# Patient Record
Sex: Female | Born: 1940
Health system: Southern US, Community
[De-identification: ages and names within clinical notes are randomized; demographics above are authoritative.]

## PROBLEM LIST (undated history)

## (undated) DIAGNOSIS — K2980 Duodenitis without bleeding: Secondary | ICD-10-CM

## (undated) DIAGNOSIS — D126 Benign neoplasm of colon, unspecified: Secondary | ICD-10-CM

## (undated) DIAGNOSIS — E8881 Metabolic syndrome: Secondary | ICD-10-CM

## (undated) DIAGNOSIS — I10 Essential (primary) hypertension: Secondary | ICD-10-CM

## (undated) DIAGNOSIS — K219 Gastro-esophageal reflux disease without esophagitis: Secondary | ICD-10-CM

## (undated) DIAGNOSIS — R0989 Other specified symptoms and signs involving the circulatory and respiratory systems: Secondary | ICD-10-CM

## (undated) DIAGNOSIS — F329 Major depressive disorder, single episode, unspecified: Secondary | ICD-10-CM

## (undated) DIAGNOSIS — N189 Chronic kidney disease, unspecified: Secondary | ICD-10-CM

## (undated) DIAGNOSIS — IMO0002 Reserved for concepts with insufficient information to code with codable children: Secondary | ICD-10-CM

## (undated) DIAGNOSIS — K635 Polyp of colon: Secondary | ICD-10-CM

## (undated) DIAGNOSIS — M8580 Other specified disorders of bone density and structure, unspecified site: Secondary | ICD-10-CM

## (undated) DIAGNOSIS — K5790 Diverticulosis of intestine, part unspecified, without perforation or abscess without bleeding: Secondary | ICD-10-CM

## (undated) DIAGNOSIS — E785 Hyperlipidemia, unspecified: Secondary | ICD-10-CM

## (undated) DIAGNOSIS — J302 Other seasonal allergic rhinitis: Secondary | ICD-10-CM

## (undated) DIAGNOSIS — H269 Unspecified cataract: Secondary | ICD-10-CM

## (undated) DIAGNOSIS — E6609 Other obesity due to excess calories: Secondary | ICD-10-CM

## (undated) DIAGNOSIS — M1990 Unspecified osteoarthritis, unspecified site: Secondary | ICD-10-CM

## (undated) DIAGNOSIS — F419 Anxiety disorder, unspecified: Secondary | ICD-10-CM

## (undated) DIAGNOSIS — T7840XA Allergy, unspecified, initial encounter: Secondary | ICD-10-CM

## (undated) DIAGNOSIS — F32A Depression, unspecified: Secondary | ICD-10-CM

## (undated) DIAGNOSIS — E119 Type 2 diabetes mellitus without complications: Secondary | ICD-10-CM

## (undated) DIAGNOSIS — K259 Gastric ulcer, unspecified as acute or chronic, without hemorrhage or perforation: Secondary | ICD-10-CM

## (undated) DIAGNOSIS — E1165 Type 2 diabetes mellitus with hyperglycemia: Secondary | ICD-10-CM

## (undated) DIAGNOSIS — D649 Anemia, unspecified: Secondary | ICD-10-CM

## (undated) HISTORY — DX: Anemia, unspecified: D64.9

## (undated) HISTORY — DX: Unspecified cataract: H26.9

## (undated) HISTORY — DX: Diverticulosis of intestine, part unspecified, without perforation or abscess without bleeding: K57.90

## (undated) HISTORY — DX: Gastro-esophageal reflux disease without esophagitis: K21.9

## (undated) HISTORY — DX: Metabolic syndrome: E88.810

## (undated) HISTORY — DX: Major depressive disorder, single episode, unspecified: F32.9

## (undated) HISTORY — DX: Essential (primary) hypertension: I10

## (undated) HISTORY — PX: HAND SURGERY: SHX662

## (undated) HISTORY — DX: Type 2 diabetes mellitus with hyperglycemia: E11.65

## (undated) HISTORY — DX: Polyp of colon: K63.5

## (undated) HISTORY — DX: Other specified symptoms and signs involving the circulatory and respiratory systems: R09.89

## (undated) HISTORY — DX: Other obesity due to excess calories: E66.09

## (undated) HISTORY — PX: COLONOSCOPY: SHX174

## (undated) HISTORY — DX: Benign neoplasm of colon, unspecified: D12.6

## (undated) HISTORY — DX: Gastric ulcer, unspecified as acute or chronic, without hemorrhage or perforation: K25.9

## (undated) HISTORY — DX: Chronic kidney disease, unspecified: N18.9

## (undated) HISTORY — DX: Hyperlipidemia, unspecified: E78.5

## (undated) HISTORY — DX: Anxiety disorder, unspecified: F41.9

## (undated) HISTORY — DX: Other specified disorders of bone density and structure, unspecified site: M85.80

## (undated) HISTORY — DX: Duodenitis without bleeding: K29.80

## (undated) HISTORY — PX: POLYPECTOMY: SHX149

## (undated) HISTORY — DX: Other seasonal allergic rhinitis: J30.2

## (undated) HISTORY — PX: TUBAL LIGATION: SHX77

## (undated) HISTORY — PX: OTHER SURGICAL HISTORY: SHX169

## (undated) HISTORY — DX: Unspecified osteoarthritis, unspecified site: M19.90

## (undated) HISTORY — DX: Metabolic syndrome: E88.81

## (undated) HISTORY — PX: DILATION AND CURETTAGE OF UTERUS: SHX78

## (undated) HISTORY — DX: Depression, unspecified: F32.A

## (undated) HISTORY — DX: Allergy, unspecified, initial encounter: T78.40XA

## (undated) HISTORY — DX: Type 2 diabetes mellitus without complications: E11.9

## (undated) HISTORY — PX: FOOT SURGERY: SHX648

## (undated) HISTORY — DX: Reserved for concepts with insufficient information to code with codable children: IMO0002

---

## 1995-03-22 HISTORY — PX: DILATION AND CURETTAGE OF UTERUS: SHX78

## 1998-07-09 ENCOUNTER — Other Ambulatory Visit: Admission: RE | Admit: 1998-07-09 | Discharge: 1998-07-09 | Payer: Self-pay | Admitting: Obstetrics and Gynecology

## 1998-09-11 ENCOUNTER — Ambulatory Visit (HOSPITAL_COMMUNITY): Admission: RE | Admit: 1998-09-11 | Discharge: 1998-09-11 | Payer: Self-pay | Admitting: Obstetrics and Gynecology

## 1999-03-31 ENCOUNTER — Encounter: Admission: RE | Admit: 1999-03-31 | Discharge: 1999-03-31 | Payer: Self-pay | Admitting: *Deleted

## 1999-03-31 ENCOUNTER — Encounter: Payer: Self-pay | Admitting: *Deleted

## 1999-04-14 ENCOUNTER — Encounter: Admission: RE | Admit: 1999-04-14 | Discharge: 1999-04-14 | Payer: Self-pay | Admitting: *Deleted

## 1999-04-14 ENCOUNTER — Encounter: Payer: Self-pay | Admitting: *Deleted

## 2000-02-02 ENCOUNTER — Encounter: Admission: RE | Admit: 2000-02-02 | Discharge: 2000-02-02 | Payer: Self-pay | Admitting: Internal Medicine

## 2000-02-02 ENCOUNTER — Encounter: Payer: Self-pay | Admitting: Internal Medicine

## 2000-05-04 ENCOUNTER — Other Ambulatory Visit: Admission: RE | Admit: 2000-05-04 | Discharge: 2000-05-04 | Payer: Self-pay | Admitting: Obstetrics and Gynecology

## 2001-06-06 ENCOUNTER — Encounter: Payer: Self-pay | Admitting: Obstetrics and Gynecology

## 2001-06-06 ENCOUNTER — Encounter: Admission: RE | Admit: 2001-06-06 | Discharge: 2001-06-06 | Payer: Self-pay | Admitting: Obstetrics and Gynecology

## 2001-06-22 ENCOUNTER — Other Ambulatory Visit: Admission: RE | Admit: 2001-06-22 | Discharge: 2001-06-22 | Payer: Self-pay | Admitting: Obstetrics and Gynecology

## 2002-06-18 ENCOUNTER — Encounter: Admission: RE | Admit: 2002-06-18 | Discharge: 2002-06-18 | Payer: Self-pay | Admitting: Obstetrics and Gynecology

## 2002-06-18 ENCOUNTER — Encounter: Payer: Self-pay | Admitting: Obstetrics and Gynecology

## 2002-11-01 ENCOUNTER — Other Ambulatory Visit: Admission: RE | Admit: 2002-11-01 | Discharge: 2002-11-01 | Payer: Self-pay | Admitting: Obstetrics and Gynecology

## 2003-11-18 ENCOUNTER — Encounter: Admission: RE | Admit: 2003-11-18 | Discharge: 2003-11-18 | Payer: Self-pay | Admitting: Obstetrics and Gynecology

## 2005-01-07 ENCOUNTER — Encounter: Admission: RE | Admit: 2005-01-07 | Discharge: 2005-01-07 | Payer: Self-pay | Admitting: Obstetrics and Gynecology

## 2006-01-27 ENCOUNTER — Encounter: Admission: RE | Admit: 2006-01-27 | Discharge: 2006-01-27 | Payer: Self-pay | Admitting: Obstetrics and Gynecology

## 2006-12-11 ENCOUNTER — Encounter: Admission: RE | Admit: 2006-12-11 | Discharge: 2006-12-11 | Payer: Self-pay | Admitting: *Deleted

## 2007-01-30 ENCOUNTER — Encounter: Admission: RE | Admit: 2007-01-30 | Discharge: 2007-01-30 | Payer: Self-pay | Admitting: Obstetrics and Gynecology

## 2008-02-04 ENCOUNTER — Encounter: Admission: RE | Admit: 2008-02-04 | Discharge: 2008-02-04 | Payer: Self-pay | Admitting: Obstetrics and Gynecology

## 2008-05-01 ENCOUNTER — Encounter: Admission: RE | Admit: 2008-05-01 | Discharge: 2008-05-01 | Payer: Self-pay | Admitting: *Deleted

## 2008-11-10 ENCOUNTER — Encounter (INDEPENDENT_AMBULATORY_CARE_PROVIDER_SITE_OTHER): Payer: Self-pay | Admitting: *Deleted

## 2008-11-25 ENCOUNTER — Ambulatory Visit: Payer: Self-pay | Admitting: Gastroenterology

## 2008-12-15 ENCOUNTER — Encounter: Payer: Self-pay | Admitting: Gastroenterology

## 2008-12-15 ENCOUNTER — Ambulatory Visit: Payer: Self-pay | Admitting: Gastroenterology

## 2008-12-16 ENCOUNTER — Encounter: Payer: Self-pay | Admitting: Gastroenterology

## 2008-12-24 ENCOUNTER — Ambulatory Visit: Payer: Self-pay | Admitting: Vascular Surgery

## 2009-01-20 ENCOUNTER — Ambulatory Visit (HOSPITAL_BASED_OUTPATIENT_CLINIC_OR_DEPARTMENT_OTHER): Admission: RE | Admit: 2009-01-20 | Discharge: 2009-01-20 | Payer: Self-pay | Admitting: Orthopedic Surgery

## 2010-02-17 ENCOUNTER — Ambulatory Visit: Payer: Self-pay | Admitting: Cardiology

## 2010-02-18 ENCOUNTER — Ambulatory Visit: Payer: Self-pay | Admitting: Cardiology

## 2010-04-11 ENCOUNTER — Encounter: Payer: Self-pay | Admitting: *Deleted

## 2010-06-08 ENCOUNTER — Other Ambulatory Visit: Payer: Self-pay | Admitting: Dermatology

## 2010-06-24 LAB — BASIC METABOLIC PANEL
CO2: 26 mEq/L (ref 19–32)
Calcium: 9.4 mg/dL (ref 8.4–10.5)
Chloride: 104 mEq/L (ref 96–112)
Creatinine, Ser: 0.84 mg/dL (ref 0.4–1.2)
GFR calc non Af Amer: >60 mL/min (ref >60)
Glucose, Bld: 133 mg/dL — ABNORMAL HIGH (ref 70–99)
Potassium: 3.9 mEq/L (ref 3.5–5.1)

## 2010-06-30 ENCOUNTER — Ambulatory Visit: Payer: Self-pay | Admitting: Cardiology

## 2010-06-30 ENCOUNTER — Encounter: Payer: Self-pay | Admitting: *Deleted

## 2010-06-30 ENCOUNTER — Other Ambulatory Visit: Payer: Self-pay | Admitting: *Deleted

## 2010-06-30 DIAGNOSIS — E7800 Pure hypercholesterolemia, unspecified: Secondary | ICD-10-CM

## 2010-07-01 ENCOUNTER — Ambulatory Visit (INDEPENDENT_AMBULATORY_CARE_PROVIDER_SITE_OTHER): Payer: Medicare Other | Admitting: Cardiology

## 2010-07-01 ENCOUNTER — Other Ambulatory Visit (INDEPENDENT_AMBULATORY_CARE_PROVIDER_SITE_OTHER): Payer: Medicare Other | Admitting: *Deleted

## 2010-07-01 ENCOUNTER — Other Ambulatory Visit (INDEPENDENT_AMBULATORY_CARE_PROVIDER_SITE_OTHER): Payer: Medicare Other | Admitting: Cardiology

## 2010-07-01 DIAGNOSIS — F411 Generalized anxiety disorder: Secondary | ICD-10-CM

## 2010-07-01 DIAGNOSIS — I119 Hypertensive heart disease without heart failure: Secondary | ICD-10-CM | POA: Insufficient documentation

## 2010-07-01 DIAGNOSIS — R21 Rash and other nonspecific skin eruption: Secondary | ICD-10-CM

## 2010-07-01 DIAGNOSIS — E785 Hyperlipidemia, unspecified: Secondary | ICD-10-CM

## 2010-07-01 DIAGNOSIS — E8881 Metabolic syndrome: Secondary | ICD-10-CM | POA: Insufficient documentation

## 2010-07-01 DIAGNOSIS — E7800 Pure hypercholesterolemia, unspecified: Secondary | ICD-10-CM

## 2010-07-01 DIAGNOSIS — F419 Anxiety disorder, unspecified: Secondary | ICD-10-CM | POA: Insufficient documentation

## 2010-07-01 LAB — LIPID PANEL
Cholesterol: 245 mg/dL — ABNORMAL HIGH (ref 0–200)
Total CHOL/HDL Ratio: 5
VLDL: 48.2 mg/dL — ABNORMAL HIGH (ref 0.0–40.0)

## 2010-07-01 LAB — BASIC METABOLIC PANEL
CO2: 26 mEq/L (ref 19–32)
Glucose, Bld: 110 mg/dL — ABNORMAL HIGH (ref 70–99)
Sodium: 144 mEq/L (ref 135–145)

## 2010-07-01 LAB — HEPATIC FUNCTION PANEL
AST: 30 U/L (ref 0–37)
Albumin: 3.8 g/dL (ref 3.5–5.2)
Alkaline Phosphatase: 105 U/L (ref 39–117)

## 2010-07-01 LAB — LDL CHOLESTEROL, DIRECT: Direct LDL: 151.9 mg/dL

## 2010-07-01 NOTE — Assessment & Plan Note (Signed)
The patient has a past history of dyslipidemia related to her exogenous obesity and her metabolic syndrome.  We are checking labs today we will call the patient with results

## 2010-07-01 NOTE — Assessment & Plan Note (Signed)
The patient admits to having a lot of anxiety.  She is requesting something to help her.  There is a large family wedding of her granddaughter coming up in June and she would like something to help her get through the excitement of the wedding.  Her daughter has asked the patient to take something to help keep control of her nerves.  We will give her a trial of alprazolam 0.25 t.i.d. P.r.n. #60 with refills

## 2010-07-01 NOTE — Progress Notes (Signed)
History of Present Illness: This 70 year old woman is seen for a scheduled followup office visit.  She has a history of exogenous obesity with metabolic syndrome and essential hypertension.  She also had a previous right carotid bruit but Doppler interrogation on 12/24/08 was negative.  The patient has not been any TIA or stroke symptoms.  Patient does not have known coronary disease.  Previously she had some atypical chest pain and we did a walking LexiScan stress test on 10/07/09 which showed no ischemia and her ejection fraction was vigorous at 81%.  Current Outpatient Prescriptions  Medication Sig Dispense Refill  . allopurinol (ZYLOPRIM) 100 MG tablet Take 100 mg by mouth daily.        . bisoprolol-hydrochlorothiazide (ZIAC) 10-6.25 MG per tablet Take 1 tablet by mouth daily.        . Calcium Carbonate-Vitamin D (CALCIUM + D) 600-200 MG-UNIT TABS Take by mouth daily.        . clobetasol (TEMOVATE) 0.05 % external solution as directed.      . fluocinonide (LIDEX) 0.05 % cream as directed.      Marland Kitchen FLUoxetine (PROZAC) 40 MG capsule Take 1 capsule by mouth Daily.      Marland Kitchen FLUOXETINE HCL, PMDD, PO Take 40 mg by mouth daily.        Marland Kitchen GARLIC PO Take by mouth.        Marland Kitchen ketoconazole (NIZORAL) 2 % cream as directed.      . Omega-3 Fatty Acids (FISH OIL) 1000 MG CAPS Take by mouth daily.        Ermalinda Memos Lecithin 1200 MG CAPS Take by mouth daily.        . Red Yeast Rice Extract (RED YEAST RICE PO) Take by mouth.          Allergies  Allergen Reactions  . Crestor (Rosuvastatin Calcium)   . Lipitor (Atorvastatin Calcium)   . Lovastatin   . Sulfonamide Derivatives     REACTION: n\T\v  . Sulfur     Patient Active Problem List  Diagnoses  . Benign hypertensive heart disease without heart failure  . Dyslipidemia  . Anxiety  . Metabolic syndrome  . Rash and nonspecific skin eruption    History  Smoking status  . Not on file  Smokeless tobacco  . Not on file    History  Alcohol Use: Not on  file    Family History  Problem Relation Age of Onset  . Colon cancer Mother   . Stroke Father   . Hypertension Father     Review of Systems: Constitutional: no fever chills diaphoresis or fatigue or change in weight.  Head and neck: no hearing loss, no epistaxis, no photophobia or visual disturbance. Respiratory: No cough, shortness of breath or wheezing. Cardiovascular: No chest pain peripheral edema, palpitations. Gastrointestinal: No abdominal distention, no abdominal pain, no change in bowel habits hematochezia or melena. Genitourinary: No dysuria, no frequency, no urgency, no nocturia. Musculoskeletal:No arthralgias, no back pain, no gait disturbance or myalgias. Neurological: No dizziness, no headaches, no numbness, no seizures, no syncope, no weakness, no tremors. Hematologic: No lymphadenopathy, no easy bruising. Psychiatric: No confusion, no hallucinations, no sleep disturbance.    Physical Exam: Filed Vitals:   07/01/10 0850  BP: 140/90  Pulse: 68  Her weight is 204, down 3 poundsPupils equal and reactive.   Extraocular Movements are full.  There is no scleral icterus.  The mouth and pharynx are normal.  The neck is supple.  The  carotids reveal no bruits.  The jugular venous pressure is normal.  The thyroid is not enlarged.  There is no lymphadenopathy. The arms legs and upper chest show evidence of recent allergic dermatitis.Pupils equal and reactive.   Extraocular Movements are full.  There is no scleral icterus.  The mouth and pharynx are normal.  The neck is supple.  The carotids reveal no bruits.  The jugular venous pressure is normal.  The thyroid is not enlarged.  There is no lymphadenopathy.The chest is clear to percussion and auscultation. There are no rales or rhonchi. Expansion of the chest is symmetrical.The precordium is quiet.  The first heart sound is normal.  The second heart sound is physiologically split.  There is no murmur gallop rub or click.  There is no  abnormal lift or heave.The abdomen is soft and nontender. Bowel sounds are normal. The liver and spleen are not enlarged. There Are no abdominal masses. There are no bruits.The pedal pulses are good.  There is no phlebitis or edema.  There is no cyanosis or clubbing.   Assessment / Plan: Trial of alprazolam 0.25 mg t.i.d. P.r.n. For excessive nervousness.  Continue other meds as is.  Recheck in 4 months for followup office visit and lab work

## 2010-07-01 NOTE — Assessment & Plan Note (Signed)
Since last visit the patient has had no new cardiac symptoms.  She is not having any chest pain or shortness of breath.  She's not having any palpitations.She does remain quite anxious.

## 2010-07-01 NOTE — Progress Notes (Signed)
Quick Note:  LDL is 151.Since she is allergic to statins she will need to work harder on diet and exercise. ______

## 2010-07-01 NOTE — Assessment & Plan Note (Signed)
The patient was recently doing some gardening and was exposed to the Leavess of the [place] treeAnd developed a severe rash on her exposed upper extremities and chest.  She saw her dermatologist who put her on Sarna Cream which has helped a lot

## 2010-07-02 ENCOUNTER — Telehealth: Payer: Self-pay | Admitting: *Deleted

## 2010-07-02 NOTE — Telephone Encounter (Signed)
Adv patient re-labs

## 2010-08-03 NOTE — Procedures (Signed)
CAROTID DUPLEX EXAM   INDICATION:  Bruit.   HISTORY:  Diabetes:  No.  Cardiac:  No.  Hypertension:  Yes.  Smoking:  Previous, 40 years prior.  Previous Surgery:  CV History:  Amaurosis Fugax No, Paresthesias No, Hemiparesis No.                                       RIGHT             LEFT  Brachial systolic pressure:         139               140  Brachial Doppler waveforms:         Triphasic         Triphasic  Vertebral direction of flow:        Antegrade         Antegrade  DUPLEX VELOCITIES (cm/sec)  CCA peak systolic                   56                55  ECA peak systolic                   44                84  ICA peak systolic                   57                55  ICA end diastolic                   20                18  PLAQUE MORPHOLOGY:                  Mixed             Mixed  PLAQUE AMOUNT:                      Mild              Mild  PLAQUE LOCATION:                    ICA               ICA    IMPRESSION:  0-29% stenosis noted in bilateral internal carotid  arteries.          ___________________________________________  Janetta Hora Fields, MD   CJ/MEDQ  D:  12/24/2008  T:  12/25/2008  Job:  [id number]

## 2010-10-27 ENCOUNTER — Other Ambulatory Visit: Payer: Self-pay | Admitting: Cardiology

## 2010-10-27 DIAGNOSIS — I119 Hypertensive heart disease without heart failure: Secondary | ICD-10-CM

## 2010-12-04 ENCOUNTER — Other Ambulatory Visit: Payer: Self-pay | Admitting: Cardiology

## 2010-12-04 DIAGNOSIS — F329 Major depressive disorder, single episode, unspecified: Secondary | ICD-10-CM

## 2011-03-29 ENCOUNTER — Other Ambulatory Visit: Payer: Self-pay | Admitting: Cardiology

## 2011-04-08 ENCOUNTER — Telehealth: Payer: Self-pay | Admitting: Cardiology

## 2011-04-08 DIAGNOSIS — I119 Hypertensive heart disease without heart failure: Secondary | ICD-10-CM

## 2011-04-08 DIAGNOSIS — E785 Hyperlipidemia, unspecified: Secondary | ICD-10-CM

## 2011-04-08 NOTE — Telephone Encounter (Signed)
New problem:  Patient would like to have lab work prior to appt on wed.

## 2011-04-08 NOTE — Telephone Encounter (Signed)
Will get labs at office visit

## 2011-04-13 ENCOUNTER — Ambulatory Visit: Payer: Medicare Other | Admitting: Cardiology

## 2011-04-14 ENCOUNTER — Other Ambulatory Visit: Payer: Medicare Other | Admitting: *Deleted

## 2011-04-14 ENCOUNTER — Encounter: Payer: Self-pay | Admitting: Cardiology

## 2011-04-14 ENCOUNTER — Ambulatory Visit (INDEPENDENT_AMBULATORY_CARE_PROVIDER_SITE_OTHER): Payer: Medicare Other | Admitting: Cardiology

## 2011-04-14 VITALS — BP 140/88 | HR 60 | Ht 61.0 in | Wt 209.0 lb

## 2011-04-14 DIAGNOSIS — I119 Hypertensive heart disease without heart failure: Secondary | ICD-10-CM

## 2011-04-14 DIAGNOSIS — E8881 Metabolic syndrome: Secondary | ICD-10-CM

## 2011-04-14 DIAGNOSIS — E785 Hyperlipidemia, unspecified: Secondary | ICD-10-CM

## 2011-04-14 DIAGNOSIS — F411 Generalized anxiety disorder: Secondary | ICD-10-CM

## 2011-04-14 DIAGNOSIS — F419 Anxiety disorder, unspecified: Secondary | ICD-10-CM

## 2011-04-14 LAB — HEPATIC FUNCTION PANEL
AST: 28 U/L (ref 0–37)
Albumin: 3.9 g/dL (ref 3.5–5.2)
Alkaline Phosphatase: 95 U/L (ref 39–117)
Total Protein: 7.1 g/dL (ref 6.0–8.3)

## 2011-04-14 LAB — LIPID PANEL
Cholesterol: 243 mg/dL — ABNORMAL HIGH (ref 0–200)
VLDL: 39.2 mg/dL (ref 0.0–40.0)

## 2011-04-14 LAB — BASIC METABOLIC PANEL
CO2: 29 mEq/L (ref 19–32)
Glucose, Bld: 95 mg/dL (ref 70–99)

## 2011-04-14 NOTE — Assessment & Plan Note (Signed)
The patient has a past history of dyslipidemia.  She has lost 5 pounds since last visit.  She is still not exercising regularly.  We're checking lab work today.

## 2011-04-14 NOTE — Assessment & Plan Note (Signed)
The patient denies any chest pain or shortness of breath.  No headache or dizziness.  No syncope.

## 2011-04-14 NOTE — Patient Instructions (Signed)
Your physician recommends that you continue on your current medications as directed. Please refer to the Current Medication list given to you today.  Your physician recommends that you return for lab work in: 4 months for fasting cholesterol  Your physician recommends that you schedule a follow-up appointment in: 4 months with Dr. Brackbill   

## 2011-04-14 NOTE — Progress Notes (Signed)
Al Decant Date of Birth:  05/03/1940 [facility] [address] [place], [place]  [address] [phone]         Fax   [phone]  History of Present Illness: This pleasant 71 year old woman is seen for a scheduled four-month followup office visit.  She has a past history of essential hypertension and history of dyslipidemia and metabolic syndrome.  She has a difficult time with weight loss.  She's had a known previous right carotid bruit with normal Dopplers in October 2010.  The patient does not have any history of ischemic heart disease and she did have a nuclear stress test on 10/07/09 which showed no ischemia and her ejection fraction was 81%.  Current Outpatient Prescriptions  Medication Sig Dispense Refill  . allopurinol (ZYLOPRIM) 100 MG tablet TAKE 1 TABLET DAILY  90 tablet  1  . bisoprolol-hydrochlorothiazide (ZIAC) 10-6.25 MG per tablet TAKE 1 TABLET DAILY  90 tablet  3  . Calcium Carbonate-Vitamin D (CALCIUM + D) 600-200 MG-UNIT TABS Take by mouth daily.        . clobetasol (TEMOVATE) 0.05 % external solution as directed.      . fluocinonide (LIDEX) 0.05 % cream as directed.      Marland Kitchen FLUoxetine (PROZAC) 40 MG capsule TAKE 1 CAPSULE DAILY  90 capsule  1  . GARLIC PO Take by mouth.        Marland Kitchen ketoconazole (NIZORAL) 2 % cream as directed.      . Omega-3 Fatty Acids (FISH OIL) 1000 MG CAPS Take by mouth daily.        . Red Yeast Rice Extract (RED YEAST RICE PO) Take by mouth.        Ermalinda Memos Lecithin 1200 MG CAPS Take by mouth daily.          Allergies  Allergen Reactions  . Crestor (Rosuvastatin Calcium)   . Lipitor (Atorvastatin Calcium)   . Lovastatin   . Sulfonamide Derivatives     REACTION: n\T\v  . Sulfur     Patient Active Problem List  Diagnoses  . Benign hypertensive heart disease without heart failure  . Dyslipidemia  . Anxiety  . Metabolic syndrome  . Rash and nonspecific skin eruption    History  Smoking status  . Former Smoker    Smokeless tobacco  . Not on file    History  Alcohol Use: Not on file    Family History  Problem Relation Age of Onset  . Colon cancer Mother   . Stroke Father   . Hypertension Father     Review of Systems: Constitutional: no fever chills diaphoresis or fatigue or change in weight.  Head and neck: no hearing loss, no epistaxis, no photophobia or visual disturbance. Respiratory: No cough, shortness of breath or wheezing. Cardiovascular: No chest pain peripheral edema, palpitations. Gastrointestinal: No abdominal distention, no abdominal pain, no change in bowel habits hematochezia or melena. Genitourinary: No dysuria, no frequency, no urgency, no nocturia. Musculoskeletal:No arthralgias, no back pain, no gait disturbance or myalgias. Neurological: No dizziness, no headaches, no numbness, no seizures, no syncope, no weakness, no tremors. Hematologic: No lymphadenopathy, no easy bruising. Psychiatric: No confusion, no hallucinations, no sleep disturbance.    Physical Exam: Filed Vitals:   04/14/11 1041  BP: 140/88  Pulse: 60   the general appearance reveals a mildly overweight woman in no acute distress.Pupils equal and reactive.   Extraocular Movements are full.  There is no scleral icterus.  The mouth and  pharynx are normal.  The neck is supple.  The carotids reveal no bruits.  The jugular venous pressure is normal.  The thyroid is not enlarged.  There is no lymphadenopathy.  The chest is clear to percussion and auscultation. There are no rales or rhonchi. Expansion of the chest is symmetrical.  The precordium is quiet.  The first heart sound is normal.  The second heart sound is physiologically split.  There is no murmur gallop rub or click.  There is no abnormal lift or heave.  The abdomen is soft and nontender. Bowel sounds are normal. The liver and spleen are not enlarged. There Are no abdominal masses. There are no bruits.  The pedal pulses are good.  There is no  phlebitis or edema.  There is no cyanosis or clubbing. Strength is normal and symmetrical in all extremities.  There is no lateralizing weakness.  There are no sensory deficits.  The skin is warm and dry.  There is no rash.    Assessment / Plan: Patient needs to work harder on weight loss and decrease portion size.  She admits that she enjoys eating.  She has been having some heartburn and uses Prevacid on a when necessary basis.  She is on allopurinol and has had no recent flareup of gout.  Continue present meds and be rechecked in 4 months for followup office visit EKG and fasting lab work.

## 2011-04-14 NOTE — Assessment & Plan Note (Signed)
Patient has a past history of anxiety.  She is on daily Prozac.  She has an excellent hand which she rarely takes.Marland Kitchen

## 2011-04-18 ENCOUNTER — Telehealth: Payer: Self-pay | Admitting: *Deleted

## 2011-04-18 NOTE — Telephone Encounter (Signed)
Message copied by Burnell Blanks on Mon Apr 18, 2011 11:46 AM ------      Message from: Cassell Clement      Created: Fri Apr 15, 2011 11:55 AM       Please report.  The labs are stable.  Continue same meds.  Continue careful diet.  Blood sugar and TGs are better. LDL is still high.  Work harder on diet/weight loss.

## 2011-04-18 NOTE — Telephone Encounter (Signed)
Advised of labs 

## 2011-05-21 ENCOUNTER — Other Ambulatory Visit: Payer: Self-pay | Admitting: Cardiology

## 2011-08-17 ENCOUNTER — Ambulatory Visit: Payer: Medicare Other | Admitting: Cardiology

## 2011-08-18 ENCOUNTER — Other Ambulatory Visit: Payer: Medicare Other

## 2011-08-18 ENCOUNTER — Ambulatory Visit: Payer: Medicare Other | Admitting: Cardiology

## 2011-09-02 ENCOUNTER — Other Ambulatory Visit: Payer: Self-pay | Admitting: Cardiology

## 2011-09-21 ENCOUNTER — Other Ambulatory Visit: Payer: Self-pay | Admitting: Cardiology

## 2011-09-21 NOTE — Telephone Encounter (Signed)
Refilled bisoprolol

## 2011-12-01 ENCOUNTER — Other Ambulatory Visit: Payer: Self-pay | Admitting: Cardiology

## 2012-07-24 ENCOUNTER — Ambulatory Visit (INDEPENDENT_AMBULATORY_CARE_PROVIDER_SITE_OTHER): Payer: Self-pay | Admitting: Cardiology

## 2012-07-24 ENCOUNTER — Encounter: Payer: Self-pay | Admitting: Cardiology

## 2012-07-24 VITALS — BP 132/78 | HR 60 | Ht 61.5 in | Wt 211.8 lb

## 2012-07-24 DIAGNOSIS — F419 Anxiety disorder, unspecified: Secondary | ICD-10-CM

## 2012-07-24 DIAGNOSIS — F411 Generalized anxiety disorder: Secondary | ICD-10-CM

## 2012-07-24 DIAGNOSIS — I119 Hypertensive heart disease without heart failure: Secondary | ICD-10-CM

## 2012-07-24 DIAGNOSIS — E7800 Pure hypercholesterolemia, unspecified: Secondary | ICD-10-CM

## 2012-07-24 DIAGNOSIS — E785 Hyperlipidemia, unspecified: Secondary | ICD-10-CM

## 2012-07-24 MED ORDER — BISOPROLOL-HYDROCHLOROTHIAZIDE 10-6.25 MG PO TABS: ORAL_TABLET | ORAL | Status: DC

## 2012-07-24 MED ORDER — ALLOPURINOL 100 MG PO TABS: ORAL_TABLET | ORAL | Status: DC

## 2012-07-24 NOTE — Patient Instructions (Signed)
Work harder on diet and weight loss  Your physician recommends that you continue on your current medications as directed. Please refer to the Current Medication list given to you today.  Your physician recommends that you schedule a follow-up appointment in: 4 months with fasting labs (lp/bmet/hfp)

## 2012-07-24 NOTE — Progress Notes (Signed)
Al Decant Date of Birth:  12-Aug-1940 [facility] [address] [place], [place]  [address] [phone]         Fax   [phone]  History of Present Illness: This pleasant 72 year old woman is seen for a scheduled four-month followup office visit. She has a past history of essential hypertension and history of dyslipidemia and metabolic syndrome. She has a difficult time with weight loss. She's had a known previous right carotid bruit with normal Dopplers in October 2010. The patient does not have any history of ischemic heart disease and she did have a nuclear stress test on 10/07/09 which showed no ischemia and her ejection fraction was 81%.  Since last visit she's had no new cardiac symptoms.  She has been under a lot of stress caring for her 33 year old husband who has a multiple. health problems.   Current Outpatient Prescriptions  Medication Sig Dispense Refill  . allopurinol (ZYLOPRIM) 100 MG tablet TAKE 1 TABLET DAILY  90 tablet  3  . bisoprolol-hydrochlorothiazide (ZIAC) 10-6.25 MG per tablet TAKE 1 TABLET DAILY  90 tablet  3  . Calcium Carbonate-Vitamin D (CALCIUM + D) 600-200 MG-UNIT TABS Take by mouth daily.        . clobetasol (TEMOVATE) 0.05 % external solution Apply 1 application topically as needed.      . fluocinonide (LIDEX) 0.05 % cream as directed.      Marland Kitchen FLUoxetine (PROZAC) 40 MG capsule TAKE 1 CAPSULE DAILY (CALL OFFICE FOR APPOINTMENT)  90 capsule  3  . ketoconazole (NIZORAL) 2 % cream as directed.      . Omega-3 Fatty Acids (FISH OIL) 1000 MG CAPS Take by mouth daily.         No current facility-administered medications for this visit.    Allergies  Allergen Reactions  . Crestor (Rosuvastatin Calcium)   . Lipitor (Atorvastatin Calcium)   . Lovastatin   . Sulfonamide Derivatives     REACTION: n\T\v  . Sulfur     Patient Active Problem List   Diagnosis Date Noted  . Benign hypertensive heart disease without heart failure  07/01/2010  . Dyslipidemia 07/01/2010  . Anxiety 07/01/2010  . Metabolic syndrome 07/01/2010  . Rash and nonspecific skin eruption 07/01/2010    History  Smoking status  . Former Smoker  Smokeless tobacco  . Not on file    History  Alcohol Use: Not on file    Family History  Problem Relation Age of Onset  . Colon cancer Mother   . Stroke Father   . Hypertension Father     Review of Systems: Constitutional: no fever chills diaphoresis or fatigue or change in weight.  Head and neck: no hearing loss, no epistaxis, no photophobia or visual disturbance. Respiratory: No cough, shortness of breath or wheezing. Cardiovascular: No chest pain peripheral edema, palpitations. Gastrointestinal: No abdominal distention, no abdominal pain, no change in bowel habits hematochezia or melena. Genitourinary: No dysuria, no frequency, no urgency, no nocturia. Musculoskeletal:No arthralgias, no back pain, no gait disturbance or myalgias. Neurological: No dizziness, no headaches, no numbness, no seizures, no syncope, no weakness, no tremors. Hematologic: No lymphadenopathy, no easy bruising. Psychiatric: No confusion, no hallucinations, no sleep disturbance.    Physical Exam: Filed Vitals:   07/24/12 1341  BP: 132/78  Pulse: 60   the general appearance reveals a somewhat overweight woman in no acute distress.The head and neck exam reveals pupils equal and reactive.  Extraocular movements are full.  There is  no scleral icterus.  The mouth and pharynx are normal.  The neck is supple.  The carotids reveal no bruits.  The jugular venous pressure is normal.  The  thyroid is not enlarged.  There is no lymphadenopathy.  The chest is clear to percussion and auscultation.  There are no rales or rhonchi.  Expansion of the chest is symmetrical.  The precordium is quiet.  The first heart sound is normal.  The second heart sound is physiologically split.  There is no murmur gallop rub or click.  There is no  abnormal lift or heave.  The abdomen is soft and nontender.  The bowel sounds are normal.  The liver and spleen are not enlarged.  There are no abdominal masses.  There are no abdominal bruits.  Extremities reveal good pedal pulses.  There is no phlebitis or edema.  There is no cyanosis or clubbing.  Strength is normal and symmetrical in all extremities.  There is no lateralizing weakness.  There are no sensory deficits.  The skin is warm and dry.  There is no rash.  EKG shows normal sinus rhythm and is within normal limits   Assessment / Plan: Continue same medication.  Recheck in 4 months for followup office visit the fasting lipid panel hepatic function panel and basal metabolic panel

## 2012-07-24 NOTE — Assessment & Plan Note (Signed)
Blood pressure was remaining stable on current therapy 

## 2012-07-24 NOTE — Assessment & Plan Note (Signed)
The patient has not been to careful with her diet.  She tends to be when she gets under stress.  She had a recent birthday party.  Her weight is up 2 pounds since last visit.  I have encouraged her to try to lose weight and increase activity

## 2012-07-24 NOTE — Assessment & Plan Note (Signed)
The patient has a history of anxiety as well as depression.  She has a small pill which she uses when necessary for anxiety but does not recall the name of it.  She is also on Prozac for depression

## 2012-08-20 ENCOUNTER — Other Ambulatory Visit: Payer: Self-pay | Admitting: *Deleted

## 2012-08-20 DIAGNOSIS — F419 Anxiety disorder, unspecified: Secondary | ICD-10-CM

## 2012-08-20 MED ORDER — ALPRAZOLAM 0.25 MG PO TABS: 0.2500 mg | ORAL_TABLET | Freq: Two times a day (BID) | ORAL | Status: DC | PRN

## 2012-08-20 NOTE — Telephone Encounter (Signed)
Patients husband recently passed away and she was requesting refills on Xanax 0.25 mg, ok per  Dr. Patty Sermons

## 2012-10-12 ENCOUNTER — Other Ambulatory Visit: Payer: Self-pay | Admitting: *Deleted

## 2012-10-12 MED ORDER — FLUOXETINE HCL 40 MG PO CAPS: ORAL_CAPSULE | ORAL | Status: DC

## 2012-11-20 ENCOUNTER — Other Ambulatory Visit (INDEPENDENT_AMBULATORY_CARE_PROVIDER_SITE_OTHER): Payer: Medicare Other

## 2012-11-20 ENCOUNTER — Other Ambulatory Visit: Payer: Self-pay | Admitting: *Deleted

## 2012-11-20 DIAGNOSIS — E785 Hyperlipidemia, unspecified: Secondary | ICD-10-CM

## 2012-11-20 LAB — BASIC METABOLIC PANEL
BUN: 20 mg/dL (ref 6–23)
Chloride: 95 mEq/L — ABNORMAL LOW (ref 96–112)
Creatinine, Ser: 1.1 mg/dL (ref 0.4–1.2)

## 2012-11-20 LAB — HEPATIC FUNCTION PANEL
Alkaline Phosphatase: 90 U/L (ref 39–117)
Bilirubin, Direct: 0.1 mg/dL (ref 0.0–0.3)
Total Protein: 7 g/dL (ref 6.0–8.3)

## 2012-11-20 LAB — LDL CHOLESTEROL, DIRECT: Direct LDL: 137.1 mg/dL

## 2012-11-20 LAB — LIPID PANEL: VLDL: 55 mg/dL — ABNORMAL HIGH (ref 0.0–40.0)

## 2012-11-20 NOTE — Progress Notes (Signed)
Quick Note:  Please make copy of labs for patient visit. ______ 

## 2012-11-23 ENCOUNTER — Encounter: Payer: Self-pay | Admitting: Cardiology

## 2012-11-23 ENCOUNTER — Ambulatory Visit (INDEPENDENT_AMBULATORY_CARE_PROVIDER_SITE_OTHER): Payer: Medicare Other | Admitting: Cardiology

## 2012-11-23 VITALS — BP 126/76 | HR 60 | Ht 61.5 in | Wt 214.0 lb

## 2012-11-23 DIAGNOSIS — F411 Generalized anxiety disorder: Secondary | ICD-10-CM

## 2012-11-23 DIAGNOSIS — E7800 Pure hypercholesterolemia, unspecified: Secondary | ICD-10-CM

## 2012-11-23 DIAGNOSIS — F419 Anxiety disorder, unspecified: Secondary | ICD-10-CM

## 2012-11-23 DIAGNOSIS — I119 Hypertensive heart disease without heart failure: Secondary | ICD-10-CM

## 2012-11-23 DIAGNOSIS — E785 Hyperlipidemia, unspecified: Secondary | ICD-10-CM

## 2012-11-23 NOTE — Patient Instructions (Signed)
Your physician recommends that you continue on your current medications as directed. Please refer to the Current Medication list given to you today.  Increase your exercise  Your physician wants you to follow-up in: 4 months with fasting labs (lp/bmet/hfp)  You will receive a reminder letter in the mail two months in advance. If you don't receive a letter, please call our office to schedule the follow-up appointment.

## 2012-11-23 NOTE — Assessment & Plan Note (Signed)
She has not been on a careful diet over the summer.  She is still in the grief process after her husband's death.  Her weight is up 3 pounds.  We reviewed her recent lab work however wishes satisfactory except for total cholesterol and LDL and triglycerides all of which are higher.  She intends to do better with weight loss and intends to get more regular exercise.  She has the opportunity to participate in the silver sneakers program.

## 2012-11-23 NOTE — Assessment & Plan Note (Signed)
The patient's level of anxiety has improved over the course of the summer and she remains on Prozac and on when necessary Xanax

## 2012-11-23 NOTE — Assessment & Plan Note (Signed)
Blood pressure was remaining stable.  She's not having any headaches dizziness or syncope.  No palpitations.

## 2012-11-23 NOTE — Progress Notes (Signed)
Al Decant Date of Birth:  07-20-1940 [facility] [address] [place], [place]  [address] [phone]         Fax   [phone]  History of Present Illness: This pleasant 72 year old woman is seen for a scheduled four-month followup office visit.  Since last visit her husband died in Aug 20, 2022 after a long illness.  She has been under a lot of stress. She has a past history of essential hypertension and history of dyslipidemia and metabolic syndrome. She has a difficult time with weight loss. She's had a known previous right carotid bruit with normal Dopplers in October 2010. The patient does not have any history of ischemic heart disease and she did have a nuclear stress test on 10/07/09 which showed no ischemia and her ejection fraction was 81%. Since last visit she's had no new cardiac symptoms.    Current Outpatient Prescriptions  Medication Sig Dispense Refill  . allopurinol (ZYLOPRIM) 100 MG tablet TAKE 1 TABLET DAILY  90 tablet  3  . ALPRAZolam (XANAX) 0.25 MG tablet Take 1 tablet (0.25 mg total) by mouth 2 (two) times daily as needed.  60 tablet  3  . bisoprolol-hydrochlorothiazide (ZIAC) 10-6.25 MG per tablet TAKE 1 TABLET DAILY  90 tablet  3  . Calcium Carbonate-Vitamin D (CALCIUM + D) 600-200 MG-UNIT TABS Take by mouth daily.        . clobetasol (TEMOVATE) 0.05 % external solution Apply 1 application topically as needed.      . fluocinonide (LIDEX) 0.05 % cream as directed.      Marland Kitchen FLUoxetine (PROZAC) 40 MG capsule TAKE 1 CAPSULE DAILY  90 capsule  3  . ketoconazole (NIZORAL) 2 % cream as directed.      . Omega-3 Fatty Acids (FISH OIL) 1000 MG CAPS Take by mouth daily.         No current facility-administered medications for this visit.    Allergies  Allergen Reactions  . Crestor [Rosuvastatin Calcium]   . Lipitor [Atorvastatin Calcium]   . Lovastatin   . Sulfonamide Derivatives     REACTION: n\T\v  . Sulfur     Patient Active Problem List   Diagnosis Date Noted  . Benign hypertensive heart disease without heart failure 07/01/2010  . Dyslipidemia 07/01/2010  . Anxiety 07/01/2010  . Metabolic syndrome 07/01/2010  . Rash and nonspecific skin eruption 07/01/2010    History  Smoking status  . Former Smoker  Smokeless tobacco  . Not on file    History  Alcohol Use: Not on file    Family History  Problem Relation Age of Onset  . Colon cancer Mother   . Stroke Father   . Hypertension Father     Review of Systems: Constitutional: no fever chills diaphoresis or fatigue or change in weight.  Head and neck: no hearing loss, no epistaxis, no photophobia or visual disturbance. Respiratory: No cough, shortness of breath or wheezing. Cardiovascular: No chest pain peripheral edema, palpitations. Gastrointestinal: No abdominal distention, no abdominal pain, no change in bowel habits hematochezia or melena. Genitourinary: No dysuria, no frequency, no urgency, no nocturia. Musculoskeletal:No arthralgias, no back pain, no gait disturbance or myalgias. Neurological: No dizziness, no headaches, no numbness, no seizures, no syncope, no weakness, no tremors. Hematologic: No lymphadenopathy, no easy bruising. Psychiatric: No confusion, no hallucinations, no sleep disturbance.    Physical Exam: Filed Vitals:   11/23/12 1355  BP: 126/76  Pulse: 60   the general appearance reveals a well-developed  well-nourished somewhat overweight woman in no acute distress.The head and neck exam reveals pupils equal and reactive.  Extraocular movements are full.  There is no scleral icterus.  The mouth and pharynx are normal.  The neck is supple.  The carotids reveal no bruits.  The jugular venous pressure is normal.  The  thyroid is not enlarged.  There is no lymphadenopathy.  The chest is clear to percussion and auscultation.  There are no rales or rhonchi.  Expansion of the chest is symmetrical.  The precordium is quiet.  The first heart sound is  normal.  The second heart sound is physiologically split.  There is no murmur gallop rub or click.  There is no abnormal lift or heave.  The abdomen is soft and nontender.  The bowel sounds are normal.  The liver and spleen are not enlarged.  There are no abdominal masses.  There are no abdominal bruits.  Extremities reveal good pedal pulses.  There is no phlebitis or edema.  There is no cyanosis or clubbing.  Strength is normal and symmetrical in all extremities.  There is no lateralizing weakness.  There are no sensory deficits.  The skin is warm and dry.  There is no rash.     Assessment / Plan: The patient is to continue same medication.  Work harder on diet and exercise.  Recheck in 4 months for office visit panel hepatic function panel and basal metabolic panel.

## 2013-02-12 ENCOUNTER — Other Ambulatory Visit: Payer: Self-pay | Admitting: Cardiology

## 2013-02-15 ENCOUNTER — Other Ambulatory Visit (INDEPENDENT_AMBULATORY_CARE_PROVIDER_SITE_OTHER): Payer: Medicare Other

## 2013-02-15 ENCOUNTER — Ambulatory Visit (INDEPENDENT_AMBULATORY_CARE_PROVIDER_SITE_OTHER): Payer: Medicare Other | Admitting: Cardiology

## 2013-02-15 VITALS — BP 130/90 | HR 58 | Ht 61.0 in | Wt 213.1 lb

## 2013-02-15 DIAGNOSIS — E8881 Metabolic syndrome: Secondary | ICD-10-CM

## 2013-02-15 DIAGNOSIS — I119 Hypertensive heart disease without heart failure: Secondary | ICD-10-CM

## 2013-02-15 DIAGNOSIS — M5430 Sciatica, unspecified side: Secondary | ICD-10-CM

## 2013-02-15 DIAGNOSIS — E785 Hyperlipidemia, unspecified: Secondary | ICD-10-CM

## 2013-02-15 DIAGNOSIS — M5442 Lumbago with sciatica, left side: Secondary | ICD-10-CM | POA: Insufficient documentation

## 2013-02-15 DIAGNOSIS — E7800 Pure hypercholesterolemia, unspecified: Secondary | ICD-10-CM

## 2013-02-15 LAB — HEPATIC FUNCTION PANEL
ALT: 34 U/L (ref 0–35)
AST: 25 U/L (ref 0–37)
Albumin: 3.7 g/dL (ref 3.5–5.2)
Alkaline Phosphatase: 88 U/L (ref 39–117)

## 2013-02-15 LAB — LIPID PANEL
Cholesterol: 207 mg/dL — ABNORMAL HIGH (ref 0–200)
Total CHOL/HDL Ratio: 5

## 2013-02-15 LAB — BASIC METABOLIC PANEL
CO2: 29 mEq/L (ref 19–32)
Calcium: 9.4 mg/dL (ref 8.4–10.5)
Creatinine, Ser: 1 mg/dL (ref 0.4–1.2)
GFR: 59.57 mL/min — ABNORMAL LOW (ref >60.00)

## 2013-02-15 LAB — LDL CHOLESTEROL, DIRECT: Direct LDL: 136.7 mg/dL

## 2013-02-15 NOTE — Progress Notes (Signed)
Al Decant Date of Birth:  [birth year] [address] [place], [place]  [address] [phone]         Fax   [phone]  History of Present Illness: This pleasant 72 year old woman is seen for a work in office visit.  She comes in because of pain in her back radiating down her left buttock and left thigh.  Years ago she was told that she had a bulging disc in her back.  She has seen Dr. Shon Baton in the past.  Her recent symptoms started about a month ago after she fell and landed on her left hip.  She has not had any x-rays nor seen anyone about these symptoms since her accident.  Her husband died in 2022-08-12 after a long illness.  She has been under a lot of stress. She has a past history of essential hypertension and history of dyslipidemia and metabolic syndrome. She has a difficult time with weight loss. She's had a known previous right carotid bruit with normal Dopplers in October 2010. The patient does not have any history of ischemic heart disease and she did have a nuclear stress test on 10/07/09 which showed no ischemia and her ejection fraction was 81%. Since last visit she's had no new cardiac symptoms.    Current Outpatient Prescriptions  Medication Sig Dispense Refill  . allopurinol (ZYLOPRIM) 100 MG tablet TAKE 1 TABLET DAILY  90 tablet  3  . ALPRAZolam (XANAX) 0.25 MG tablet TAKE 1 TABLET BY MOUTH TWICE DAILY AS NEEDED  60 tablet  0  . bisoprolol-hydrochlorothiazide (ZIAC) 10-6.25 MG per tablet TAKE 1 TABLET DAILY  90 tablet  3  . Calcium Carbonate-Vitamin D (CALCIUM + D) 600-200 MG-UNIT TABS Take by mouth daily.        . clobetasol (TEMOVATE) 0.05 % external solution Apply 1 application topically as needed.      . fluocinonide (LIDEX) 0.05 % cream as directed.      Marland Kitchen FLUoxetine (PROZAC) 40 MG capsule TAKE 1 CAPSULE DAILY  90 capsule  3  . ketoconazole (NIZORAL) 2 % cream as directed.      . Omega-3 Fatty Acids (FISH OIL) 1000 MG CAPS Take by mouth daily.          No current facility-administered medications for this visit.    Allergies  Allergen Reactions  . Crestor [Rosuvastatin Calcium]   . Lipitor [Atorvastatin Calcium]   . Lovastatin   . Sulfonamide Derivatives     REACTION: n\T\v  . Sulfur     Patient Active Problem List   Diagnosis Date Noted  . Low back pain on left side with sciatica 02/15/2013  . Benign hypertensive heart disease without heart failure 07/01/2010  . Dyslipidemia 07/01/2010  . Anxiety 07/01/2010  . Metabolic syndrome 07/01/2010  . Rash and nonspecific skin eruption 07/01/2010    History  Smoking status  . Former Smoker  Smokeless tobacco  . Not on file    History  Alcohol Use: Not on file    Family History  Problem Relation Age of Onset  . Colon cancer Mother   . Stroke Father   . Hypertension Father     Review of Systems: Constitutional: no fever chills diaphoresis or fatigue or change in weight.  Head and neck: no hearing loss, no epistaxis, no photophobia or visual disturbance. Respiratory: No cough, shortness of breath or wheezing. Cardiovascular: No chest pain peripheral edema, palpitations. Gastrointestinal: No abdominal distention, no abdominal pain, no change in  bowel habits hematochezia or melena. Genitourinary: No dysuria, no frequency, no urgency, no nocturia. Musculoskeletal:No arthralgias, no back pain, no gait disturbance or myalgias. Neurological: No dizziness, no headaches, no numbness, no seizures, no syncope, no weakness, no tremors. Hematologic: No lymphadenopathy, no easy bruising. Psychiatric: No confusion, no hallucinations, no sleep disturbance.    Physical Exam: Filed Vitals:   02/15/13 1056  BP: 130/90  Pulse: 58   the general appearance reveals a well-developed well-nourished somewhat overweight woman in no acute distress.The head and neck exam reveals pupils equal and reactive.  Extraocular movements are full.  There is no scleral icterus.  The mouth and pharynx  are normal.  The neck is supple.  The carotids reveal no bruits.  The jugular venous pressure is normal.  The  thyroid is not enlarged.  There is no lymphadenopathy.  The chest is clear to percussion and auscultation.  There are no rales or rhonchi.  Expansion of the chest is symmetrical.  The precordium is quiet.  The first heart sound is normal.  The second heart sound is physiologically split.  There is no murmur gallop rub or click.  There is no abnormal lift or heave.  The abdomen is soft and nontender.  The bowel sounds are normal.  The liver and spleen are not enlarged.  There are no abdominal masses.  There are no abdominal bruits.  Extremities reveal good pedal pulses.  There is no phlebitis or edema.  There is no cyanosis or clubbing.  Strength is normal and symmetrical in all extremities.  There is no lateralizing weakness.  There are no sensory deficits.  The skin is warm and dry.  There is no rash.     Assessment / Plan: The patient is to continue same medication.  For her orthopedic and low back symptoms we will refer her to Dr. Shon Baton who has seen her in the past.  Recheck here in 4 months for office visit lipid panel hepatic function panel and basal metabolic panel

## 2013-02-15 NOTE — Patient Instructions (Signed)
Your physician recommends that you continue on your current medications as directed. Please refer to the Current Medication list given to you today.  Your physician recommends that you schedule a follow-up appointment in: 4 months with fasting labs (lp/bmet/hfp)   Call Dr Shon Baton for an appointment phone number [phone] on Monday. If you have any problems getting an appointment let us know.

## 2013-02-15 NOTE — Assessment & Plan Note (Signed)
Patient has been able to lose 1 pound since last visit

## 2013-02-15 NOTE — Assessment & Plan Note (Signed)
After a month she is still having a lot of discomfort in her low back and left leg.  We will refer her back to her orthopedist Dr. Shon Baton for further evaluation and treatment.. for the time being she will continue taking her Aleve as necessary

## 2013-02-15 NOTE — Assessment & Plan Note (Signed)
Blood pressure was remaining stable on current therapy 

## 2013-02-17 NOTE — Progress Notes (Signed)
Quick Note:  Please report to patient. The recent labs are stable. Continue same medication and careful diet. Lipids are slightly better. BS better. ______

## 2013-02-20 ENCOUNTER — Telehealth: Payer: Self-pay | Admitting: *Deleted

## 2013-02-20 NOTE — Telephone Encounter (Signed)
Advised patient of lab results  

## 2013-02-20 NOTE — Telephone Encounter (Signed)
Message copied by Burnell Blanks on Wed Feb 20, 2013  5:19 PM ------      Message from: Cassell Clement      Created: Sun Feb 17, 2013  6:39 PM       Please report to patient.  The recent labs are stable. Continue same medication and careful diet. Lipids are slightly better. BS better. ------

## 2013-03-29 ENCOUNTER — Other Ambulatory Visit: Payer: Self-pay | Admitting: Cardiology

## 2013-03-29 DIAGNOSIS — F419 Anxiety disorder, unspecified: Secondary | ICD-10-CM

## 2013-06-14 ENCOUNTER — Encounter: Payer: Self-pay | Admitting: Cardiology

## 2013-06-14 ENCOUNTER — Other Ambulatory Visit (INDEPENDENT_AMBULATORY_CARE_PROVIDER_SITE_OTHER): Payer: Medicare Other

## 2013-06-14 ENCOUNTER — Ambulatory Visit (INDEPENDENT_AMBULATORY_CARE_PROVIDER_SITE_OTHER): Payer: Medicare Other | Admitting: Cardiology

## 2013-06-14 VITALS — BP 156/70 | HR 79 | Ht 61.5 in | Wt 214.0 lb

## 2013-06-14 DIAGNOSIS — E785 Hyperlipidemia, unspecified: Secondary | ICD-10-CM

## 2013-06-14 DIAGNOSIS — E8881 Metabolic syndrome: Secondary | ICD-10-CM

## 2013-06-14 DIAGNOSIS — I119 Hypertensive heart disease without heart failure: Secondary | ICD-10-CM

## 2013-06-14 DIAGNOSIS — M109 Gout, unspecified: Secondary | ICD-10-CM

## 2013-06-14 LAB — HEPATIC FUNCTION PANEL
ALK PHOS: 87 U/L (ref 39–117)
ALT: 36 U/L — ABNORMAL HIGH (ref 0–35)
AST: 29 U/L (ref 0–37)
Albumin: 4 g/dL (ref 3.5–5.2)
BILIRUBIN DIRECT: 0 mg/dL (ref 0.0–0.3)
Total Bilirubin: 0.7 mg/dL (ref 0.3–1.2)
Total Protein: 7 g/dL (ref 6.0–8.3)

## 2013-06-14 LAB — BASIC METABOLIC PANEL
BUN: 20 mg/dL (ref 6–23)
CHLORIDE: 101 meq/L (ref 96–112)
CO2: 28 meq/L (ref 19–32)
CREATININE: 1 mg/dL (ref 0.4–1.2)
Calcium: 9.3 mg/dL (ref 8.4–10.5)
GFR: 58.82 mL/min — ABNORMAL LOW (ref >60.00)
Glucose, Bld: 124 mg/dL — ABNORMAL HIGH (ref 70–99)
Potassium: 4.4 mEq/L (ref 3.5–5.1)
Sodium: 137 mEq/L (ref 135–145)

## 2013-06-14 LAB — LIPID PANEL
CHOL/HDL RATIO: 4
Cholesterol: 224 mg/dL — ABNORMAL HIGH (ref 0–200)
HDL: 50 mg/dL (ref >39.00)
LDL Cholesterol: 145 mg/dL — ABNORMAL HIGH (ref 0–99)
Triglycerides: 145 mg/dL (ref 0.0–149.0)
VLDL: 29 mg/dL (ref 0.0–40.0)

## 2013-06-14 LAB — URIC ACID: URIC ACID, SERUM: 8.2 mg/dL — AB (ref 2.4–7.0)

## 2013-06-14 NOTE — Progress Notes (Signed)
Brittany Mcclure Date of Birth:  02-17-1941 [address] [place], [place]  [address] [phone]         Fax   [phone]  History of Present Illness: This pleasant 73 year old woman is seen for a scheduled followup office visit.  . She has a past history of essential hypertension and history of dyslipidemia and metabolic syndrome. She has a difficult time with weight loss. She's had a known previous right carotid bruit with normal Dopplers in October 2010. The patient does not have any history of ischemic heart disease and she did have a nuclear stress test on 10/07/09 which showed no ischemia and her ejection fraction was 81%. Since last visit she's had no new cardiac symptoms.  Her blood pressure was slightly high today but she did not take any of her morning medications.  Since last visit her back pain has resolved.  She intends to get more regular exercise.   Current Outpatient Prescriptions  Medication Sig Dispense Refill  . allopurinol (ZYLOPRIM) 100 MG tablet 100 mg as directed. TAKE 2 DAILY      . ALPRAZolam (XANAX) 0.25 MG tablet TAKE 1 TABLET BY MOUTH TWICE DAILY AS NEEDED  60 tablet  2  . bisoprolol-hydrochlorothiazide (ZIAC) 10-6.25 MG per tablet TAKE 1 TABLET DAILY  90 tablet  3  . Calcium Carbonate-Vitamin D (CALCIUM + D) 600-200 MG-UNIT TABS Take by mouth daily.        . clobetasol (TEMOVATE) 0.05 % external solution Apply 1 application topically as needed.      . fluocinonide (LIDEX) 0.05 % cream as directed.      Marland Kitchen FLUoxetine (PROZAC) 40 MG capsule TAKE 1 CAPSULE DAILY  90 capsule  3  . ketoconazole (NIZORAL) 2 % cream as directed.      . Omega-3 Fatty Acids (FISH OIL) 1000 MG CAPS Take by mouth daily.         No current facility-administered medications for this visit.    Allergies  Allergen Reactions  . Crestor [Rosuvastatin Calcium]   . Lipitor [Atorvastatin Calcium]   . Lovastatin   . Sulfonamide Derivatives     REACTION: n\T\v  . Sulfur      Patient Active Problem List   Diagnosis Date Noted  . Low back pain on left side with sciatica 02/15/2013  . Benign hypertensive heart disease without heart failure 07/01/2010  . Dyslipidemia 07/01/2010  . Anxiety 07/01/2010  . Metabolic syndrome 81/82/9937  . Rash and nonspecific skin eruption 07/01/2010    History  Smoking status  . Former Smoker  Smokeless tobacco  . Not on file    History  Alcohol Use: Not on file    Family History  Problem Relation Age of Onset  . Colon cancer Mother   . Stroke Father   . Hypertension Father     Review of Systems: Constitutional: no fever chills diaphoresis or fatigue or change in weight.  Head and neck: no hearing loss, no epistaxis, no photophobia or visual disturbance. Respiratory: No cough, shortness of breath or wheezing. Cardiovascular: No chest pain peripheral edema, palpitations. Gastrointestinal: No abdominal distention, no abdominal pain, no change in bowel habits hematochezia or melena. Genitourinary: No dysuria, no frequency, no urgency, no nocturia. Musculoskeletal:No arthralgias, no back pain, no gait disturbance or myalgias. Neurological: No dizziness, no headaches, no numbness, no seizures, no syncope, no weakness, no tremors. Hematologic: No lymphadenopathy, no easy bruising. Psychiatric: No confusion, no hallucinations, no sleep disturbance.    Physical  Exam: Filed Vitals:   06/14/13 0902  BP: 156/70  Pulse: 79   the general appearance reveals a well-developed well-nourished somewhat overweight woman in no acute distress.The head and neck exam reveals pupils equal and reactive.  Extraocular movements are full.  There is no scleral icterus.  The mouth and pharynx are normal.  The neck is supple.  The carotids reveal no bruits.  The jugular venous pressure is normal.  The  thyroid is not enlarged.  There is no lymphadenopathy.  The chest is clear to percussion and auscultation.  There are no rales or rhonchi.   Expansion of the chest is symmetrical.  The precordium is quiet.  The first heart sound is normal.  The second heart sound is physiologically split.  There is no murmur gallop rub or click.  There is no abnormal lift or heave.  The abdomen is soft and nontender.  The bowel sounds are normal.  The liver and spleen are not enlarged.  There are no abdominal masses.  There are no abdominal bruits.  Extremities reveal good pedal pulses.  There is no phlebitis or edema.  There is no cyanosis or clubbing.  Strength is normal and symmetrical in all extremities.  There is no lateralizing weakness.  There are no sensory deficits.  The skin is warm and dry.  There is no rash.     Assessment / Plan: Continue same medication.  Await results of today's labs.  She is having questionable gouty arthritis in her right ring finger and we will increase her allopurinol up to 200 mg a day.  She will work harder on weight loss. Recheck in 4 months for office visit lipid panel hepatic function panel and basal metabolic panel.

## 2013-06-14 NOTE — Assessment & Plan Note (Signed)
The patient has gained another pound since last visit.  We talked again about the importance of weight loss and regular aerobic exercise.

## 2013-06-14 NOTE — Patient Instructions (Signed)
Will obtain labs today and call you with the results (lp/bmet/hfp)  INCREASE ALLOPURINOL TO 100 MG 2 DAILY  Work harder on diet and weight loss  Your physician wants you to follow-up in: 4 months with fasting labs (lp/bmet/hfp)  You will receive a reminder letter in the mail two months in advance. If you don't receive a letter, please call our office to schedule the follow-up appointment.

## 2013-06-14 NOTE — Assessment & Plan Note (Signed)
We are checking lab work today.  Continue same medication for now.

## 2013-06-14 NOTE — Assessment & Plan Note (Signed)
Patient has not been experiencing any symptoms of CHF.  No chest pain.  No palpitations.

## 2013-06-17 ENCOUNTER — Telehealth: Payer: Self-pay | Admitting: *Deleted

## 2013-06-17 ENCOUNTER — Other Ambulatory Visit: Payer: Medicare Other

## 2013-06-17 DIAGNOSIS — E7800 Pure hypercholesterolemia, unspecified: Secondary | ICD-10-CM

## 2013-06-17 MED ORDER — EZETIMIBE 10 MG PO TABS: 10.0000 mg | ORAL_TABLET | Freq: Every day | ORAL | Status: DC

## 2013-06-17 NOTE — Telephone Encounter (Signed)
Advised patient of lab results and new Rx. Patient is willing to try Zetia and will call back if unable to tolerate. No samples available for patient

## 2013-06-17 NOTE — Telephone Encounter (Signed)
Message copied by Earvin Hansen on Mon Jun 17, 2013 10:40 AM ------      Message from: Darlin Coco      Created: Fri Jun 14, 2013  7:21 PM       The cholesterol and LDL are too high.  I would like her to try ezetimibe 10 mg one daily.  She is intolerant of all the statins.      Her uric acid level was elevated today at 8.2.  This should improve on a higher dose of allopurinol and will help her gout.      Blood sugar is still too high.  Work harder on diet and exercise and weight loss. ------

## 2013-09-20 ENCOUNTER — Other Ambulatory Visit: Payer: Self-pay | Admitting: Cardiology

## 2013-10-13 ENCOUNTER — Other Ambulatory Visit: Payer: Self-pay | Admitting: Cardiology

## 2013-10-14 ENCOUNTER — Encounter: Payer: Self-pay | Admitting: Cardiology

## 2013-10-14 ENCOUNTER — Ambulatory Visit (INDEPENDENT_AMBULATORY_CARE_PROVIDER_SITE_OTHER): Payer: Medicare Other | Admitting: Cardiology

## 2013-10-14 VITALS — BP 152/62 | HR 58 | Ht 61.5 in | Wt 215.0 lb

## 2013-10-14 DIAGNOSIS — I119 Hypertensive heart disease without heart failure: Secondary | ICD-10-CM

## 2013-10-14 DIAGNOSIS — M1A9XX0 Chronic gout, unspecified, without tophus (tophi): Secondary | ICD-10-CM | POA: Insufficient documentation

## 2013-10-14 DIAGNOSIS — M10441 Other secondary gout, right hand: Secondary | ICD-10-CM

## 2013-10-14 DIAGNOSIS — E7800 Pure hypercholesterolemia, unspecified: Secondary | ICD-10-CM

## 2013-10-14 DIAGNOSIS — M109 Gout, unspecified: Secondary | ICD-10-CM

## 2013-10-14 DIAGNOSIS — E785 Hyperlipidemia, unspecified: Secondary | ICD-10-CM

## 2013-10-14 LAB — HEPATIC FUNCTION PANEL
ALBUMIN: 3.6 g/dL (ref 3.5–5.2)
ALK PHOS: 90 U/L (ref 39–117)
ALT: 42 U/L — AB (ref 0–35)
AST: 34 U/L (ref 0–37)
Bilirubin, Direct: 0.1 mg/dL (ref 0.0–0.3)
TOTAL PROTEIN: 7 g/dL (ref 6.0–8.3)
Total Bilirubin: 0.7 mg/dL (ref 0.2–1.2)

## 2013-10-14 LAB — LIPID PANEL
CHOLESTEROL: 220 mg/dL — AB (ref 0–200)
HDL: 49.2 mg/dL (ref >39.00)
LDL CALC: 135 mg/dL — AB (ref 0–99)
NonHDL: 170.8
TRIGLYCERIDES: 177 mg/dL — AB (ref 0.0–149.0)
Total CHOL/HDL Ratio: 4
VLDL: 35.4 mg/dL (ref 0.0–40.0)

## 2013-10-14 LAB — BASIC METABOLIC PANEL
BUN: 23 mg/dL (ref 6–23)
CHLORIDE: 101 meq/L (ref 96–112)
CO2: 26 mEq/L (ref 19–32)
CREATININE: 1.1 mg/dL (ref 0.4–1.2)
Calcium: 9.1 mg/dL (ref 8.4–10.5)
GFR: 52.53 mL/min — AB (ref >60.00)
Glucose, Bld: 149 mg/dL — ABNORMAL HIGH (ref 70–99)
Potassium: 4.3 mEq/L (ref 3.5–5.1)
Sodium: 135 mEq/L (ref 135–145)

## 2013-10-14 NOTE — Assessment & Plan Note (Signed)
She has a past history of dyslipidemia.  We're checking fasting lab work today.  She has gained 1 pound since last visit.  I encouraged her to work harder on weight loss and exercise.

## 2013-10-14 NOTE — Progress Notes (Signed)
Quick Note:  Please report to patient. The recent labs are stable. Continue same medication and careful diet.LDL slightly better. Mild elevation of LFTs from fatty liver. Watch diet, lose weight ______

## 2013-10-14 NOTE — Progress Notes (Signed)
Brittany Mcclure Date of Birth:  11/09/1940 [facility] [address] [place], [place]  [address] [phone]        Fax   [phone]   History of Present Illness: This pleasant 73 year old woman is seen for a scheduled followup office visit. . She has a past history of essential hypertension and history of dyslipidemia and metabolic syndrome. She has a difficult time with weight loss. She's had a known previous right carotid bruit with normal Dopplers in October 2010. The patient does not have any history of ischemic heart disease and she did have a nuclear stress test on 10/07/09 which showed no ischemia and her ejection fraction was 81%. Since last visit she's had no new cardiac symptoms. Her blood pressure was slightly high today but she did not take any of her morning medications.  She intends to get more regular exercise.  She and her friends and 10 to join silver sneakers program.  Since last visit the patient has had some problems with gout in her right hand diagnosed by Dr. Fredna Dow her Copy.  Current Outpatient Prescriptions  Medication Sig Dispense Refill  . allopurinol (ZYLOPRIM) 100 MG tablet Take 2 tablets (200 mg total) by mouth daily.  180 tablet  0  . ALPRAZolam (XANAX) 0.25 MG tablet TAKE 1 TABLET BY MOUTH TWICE DAILY AS NEEDED  60 tablet  2  . bisoprolol-hydrochlorothiazide (ZIAC) 10-6.25 MG per tablet TAKE 1 TABLET BY MOUTH DAILY  90 tablet  0  . Cholecalciferol (D3-1000) 1000 UNITS capsule Take 1,000 Units by mouth daily.      . clobetasol (TEMOVATE) 0.05 % external solution Apply 1 application topically as needed.      . Cyanocobalamin (VITAMIN B 12 PO) Take by mouth.      . ezetimibe (ZETIA) 10 MG tablet Take 1 tablet (10 mg total) by mouth daily.  30 tablet  3  . fluocinonide (LIDEX) 0.05 % cream as directed.      Marland Kitchen FLUoxetine (PROZAC) 40 MG capsule TAKE 1 CAPSULE BY MOUTH DAILY  90 capsule  3  . ketoconazole (NIZORAL) 2 % cream as  directed.      . meloxicam (MOBIC) 15 MG tablet Take 15 mg by mouth daily.       . Omega-3 Fatty Acids (FISH OIL) 1000 MG CAPS Take by mouth daily.         No current facility-administered medications for this visit.    Allergies  Allergen Reactions  . Crestor [Rosuvastatin Calcium]   . Lipitor [Atorvastatin Calcium]   . Lovastatin   . Sulfonamide Derivatives     REACTION: n\T\v  . Sulfur     Patient Active Problem List   Diagnosis Date Noted  . Low back pain on left side with sciatica 02/15/2013  . Benign hypertensive heart disease without heart failure 07/01/2010  . Dyslipidemia 07/01/2010  . Anxiety 07/01/2010  . Metabolic syndrome 77/41/2878  . Rash and nonspecific skin eruption 07/01/2010    History  Smoking status  . Former Smoker  Smokeless tobacco  . Not on file    History  Alcohol Use: Not on file    Family History  Problem Relation Age of Onset  . Colon cancer Mother   . Stroke Father   . Hypertension Father     Review of Systems: Constitutional: no fever chills diaphoresis or fatigue or change in weight.  Head and neck: no hearing loss, no epistaxis, no photophobia or visual  disturbance. Respiratory: No cough, shortness of breath or wheezing. Cardiovascular: No chest pain peripheral edema, palpitations. Gastrointestinal: No abdominal distention, no abdominal pain, no change in bowel habits hematochezia or melena. Genitourinary: No dysuria, no frequency, no urgency, no nocturia. Musculoskeletal:No arthralgias, no back pain, no gait disturbance or myalgias. Neurological: No dizziness, no headaches, no numbness, no seizures, no syncope, no weakness, no tremors. Hematologic: No lymphadenopathy, no easy bruising. Psychiatric: No confusion, no hallucinations, no sleep disturbance.    Physical Exam: Filed Vitals:   10/14/13 1029  BP: 152/62  Pulse: 58   the general appearance reveals a pleasant mildly overweight woman in no acute distress.The head  and neck exam reveals pupils equal and reactive.  Extraocular movements are full.  There is no scleral icterus.  The mouth and pharynx are normal.  The neck is supple.  The carotids reveal faint right carotid bruit.  The jugular venous pressure is normal.  The  thyroid is not enlarged.  There is no lymphadenopathy.  The chest is clear to percussion and auscultation.  There are no rales or rhonchi.  Expansion of the chest is symmetrical.  The precordium is quiet.  The first heart sound is normal.  The second heart sound is physiologically split.  There is no murmur gallop rub or click.  There is no abnormal lift or heave.  The abdomen is soft and nontender.  The bowel sounds are normal.  The liver and spleen are not enlarged.  There are no abdominal masses.  There are no abdominal bruits.  Extremities reveal good pedal pulses.  There is no phlebitis or edema.  There is no cyanosis or clubbing.  Strength is normal and symmetrical in all extremities.  There is no lateralizing weakness.  There are no sensory deficits.  The skin is warm and dry.  There is no rash.  She does have some thickening of the fingers of the right hand suggesting possible mild tophaceous gout.     Assessment / Plan: 1. essential hypertension without heart failure 2. metabolic syndrome 3. Dyslipidemia 4. right carotid bruit with normal Dopplers 2010 5. hyperuricemia with history of gout right hand  Disposition: Continue same medication.  We're checking labs today.  Work harder on weight loss.   join silver sneakers exercise program.  Return in 4 months for office visit lipid panel basal metabolic panel hepatic function panel and uric acid

## 2013-10-14 NOTE — Assessment & Plan Note (Signed)
She has a past history of gout in her right hand.  She has been taking the allopurinol only once a day.  We want her to increase it to 2 tablets a day.  We will plan to recheck a uric acid at her next visit.

## 2013-10-14 NOTE — Patient Instructions (Addendum)
Will obtain labs today and call you with the results (LP/BMET/HFP)  INCREASE YOUR ALLOPURINOL TO 100 MG 2 DAILY  Your physician recommends that you schedule a follow-up appointment in: 4 months with fasting labs (lp/bmet/hfp)   WORK HARDER ON DIET AND WEIGHT LOSS

## 2013-10-14 NOTE — Assessment & Plan Note (Signed)
The patient has not been having he chest pain.  She does have exertional dyspnea which is chronic.  She has not had any racing of her heart.

## 2013-10-15 ENCOUNTER — Other Ambulatory Visit: Payer: Self-pay | Admitting: *Deleted

## 2013-10-15 DIAGNOSIS — Z79899 Other long term (current) drug therapy: Secondary | ICD-10-CM

## 2013-11-14 ENCOUNTER — Other Ambulatory Visit: Payer: Self-pay | Admitting: Cardiology

## 2013-11-14 DIAGNOSIS — F419 Anxiety disorder, unspecified: Secondary | ICD-10-CM

## 2013-11-19 ENCOUNTER — Encounter: Payer: Self-pay | Admitting: Internal Medicine

## 2014-01-06 ENCOUNTER — Telehealth: Payer: Self-pay | Admitting: Cardiology

## 2014-01-06 ENCOUNTER — Other Ambulatory Visit: Payer: Self-pay

## 2014-01-06 NOTE — Telephone Encounter (Signed)
Patient would like order faxed for mammogram. Patient has found lump on her left breast. Will fax order as requested

## 2014-01-06 NOTE — Telephone Encounter (Signed)
New Message      Pt calling wanting a referral [facility] for a [facility]. Please call pt back and advise.

## 2014-01-08 ENCOUNTER — Other Ambulatory Visit: Payer: Self-pay | Admitting: Cardiology

## 2014-01-08 DIAGNOSIS — N63 Unspecified lump in unspecified breast: Secondary | ICD-10-CM

## 2014-01-08 NOTE — Telephone Encounter (Signed)
Scheduled for Monday November 2 at 8:00 am  Advised patient of appointment date and time

## 2014-01-20 ENCOUNTER — Ambulatory Visit
Admission: RE | Admit: 2014-01-20 | Discharge: 2014-01-20 | Disposition: A | Discharge status: Home / Self Care | Payer: Medicare Other | Source: Ambulatory Visit | Attending: Cardiology | Admitting: Cardiology

## 2014-01-20 ENCOUNTER — Encounter (INDEPENDENT_AMBULATORY_CARE_PROVIDER_SITE_OTHER): Payer: Self-pay

## 2014-01-20 ENCOUNTER — Other Ambulatory Visit: Payer: Self-pay | Admitting: Cardiology

## 2014-01-20 DIAGNOSIS — N63 Unspecified lump in unspecified breast: Secondary | ICD-10-CM

## 2014-01-28 ENCOUNTER — Encounter: Payer: Self-pay | Admitting: Internal Medicine

## 2014-02-10 ENCOUNTER — Other Ambulatory Visit (INDEPENDENT_AMBULATORY_CARE_PROVIDER_SITE_OTHER): Payer: Medicare Other | Admitting: *Deleted

## 2014-02-10 DIAGNOSIS — Z79899 Other long term (current) drug therapy: Secondary | ICD-10-CM

## 2014-02-10 LAB — URIC ACID: Uric Acid, Serum: 5.3 mg/dL (ref 2.4–7.0)

## 2014-02-11 ENCOUNTER — Telehealth: Payer: Self-pay | Admitting: Cardiology

## 2014-02-11 NOTE — Telephone Encounter (Signed)
New Msg    Patient is returning call. Please contact at [phone]

## 2014-02-11 NOTE — Telephone Encounter (Signed)
Spoke with pt and reviewed uric acid lab results and information from Dr. Mare Ferrari with her.

## 2014-02-12 ENCOUNTER — Other Ambulatory Visit: Payer: Self-pay | Admitting: Cardiology

## 2014-02-17 ENCOUNTER — Ambulatory Visit: Payer: Medicare Other | Admitting: Cardiology

## 2014-03-15 ENCOUNTER — Other Ambulatory Visit: Payer: Self-pay | Admitting: Cardiology

## 2014-03-17 ENCOUNTER — Ambulatory Visit (AMBULATORY_SURGERY_CENTER): Payer: Self-pay | Admitting: *Deleted

## 2014-03-17 VITALS — Ht 61.5 in | Wt 215.2 lb

## 2014-03-17 DIAGNOSIS — Z86010 Personal history of colonic polyps: Secondary | ICD-10-CM

## 2014-03-17 MED ORDER — MOVIPREP 100 G PO SOLR: 1.0000 | Freq: Once | ORAL | Status: DC

## 2014-03-17 NOTE — Telephone Encounter (Signed)
Rx(s) sent to pharmacy electronically.  

## 2014-03-17 NOTE — Progress Notes (Signed)
No egg or soy allergy. ewm No diet pills, no blood thinners, no home 02 use. ewm No issues with past sedation. ewm Pt doesn't have e mail. ewm

## 2014-03-31 ENCOUNTER — Other Ambulatory Visit: Payer: Self-pay | Admitting: Cardiology

## 2014-04-02 ENCOUNTER — Encounter: Payer: Self-pay | Admitting: Internal Medicine

## 2014-04-02 ENCOUNTER — Ambulatory Visit (AMBULATORY_SURGERY_CENTER): Payer: Medicare Other | Admitting: Internal Medicine

## 2014-04-02 VITALS — BP 124/59 | HR 66 | Temp 98.5°F | Resp 15 | Ht 61.0 in | Wt 215.0 lb

## 2014-04-02 DIAGNOSIS — D124 Benign neoplasm of descending colon: Secondary | ICD-10-CM

## 2014-04-02 DIAGNOSIS — D123 Benign neoplasm of transverse colon: Secondary | ICD-10-CM

## 2014-04-02 DIAGNOSIS — D120 Benign neoplasm of cecum: Secondary | ICD-10-CM

## 2014-04-02 DIAGNOSIS — Z86010 Personal history of colonic polyps: Secondary | ICD-10-CM

## 2014-04-02 MED ORDER — SODIUM CHLORIDE 0.9 % IV SOLN: 500.0000 mL | INTRAVENOUS | Status: DC

## 2014-04-02 NOTE — Progress Notes (Signed)
Called to room to assist during endoscopic procedure.  Patient ID and intended procedure confirmed with present staff. Received instructions for my participation in the procedure from the performing physician.  

## 2014-04-02 NOTE — Patient Instructions (Signed)
YOU HAD AN ENDOSCOPIC PROCEDURE TODAY AT THE [facility]: Refer to the procedure report that was given to you for any specific questions about what was found during the examination.  If the procedure report does not answer your questions, please call your gastroenterologist to clarify.  If you requested that your care partner not be given the details of your procedure findings, then the procedure report has been included in a sealed envelope for you to review at your convenience later.  YOU SHOULD EXPECT: Some feelings of bloating in the abdomen. Passage of more gas than usual.  Walking can help get rid of the air that was put into your GI tract during the procedure and reduce the bloating. If you had a lower endoscopy (such as a colonoscopy or flexible sigmoidoscopy) you may notice spotting of blood in your stool or on the toilet paper. If you underwent a bowel prep for your procedure, then you may not have a normal bowel movement for a few days.  DIET: Your first meal following the procedure should be a light meal and then it is ok to progress to your normal diet.  A half-sandwich or bowl of soup is an example of a good first meal.  Heavy or fried foods are harder to digest and may make you feel nauseous or bloated.  Likewise meals heavy in dairy and vegetables can cause extra gas to form and this can also increase the bloating.  Drink plenty of fluids but you should avoid alcoholic beverages for 24 hours.  ACTIVITY: Your care partner should take you home directly after the procedure.  You should plan to take it easy, moving slowly for the rest of the day.  You can resume normal activity the day after the procedure however you should NOT DRIVE or use heavy machinery for 24 hours (because of the sedation medicines used during the test).    SYMPTOMS TO REPORT IMMEDIATELY: A gastroenterologist can be reached at any hour.  During normal business hours, 8:30 AM to 5:00 PM Monday through Friday,  call [phone].  After hours and on weekends, please call the GI answering service at [phone] who will take a message and have the physician on call contact you.   Following lower endoscopy (colonoscopy or flexible sigmoidoscopy):  Excessive amounts of blood in the stool  Significant tenderness or worsening of abdominal pains  Swelling of the abdomen that is new, acute  Fever of 100F or higher   FOLLOW UP: If any biopsies were taken you will be contacted by phone or by letter within the next 1-3 weeks.  Call your gastroenterologist if you have not heard about the biopsies in 3 weeks.  Our staff will call the home number listed on your records the next business day following your procedure to check on you and address any questions or concerns that you may have at that time regarding the information given to you following your procedure. This is a courtesy call and so if there is no answer at the home number and we have not heard from you through the emergency physician on call, we will assume that you have returned to your regular daily activities without incident.  SIGNATURES/CONFIDENTIALITY: You and/or your care partner have signed paperwork which will be entered into your electronic medical record.  These signatures attest to the fact that that the information above on your After Visit Summary has been reviewed and is understood.  Full responsibility of the confidentiality of   this discharge information lies with you and/or your care-partner.   Resume medications. Information given on polyps,diverticulosis and high fiber diet with discharge instructions. 

## 2014-04-02 NOTE — Op Note (Signed)
[facility]  [address] [place], [id number]   COLONOSCOPY PROCEDURE REPORT  PATIENT: Brittany, Mcclure  MR#: [id number] BIRTHDATE: 1941/03/12 , 47  yrs. old GENDER: female ENDOSCOPIST: Jerene Bears, MD PROCEDURE DATE:  04/02/2014 PROCEDURE:   Colonoscopy with snare polypectomy and Colonoscopy with cold biopsy polypectomy First Screening Colonoscopy - Avg.  risk and is 50 yrs.  old or older - No.  Prior Negative Screening - Now for repeat screening. N/A  History of Adenoma - Now for follow-up colonoscopy & has been > or = to 3 yrs.  Yes hx of adenoma.  Has been 3 or more years since last colonoscopy.  Polyps Removed Today? Yes. ASA CLASS:   Class III INDICATIONS:surveillance colonoscopy based on a history of adenomatous colonic polyp(s), patient's immediate family history of colon cancer, and last colonoscopy completed 2010 (Dr. Sharlett Iles)  MEDICATIONS: Propofol 400 mg IV and Monitored anesthesia care  DESCRIPTION OF PROCEDURE:   After the risks benefits and alternatives of the procedure were thoroughly explained, informed consent was obtained.  The digital rectal exam revealed no rectal mass.   The LB [id number]  endoscope was introduced through the anus and advanced to the cecum, which was identified by both the appendix and ileocecal valve. No adverse events experienced. The quality of the prep was good, using MoviPrep  The instrument was then slowly withdrawn as the colon was fully examined.   COLON FINDINGS: Seven sessile polyps ranging between 3-52mm in size were found in the transverse colon (4), descending colon (1), and at the cecum (2).  Polypectomies were performed with a cold snare (5) and with cold forceps (2).  The resection was complete, the polyp tissue was completely retrieved and sent to histology. There was severe diverticulosis noted in the descending colon and sigmoid colon.  Retroflexed views revealed internal hemorrhoids. The time  to cecum=5 minutes 26 seconds.  Withdrawal time=20 minutes 14 seconds.  The scope was withdrawn and the procedure completed. COMPLICATIONS: There were no immediate complications.  ENDOSCOPIC IMPRESSION: 1.   Seven sessile polyps ranging between 3-57mm in size were found in the transverse colon, descending colon, and at the cecum; polypectomies were performed with a cold snare and with cold forceps 2.   Severe diverticulosis was noted in the descending colon and sigmoid colon  RECOMMENDATIONS: 1.  Await pathology results 2.  High fiber diet 3.  Timing of repeat colonoscopy will be determined by pathology findings. 4.  You will receive a letter within 1-2 weeks with the results of your biopsy as well as final recommendations.  Please call my office if you have not received a letter after 3 weeks.  eSigned:  Jerene Bears, MD 04/02/2014 11:11 AM cc: The Patient and Darlin Coco, MD

## 2014-04-02 NOTE — Progress Notes (Signed)
  [facility] Post-op Note  Patient: Brittany Mcclure  Procedure(s) Performed: colonoscopy  Patient Location: [facility] - Recovery Area  Anesthesia Type: Deep Sedation/Propofol  Level of Consciousness: awake, oriented and patient cooperative  Airway and Oxygen Therapy: Patient Spontanous Breathing  Post-op Pain: none  Post-op Assessment:  Post-op Vital signs reviewed, Patient's Cardiovascular Status Stable, Respiratory Function Stable, Patent Airway, No signs of Nausea or vomiting and Pain level controlled  Post-op Vital Signs: Reviewed and stable  Complications: No apparent anesthesia complications  Payslee Bateson E 11:11 AM

## 2014-04-03 ENCOUNTER — Telehealth: Payer: Self-pay | Admitting: *Deleted

## 2014-04-03 NOTE — Telephone Encounter (Signed)
  Follow up Call-  Call back number 04/02/2014  Post procedure Call Back phone  # [phone]  Permission to leave phone message Yes     Patient questions:  Do you have a fever, pain , or abdominal swelling? No. Pain Score  0 *  Have you tolerated food without any problems? Yes.    Have you been able to return to your normal activities? Yes.    Do you have any questions about your discharge instructions: Diet   No. Medications  No. Follow up visit  No.  Do you have questions or concerns about your Care? No.  Actions: * If pain score is 4 or above: No action needed, pain <4.

## 2014-04-09 ENCOUNTER — Encounter: Payer: Self-pay | Admitting: Internal Medicine

## 2014-05-23 ENCOUNTER — Other Ambulatory Visit: Payer: Self-pay | Admitting: Cardiology

## 2014-05-23 DIAGNOSIS — F419 Anxiety disorder, unspecified: Secondary | ICD-10-CM

## 2014-06-27 ENCOUNTER — Other Ambulatory Visit: Payer: Self-pay | Admitting: Cardiology

## 2014-06-27 NOTE — Telephone Encounter (Signed)
Please advise on refills. Patient is overdue for an appointment and she cancelled her last one, but never rescheduled. Thanks, MI

## 2014-09-24 ENCOUNTER — Other Ambulatory Visit: Payer: Self-pay | Admitting: Cardiology

## 2014-10-14 ENCOUNTER — Other Ambulatory Visit: Payer: Self-pay | Admitting: Cardiology

## 2014-10-22 ENCOUNTER — Other Ambulatory Visit: Payer: Self-pay | Admitting: Cardiology

## 2014-10-25 ENCOUNTER — Other Ambulatory Visit: Payer: Self-pay | Admitting: Cardiology

## 2014-10-25 DIAGNOSIS — F329 Major depressive disorder, single episode, unspecified: Secondary | ICD-10-CM

## 2014-10-25 DIAGNOSIS — F32A Depression, unspecified: Secondary | ICD-10-CM

## 2014-11-21 ENCOUNTER — Other Ambulatory Visit: Payer: Self-pay | Admitting: Cardiology

## 2014-11-26 ENCOUNTER — Encounter: Payer: Self-pay | Admitting: *Deleted

## 2014-12-01 ENCOUNTER — Ambulatory Visit (INDEPENDENT_AMBULATORY_CARE_PROVIDER_SITE_OTHER): Payer: Medicare Other | Admitting: Cardiology

## 2014-12-01 ENCOUNTER — Encounter: Payer: Self-pay | Admitting: Cardiology

## 2014-12-01 VITALS — BP 138/84 | HR 59 | Ht 61.5 in | Wt 207.8 lb

## 2014-12-01 DIAGNOSIS — R5381 Other malaise: Secondary | ICD-10-CM

## 2014-12-01 DIAGNOSIS — E7800 Pure hypercholesterolemia, unspecified: Secondary | ICD-10-CM

## 2014-12-01 DIAGNOSIS — R7301 Impaired fasting glucose: Secondary | ICD-10-CM

## 2014-12-01 DIAGNOSIS — R5383 Other fatigue: Secondary | ICD-10-CM | POA: Diagnosis not present

## 2014-12-01 DIAGNOSIS — I119 Hypertensive heart disease without heart failure: Secondary | ICD-10-CM | POA: Diagnosis not present

## 2014-12-01 LAB — CBC WITH DIFFERENTIAL/PLATELET
BASOS ABS: 0.1 10*3/uL (ref 0.0–0.1)
Basophils Relative: 0.5 % (ref 0.0–3.0)
Eosinophils Absolute: 0.2 10*3/uL (ref 0.0–0.7)
Eosinophils Relative: 2 % (ref 0.0–5.0)
HCT: 40.8 % (ref 36.0–46.0)
Hemoglobin: 13.2 g/dL (ref 12.0–15.0)
LYMPHS ABS: 2.3 10*3/uL (ref 0.7–4.0)
Lymphocytes Relative: 21 % (ref 12.0–46.0)
MCHC: 32.4 g/dL (ref 30.0–36.0)
MCV: 94 fl (ref 78.0–100.0)
MONOS PCT: 5.3 % (ref 3.0–12.0)
Monocytes Absolute: 0.6 10*3/uL (ref 0.1–1.0)
NEUTROS ABS: 7.7 10*3/uL (ref 1.4–7.7)
NEUTROS PCT: 71.2 % (ref 43.0–77.0)
PLATELETS: 295 10*3/uL (ref 150.0–400.0)
RBC: 4.35 Mil/uL (ref 3.87–5.11)
RDW: 14.3 % (ref 11.5–15.5)
WBC: 10.8 10*3/uL — ABNORMAL HIGH (ref 4.0–10.5)

## 2014-12-01 LAB — HEMOGLOBIN A1C: Hgb A1c MFr Bld: 11.3 % — ABNORMAL HIGH (ref 4.6–6.5)

## 2014-12-01 LAB — BASIC METABOLIC PANEL
BUN: 19 mg/dL (ref 6–23)
CALCIUM: 9.5 mg/dL (ref 8.4–10.5)
CO2: 29 meq/L (ref 19–32)
Chloride: 95 mEq/L — ABNORMAL LOW (ref 96–112)
Creatinine, Ser: 1.02 mg/dL (ref 0.40–1.20)
GFR: 55.95 mL/min — ABNORMAL LOW (ref >60.00)
GLUCOSE: 266 mg/dL — AB (ref 70–99)
Potassium: 4.5 mEq/L (ref 3.5–5.1)
SODIUM: 132 meq/L — AB (ref 135–145)

## 2014-12-01 LAB — HEPATIC FUNCTION PANEL
ALK PHOS: 109 U/L (ref 39–117)
ALT: 55 U/L — ABNORMAL HIGH (ref 0–35)
AST: 53 U/L — AB (ref 0–37)
Albumin: 4 g/dL (ref 3.5–5.2)
BILIRUBIN DIRECT: 0.2 mg/dL (ref 0.0–0.3)
BILIRUBIN TOTAL: 0.9 mg/dL (ref 0.2–1.2)
Total Protein: 7.1 g/dL (ref 6.0–8.3)

## 2014-12-01 LAB — LIPID PANEL
CHOL/HDL RATIO: 4
CHOLESTEROL: 178 mg/dL (ref 0–200)
HDL: 48.6 mg/dL (ref >39.00)
LDL Cholesterol: 91 mg/dL (ref 0–99)
NonHDL: 129.06
Triglycerides: 191 mg/dL — ABNORMAL HIGH (ref 0.0–149.0)
VLDL: 38.2 mg/dL (ref 0.0–40.0)

## 2014-12-01 LAB — TSH: TSH: 1.6 u[IU]/mL (ref 0.35–4.50)

## 2014-12-01 MED ORDER — LOSARTAN POTASSIUM 25 MG PO TABS: 25.0000 mg | ORAL_TABLET | Freq: Every day | ORAL | Status: DC

## 2014-12-01 NOTE — Patient Instructions (Addendum)
Medication Instructions:  START LOSARTAN 25 MG ONE DAILY  Labwork: LP/BMET/HFP/A1C/CBC  BMET IN 2 WEEKS   Testing/Procedures: NONE  Follow-Up: AS NEEDED

## 2014-12-01 NOTE — Progress Notes (Signed)
Cardiology Office Note   Date:  12/01/2014   ID:  Brittany Mcclure, DOB 01-May-1940, MRN [id number]  PCP:  No PCP Per Patient  Cardiologist: Darlin Coco MD  No chief complaint on file.     History of Present Illness: Brittany Mcclure is a 74 y.o. female who presents for a one-year follow-up visit.  This pleasant 74 year old woman is seen for a scheduled followup office visit. . She has a past history of essential hypertension and history of dyslipidemia and metabolic syndrome. She has a difficult time with weight loss. She's had a known previous right carotid bruit with normal Dopplers in October 2010. The patient does not have any history of ischemic heart disease and she did have a nuclear stress test on 10/07/09 which showed no ischemia and her ejection fraction was 81%. Since last visit she's had no new cardiac symptoms.  She has a history of hypertension.  She brought in a list of her home blood pressures which are high in the 892 systolic range.  She has a new blood pressure cuff.  She has a history of metabolic syndrome.  Since last visit she has lost 8 pounds.  She is not getting any regular exercise.  She dropped out of the Silver sneakers program.  She has occasional feeling of tightness in the chest usually related to stress and usually at rest.  She wonders if it could also be reflux or a hiatal hernia..   Past Medical History  Diagnosis Date  . Exogenous obesity   . Metabolic syndrome   . Hypertension   . Carotid bruit     right  . Chest pain   . Dyslipidemia   . Hyperlipidemia   . Seasonal allergies     seasonal  . Anxiety   . Arthritis     with gout  . Cataract   . GERD (gastroesophageal reflux disease)     Past Surgical History  Procedure Laterality Date  . Foot surgery      x 2  . Tubal ligation    . Dilation and curettage of uterus    . Colonoscopy      11/90,11/91,1994,2000,2010  . Polypectomy    . Hand surgery    . Uterine polyps removed      x2     Current Outpatient Prescriptions  Medication Sig Dispense Refill  . allopurinol (ZYLOPRIM) 100 MG tablet TAKE 2 TABLETS BY MOUTH EVERY DAY. 30 tablet 0  . ALPRAZolam (XANAX) 0.25 MG tablet TAKE 1 TABLET BY MOUTH TWICE DAILY. 60 tablet 3  . bisoprolol-hydrochlorothiazide (ZIAC) 10-6.25 MG per tablet TAKE 1 TABLET BY MOUTH DAILY 15 tablet 0  . Cholecalciferol (D3-1000) 1000 UNITS capsule Take 1,000 Units by mouth daily.    . clobetasol (TEMOVATE) 0.05 % external solution Apply 1 application topically as needed.    . Cyanocobalamin (VITAMIN B 12 PO) Take 1 tablet by mouth daily.     . fluocinonide (LIDEX) 0.05 % cream Apply 1 application topically daily as needed (skin irritation).     Marland Kitchen FLUoxetine (PROZAC) 40 MG capsule TAKE ONE CAPSULE BY MOUTH EVERY DAY 90 capsule 0  . ketoconazole (NIZORAL) 2 % cream Apply 1 application topically daily as needed (skin irritation).     . lansoprazole (PREVACID) 15 MG capsule Take 15 mg by mouth as needed (heartburn or indigestion).     . Omega-3 Fatty Acids (FISH OIL) 1000 MG CAPS Take 2 capsules by mouth daily.     Marland Kitchen  ZETIA 10 MG tablet TAKE 1 TABLET BY MOUTH EVERY DAY. 30 tablet 3  . losartan (COZAAR) 25 MG tablet Take 1 tablet (25 mg total) by mouth daily. 30 tablet 5   No current facility-administered medications for this visit.    Allergies:   Crestor; Lipitor; Lovastatin; Sulfonamide derivatives; and Sulfur    Social History:  The patient  reports that she has quit smoking. She has never used smokeless tobacco. She reports that she drinks alcohol. She reports that she does not use illicit drugs.   Family History:  The patient's family history includes Colon cancer in her mother; Hypertension in her father; Stroke in her father. There is no history of Rectal cancer or Stomach cancer.    ROS:  Please see the history of present illness.   Otherwise, review of systems are positive for none.   All other systems are reviewed and negative.     PHYSICAL EXAM: VS:  BP 138/84 mmHg  Pulse 59  Ht 5' 1.5" (1.562 m)  Wt 207 lb 12.8 oz (94.257 kg)  BMI 38.63 kg/m2 , BMI Body mass index is 38.63 kg/(m^2). GEN: Well nourished, well developed, in no acute distress HEENT: normal Neck: no JVD, carotid bruits, or masses.  I cannot hear any right carotid bruit today Cardiac: RRR; no murmurs, rubs, or gallops,no edema  Respiratory:  clear to auscultation bilaterally, normal work of breathing GI: soft, nontender, nondistended, + BS MS: no deformity or atrophy Skin: warm and dry, no rash Neuro:  Strength and sensation are intact Psych: euthymic mood, full affect   EKG:  EKG is ordered today. The ekg ordered today demonstrates sinus bradycardia at 59 bpm.  Otherwise normal EKG.  Since 11/23/12, no significant change.   Recent Labs: No results found for requested labs within last 365 days.    Lipid Panel    Component Value Date/Time   CHOL 220* 10/14/2013 1112   TRIG 177.0* 10/14/2013 1112   HDL 49.20 10/14/2013 1112   CHOLHDL 4 10/14/2013 1112   VLDL 35.4 10/14/2013 1112   LDLCALC 135* 10/14/2013 1112   LDLDIRECT 136.7 02/15/2013 1046      Wt Readings from Last 3 Encounters:  12/01/14 207 lb 12.8 oz (94.257 kg)  04/02/14 215 lb (97.523 kg)  03/17/14 215 lb 3.2 oz (97.614 kg)        ASSESSMENT AND PLAN:  1. essential hypertension without heart failure, not adequately controlled 2. metabolic syndrome 3. Dyslipidemia 4.  Previous right carotid bruit with normal Dopplers 2010 5. hyperuricemia with history of gout right hand 6.  Hyperglycemia 7.  Malaise and fatigue Current medicines are reviewed at length with the patient today.  The patient does not have concerns regarding medicines.  The following changes have been made:  We will start her on losartan 25 mg one daily to help with blood pressure control.  Labs/ tests ordered today include:   Orders Placed This Encounter  Procedures  . Lipid panel  . Hepatic  function panel  . Basic metabolic panel  . CBC with Differential/Platelet  . Hemoglobin A1c  . Basic metabolic panel  . TSH  . EKG 12-Lead     Disposition: Encouraged her to get back into the Silver sneakers program.  She needs to lose weight.  Needs more regular exercise.  We are checking full labs today including A1c to check on prediabetic condition.  She also complains of lack of energy and we will get a CBC.  And a TSH  She will return in 2 weeks for follow-up basal metabolic panel to check on the losartan response.  Return here when necessary. She will establish care with a PCP at [facility], Darlin Coco MD 12/01/2014 10:32 [address], [place], [place]  [address] Phone: [phone]; Fax: [phone]

## 2014-12-03 ENCOUNTER — Telehealth: Payer: Self-pay | Admitting: *Deleted

## 2014-12-03 DIAGNOSIS — E119 Type 2 diabetes mellitus without complications: Secondary | ICD-10-CM

## 2014-12-03 MED ORDER — METFORMIN HCL 500 MG PO TABS: 500.0000 mg | ORAL_TABLET | Freq: Every day | ORAL | Status: DC

## 2014-12-03 NOTE — Telephone Encounter (Signed)
-----   Message from Darlin Coco, MD sent at 12/01/2014 10:06 PM EDT ----- Please report.  The tests show unfortunately that she is a definite diabetic.  BS 266 very high and A1C 11.3 very high.  The LFTs are consistent with fatty liver. Thyroid is okay.  Cholesterol better but TGs are high c/w high BS.  I would like her to get established with PCP at [facility] as soon as possible.  In the meantime needs to be on a diabetic diet.  Also start metformin 500 mg daily with breakfast. Start exercise program and losing weight.

## 2014-12-03 NOTE — Telephone Encounter (Signed)
Advised patient and will make referral

## 2014-12-05 NOTE — Telephone Encounter (Signed)
Follow up    Pt is checking to see if referral has been done Please call to discuss

## 2014-12-05 NOTE — Telephone Encounter (Signed)
Spoke with patient and advised patient referral in [place] PCP office should call with appointment Stated she did have loose bowels today and not sure if related to Metformin or if secondary to nerves Advised patient to continue medication and call back Monday if continues, verbalized understanding

## 2014-12-11 ENCOUNTER — Telehealth: Payer: Self-pay | Admitting: General Practice

## 2014-12-11 NOTE — Telephone Encounter (Signed)
Pt states dr Mare Ferrari referred her to you to est as a new pt.  Advised pt you were not accepting Pt states she has new DM dx. Dr Mare Ferrari put her on metformin and advised pt to be seen asap.  Will you accept, or try to go w/ Brittany Mcclure?

## 2014-12-11 NOTE — Telephone Encounter (Signed)
Please let her know I am full/no longer taking new patients and advise her of other options [facility] and Dr. Yong Channel. Please address any question regarding timing of NPV with one of them thanks.

## 2014-12-16 ENCOUNTER — Other Ambulatory Visit (INDEPENDENT_AMBULATORY_CARE_PROVIDER_SITE_OTHER): Payer: Medicare Other | Admitting: *Deleted

## 2014-12-16 DIAGNOSIS — I119 Hypertensive heart disease without heart failure: Secondary | ICD-10-CM

## 2014-12-16 LAB — BASIC METABOLIC PANEL
BUN: 22 mg/dL (ref 6–23)
CHLORIDE: 101 meq/L (ref 96–112)
CO2: 26 mEq/L (ref 19–32)
CREATININE: 1.1 mg/dL (ref 0.40–1.20)
Calcium: 9.7 mg/dL (ref 8.4–10.5)
GFR: 51.27 mL/min — AB (ref >60.00)
Glucose, Bld: 163 mg/dL — ABNORMAL HIGH (ref 70–99)
POTASSIUM: 4.9 meq/L (ref 3.5–5.1)
Sodium: 136 mEq/L (ref 135–145)

## 2014-12-17 ENCOUNTER — Other Ambulatory Visit: Payer: Self-pay | Admitting: Cardiology

## 2014-12-17 ENCOUNTER — Other Ambulatory Visit: Payer: Self-pay | Admitting: *Deleted

## 2014-12-17 ENCOUNTER — Ambulatory Visit (INDEPENDENT_AMBULATORY_CARE_PROVIDER_SITE_OTHER): Payer: Medicare Other | Admitting: Adult Health

## 2014-12-17 ENCOUNTER — Encounter: Payer: Self-pay | Admitting: Adult Health

## 2014-12-17 VITALS — BP 130/72 | HR 56 | Temp 98.6°F | Ht 61.5 in | Wt 204.8 lb

## 2014-12-17 DIAGNOSIS — I10 Essential (primary) hypertension: Secondary | ICD-10-CM

## 2014-12-17 DIAGNOSIS — E669 Obesity, unspecified: Secondary | ICD-10-CM | POA: Insufficient documentation

## 2014-12-17 DIAGNOSIS — Z7689 Persons encountering health services in other specified circumstances: Secondary | ICD-10-CM

## 2014-12-17 DIAGNOSIS — E1169 Type 2 diabetes mellitus with other specified complication: Secondary | ICD-10-CM | POA: Insufficient documentation

## 2014-12-17 DIAGNOSIS — E1165 Type 2 diabetes mellitus with hyperglycemia: Secondary | ICD-10-CM | POA: Diagnosis not present

## 2014-12-17 DIAGNOSIS — Z7189 Other specified counseling: Secondary | ICD-10-CM | POA: Diagnosis not present

## 2014-12-17 DIAGNOSIS — IMO0002 Reserved for concepts with insufficient information to code with codable children: Secondary | ICD-10-CM

## 2014-12-17 MED ORDER — GLUCOSE BLOOD VI STRP: ORAL_STRIP | Status: DC

## 2014-12-17 MED ORDER — ACCU-CHEK SOFT TOUCH LANCETS MISC: Status: DC

## 2014-12-17 NOTE — Progress Notes (Signed)
Pre visit review using our clinic review tool, if applicable. No additional management support is needed unless otherwise documented below in the visit note. 

## 2014-12-17 NOTE — Progress Notes (Signed)
HPI:  Brittany Mcclure is here to establish care. She is a very pleasant 74 year old Caucasian female who  has a past medical history of Exogenous obesity; Metabolic syndrome; Hypertension; Carotid bruit; Chest pain; Dyslipidemia; Hyperlipidemia; Seasonal allergies; Anxiety; Arthritis; Cataract; and GERD (gastroesophageal reflux disease).  Last PCP and physical: She has been seeing her Cardiologist (Dr. Mare Ferrari) and using him as a PCP.  Immunizations:UTD Diet:Since finding out she had diabetes, she is counting carbs, reading labels, and eating healthy.  Exercise: She has been walking every day since finding out she had diabetes. Has joined silver sneakers.  Colonoscopy:04/02/2014 - Showed seven polyps and severe diverticulosis. She is to go every 3 years.  Dexa:2001 Pap Smear: No abnormal in the past Mammogram:2016  Followed by  Cardiology - Dr. Mare Ferrari.  Has the following chronic problems that require follow up and concerns today:  Diabetes Was recently diagnosed with DM II and started on Metformin 500mg  daily. Her last A1c was  Lab Results  Component Value Date   HGBA1C 11.3* 12/01/2014   She endorses that she's been working on lifestyle modifications as being diagnosed with diabetes. Currently she is walking every day with her dog, has been trying to increase the amount of time she walks. She is also planning on joining silver sneakers. I wise, since finding out she had diabetes she's been counting carbs, reading labels at the grocery store, and trying to eat healthy.  Hypertension  She is currently taking Ziac and Cozaar.She feels as though her blood pressure is well controlled at home.denies any issues with blurred vision, lightheadedness, or dizziness.Today in the office her blood pressure is 130/72.   ROS negative for unless reported above: fevers, chills,feeling poorly, unintentional weight loss, hearing or vision loss, chest pain, palpitations, leg claudication,  struggling to breath,Not feeling congested in the chest, no orthopenia, no cough,no wheezing, normal appetite, no soft tissue swelling, no hemoptysis, melena, hematochezia, hematuria, falls, loc, si, or thoughts of self harm.    Past Medical History  Diagnosis Date  . Exogenous obesity   . Metabolic syndrome   . Hypertension   . Carotid bruit     right  . Chest pain   . Dyslipidemia   . Hyperlipidemia   . Seasonal allergies     seasonal  . Anxiety   . Arthritis     with gout  . Cataract   . GERD (gastroesophageal reflux disease)     Past Surgical History  Procedure Laterality Date  . Foot surgery      x 2  . Tubal ligation    . Dilation and curettage of uterus    . Colonoscopy      11/90,11/91,1994,2000,2010  . Polypectomy    . Hand surgery    . Uterine polyps removed      x2    Family History  Problem Relation Age of Onset  . Colon cancer Mother   . Stroke Father   . Hypertension Father   . Rectal cancer Neg Hx   . Stomach cancer Neg Hx     Social History   Social History  . Marital Status: Married    Spouse Name: N/A  . Number of Children: 2  . Years of Education: N/A   Occupational History  . retired    Social History Main Topics  . Smoking status: Former Research scientist (life sciences)  . Smokeless tobacco: Never Used  . Alcohol Use: 0.0 oz/week    0 Standard drinks or equivalent per  week     Comment: socially  . Drug Use: No  . Sexual Activity: Not Asked   Other Topics Concern  . None   Social History Narrative     Current outpatient prescriptions:  .  allopurinol (ZYLOPRIM) 100 MG tablet, TAKE 2 TABLETS BY MOUTH EVERY DAY., Disp: 30 tablet, Rfl: 0 .  ALPRAZolam (XANAX) 0.25 MG tablet, TAKE 1 TABLET BY MOUTH TWICE DAILY., Disp: 60 tablet, Rfl: 3 .  bisoprolol-hydrochlorothiazide (ZIAC) 10-6.25 MG tablet, TAKE 1 TABLET BY MOUTH DAILY, Disp: 30 tablet, Rfl: 3 .  Cholecalciferol (D3-1000) 1000 UNITS capsule, Take 1,000 Units by mouth daily., Disp: , Rfl:  .   clobetasol (TEMOVATE) 0.05 % external solution, Apply 1 application topically as needed., Disp: , Rfl:  .  Cyanocobalamin (VITAMIN B 12 PO), Take 1 tablet by mouth daily. , Disp: , Rfl:  .  fluocinonide (LIDEX) 0.05 % cream, Apply 1 application topically daily as needed (skin irritation). , Disp: , Rfl:  .  FLUoxetine (PROZAC) 40 MG capsule, TAKE ONE CAPSULE BY MOUTH EVERY DAY, Disp: 90 capsule, Rfl: 0 .  ketoconazole (NIZORAL) 2 % cream, Apply 1 application topically daily as needed (skin irritation). , Disp: , Rfl:  .  lansoprazole (PREVACID) 15 MG capsule, Take 15 mg by mouth as needed (heartburn or indigestion). , Disp: , Rfl:  .  losartan (COZAAR) 25 MG tablet, Take 1 tablet (25 mg total) by mouth daily., Disp: 30 tablet, Rfl: 5 .  metFORMIN (GLUCOPHAGE) 500 MG tablet, Take 1 tablet (500 mg total) by mouth daily with breakfast., Disp: 30 tablet, Rfl: 5 .  Omega-3 Fatty Acids (FISH OIL) 1000 MG CAPS, Take 2 capsules by mouth daily. , Disp: , Rfl:  .  ZETIA 10 MG tablet, TAKE 1 TABLET BY MOUTH EVERY DAY., Disp: 30 tablet, Rfl: 3  EXAM:  Filed Vitals:   12/17/14 1301  BP: 130/72  Pulse: 56  Temp: 98.6 F (37 C)    Body mass index is 38.07 kg/(m^2).  GENERAL: vitals reviewed and listed above, alert, oriented, appears well hydrated and in no acute distress. Obese  HEENT: atraumatic, conjunttiva clear, no obvious abnormalities on inspection of external nose and ears  NECK: Neck is soft and supple without masses, no adenopathy or thyromegaly, trachea midline, no JVD. Normal range of motion.   LUNGS: clear to auscultation bilaterally, no wheezes, rales or rhonchi, good air movement  CV: Regular rate and rhythm, normal S1/S2, no audible murmurs, gallops, or rubs. No carotid bruit and no peripheral edema.   MS: moves all extremities without noticeable abnormality. No edema noted  Abd: soft/nontender/nondistended/normal bowel sounds   Skin: warm and dry, no rash   Extremities: No  clubbing, cyanosis, or edema. Capillary refill is WNL. Pulses intact bilaterally in upper and lower extremities.   Neuro: CN II-XII intact, sensation and reflexes normal throughout, 5/5 muscle strength in bilateral upper and lower extremities. Normal finger to nose. Normal rapid alternating movements. Normal romberg. No pronator drift.   PSYCH: pleasant and cooperative, no obvious depression or anxiety  ASSESSMENT AND PLAN:  1. Encounter to establish care - Follow up at next available appointment for physical  - Follow up sooner if needed.  - Continue to work on diet and exercise  2. Essential hypertension - fairly controlled on current medication regimen. -Sitter adding ACE inhibitor due to blood pressure not being at goal as well as new diagnosis of diabetes. -monitor blood pressure at home 3. Diabetes type 2, uncontrolled -  diabetic diet discussed with patient. She does not want to see a diabetic educator at this time. -Continued to work on diabetic diet and increase the amount of time she is walking daily. Also joined silver sneakers and start working out. -Continue with metformin 500 mg daily with breakfast. - Will check A1c at next visit - Glucometers with patient. She is to check her blood sugars 3 times a day and record in her log area and bring log to next appointment   -We reviewed the PMH, PSH, FH, SH, Meds and Allergies. -We provided refills for any medications we will prescribe as needed. -We addressed current concerns per orders and patient instructions. -We have asked for records for pertinent exams, studies, vaccines and notes from previous providers. -We have advised patient to follow up per instructions below.   -Patient advised to return or notify a provider immediately if symptoms worsen or persist or new concerns arise.  Dorothyann Peng, AGNP

## 2014-12-17 NOTE — Patient Instructions (Addendum)
It was a pleasure meeting you today!  Start using your glucometer three times a day. Ideally, I would like your sugar levels between 70 and 130 mg/dl before meals, and less than 180 two hours after starting a meal.   Follow up with me in three months for a physical. Continue to eat healthy and exercise.   Diabetes Mellitus and Food It is important for you to manage your blood sugar (glucose) level. Your blood glucose level can be greatly affected by what you eat. Eating healthier foods in the appropriate amounts throughout the day at about the same time each day will help you control your blood glucose level. It can also help slow or prevent worsening of your diabetes mellitus. Healthy eating may even help you improve the level of your blood pressure and reach or maintain a healthy weight.  HOW CAN FOOD AFFECT ME? Carbohydrates Carbohydrates affect your blood glucose level more than any other type of food. Your dietitian will help you determine how many carbohydrates to eat at each meal and teach you how to count carbohydrates. Counting carbohydrates is important to keep your blood glucose at a healthy level, especially if you are using insulin or taking certain medicines for diabetes mellitus. Alcohol Alcohol can cause sudden decreases in blood glucose (hypoglycemia), especially if you use insulin or take certain medicines for diabetes mellitus. Hypoglycemia can be a life-threatening condition. Symptoms of hypoglycemia (sleepiness, dizziness, and disorientation) are similar to symptoms of having too much alcohol.  If your health care provider has given you approval to drink alcohol, do so in moderation and use the following guidelines:  Women should not have more than one drink per day, and men should not have more than two drinks per day. One drink is equal to:  12 oz of beer.  5 oz of wine.  1 oz of hard liquor.  Do not drink on an empty stomach.  Keep yourself hydrated. Have water, diet  soda, or unsweetened iced tea.  Regular soda, juice, and other mixers might contain a lot of carbohydrates and should be counted. WHAT FOODS ARE NOT RECOMMENDED? As you make food choices, it is important to remember that all foods are not the same. Some foods have fewer nutrients per serving than other foods, even though they might have the same number of calories or carbohydrates. It is difficult to get your body what it needs when you eat foods with fewer nutrients. Examples of foods that you should avoid that are high in calories and carbohydrates but low in nutrients include:  Trans fats (most processed foods list trans fats on the Nutrition Facts label).  Regular soda.  Juice.  Candy.  Sweets, such as cake, pie, doughnuts, and cookies.  Fried foods. WHAT FOODS CAN I EAT? Have nutrient-rich foods, which will nourish your body and keep you healthy. The food you should eat also will depend on several factors, including:  The calories you need.  The medicines you take.  Your weight.  Your blood glucose level.  Your blood pressure level.  Your cholesterol level. You also should eat a variety of foods, including:  Protein, such as meat, poultry, fish, tofu, nuts, and seeds (lean animal proteins are best).  Fruits.  Vegetables.  Dairy products, such as milk, cheese, and yogurt (low fat is best).  Breads, grains, pasta, cereal, rice, and beans.  Fats such as olive oil, trans fat-free margarine, canola oil, avocado, and olives. DOES EVERYONE WITH DIABETES MELLITUS HAVE THE SAME  MEAL PLAN? Because every person with diabetes mellitus is different, there is not one meal plan that works for everyone. It is very important that you meet with a dietitian who will help you create a meal plan that is just right for you. Document Released: 12/02/2004 Document Revised: 03/12/2013 Document Reviewed: 02/01/2013 [facility] Patient Information 2015 [place], [place]. This information is not  intended to replace advice given to you by your health care provider. Make sure you discuss any questions you have with your health care provider.

## 2014-12-18 ENCOUNTER — Telehealth: Payer: Self-pay | Admitting: Cardiology

## 2014-12-18 NOTE — Telephone Encounter (Signed)
Advised patient of lab results  

## 2014-12-18 NOTE — Telephone Encounter (Signed)
pt rtn call to [facility]

## 2014-12-18 NOTE — Telephone Encounter (Signed)
-----   Message from Darlin Coco, MD sent at 12/16/2014  6:50 PM EDT ----- Renal function and potassium stable on losartan. BS is better.  CSD.

## 2014-12-19 ENCOUNTER — Other Ambulatory Visit: Payer: Self-pay

## 2014-12-19 DIAGNOSIS — Z1231 Encounter for screening mammogram for malignant neoplasm of breast: Secondary | ICD-10-CM

## 2014-12-22 NOTE — Telephone Encounter (Signed)
Pt has been scheduled w/ cory

## 2014-12-24 ENCOUNTER — Other Ambulatory Visit: Payer: Self-pay | Admitting: Cardiology

## 2015-01-15 ENCOUNTER — Other Ambulatory Visit: Payer: Self-pay | Admitting: Adult Health

## 2015-01-15 ENCOUNTER — Telehealth: Payer: Self-pay | Admitting: Adult Health

## 2015-01-15 DIAGNOSIS — IMO0001 Reserved for inherently not codable concepts without codable children: Secondary | ICD-10-CM

## 2015-01-15 DIAGNOSIS — E1165 Type 2 diabetes mellitus with hyperglycemia: Principal | ICD-10-CM

## 2015-01-15 NOTE — Telephone Encounter (Signed)
Pt states Brittany Mcclure had mentioned a nutritionalist /dietician for her DM Pt would like to proceed with this referral .

## 2015-01-20 ENCOUNTER — Encounter: Payer: Medicare Other | Attending: Adult Health

## 2015-01-20 VITALS — Ht 61.5 in | Wt 199.8 lb

## 2015-01-20 DIAGNOSIS — Z713 Dietary counseling and surveillance: Secondary | ICD-10-CM | POA: Diagnosis not present

## 2015-01-20 DIAGNOSIS — IMO0001 Reserved for inherently not codable concepts without codable children: Secondary | ICD-10-CM

## 2015-01-20 DIAGNOSIS — E1165 Type 2 diabetes mellitus with hyperglycemia: Secondary | ICD-10-CM | POA: Insufficient documentation

## 2015-01-21 ENCOUNTER — Ambulatory Visit
Admission: RE | Admit: 2015-01-21 | Discharge: 2015-01-21 | Disposition: A | Discharge status: Home / Self Care | Payer: Medicare Other | Source: Ambulatory Visit

## 2015-01-21 DIAGNOSIS — Z1231 Encounter for screening mammogram for malignant neoplasm of breast: Secondary | ICD-10-CM

## 2015-01-21 NOTE — Progress Notes (Signed)
Patient was seen on 01/20/15 for the first of a series of three diabetes self-management courses at [facility].  Patient Education Plan per assessed needs and concerns is to attend four course education program for Diabetes Self Management Education.  The following learning objectives were met by the patient during this class:  Describe diabetes  State some common risk factors for diabetes  Defines the role of glucose and insulin  Identifies type of diabetes and pathophysiology  Describe the relationship between diabetes and cardiovascular risk  State the members of the Healthcare Team  States the rationale for glucose monitoring  State when to test glucose  State their individual Target Range  State the importance of logging glucose readings  Describe how to interpret glucose readings  Identifies A1C target  Explain the correlation between A1c and eAG values  State symptoms and treatment of high blood glucose  State symptoms and treatment of low blood glucose  Explain proper technique for glucose testing  Identifies proper sharps disposal  Handouts given during class include:  Living Well with Diabetes book  Carb Counting and Meal Planning book  Meal Plan Card  Carbohydrate guide  Meal planning worksheet  Low Sodium Flavoring Tips  The diabetes portion plate  L9J to eAG Conversion Chart  Diabetes Medications  Diabetes Recommended Care Schedule  Support Group  Diabetes Success Plan  Core Class Satisfaction Survey  Follow-Up Plan:  Attend core 2

## 2015-01-22 ENCOUNTER — Ambulatory Visit: Payer: Medicare Other

## 2015-01-23 ENCOUNTER — Other Ambulatory Visit: Payer: Self-pay | Admitting: Cardiology

## 2015-01-23 DIAGNOSIS — R928 Other abnormal and inconclusive findings on diagnostic imaging of breast: Secondary | ICD-10-CM

## 2015-01-26 ENCOUNTER — Ambulatory Visit: Payer: Medicare Other | Admitting: Adult Health

## 2015-01-27 DIAGNOSIS — E1165 Type 2 diabetes mellitus with hyperglycemia: Secondary | ICD-10-CM | POA: Diagnosis not present

## 2015-01-27 DIAGNOSIS — E119 Type 2 diabetes mellitus without complications: Secondary | ICD-10-CM

## 2015-01-28 NOTE — Progress Notes (Signed)

## 2015-02-02 LAB — HM DIABETES EYE EXAM

## 2015-02-03 DIAGNOSIS — E1165 Type 2 diabetes mellitus with hyperglycemia: Secondary | ICD-10-CM | POA: Diagnosis not present

## 2015-02-03 DIAGNOSIS — E119 Type 2 diabetes mellitus without complications: Secondary | ICD-10-CM

## 2015-02-04 ENCOUNTER — Other Ambulatory Visit: Payer: Self-pay | Admitting: Cardiology

## 2015-02-04 ENCOUNTER — Ambulatory Visit
Admission: RE | Admit: 2015-02-04 | Discharge: 2015-02-04 | Disposition: A | Discharge status: Home / Self Care | Payer: Medicare Other | Source: Ambulatory Visit | Attending: Cardiology | Admitting: Cardiology

## 2015-02-04 DIAGNOSIS — R928 Other abnormal and inconclusive findings on diagnostic imaging of breast: Secondary | ICD-10-CM

## 2015-02-05 NOTE — Progress Notes (Signed)
Patient was seen on 02/03/15 for the third of a series of three diabetes self-management courses at the [facility].   Catalina Gravel the amount of activity recommended for healthy living . Describe activities suitable for individual needs . Identify ways to regularly incorporate activity into daily life . Identify barriers to activity and ways to over come these barriers  Identify diabetes medications being personally used and their primary action for lowering glucose and possible side effects . Describe role of stress on blood glucose and develop strategies to address psychosocial issues . Identify diabetes complications and ways to prevent them  Explain how to manage diabetes during illness . Evaluate success in meeting personal goal . Establish 2-3 goals that they will plan to diligently work on until they return for the  20-month follow-up visit  Goals:   I will be active 30 minutes or more 6 times a week  I will take my diabetes medications as scheduled  I will eat less unhealthy fats  I will test my glucose at least 1 times a day, 7 days a week  Your patient has identified these potential barriers to change:  Stress Lack of Family Support  Your patient has identified their diabetes self-care support plan as  Patient did not state Plan:  Attend Optional Core 4 in 4 months

## 2015-02-06 ENCOUNTER — Encounter: Payer: Self-pay | Admitting: Adult Health

## 2015-02-24 ENCOUNTER — Other Ambulatory Visit (INDEPENDENT_AMBULATORY_CARE_PROVIDER_SITE_OTHER): Payer: Medicare Other

## 2015-02-24 DIAGNOSIS — E119 Type 2 diabetes mellitus without complications: Secondary | ICD-10-CM | POA: Diagnosis not present

## 2015-02-24 LAB — HEMOGLOBIN A1C: Hgb A1c MFr Bld: 7.6 % — ABNORMAL HIGH (ref 4.6–6.5)

## 2015-03-03 ENCOUNTER — Encounter: Payer: Self-pay | Admitting: Adult Health

## 2015-03-03 ENCOUNTER — Ambulatory Visit (INDEPENDENT_AMBULATORY_CARE_PROVIDER_SITE_OTHER): Payer: Medicare Other | Admitting: Adult Health

## 2015-03-03 VITALS — BP 120/74 | Temp 98.2°F | Ht 61.5 in | Wt 192.8 lb

## 2015-03-03 DIAGNOSIS — Z780 Asymptomatic menopausal state: Secondary | ICD-10-CM

## 2015-03-03 DIAGNOSIS — Z0000 Encounter for general adult medical examination without abnormal findings: Secondary | ICD-10-CM

## 2015-03-03 DIAGNOSIS — Z23 Encounter for immunization: Secondary | ICD-10-CM

## 2015-03-03 DIAGNOSIS — R748 Abnormal levels of other serum enzymes: Secondary | ICD-10-CM | POA: Diagnosis not present

## 2015-03-03 DIAGNOSIS — I10 Essential (primary) hypertension: Secondary | ICD-10-CM

## 2015-03-03 DIAGNOSIS — E2839 Other primary ovarian failure: Secondary | ICD-10-CM

## 2015-03-03 LAB — HEPATIC FUNCTION PANEL
ALK PHOS: 89 U/L (ref 39–117)
ALT: 22 U/L (ref 0–35)
AST: 22 U/L (ref 0–37)
Albumin: 4.2 g/dL (ref 3.5–5.2)
BILIRUBIN DIRECT: 0.1 mg/dL (ref 0.0–0.3)
BILIRUBIN TOTAL: 0.6 mg/dL (ref 0.2–1.2)
Total Protein: 6.7 g/dL (ref 6.0–8.3)

## 2015-03-03 NOTE — Addendum Note (Signed)
Addended by: Colleen Can on: 03/03/2015 01:46 PM   Modules accepted: Orders

## 2015-03-03 NOTE — Progress Notes (Addendum)
Subjective:    Patient ID: Brittany Mcclure, female    DOB: 01/16/1941, 74 y.o.   MRN: [id number]  HPI  Patient presents for yearly preventative medicine examination. Medicare questionnaire was completed  All immunizations and health maintenance protocols were reviewed with the patient and needed orders were placed.  Appropriate screening laboratory values were ordered for the patient including screening of hyperlipidemia, renal function and hepatic function. If indicated by BPH, a PSA was ordered.  Medication reconciliation,  past medical history, social history, problem list and allergies were reviewed in detail with the patient  Goals were established with regard to weight loss, exercise, and  diet in compliance with medications  End of life planning was discussed.  She denies any interval history. Does her annual health maintenance items such as dental visits, eye exams, and monthly self breast exams.   She is followed by Cardiology and Dermatology.   She was diagnosed with DM II three months ago by her Cardiologist. At that time her A1c was 11.3. She has been exercising and is eating right. Has lost about 7 pounds in the last month. Her most current A1c is 7.6.  Wt Readings from Last 3 Encounters:  03/03/15 192 lb 12.8 oz (87.454 kg)  01/20/15 199 lb 12.8 oz (90.629 kg)  12/17/14 204 lb 12.8 oz (92.897 kg)      Review of Systems  Constitutional: Negative.   HENT: Negative.   Eyes: Negative.   Respiratory: Negative.   Cardiovascular: Negative.   Endocrine: Negative.   Genitourinary: Negative.   Musculoskeletal: Negative.   Allergic/Immunologic: Negative.   Neurological: Negative.   Hematological: Negative.   Psychiatric/Behavioral: Negative.   All other systems reviewed and are negative.  Past Medical History  Diagnosis Date  . Exogenous obesity   . Metabolic syndrome   . Hypertension   . Carotid bruit     right  . Chest pain   . Hyperlipidemia   .  Seasonal allergies     seasonal  . Anxiety   . Arthritis     with gout  . Cataract   . GERD (gastroesophageal reflux disease)   . Diabetes type 2, uncontrolled ([place])   . Diverticulosis   . Depression   . Colon polyps     Social History   Social History  . Marital Status: Widowed    Spouse Name: N/A  . Number of Children: 2  . Years of Education: N/A   Occupational History  . retired    Social History Main Topics  . Smoking status: Former Research scientist (life sciences)  . Smokeless tobacco: Never Used  . Alcohol Use: 0.0 oz/week    0 Standard drinks or equivalent per week     Comment: socially  . Drug Use: No  . Sexual Activity: Not on file   Other Topics Concern  . Not on file   Social History Narrative   Two years of college. She is retired from Scientist, physiological, worked as a Psychologist, occupational at a middle school.    Widowed for two years, married for 52 years   Has two children. Son lives in [place]. Daughter lives locally. 4 grandchildren.     Past Surgical History  Procedure Laterality Date  . Foot surgery      x 2  . Tubal ligation    . Dilation and curettage of uterus    . Colonoscopy      11/90,11/91,1994,2000,2010  . Polypectomy    . Hand surgery    .  Uterine polyps removed      x2    Family History  Problem Relation Age of Onset  . Colon cancer Mother   . Stroke Father   . Hypertension Father   . Rectal cancer Neg Hx   . Stomach cancer Neg Hx   . Heart failure Maternal Grandmother   . Diabetes Maternal Grandmother     ?    Allergies  Allergen Reactions  . Crestor [Rosuvastatin Calcium]   . Lipitor [Atorvastatin Calcium]   . Lovastatin   . Sulfonamide Derivatives     REACTION: n\T\v  . Sulfur     Current Outpatient Prescriptions on File Prior to Visit  Medication Sig Dispense Refill  . allopurinol (ZYLOPRIM) 100 MG tablet TAKE 2 TABLETS BY MOUTH EVERY DAY 60 tablet 5  . ALPRAZolam (XANAX) 0.25 MG tablet TAKE 1 TABLET BY MOUTH TWICE DAILY. 60 tablet 3  .  bisoprolol-hydrochlorothiazide (ZIAC) 10-6.25 MG tablet TAKE 1 TABLET BY MOUTH DAILY 30 tablet 3  . Cholecalciferol (D3-1000) 1000 UNITS capsule Take 1,000 Units by mouth daily.    . clobetasol (TEMOVATE) 0.05 % external solution Apply 1 application topically as needed.    . Cyanocobalamin (VITAMIN B 12 PO) Take 1 tablet by mouth daily.     . fluocinonide (LIDEX) 0.05 % cream Apply 1 application topically daily as needed (skin irritation).     Marland Kitchen FLUoxetine (PROZAC) 40 MG capsule TAKE ONE CAPSULE BY MOUTH EVERY DAY 90 capsule 0  . glucose blood (ACCU-CHEK SMARTVIEW) test strip Use to check blood sugar three times a day and PRN,  Dx code: E11.65 100 each 12  . ketoconazole (NIZORAL) 2 % cream Apply 1 application topically daily as needed (skin irritation).     . Lancets (ACCU-CHEK SOFT TOUCH) lancets Use as instructed 100 each 12  . lansoprazole (PREVACID) 15 MG capsule Take 15 mg by mouth as needed (heartburn or indigestion).     Marland Kitchen losartan (COZAAR) 25 MG tablet Take 1 tablet (25 mg total) by mouth daily. 30 tablet 5  . metFORMIN (GLUCOPHAGE) 500 MG tablet Take 1 tablet (500 mg total) by mouth daily with breakfast. 30 tablet 5  . Omega-3 Fatty Acids (FISH OIL) 1000 MG CAPS Take 2 capsules by mouth daily.     Marland Kitchen ZETIA 10 MG tablet TAKE 1 TABLET BY MOUTH EVERY DAY. 30 tablet 3   No current facility-administered medications on file prior to visit.    BP 120/74 mmHg  Temp(Src) 98.2 F (36.8 C) (Oral)  Ht 5' 1.5" (1.562 m)  Wt 192 lb 12.8 oz (87.454 kg)  BMI 35.84 kg/m2       Objective:   Physical Exam  Constitutional: She is oriented to person, place, and time. She appears well-developed and well-nourished. No distress.  HENT:  Head: Normocephalic and atraumatic.  Right Ear: External ear normal.  Left Ear: External ear normal.  Nose: Nose normal.  Mouth/Throat: Oropharynx is clear and moist. No oropharyngeal exudate.  Eyes: Conjunctivae are normal. Pupils are equal, round, and reactive  to light. Right eye exhibits no discharge. Left eye exhibits no discharge. No scleral icterus.  Neck: Normal range of motion. Neck supple. No JVD present. No tracheal deviation present. No thyromegaly present.  Cardiovascular: Normal rate, regular rhythm, normal heart sounds and intact distal pulses.  Exam reveals no gallop and no friction rub.   No murmur heard. No carotid bruit  Pulmonary/Chest: Effort normal and breath sounds normal. Stridor present. No respiratory distress. She has  no wheezes. She has no rales. She exhibits no tenderness.  Abdominal: Soft. Bowel sounds are normal. She exhibits no distension and no mass. There is no tenderness. There is no rebound and no guarding.  Genitourinary:  Refused breast exam  Musculoskeletal: Normal range of motion.  Lymphadenopathy:    She has no cervical adenopathy.  Neurological: She is alert and oriented to person, place, and time. No cranial nerve deficit. Coordination normal.  Skin: Skin is warm and dry. No rash noted. She is not diaphoretic. No erythema. No pallor.  Psychiatric: She has a normal mood and affect. Her behavior is normal. Judgment and thought content normal.  Nursing note and vitals reviewed.      Assessment & Plan:  1. Routine general medical examination at a health care facility - She had her labs drawn 3 months ago at cardiology. At that time she was diagnosed with diabetes. Most recent A1c was within a week. Her AST/ALT were elevated at that time, and likely from diet.  - Continue to work on diet and exercise - Follow up in three months or sooner if needed  2. Abnormal liver enzymes - Likely from diet.  - Hepatic function panel  3. Need for Tdap vaccination - Tdap vaccine greater than or equal to 7yo IM  4. Essential hypertension -  Contolled on current medication - As she loses weight through diet and exercise I hope to be able to take her off some of the medications.  - She would like to try taking 1/2 cozaar  and see how her BP responds. Advised to restart full dose if BP trends towards 140's  5. Post-menopausal - DG Bone Density; Future

## 2015-03-03 NOTE — Addendum Note (Signed)
Addended by: Apolinar Junes on: 03/03/2015 12:11 PM   Modules accepted: Orders

## 2015-03-03 NOTE — Patient Instructions (Addendum)
It was great seeing you again!  You are doing a great jobs managing your diabetes. Please continue to diet and exercise.   Follow up with me in 3 months for your next A1c check.   You can cut your Cozaar in half and see how your blood pressure responds. If you notice that your blood pressures are trending towards the 140's, then please resume the full dose.     Menopause is a normal process in which your reproductive ability comes to an end. This process happens gradually over a span of months to years, usually between the ages of 2 and 78. Menopause is complete when you have missed 12 consecutive menstrual periods. It is important to talk with your health care provider about some of the most common conditions that affect postmenopausal women, such as heart disease, cancer, and bone loss (osteoporosis). Adopting a healthy lifestyle and getting preventive care can help to promote your health and wellness. Those actions can also lower your chances of developing some of these common conditions. WHAT SHOULD I KNOW ABOUT MENOPAUSE? During menopause, you may experience a number of symptoms, such as:  Moderate-to-severe hot flashes.  Night sweats.  Decrease in sex drive.  Mood swings.  Headaches.  Tiredness.  Irritability.  Memory problems.  Insomnia. Choosing to treat or not to treat menopausal changes is an individual decision that you make with your health care provider. WHAT SHOULD I KNOW ABOUT HORMONE REPLACEMENT THERAPY AND SUPPLEMENTS? Hormone therapy products are effective for treating symptoms that are associated with menopause, such as hot flashes and night sweats. Hormone replacement carries certain risks, especially as you become older. If you are thinking about using estrogen or estrogen with progestin treatments, discuss the benefits and risks with your health care provider. WHAT SHOULD I KNOW ABOUT HEART DISEASE AND STROKE? Heart disease, heart attack, and stroke become  more likely as you age. This may be due, in part, to the hormonal changes that your body experiences during menopause. These can affect how your body processes dietary fats, triglycerides, and cholesterol. Heart attack and stroke are both medical emergencies. There are many things that you can do to help prevent heart disease and stroke:  Have your blood pressure checked at least every 1-2 years. High blood pressure causes heart disease and increases the risk of stroke.  If you are 2-13 years old, ask your health care provider if you should take aspirin to prevent a heart attack or a stroke.  Do not use any tobacco products, including cigarettes, chewing tobacco, or electronic cigarettes. If you need help quitting, ask your health care provider.  It is important to eat a healthy diet and maintain a healthy weight.  Be sure to include plenty of vegetables, fruits, low-fat dairy products, and lean protein.  Avoid eating foods that are high in solid fats, added sugars, or salt (sodium).  Get regular exercise. This is one of the most important things that you can do for your health.  Try to exercise for at least 150 minutes each week. The type of exercise that you do should increase your heart rate and make you sweat. This is known as moderate-intensity exercise.  Try to do strengthening exercises at least twice each week. Do these in addition to the moderate-intensity exercise.  Know your numbers.Ask your health care provider to check your cholesterol and your blood glucose. Continue to have your blood tested as directed by your health care provider. WHAT SHOULD I KNOW ABOUT CANCER SCREENING?  There are several types of cancer. Take the following steps to reduce your risk and to catch any cancer development as early as possible. Breast Cancer  Practice breast self-awareness.  This means understanding how your breasts normally appear and feel.  It also means doing regular breast  self-exams. Let your health care provider know about any changes, no matter how small.  If you are 22 or older, have a clinician do a breast exam (clinical breast exam or CBE) every year. Depending on your age, family history, and medical history, it may be recommended that you also have a yearly breast X-ray (mammogram).  If you have a family history of breast cancer, talk with your health care provider about genetic screening.  If you are at high risk for breast cancer, talk with your health care provider about having an MRI and a mammogram every year.  Breast cancer (BRCA) gene test is recommended for women who have family members with BRCA-related cancers. Results of the assessment will determine the need for genetic counseling and BRCA1 and for BRCA2 testing. BRCA-related cancers include these types:  Breast. This occurs in males or females.  Ovarian.  Tubal. This may also be called fallopian tube cancer.  Cancer of the abdominal or pelvic lining (peritoneal cancer).  Prostate.  Pancreatic. Cervical, Uterine, and Ovarian Cancer Your health care provider may recommend that you be screened regularly for cancer of the pelvic organs. These include your ovaries, uterus, and vagina. This screening involves a pelvic exam, which includes checking for microscopic changes to the surface of your cervix (Pap test).  For women ages 21-65, health care providers may recommend a pelvic exam and a Pap test every three years. For women ages 63-65, they may recommend the Pap test and pelvic exam, combined with testing for human papilloma virus (HPV), every five years. Some types of HPV increase your risk of cervical cancer. Testing for HPV may also be done on women of any age who have unclear Pap test results.  Other health care providers may not recommend any screening for nonpregnant women who are considered low risk for pelvic cancer and have no symptoms. Ask your health care provider if a screening  pelvic exam is right for you.  If you have had past treatment for cervical cancer or a condition that could lead to cancer, you need Pap tests and screening for cancer for at least 20 years after your treatment. If Pap tests have been discontinued for you, your risk factors (such as having a new sexual partner) need to be reassessed to determine if you should start having screenings again. Some women have medical problems that increase the chance of getting cervical cancer. In these cases, your health care provider may recommend that you have screening and Pap tests more often.  If you have a family history of uterine cancer or ovarian cancer, talk with your health care provider about genetic screening.  If you have vaginal bleeding after reaching menopause, tell your health care provider.  There are currently no reliable tests available to screen for ovarian cancer. Lung Cancer Lung cancer screening is recommended for adults 58-59 years old who are at high risk for lung cancer because of a history of smoking. A yearly low-dose CT scan of the lungs is recommended if you:  Currently smoke.  Have a history of at least 30 pack-years of smoking and you currently smoke or have quit within the past 15 years. A pack-year is smoking an average of  one pack of cigarettes per day for one year. Yearly screening should:  Continue until it has been 15 years since you quit.  Stop if you develop a health problem that would prevent you from having lung cancer treatment. Colorectal Cancer  This type of cancer can be detected and can often be prevented.  Routine colorectal cancer screening usually begins at age 15 and continues through age 44.  If you have risk factors for colon cancer, your health care provider may recommend that you be screened at an earlier age.  If you have a family history of colorectal cancer, talk with your health care provider about genetic screening.  Your health care provider  may also recommend using home test kits to check for hidden blood in your stool.  A small camera at the end of a tube can be used to examine your colon directly (sigmoidoscopy or colonoscopy). This is done to check for the earliest forms of colorectal cancer.  Direct examination of the colon should be repeated every 5-10 years until age 14. However, if early forms of precancerous polyps or small growths are found or if you have a family history or genetic risk for colorectal cancer, you may need to be screened more often. Skin Cancer  Check your skin from head to toe regularly.  Monitor any moles. Be sure to tell your health care provider:  About any new moles or changes in moles, especially if there is a change in a mole's shape or color.  If you have a mole that is larger than the size of a pencil eraser.  If any of your family members has a history of skin cancer, especially at a young age, talk with your health care provider about genetic screening.  Always use sunscreen. Apply sunscreen liberally and repeatedly throughout the day.  Whenever you are outside, protect yourself by wearing long sleeves, pants, a wide-brimmed hat, and sunglasses. WHAT SHOULD I KNOW ABOUT OSTEOPOROSIS? Osteoporosis is a condition in which bone destruction happens more quickly than new bone creation. After menopause, you may be at an increased risk for osteoporosis. To help prevent osteoporosis or the bone fractures that can happen because of osteoporosis, the following is recommended:  If you are 37-69 years old, get at least 1,000 mg of calcium and at least 600 mg of vitamin D per day.  If you are older than age 56 but younger than age 30, get at least 1,200 mg of calcium and at least 600 mg of vitamin D per day.  If you are older than age 69, get at least 1,200 mg of calcium and at least 800 mg of vitamin D per day. Smoking and excessive alcohol intake increase the risk of osteoporosis. Eat foods that are  rich in calcium and vitamin D, and do weight-bearing exercises several times each week as directed by your health care provider. WHAT SHOULD I KNOW ABOUT HOW MENOPAUSE AFFECTS [place]? Depression may occur at any age, but it is more common as you become older. Common symptoms of depression include:  Low or sad mood.  Changes in sleep patterns.  Changes in appetite or eating patterns.  Feeling an overall lack of motivation or enjoyment of activities that you previously enjoyed.  Frequent crying spells. Talk with your health care provider if you think that you are experiencing depression. WHAT SHOULD I KNOW ABOUT IMMUNIZATIONS? It is important that you get and maintain your immunizations. These include:  Tetanus, diphtheria, and pertussis (Tdap)  booster vaccine.  Influenza every year before the flu season begins.  Pneumonia vaccine.  Shingles vaccine. Your health care provider may also recommend other immunizations.   This information is not intended to replace advice given to you by your health care provider. Make sure you discuss any questions you have with your health care provider.   Document Released: 04/29/2005 Document Revised: 03/28/2014 Document Reviewed: 11/07/2013 Elsevier Interactive Patient Education [employer].

## 2015-03-09 ENCOUNTER — Encounter: Payer: Self-pay | Admitting: Gastroenterology

## 2015-03-09 ENCOUNTER — Ambulatory Visit
Admission: RE | Admit: 2015-03-09 | Discharge: 2015-03-09 | Disposition: A | Discharge status: Home / Self Care | Payer: Medicare Other | Source: Ambulatory Visit | Attending: Adult Health | Admitting: Adult Health

## 2015-03-09 DIAGNOSIS — E2839 Other primary ovarian failure: Secondary | ICD-10-CM

## 2015-03-18 ENCOUNTER — Other Ambulatory Visit: Payer: Self-pay | Admitting: Cardiology

## 2015-03-18 DIAGNOSIS — F329 Major depressive disorder, single episode, unspecified: Secondary | ICD-10-CM

## 2015-03-18 DIAGNOSIS — F32A Depression, unspecified: Secondary | ICD-10-CM

## 2015-03-24 ENCOUNTER — Other Ambulatory Visit: Payer: Self-pay

## 2015-03-24 DIAGNOSIS — F329 Major depressive disorder, single episode, unspecified: Secondary | ICD-10-CM

## 2015-03-24 DIAGNOSIS — F32A Depression, unspecified: Secondary | ICD-10-CM

## 2015-03-25 MED ORDER — FLUOXETINE HCL 40 MG PO CAPS: 40.0000 mg | ORAL_CAPSULE | Freq: Every day | ORAL | Status: DC

## 2015-04-09 ENCOUNTER — Other Ambulatory Visit: Payer: Self-pay | Admitting: Cardiology

## 2015-05-04 ENCOUNTER — Encounter: Payer: Self-pay | Admitting: Cardiology

## 2015-05-04 ENCOUNTER — Ambulatory Visit (INDEPENDENT_AMBULATORY_CARE_PROVIDER_SITE_OTHER): Payer: Medicare Other | Admitting: Cardiology

## 2015-05-04 VITALS — BP 128/60 | HR 70 | Ht 61.0 in | Wt 191.0 lb

## 2015-05-04 DIAGNOSIS — E7800 Pure hypercholesterolemia, unspecified: Secondary | ICD-10-CM | POA: Diagnosis not present

## 2015-05-04 DIAGNOSIS — I119 Hypertensive heart disease without heart failure: Secondary | ICD-10-CM | POA: Diagnosis not present

## 2015-05-04 DIAGNOSIS — E119 Type 2 diabetes mellitus without complications: Secondary | ICD-10-CM

## 2015-05-04 NOTE — Patient Instructions (Signed)
Medication Instructions:  Your physician recommends that you continue on your current medications as directed. Please refer to the Current Medication list given to you today.  Labwork: none  Testing/Procedures: none  Follow-Up: As needed   If you need a refill on your cardiac medications before your next appointment, please call your pharmacy.  

## 2015-05-04 NOTE — Progress Notes (Signed)
Cardiology Office Note   Date:  05/04/2015   ID:  Brittany Mcclure, DOB [birth year], MRN [id number]  PCP:  Dorothyann Peng, NP  Cardiologist: Darlin Coco MD  No chief complaint on file.     History of Present Illness: Brittany Mcclure is a 75 y.o. female who presents for a scheduled follow-up visit  This pleasant 75 year old woman is seen for a scheduled followup office visit. . She has a past history of essential hypertension and history of dyslipidemia and metabolic syndrome.  She is diabetic.  Initially her hemoglobin A1c was greater than 11.  It has improved significantly.  She is tolerating metformin 500 mg one daily She has a difficult time with weight loss. She's had a known previous right carotid bruit with normal Dopplers in October 2010. The patient does not have any history of ischemic heart disease and she did have a nuclear stress test on 10/07/09 which showed no ischemia and her ejection fraction was 81%. Since last visit she's had no new cardiac symptoms. She has a history of hypertension. She has lost 14 pounds since September and her blood pressures are now running normal. Past Medical History  Diagnosis Date  . Exogenous obesity   . Metabolic syndrome   . Hypertension   . Carotid bruit     right  . Chest pain   . Hyperlipidemia   . Seasonal allergies     seasonal  . Anxiety   . Arthritis     with gout  . Cataract   . GERD (gastroesophageal reflux disease)   . Diabetes type 2, uncontrolled ([place])   . Diverticulosis   . Depression   . Colon polyps     Past Surgical History  Procedure Laterality Date  . Foot surgery      x 2  . Tubal ligation    . Dilation and curettage of uterus    . Colonoscopy      11/90,11/91,1994,2000,2010  . Polypectomy    . Hand surgery    . Uterine polyps removed      x2     Current Outpatient Prescriptions  Medication Sig Dispense Refill  . allopurinol (ZYLOPRIM) 100 MG tablet TAKE 2 TABLETS BY MOUTH EVERY DAY 60  tablet 5  . ALPRAZolam (XANAX) 0.25 MG tablet TAKE 1 TABLET BY MOUTH TWICE DAILY. 60 tablet 3  . bisoprolol-hydrochlorothiazide (ZIAC) 10-6.25 MG tablet TAKE 1 TABLET BY MOUTH DAILY 30 tablet 6  . Cholecalciferol (D3-1000) 1000 UNITS capsule Take 1,000 Units by mouth daily.    . clobetasol (TEMOVATE) 0.05 % external solution Apply 1 application topically as needed.    . Cyanocobalamin (VITAMIN B 12 PO) Take 1 tablet by mouth daily.     . fluocinonide (LIDEX) 0.05 % cream Apply 1 application topically daily as needed (skin irritation).     Marland Kitchen FLUoxetine (PROZAC) 40 MG capsule Take 1 capsule (40 mg total) by mouth daily. Additional refills from PCP 90 capsule 0  . glucose blood (ACCU-CHEK SMARTVIEW) test strip Use to check blood sugar three times a day and PRN,  Dx code: E11.65 100 each 12  . ketoconazole (NIZORAL) 2 % cream Apply 1 application topically daily as needed (skin irritation).     . Lancets (ACCU-CHEK SOFT TOUCH) lancets Use as instructed 100 each 12  . lansoprazole (PREVACID) 15 MG capsule Take 15 mg by mouth as needed (heartburn or indigestion).     Marland Kitchen losartan (COZAAR) 25 MG tablet Take 1 tablet (25  mg total) by mouth daily. 30 tablet 5  . metFORMIN (GLUCOPHAGE) 500 MG tablet Take 1 tablet (500 mg total) by mouth daily with breakfast. 30 tablet 5  . Omega-3 Fatty Acids (FISH OIL) 1000 MG CAPS Take 2 capsules by mouth daily.      No current facility-administered medications for this visit.    Allergies:   Crestor; Lipitor; Lovastatin; Sulfonamide derivatives; Sulfur; and Zetia    Social History:  The patient  reports that she has quit smoking. She has never used smokeless tobacco. She reports that she drinks alcohol. She reports that she does not use illicit drugs.   Family History:  The patient's family history includes Colon cancer in her mother; Diabetes in her maternal grandmother; Heart failure in her maternal grandmother; Hypertension in her father; Stroke in her father. There  is no history of Rectal cancer or Stomach cancer.    ROS:  Please see the history of present illness.   Otherwise, review of systems are positive for none.   All other systems are reviewed and negative.    PHYSICAL EXAM: VS:  BP 128/60 mmHg  Pulse 70  Ht 5\' 1"  (1.549 m)  Wt 191 lb (86.637 kg)  BMI 36.11 kg/m2 , BMI Body mass index is 36.11 kg/(m^2). GEN: Well nourished, well developed, in no acute distress HEENT: normal Neck: no JVD, carotid bruits, or masses Cardiac: RRR; no murmurs, rubs, or gallops,no edema  Respiratory:  clear to auscultation bilaterally, normal work of breathing GI: soft, nontender, nondistended, + BS MS: no deformity or atrophy Skin: warm and dry, no rash Neuro:  Strength and sensation are intact Psych: euthymic mood, full affect   EKG:  EKG is not ordered today.   Recent Labs: 12/01/2014: Hemoglobin 13.2; Platelets 295.0; TSH 1.60 12/16/2014: BUN 22; Creatinine, Ser 1.10; Potassium 4.9; Sodium 136 03/03/2015: ALT 22    Lipid Panel    Component Value Date/Time   CHOL 178 12/01/2014 1004   TRIG 191.0* 12/01/2014 1004   HDL 48.60 12/01/2014 1004   CHOLHDL 4 12/01/2014 1004   VLDL 38.2 12/01/2014 1004   LDLCALC 91 12/01/2014 1004   LDLDIRECT 136.7 02/15/2013 1046      Wt Readings from Last 3 Encounters:  05/04/15 191 lb (86.637 kg)  03/03/15 192 lb 12.8 oz (87.454 kg)  01/20/15 199 lb 12.8 oz (90.629 kg)         ASSESSMENT AND PLAN:  1.  essential hypertension without heart failure, not adequately controlled 2. metabolic syndrome with DM type II 3. Dyslipidemia 4. Previous right carotid bruit with normal Dopplers 2010 5. hyperuricemia with history of gout right hand 6. Hyperglycemia 7. Malaise and fatigue Current medicines are reviewed at length with the patient today. The patient does not have concerns regarding medicines.  The following changes have been made: We will start her on losartan 25 mg one daily to help with blood  pressure control.   Current medicines are reviewed at length with the patient today.  The patient does not have concerns regarding medicines.  The following changes have been made:  no change  Labs/ tests ordered today include:  No orders of the defined types were placed in this encounter.    Disposition: She will continue current medication.  She is no longer on ezetimibe because it was causing her to have a dry cough.  She will return to cardiology on an as-needed basis.  She will continue medical follow-up with Dorothyann Peng NP  Signed, Darlin Coco  MD 05/04/2015 5:46 PM    [facility] [place], [place],   [address] Phone: [phone]; Fax: [phone]

## 2015-05-21 ENCOUNTER — Other Ambulatory Visit: Payer: Self-pay | Admitting: Cardiology

## 2015-06-10 ENCOUNTER — Encounter: Payer: Medicare Other | Attending: Adult Health | Admitting: Skilled Nursing Facility1

## 2015-06-10 VITALS — Ht 61.0 in | Wt 191.3 lb

## 2015-06-10 DIAGNOSIS — E119 Type 2 diabetes mellitus without complications: Secondary | ICD-10-CM

## 2015-06-10 DIAGNOSIS — E1165 Type 2 diabetes mellitus with hyperglycemia: Secondary | ICD-10-CM | POA: Diagnosis present

## 2015-06-11 ENCOUNTER — Other Ambulatory Visit: Payer: Self-pay | Admitting: Cardiology

## 2015-06-11 NOTE — Telephone Encounter (Signed)
Should this be deferred to pcp as patient is prn follow up with cardiology?

## 2015-06-12 ENCOUNTER — Encounter: Payer: Self-pay | Admitting: Skilled Nursing Facility1

## 2015-06-12 NOTE — Progress Notes (Signed)
Diabetes Self-Management Education  Visit Type:  4 Month Follow-Up  Appt. Start Time: 5:30 Appt. End Time: 6:30  06/12/2015  Brittany Mcclure, identified by name and date of birth, is a 75 y.o. female with a diagnosis of Diabetes:  .   ASSESSMENT  Height 5\' 1"  (1.549 m), weight 191 lb 4.8 oz (86.773 kg). Body mass index is 36.16 kg/(m^2).  Pt states since last appointment: she has 1 carb serving for meals and 1 for snack (this was addressed in class) and has been logging her glucose readings regularly. Pt states her A1C is now 7.6.     Diabetes Self-Management Education - 06/12/15 1121    Health Coping   How would you rate your overall health? Excellent   Psychosocial Assessment   Patient Belief/Attitude about Diabetes Motivated to manage diabetes   Self-care barriers None   Self-management support Family   Complications   How often do you check your blood sugar? 1-2 times/day   Have you had a dilated eye exam in the past 12 months? Yes   Have you had a dental exam in the past 12 months? Yes   Are you checking your feet? Yes   How many days per week are you checking your feet? 7   Exercise   Exercise Type Light (walking / raking leaves)   How many days per week to you exercise? 5   Patient Education   Previous Diabetes Education Yes (please comment)   Outcomes   Program Status Completed   Subsequent Visit   Since your last visit have you continued or begun to take your medications as prescribed? Yes   Since your last visit have you experienced any weight changes? Loss   Since your last visit, are you checking your blood glucose at least once a day? Yes      Learning Objective:  Patient will have a greater understanding of diabetes self-management. Patient education plan is to attend individual and/or group sessions per assessed needs and concerns.   Plan:   There are no Patient Instructions on file for this visit.   Expected Outcomes:  Demonstrated interest in  learning. Expect positive outcomes  Education material provided: Living Well with Diabetes  If problems or questions, patient to contact team via:  Phone  Future DSME appointment: - PRN

## 2015-06-20 ENCOUNTER — Other Ambulatory Visit: Payer: Self-pay | Admitting: Cardiology

## 2015-06-22 ENCOUNTER — Ambulatory Visit (INDEPENDENT_AMBULATORY_CARE_PROVIDER_SITE_OTHER): Payer: Medicare Other | Admitting: Adult Health

## 2015-06-22 ENCOUNTER — Encounter: Payer: Self-pay | Admitting: Adult Health

## 2015-06-22 VITALS — BP 136/62 | Temp 98.0°F | Ht 61.0 in | Wt 190.8 lb

## 2015-06-22 DIAGNOSIS — E1165 Type 2 diabetes mellitus with hyperglycemia: Secondary | ICD-10-CM | POA: Diagnosis not present

## 2015-06-22 DIAGNOSIS — IMO0001 Reserved for inherently not codable concepts without codable children: Secondary | ICD-10-CM

## 2015-06-22 DIAGNOSIS — I10 Essential (primary) hypertension: Secondary | ICD-10-CM | POA: Diagnosis not present

## 2015-06-22 LAB — BASIC METABOLIC PANEL
BUN: 22 mg/dL (ref 6–23)
CALCIUM: 9.8 mg/dL (ref 8.4–10.5)
CO2: 30 mEq/L (ref 19–32)
CREATININE: 1.03 mg/dL (ref 0.40–1.20)
Chloride: 99 mEq/L (ref 96–112)
GFR: 55.24 mL/min — AB (ref >60.00)
Glucose, Bld: 78 mg/dL (ref 70–99)
Potassium: 4.5 mEq/L (ref 3.5–5.1)
SODIUM: 136 meq/L (ref 135–145)

## 2015-06-22 LAB — HEMOGLOBIN A1C: Hgb A1c MFr Bld: 6.4 % (ref 4.6–6.5)

## 2015-06-22 MED ORDER — LOSARTAN POTASSIUM 25 MG PO TABS: 25.0000 mg | ORAL_TABLET | Freq: Every day | ORAL | Status: DC

## 2015-06-22 NOTE — Patient Instructions (Signed)
It was great seeing you again!  I will follow up with you regarding your blood work.  I have sent in a prescription for Losartan  Try increasing the amount of exercise and continue to monitor your blood pressure and sugars at home.  Follow up in 3 months.

## 2015-06-22 NOTE — Progress Notes (Signed)
Pre visit review using our clinic review tool, if applicable. No additional management support is needed unless otherwise documented below in the visit note. 

## 2015-06-22 NOTE — Telephone Encounter (Signed)
Left message to call back  Denied Rx's for Prozac and Metformin, needs to get from PCP

## 2015-06-22 NOTE — Progress Notes (Signed)
Subjective:    Patient ID: Brittany Mcclure, female    DOB: 1940-03-30, 75 y.o.   MRN: [id number]  HPI  75 year old female who presents to the office today for three month follow up. I last saw her in December at which time her A1c went from 11.3 to 7.6.   She saw her Cardiologist on 05/04/2015 .She was started on Losartan 25mg  daily for blood pressure control.  She is eating healthy and is walking. She is up to 2 miles at a time. She walks some every week.   She reports that her sugars are "doing really well" Her log shows blood sugars in the 90-120's.  She is having trouble with losing weight. Portion control seems to be the biggest issue with her.   Wt Readings from Last 3 Encounters:  06/22/15 190 lb 12.8 oz (86.546 kg)  06/12/15 191 lb 4.8 oz (86.773 kg)  05/04/15 191 lb (86.637 kg)   She has an acute gout flare in her right pointer finger and feels as though she is starting to have a gout flare in her left pointer finger. She has an appointment with her hand doctor at 1:30 today.   Review of Systems  Constitutional: Negative.   Respiratory: Negative.   Cardiovascular: Negative.   Musculoskeletal: Negative.   Neurological: Negative.   All other systems reviewed and are negative.  Past Medical History  Diagnosis Date  . Exogenous obesity   . Metabolic syndrome   . Hypertension   . Carotid bruit     right  . Chest pain   . Hyperlipidemia   . Seasonal allergies     seasonal  . Anxiety   . Arthritis     with gout  . Cataract   . GERD (gastroesophageal reflux disease)   . Diabetes type 2, uncontrolled ([place])   . Diverticulosis   . Depression   . Colon polyps     Social History   Social History  . Marital Status: Widowed    Spouse Name: N/A  . Number of Children: 2  . Years of Education: N/A   Occupational History  . retired    Social History Main Topics  . Smoking status: Former Research scientist (life sciences)  . Smokeless tobacco: Never Used  . Alcohol Use: 0.0 oz/week    0  Standard drinks or equivalent per week     Comment: socially  . Drug Use: No  . Sexual Activity: Not on file   Other Topics Concern  . Not on file   Social History Narrative   Two years of college. She is retired from Scientist, physiological, worked as a Psychologist, occupational at a middle school.    Widowed for two years, married for 52 years   Has two children. Son lives in [place]. Daughter lives locally. 4 grandchildren.     Past Surgical History  Procedure Laterality Date  . Foot surgery      x 2  . Tubal ligation    . Dilation and curettage of uterus    . Colonoscopy      11/90,11/91,1994,2000,2010  . Polypectomy    . Hand surgery    . Uterine polyps removed      x2    Family History  Problem Relation Age of Onset  . Colon cancer Mother   . Stroke Father   . Hypertension Father   . Rectal cancer Neg Hx   . Stomach cancer Neg Hx   . Heart failure Maternal Grandmother   .  Diabetes Maternal Grandmother     ?    Allergies  Allergen Reactions  . Crestor [Rosuvastatin Calcium]     Myalgias   . Lipitor [Atorvastatin Calcium]     Myalgias   . Lovastatin     myalgias  . Sulfonamide Derivatives     REACTION: n\T\v  . Sulfur     Made infection worse  . Zetia [Ezetimibe]     Cough, dry    Current Outpatient Prescriptions on File Prior to Visit  Medication Sig Dispense Refill  . allopurinol (ZYLOPRIM) 100 MG tablet TAKE 2 TABLETS BY MOUTH EVERY DAY 60 tablet 0  . ALPRAZolam (XANAX) 0.25 MG tablet TAKE 1 TABLET BY MOUTH TWICE DAILY. 60 tablet 3  . bisoprolol-hydrochlorothiazide (ZIAC) 10-6.25 MG tablet TAKE 1 TABLET BY MOUTH DAILY 30 tablet 6  . Cholecalciferol (D3-1000) 1000 UNITS capsule Take 1,000 Units by mouth daily.    . clobetasol (TEMOVATE) 0.05 % external solution Apply 1 application topically as needed.    . Cyanocobalamin (VITAMIN B 12 PO) Take 1 tablet by mouth daily.     . fluocinonide (LIDEX) 0.05 % cream Apply 1 application topically daily as needed (skin irritation).       Marland Kitchen FLUoxetine (PROZAC) 40 MG capsule Take 1 capsule (40 mg total) by mouth daily. Additional refills from PCP 90 capsule 0  . glucose blood (ACCU-CHEK SMARTVIEW) test strip Use to check blood sugar three times a day and PRN,  Dx code: E11.65 100 each 12  . ketoconazole (NIZORAL) 2 % cream Apply 1 application topically daily as needed (skin irritation).     . Lancets (ACCU-CHEK SOFT TOUCH) lancets Use as instructed 100 each 12  . lansoprazole (PREVACID) 15 MG capsule Take 15 mg by mouth as needed (heartburn or indigestion).     Marland Kitchen losartan (COZAAR) 25 MG tablet Take 1 tablet (25 mg total) by mouth daily. 30 tablet 5  . metFORMIN (GLUCOPHAGE) 500 MG tablet TAKE 1 TABLET(500 MG) BY MOUTH DAILY WITH BREAKFAST 30 tablet 0  . Omega-3 Fatty Acids (FISH OIL) 1000 MG CAPS Take 2 capsules by mouth daily.      No current facility-administered medications on file prior to visit.    BP 136/62 mmHg  Temp(Src) 98 F (36.7 C) (Oral)  Ht 5\' 1"  (1.549 m)  Wt 190 lb 12.8 oz (86.546 kg)  BMI 36.07 kg/m2       Objective:   Physical Exam  Constitutional: She is oriented to person, place, and time. She appears well-developed and well-nourished. No distress.  Cardiovascular: Normal rate, regular rhythm, normal heart sounds and intact distal pulses.  Exam reveals no gallop and no friction rub.   No murmur heard. Pulmonary/Chest: Effort normal and breath sounds normal. No respiratory distress. She has no wheezes. She has no rales. She exhibits no tenderness.  Neurological: She is alert and oriented to person, place, and time.  Skin: Skin is warm and dry. No rash noted. She is not diaphoretic. No erythema. No pallor.  Psychiatric: She has a normal mood and affect. Her behavior is normal. Judgment and thought content normal.  Nursing note and vitals reviewed.     Assessment & Plan:  1. Uncontrolled type 2 diabetes mellitus without complication, without long-term current use of insulin (HCC) - Hemoglobin  A1c - Increase exercise and work on portion control.  - Will lcheck A1c in three month -Consider decreasing Metformin - Continue to monitor at home  2. Essential hypertension - Basic metabolic panel -  Continue to monitor at home.  - Bring log to next appointment.

## 2015-06-22 NOTE — Telephone Encounter (Signed)
Spoke to patient and informed her of her A1c. She is gone from 11.3 to 6.4 in the matter of 6 months

## 2015-06-23 ENCOUNTER — Other Ambulatory Visit: Payer: Self-pay | Admitting: Adult Health

## 2015-06-23 ENCOUNTER — Telehealth: Payer: Self-pay | Admitting: Cardiology

## 2015-06-23 ENCOUNTER — Telehealth: Payer: Self-pay | Admitting: Adult Health

## 2015-06-23 DIAGNOSIS — F329 Major depressive disorder, single episode, unspecified: Secondary | ICD-10-CM

## 2015-06-23 DIAGNOSIS — F32A Depression, unspecified: Secondary | ICD-10-CM

## 2015-06-23 MED ORDER — BISOPROLOL-HYDROCHLOROTHIAZIDE 10-6.25 MG PO TABS: 1.0000 | ORAL_TABLET | Freq: Every day | ORAL | Status: DC

## 2015-06-23 MED ORDER — METFORMIN HCL 500 MG PO TABS: ORAL_TABLET | ORAL | Status: DC

## 2015-06-23 MED ORDER — ALLOPURINOL 100 MG PO TABS: 200.0000 mg | ORAL_TABLET | Freq: Every day | ORAL | Status: DC

## 2015-06-23 MED ORDER — FLUOXETINE HCL 40 MG PO CAPS: 40.0000 mg | ORAL_CAPSULE | Freq: Every day | ORAL | Status: DC

## 2015-06-23 NOTE — Telephone Encounter (Signed)
FYI

## 2015-06-23 NOTE — Telephone Encounter (Signed)
Mrs. Sachs is returning your call , Please call .Marland Kitchen Thanks

## 2015-06-23 NOTE — Telephone Encounter (Signed)
Sent in to pharmacy.  

## 2015-06-23 NOTE — Telephone Encounter (Signed)
Spoke with patient regarding recent refill requests.  Patient had asked pharmacy to contact PCP, she will follow up with them

## 2015-06-23 NOTE — Telephone Encounter (Signed)
Pt is supposed t let you know she is taking colchicine  .6 mg  Dr Fredna Dow prescribed   1/ day  Pt request refill   bisoprolol-hydrochlorothiazide (ZIAC) 10-6.25 MG tablet allopurinol (ZYLOPRIM) 100 MG tablet FLUoxetine (PROZAC) 40 MG capsule metFORMIN (GLUCOPHAGE) 500 MG tablet  90 day Dr Mare Ferrari was prescribing, but he has retired and they want pt to get from her pcp.   [facility] [address]

## 2015-06-24 NOTE — Telephone Encounter (Signed)
Thanks

## 2015-07-30 ENCOUNTER — Telehealth: Payer: Self-pay | Admitting: Adult Health

## 2015-07-30 NOTE — Telephone Encounter (Signed)
I need to have an updated liver panel before prescribing Uloric. She also needs to be seen before starting any weight loss medication

## 2015-07-30 NOTE — Telephone Encounter (Signed)
See below and advise

## 2015-07-30 NOTE — Telephone Encounter (Signed)
Pt would like to try uloric for gout.Pt also is dm has lose some weight but now at stand still and would like wt loss med call into [facility] [address]

## 2015-07-31 NOTE — Telephone Encounter (Signed)
Left message advising patient to schedule an appointment for gout and weight loss issues.

## 2015-08-05 ENCOUNTER — Ambulatory Visit (INDEPENDENT_AMBULATORY_CARE_PROVIDER_SITE_OTHER): Payer: Medicare Other | Admitting: Adult Health

## 2015-08-05 ENCOUNTER — Encounter: Payer: Self-pay | Admitting: Adult Health

## 2015-08-05 VITALS — BP 104/58 | Temp 97.8°F

## 2015-08-05 DIAGNOSIS — M25449 Effusion, unspecified hand: Secondary | ICD-10-CM | POA: Diagnosis not present

## 2015-08-05 LAB — URIC ACID: URIC ACID, SERUM: 4.9 mg/dL (ref 2.4–7.0)

## 2015-08-05 LAB — HEPATIC FUNCTION PANEL
ALT: 17 U/L (ref 0–35)
AST: 20 U/L (ref 0–37)
Albumin: 4.1 g/dL (ref 3.5–5.2)
Alkaline Phosphatase: 87 U/L (ref 39–117)
BILIRUBIN DIRECT: 0.1 mg/dL (ref 0.0–0.3)
BILIRUBIN TOTAL: 0.4 mg/dL (ref 0.2–1.2)
Total Protein: 6.8 g/dL (ref 6.0–8.3)

## 2015-08-05 LAB — SEDIMENTATION RATE: SED RATE: 37 mm/h — AB (ref 0–22)

## 2015-08-05 LAB — C-REACTIVE PROTEIN: CRP: 1.4 mg/dL (ref 0.5–20.0)

## 2015-08-05 NOTE — Progress Notes (Signed)
Subjective:    Patient ID: Brittany Mcclure, female    DOB: 20-Jan-1941, 75 y.o.   MRN: [id number]  HPI  75 year old female who presents to the office today for continued " gout flare" in her right thumb and left pointer finger. I last saw her for this issue on 06/22/2015. She has been taking Allopurinol daily. She reports that she continues to have swelling in her right thumb and left pointer finger. Her fingers are not red or warm. She does have pain with ROM exercises. Currently she reports that the pain is worse in the morning and at night. It does not go away completely during the day but it gets better.     Review of Systems  Constitutional: Negative.   Respiratory: Negative.   Cardiovascular: Negative.   Musculoskeletal: Positive for joint swelling and arthralgias. Negative for myalgias.  Skin: Negative.    Past Medical History  Diagnosis Date  . Exogenous obesity   . Metabolic syndrome   . Hypertension   . Carotid bruit     right  . Chest pain   . Hyperlipidemia   . Seasonal allergies     seasonal  . Anxiety   . Arthritis     with gout  . Cataract   . GERD (gastroesophageal reflux disease)   . Diabetes type 2, uncontrolled ([place])   . Diverticulosis   . Depression   . Colon polyps     Social History   Social History  . Marital Status: Widowed    Spouse Name: N/A  . Number of Children: 2  . Years of Education: N/A   Occupational History  . retired    Social History Main Topics  . Smoking status: Former Research scientist (life sciences)  . Smokeless tobacco: Never Used  . Alcohol Use: 0.0 oz/week    0 Standard drinks or equivalent per week     Comment: socially  . Drug Use: No  . Sexual Activity: Not on file   Other Topics Concern  . Not on file   Social History Narrative   Two years of college. She is retired from Scientist, physiological, worked as a Psychologist, occupational at a middle school.    Widowed for two years, married for 52 years   Has two children. Son lives in [place]. Daughter lives locally. 4  grandchildren.     Past Surgical History  Procedure Laterality Date  . Foot surgery      x 2  . Tubal ligation    . Dilation and curettage of uterus    . Colonoscopy      11/90,11/91,1994,2000,2010  . Polypectomy    . Hand surgery    . Uterine polyps removed      x2    Family History  Problem Relation Age of Onset  . Colon cancer Mother   . Stroke Father   . Hypertension Father   . Rectal cancer Neg Hx   . Stomach cancer Neg Hx   . Heart failure Maternal Grandmother   . Diabetes Maternal Grandmother     ?    Allergies  Allergen Reactions  . Crestor [Rosuvastatin Calcium]     Myalgias   . Lipitor [Atorvastatin Calcium]     Myalgias   . Lovastatin     myalgias  . Sulfonamide Derivatives     REACTION: n\T\v  . Sulfur     Made infection worse  . Zetia [Ezetimibe]     Cough, dry    Current Outpatient Prescriptions  on File Prior to Visit  Medication Sig Dispense Refill  . allopurinol (ZYLOPRIM) 100 MG tablet Take 2 tablets (200 mg total) by mouth daily. 180 tablet 3  . ALPRAZolam (XANAX) 0.25 MG tablet TAKE 1 TABLET BY MOUTH TWICE DAILY. 60 tablet 3  . bisoprolol-hydrochlorothiazide (ZIAC) 10-6.25 MG tablet Take 1 tablet by mouth daily. 90 tablet 3  . Cholecalciferol (D3-1000) 1000 UNITS capsule Take 1,000 Units by mouth daily.    . clobetasol (TEMOVATE) 0.05 % external solution Apply 1 application topically as needed.    . Cyanocobalamin (VITAMIN B 12 PO) Take 1 tablet by mouth daily.     . fluocinonide (LIDEX) 0.05 % cream Apply 1 application topically daily as needed (skin irritation).     Marland Kitchen FLUoxetine (PROZAC) 40 MG capsule Take 1 capsule (40 mg total) by mouth daily. Additional refills from PCP 90 capsule 1  . glucose blood (ACCU-CHEK SMARTVIEW) test strip Use to check blood sugar three times a day and PRN,  Dx code: E11.65 100 each 12  . ketoconazole (NIZORAL) 2 % cream Apply 1 application topically daily as needed (skin irritation).     . Lancets (ACCU-CHEK  SOFT TOUCH) lancets Use as instructed 100 each 12  . lansoprazole (PREVACID) 15 MG capsule Take 15 mg by mouth as needed (heartburn or indigestion).     Marland Kitchen losartan (COZAAR) 25 MG tablet Take 1 tablet (25 mg total) by mouth daily. 30 tablet 5  . metFORMIN (GLUCOPHAGE) 500 MG tablet TAKE 1 TABLET(500 MG) BY MOUTH DAILY WITH BREAKFAST 90 tablet 3  . Omega-3 Fatty Acids (FISH OIL) 1000 MG CAPS Take 2 capsules by mouth daily.      No current facility-administered medications on file prior to visit.    BP 104/58 mmHg  Temp(Src) 97.8 F (36.6 C) (Oral)       Objective:   Physical Exam  Constitutional: She is oriented to person, place, and time. She appears well-developed and well-nourished. No distress.  Cardiovascular: Normal rate, regular rhythm, normal heart sounds and intact distal pulses.  Exam reveals no gallop and no friction rub.   No murmur heard. Pulmonary/Chest: Effort normal and breath sounds normal. No respiratory distress. She has no wheezes. She has no rales. She exhibits no tenderness.  Musculoskeletal: She exhibits edema and tenderness.  Neurological: She is alert and oriented to person, place, and time.  Skin: Skin is warm and dry. No rash noted. She is not diaphoretic. No erythema. No pallor.  Psychiatric: She has a normal mood and affect. Her behavior is normal. Judgment and thought content normal.  Nursing note and vitals reviewed.     Assessment & Plan:  1. Swelling of finger joint, unspecified laterality - At this time, I am no longer thinking gout flare. I will rule out RA or other inflammatory process. Likely osteoarthritis - Hepatic function panel - Rheumatoid Factor - Sedimentation Rate - C-reactive Protein - Uric Acid - Consider referral to Rheumatology  - Consider mobic  - Follow up as needed  Dorothyann Peng, NP

## 2015-08-05 NOTE — Patient Instructions (Addendum)
It was great seeing you again.   I would like to get some lab on you to try and find out what it going on with your fingers.   You can continue to take Aleve or Ibuprofen, which will help with the swelling and pain.   I will follow up with you once we get the results.   Enjoy DC!

## 2015-08-06 LAB — RHEUMATOID FACTOR: Rhuematoid fact SerPl-aCnc: <10 IU/mL (ref <=14)

## 2015-08-18 ENCOUNTER — Telehealth: Payer: Self-pay | Admitting: Internal Medicine

## 2015-08-19 NOTE — Telephone Encounter (Signed)
Pt was hospitalized in [place] with gi bleed. Pt released and told to follow-up with GI. States she was in ICU there for several days. Pt scheduled to see Dr.Pyrtle 08/21/15@4pm . Pt aware of appt and states she will bring her records with her.

## 2015-08-20 ENCOUNTER — Encounter: Payer: Self-pay | Admitting: *Deleted

## 2015-08-21 ENCOUNTER — Ambulatory Visit (INDEPENDENT_AMBULATORY_CARE_PROVIDER_SITE_OTHER): Payer: Medicare Other | Admitting: Internal Medicine

## 2015-08-21 ENCOUNTER — Encounter: Payer: Self-pay | Admitting: Adult Health

## 2015-08-21 ENCOUNTER — Ambulatory Visit (INDEPENDENT_AMBULATORY_CARE_PROVIDER_SITE_OTHER): Payer: Medicare Other | Admitting: Adult Health

## 2015-08-21 ENCOUNTER — Encounter: Payer: Self-pay | Admitting: Internal Medicine

## 2015-08-21 VITALS — BP 120/46 | Temp 98.2°F | Ht 61.0 in | Wt 186.9 lb

## 2015-08-21 VITALS — BP 124/48 | HR 68 | Ht 61.0 in | Wt 186.5 lb

## 2015-08-21 DIAGNOSIS — D62 Acute posthemorrhagic anemia: Secondary | ICD-10-CM | POA: Diagnosis not present

## 2015-08-21 DIAGNOSIS — K922 Gastrointestinal hemorrhage, unspecified: Secondary | ICD-10-CM | POA: Diagnosis not present

## 2015-08-21 LAB — CBC WITH DIFFERENTIAL/PLATELET
BASOS ABS: 0.1 10*3/uL (ref 0.0–0.1)
Basophils Relative: 0.5 % (ref 0.0–3.0)
EOS ABS: 0.4 10*3/uL (ref 0.0–0.7)
Eosinophils Relative: 3.4 % (ref 0.0–5.0)
Hemoglobin: 8.2 g/dL — ABNORMAL LOW (ref 12.0–15.0)
LYMPHS PCT: 18.5 % (ref 12.0–46.0)
Lymphs Abs: 2.1 10*3/uL (ref 0.7–4.0)
MCHC: 32.5 g/dL (ref 30.0–36.0)
MCV: 94.1 fl (ref 78.0–100.0)
Monocytes Absolute: 0.7 10*3/uL (ref 0.1–1.0)
Monocytes Relative: 5.8 % (ref 3.0–12.0)
NEUTROS ABS: 8.2 10*3/uL — AB (ref 1.4–7.7)
NEUTROS PCT: 71.8 % (ref 43.0–77.0)
PLATELETS: 297 10*3/uL (ref 150.0–400.0)
RBC: 2.69 Mil/uL — ABNORMAL LOW (ref 3.87–5.11)
RDW: 15.6 % — ABNORMAL HIGH (ref 11.5–15.5)
WBC: 11.4 10*3/uL — ABNORMAL HIGH (ref 4.0–10.5)

## 2015-08-21 LAB — BASIC METABOLIC PANEL
BUN: 23 mg/dL (ref 6–23)
CHLORIDE: 103 meq/L (ref 96–112)
CO2: 27 mEq/L (ref 19–32)
Calcium: 9.3 mg/dL (ref 8.4–10.5)
Creatinine, Ser: 1.12 mg/dL (ref 0.40–1.20)
GFR: 50.13 mL/min — ABNORMAL LOW (ref >60.00)
Glucose, Bld: 102 mg/dL — ABNORMAL HIGH (ref 70–99)
POTASSIUM: 4.6 meq/L (ref 3.5–5.1)
SODIUM: 138 meq/L (ref 135–145)

## 2015-08-21 MED ORDER — LANSOPRAZOLE 30 MG PO CPDR: 30.0000 mg | DELAYED_RELEASE_CAPSULE | Freq: Two times a day (BID) | ORAL | Status: DC

## 2015-08-21 NOTE — Patient Instructions (Signed)
You have been scheduled for an endoscopy. Please follow written instructions given to you at your visit today. If you use inhalers (even only as needed), please bring them with you on the day of your procedure. Your physician has requested that you go to www.startemmi.com and enter the access code given to you at your visit today. This web site gives a general overview about your procedure. However, you should still follow specific instructions given to you by our office regarding your preparation for the procedure.  We have sent the following medications to your pharmacy for you to pick up at your convenience: Prevacid 30 mg twice daily (in place of 15 mg dosing)  Avoid NSAID's (i.e. Ibuprofen, naproxen)  Your physician has requested that you go to the basement for the following lab work on Monday: CBC  Please go to the emergency room immediately should you have any more signs of obvious bleeding.  If you are age 71 or older, your body mass index should be between 23-30. Your Body mass index is 35.26 kg/(m^2). If this is out of the aforementioned range listed, please consider follow up with your Primary Care Provider.  If you are age 42 or younger, your body mass index should be between 19-25. Your Body mass index is 35.26 kg/(m^2). If this is out of the aformentioned range listed, please consider follow up with your Primary Care Provider.

## 2015-08-21 NOTE — Patient Instructions (Signed)
It was great seeing you again and I am sorry this happened to you.   I will follow up with you regarding the blood work.   Follow up with GI this afternoon.   Continue to eat and drink   Please let me know if you need anything.

## 2015-08-21 NOTE — Progress Notes (Signed)
Patient ID: Brittany Mcclure, female   DOB: 09-Jul-1940, 75 y.o.   MRN: [id number] HPI: Brittany Mcclure is a 75 year old female with a past medical history of adenomatous colon polyps, colonic diverticulosis, GERD, diabetes, hypertension and hyperlipidemia who is seen in hospital follow-up. She is here today with her daughter. She is known to me from her surveillance colonoscopy which was performed on 04/02/2014. This revealed 7 polyps ranging in size from 3-7 mm in the transverse, descending and cecum. These polyps were removed and found to be tubular adenoma and sessile serrated polyps. A 3 year surveillance colonoscopy was recommended at that time.  She traveled recently with her church to [place] on a tour over the [employer] Day weekend.  Last Friday night 08/15/2015 she developed mild lower abdominal cramping and diarrhea. This was found to be hematochezia and she was taken to a hospital in [place], [place] where she was hospitalized through the weekend. She was having painless hematochezia but did not require blood transfusion. Her hemoglobin, which previously had been normal, fell to 7.7 at the time of her discharge on 08/17/2015. She was seen by an inpatient gastroenterologist and manage conservatively. Reportedly CT scan was done and the bleeding was felt to be "lower". She was told to follow-up once she returned home. She was discharged from the hospital on 08/17/2015.  Discharge labs have been reviewed. Of note at presentation her BUN was 55, creatinine 1.3. Liver enzymes normal. INR normal. Hemoglobin was 9.3 on admission and 7.7 on discharge BUN on 08/17/2015 was 34 and creatinine was 1.0 She had labs checked today her hemoglobin was 8.2. Platelet count normal. MCV 94.1.  She reports that her bowel habits have not yet returned to normal. She feels a "rawness" in her left upper abdomen. She has intermittent indigestion. Prior to her trip she was using Aleve twice daily for arthritis pain. She  has used lansoprazole intermittently for heartburn but has not been using that recently. She had a dark soft stool 2 days ago. This morning she had a nearly formed bowel movement which was dark in color. This was followed by an urgent loose bowel movement also dark in color. She has been on oral iron since discharge from the hospital. No further bowel movement.  Past Medical History  Diagnosis Date  . Exogenous obesity   . Metabolic syndrome   . Hypertension   . Carotid bruit     right  . Chest pain   . Hyperlipidemia   . Seasonal allergies     seasonal  . Anxiety   . Arthritis     with gout  . Cataract   . GERD (gastroesophageal reflux disease)   . Diabetes type 2, uncontrolled ([place])   . Diverticulosis   . Depression   . Colon polyps   . Tubular adenoma of colon     Past Surgical History  Procedure Laterality Date  . Foot surgery      x 2  . Tubal ligation    . Dilation and curettage of uterus    . Colonoscopy      11/90,11/91,1994,2000,2010  . Polypectomy    . Hand surgery    . Uterine polyps removed      x2    Outpatient Prescriptions Prior to Visit  Medication Sig Dispense Refill  . allopurinol (ZYLOPRIM) 100 MG tablet Take 2 tablets (200 mg total) by mouth daily. 180 tablet 3  . ALPRAZolam (XANAX) 0.25 MG tablet TAKE 1 TABLET BY MOUTH TWICE DAILY.  60 tablet 3  . bisoprolol-hydrochlorothiazide (ZIAC) 10-6.25 MG tablet Take 1 tablet by mouth daily. 90 tablet 3  . Cholecalciferol (D3-1000) 1000 UNITS capsule Take 1,000 Units by mouth daily.    . clobetasol (TEMOVATE) 0.05 % external solution Apply 1 application topically as needed.    . Cyanocobalamin (VITAMIN B 12 PO) Take 1 tablet by mouth daily.     . Ferrous Sulfate (IRON) 325 (65 Fe) MG TABS Take by mouth.    . fluocinonide (LIDEX) 0.05 % cream Apply 1 application topically daily as needed (skin irritation).     Marland Kitchen FLUoxetine (PROZAC) 40 MG capsule Take 1 capsule (40 mg total) by mouth daily. Additional refills  from PCP 90 capsule 1  . glucose blood (ACCU-CHEK SMARTVIEW) test strip Use to check blood sugar three times a day and PRN,  Dx code: E11.65 100 each 12  . ketoconazole (NIZORAL) 2 % cream Apply 1 application topically daily as needed (skin irritation).     . Lancets (ACCU-CHEK SOFT TOUCH) lancets Use as instructed 100 each 12  . losartan (COZAAR) 25 MG tablet Take 1 tablet (25 mg total) by mouth daily. 30 tablet 5  . metFORMIN (GLUCOPHAGE) 500 MG tablet TAKE 1 TABLET(500 MG) BY MOUTH DAILY WITH BREAKFAST 90 tablet 3  . Omega-3 Fatty Acids (FISH OIL) 1000 MG CAPS Take 2 capsules by mouth daily.     . lansoprazole (PREVACID) 15 MG capsule Take 15 mg by mouth as needed (heartburn or indigestion).      No facility-administered medications prior to visit.    Allergies  Allergen Reactions  . Crestor [Rosuvastatin Calcium]     Myalgias   . Lipitor [Atorvastatin Calcium]     Myalgias   . Lovastatin     myalgias  . Sulfonamide Derivatives     REACTION: n\T\v  . Sulfur     Made infection worse  . Zetia [Ezetimibe]     Cough, dry    Family History  Problem Relation Age of Onset  . Colon cancer Mother   . Stroke Father   . Hypertension Father   . Rectal cancer Neg Hx   . Stomach cancer Neg Hx   . Heart failure Maternal Grandmother   . Diabetes Maternal Grandmother     ?    Social History  Substance Use Topics  . Smoking status: Former Research scientist (life sciences)  . Smokeless tobacco: Never Used  . Alcohol Use: 0.0 oz/week    0 Standard drinks or equivalent per week     Comment: socially    ROS: As per history of present illness, otherwise negative  BP 124/48 mmHg  Pulse 68  Ht '5\' 1"'  (1.549 m)  Wt 186 lb 8 oz (84.596 kg)  BMI 35.26 kg/m2 Constitutional: Well-developed and well-nourished. No distress. HEENT: Normocephalic and atraumatic. Oropharynx is clear and moist. No oropharyngeal exudate. Conjunctivae are pale.  No scleral icterus. Neck: Neck supple. Trachea midline. Cardiovascular:  Normal rate, regular rhythm and intact distal pulses.  Pulmonary/chest: Effort normal and breath sounds normal. No wheezing, rales or rhonchi. Abdominal: Soft, obese, mild epigastric tenderness without rebound or guarding, moderate left-sided abdominal tenderness to deep palpation, nondistended. Bowel sounds active throughout.  Rectal: Dark soft but formed stool in the rectal vault, no masses, stool is not overtly melenic in color or odor, stool was very trace heme positive Extremities: no clubbing, cyanosis, or edema Neurological: Alert and oriented to person place and time. Skin: Skin is warm and dry. No rashes noted. Psychiatric:  Normal mood and affect. Behavior is normal.  RELEVANT LABS AND IMAGING: CBC    Component Value Date/Time   WBC 11.4* 08/21/2015 1114   RBC 2.69* 08/21/2015 1114   HGB 8.2 Repeated and verified X2.* 08/21/2015 1114   HCT 25.3 Repeated and verified X2.* 08/21/2015 1114   PLT 297.0 08/21/2015 1114   MCV 94.1 08/21/2015 1114   MCHC 32.5 08/21/2015 1114   RDW 15.6* 08/21/2015 1114   LYMPHSABS 2.1 08/21/2015 1114   MONOABS 0.7 08/21/2015 1114   EOSABS 0.4 08/21/2015 1114   BASOSABS 0.1 08/21/2015 1114    CMP     Component Value Date/Time   NA 138 08/21/2015 1114   K 4.6 08/21/2015 1114   CL 103 08/21/2015 1114   CO2 27 08/21/2015 1114   GLUCOSE 102* 08/21/2015 1114   BUN 23 08/21/2015 1114   CREATININE 1.12 08/21/2015 1114   CALCIUM 9.3 08/21/2015 1114   PROT 6.8 08/05/2015 1511   ALBUMIN 4.1 08/05/2015 1511   AST 20 08/05/2015 1511   ALT 17 08/05/2015 1511   ALKPHOS 87 08/05/2015 1511   BILITOT 0.4 08/05/2015 1511   GFRNONAA >60 01/16/2009 1335   GFRAA  01/16/2009 1335    >60        The eGFR has been calculated using the MDRD equation. This calculation has not been validated in all clinical situations. eGFR's persistently <60 mL/min signify possible Chronic Kidney Disease.    ASSESSMENT/PLAN: 75 year old female with a past medical  history of adenomatous colon polyps, colonic diverticulosis, GERD, diabetes, hypertension and hyperlipidemia who is seen in hospital follow-up  1. Recent GI bleed with acute posthemorrhagic anemia -- she was treated for presumed lower GI hemorrhage during hospitalization. We will request records including CT scan performed during this hospital stay. Given her NSAID use, upper abdominal pain, and elevated BUN on presentation I feel it very pertinent to exclude upper GI bleeding source. Her stools are dark, but not overtly melenic. For this reason I'm not recommending that we hospitalize her at present. I will work her in for upper endoscopy early next week to evaluate the upper GI tract. In the interim begin lansoprazole 30 mg twice a day before meals. No NSAIDs. She is given explicit instructions that should she develop overt bleeding again per rectum or black tarry diarrhea, or should she develop increasing weakness, fatigue, fever, chills, presyncopal symptoms that she call 911 and seek care immediately. She voices understanding. Her daughter will be with her this weekend. Repeat CBC on Monday. We discussed the risks, benefits and alternatives to upper endoscopy and she is agreeable to proceed.    UY:EBXI [address], [place], [place] [address]

## 2015-08-21 NOTE — Progress Notes (Signed)
Subjective:    Patient ID: Brittany Mcclure, female    DOB: [birth year], 75 y.o.   MRN: [id number]  HPI 75 year old female who presents to the office today after being admitted to a [facility] on  08/14/2015 and being released on 08/17/2015 for GI bleed. She reports that on 08/14/2015 she went to the restroom and there was a large amount of bright red blood in the toilet. EMS was called and she was taken to the hospital. She spent a day + in an ICU and then was moved to the floor for another day. Kalijah reports that she was told that the GI bleed was though to be from diverticulitis. She does have a history of arthritis and had been taking Advil for the pain.   She reports having a CT done at the hospital but did not have a colonoscopy.   In the office today she reports that she continues to feel fatigued but is starting to feel a little bit better. She has had a few dark stools since returning from the hospital but that she thinks it is from the iron supplement. She had her first " normal colored" bowel movement today, but that it was diarrhea.   She was not placed on any antibiotics   Her daughter who is with her at this visit endorses that her mothers color has improved and she does not appear as pale or lethargic.   She is seeing Dr. Hilarie Fredrickson this afternoon.    Review of Systems  Constitutional: Positive for activity change and fatigue.  Respiratory: Negative.   Cardiovascular: Negative.   Gastrointestinal: Positive for abdominal pain, diarrhea and blood in stool. Negative for nausea, vomiting, constipation, abdominal distention and rectal pain.  Musculoskeletal: Negative.   Neurological: Positive for weakness. Negative for dizziness, light-headedness and headaches.  Psychiatric/Behavioral: Negative.   All other systems reviewed and are negative.  Past Medical History  Diagnosis Date  . Exogenous obesity   . Metabolic syndrome   . Hypertension   . Carotid bruit    right  . Chest pain   . Hyperlipidemia   . Seasonal allergies     seasonal  . Anxiety   . Arthritis     with gout  . Cataract   . GERD (gastroesophageal reflux disease)   . Diabetes type 2, uncontrolled ([place])   . Diverticulosis   . Depression   . Colon polyps   . Tubular adenoma of colon     Social History   Social History  . Marital Status: Widowed    Spouse Name: N/A  . Number of Children: 2  . Years of Education: N/A   Occupational History  . retired    Social History Main Topics  . Smoking status: Former Research scientist (life sciences)  . Smokeless tobacco: Never Used  . Alcohol Use: 0.0 oz/week    0 Standard drinks or equivalent per week     Comment: socially  . Drug Use: No  . Sexual Activity: Not on file   Other Topics Concern  . Not on file   Social History Narrative   Two years of college. She is retired from Scientist, physiological, worked as a Psychologist, occupational at a middle school.    Widowed for two years, married for 52 years   Has two children. Son lives in [place]. Daughter lives locally. 4 grandchildren.     Past Surgical History  Procedure Laterality Date  . Foot surgery      x 2  .  Tubal ligation    . Dilation and curettage of uterus    . Colonoscopy      11/90,11/91,1994,2000,2010  . Polypectomy    . Hand surgery    . Uterine polyps removed      x2    Family History  Problem Relation Age of Onset  . Colon cancer Mother   . Stroke Father   . Hypertension Father   . Rectal cancer Neg Hx   . Stomach cancer Neg Hx   . Heart failure Maternal Grandmother   . Diabetes Maternal Grandmother     ?    Allergies  Allergen Reactions  . Crestor [Rosuvastatin Calcium]     Myalgias   . Lipitor [Atorvastatin Calcium]     Myalgias   . Lovastatin     myalgias  . Sulfonamide Derivatives     REACTION: n\T\v  . Sulfur     Made infection worse  . Zetia [Ezetimibe]     Cough, dry    Current Outpatient Prescriptions on File Prior to Visit  Medication Sig Dispense Refill  .  allopurinol (ZYLOPRIM) 100 MG tablet Take 2 tablets (200 mg total) by mouth daily. 180 tablet 3  . ALPRAZolam (XANAX) 0.25 MG tablet TAKE 1 TABLET BY MOUTH TWICE DAILY. 60 tablet 3  . bisoprolol-hydrochlorothiazide (ZIAC) 10-6.25 MG tablet Take 1 tablet by mouth daily. 90 tablet 3  . Cholecalciferol (D3-1000) 1000 UNITS capsule Take 1,000 Units by mouth daily.    . clobetasol (TEMOVATE) 0.05 % external solution Apply 1 application topically as needed.    . Cyanocobalamin (VITAMIN B 12 PO) Take 1 tablet by mouth daily.     . fluocinonide (LIDEX) 0.05 % cream Apply 1 application topically daily as needed (skin irritation).     Marland Kitchen FLUoxetine (PROZAC) 40 MG capsule Take 1 capsule (40 mg total) by mouth daily. Additional refills from PCP 90 capsule 1  . glucose blood (ACCU-CHEK SMARTVIEW) test strip Use to check blood sugar three times a day and PRN,  Dx code: E11.65 100 each 12  . ketoconazole (NIZORAL) 2 % cream Apply 1 application topically daily as needed (skin irritation).     . Lancets (ACCU-CHEK SOFT TOUCH) lancets Use as instructed 100 each 12  . lansoprazole (PREVACID) 15 MG capsule Take 15 mg by mouth as needed (heartburn or indigestion).     Marland Kitchen losartan (COZAAR) 25 MG tablet Take 1 tablet (25 mg total) by mouth daily. 30 tablet 5  . metFORMIN (GLUCOPHAGE) 500 MG tablet TAKE 1 TABLET(500 MG) BY MOUTH DAILY WITH BREAKFAST 90 tablet 3  . Omega-3 Fatty Acids (FISH OIL) 1000 MG CAPS Take 2 capsules by mouth daily.      No current facility-administered medications on file prior to visit.    BP 120/46 mmHg  Temp(Src) 98.2 F (36.8 C) (Oral)  Ht 5\' 1"  (1.549 m)  Wt 186 lb 14.4 oz (84.777 kg)  BMI 35.33 kg/m2        Objective:   Physical Exam  Constitutional: She is oriented to person, place, and time. She appears well-developed and well-nourished. No distress.  HENT:  Mouth/Throat: Mucous membranes are pale, not dry and not cyanotic.  Cardiovascular: Normal rate, regular rhythm, normal  heart sounds and intact distal pulses.  Exam reveals no gallop and no friction rub.   No murmur heard. Pulmonary/Chest: Effort normal and breath sounds normal. No respiratory distress. She has no wheezes. She has no rales. She exhibits no tenderness.  Abdominal: Soft. Normal  appearance and bowel sounds are normal. She exhibits no distension and no mass. There is tenderness in the left lower quadrant. There is no rebound and no guarding.  Neurological: She is alert and oriented to person, place, and time.  Skin: Skin is warm and dry. No rash noted. She is not diaphoretic. No erythema. There is pallor.  Psychiatric: She has a normal mood and affect. Her behavior is normal. Judgment and thought content normal.  Vitals reviewed.      Assessment & Plan:  1. Gastrointestinal hemorrhage, unspecified gastritis, unspecified gastrointestinal hemorrhage type - Slight tenderness with palpation to left lower quadrant. GI bleed likely from diverticulitis.  - Follow up with Dr. Hilarie Fredrickson as directed, probable colonoscopy.  - No NSAIDs - CBC with Differential/Platelet - Basic metabolic panel - Will update patient with labs.  - Continue with PO iron. - Follow up as needed - go to the ER with continued BRBPR  Dorothyann Peng, NP

## 2015-08-24 ENCOUNTER — Encounter: Payer: Self-pay | Admitting: Internal Medicine

## 2015-08-24 ENCOUNTER — Other Ambulatory Visit (INDEPENDENT_AMBULATORY_CARE_PROVIDER_SITE_OTHER): Payer: Medicare Other

## 2015-08-24 DIAGNOSIS — D62 Acute posthemorrhagic anemia: Secondary | ICD-10-CM

## 2015-08-24 DIAGNOSIS — K922 Gastrointestinal hemorrhage, unspecified: Secondary | ICD-10-CM

## 2015-08-24 LAB — CBC WITH DIFFERENTIAL/PLATELET
BASOS PCT: 0.3 % (ref 0.0–3.0)
Basophils Absolute: 0 10*3/uL (ref 0.0–0.1)
EOS PCT: 2.9 % (ref 0.0–5.0)
Eosinophils Absolute: 0.3 10*3/uL (ref 0.0–0.7)
HEMATOCRIT: 25.7 % — AB (ref 36.0–46.0)
LYMPHS PCT: 25.2 % (ref 12.0–46.0)
Lymphs Abs: 2.8 10*3/uL (ref 0.7–4.0)
MCHC: 32.8 g/dL (ref 30.0–36.0)
MCV: 93.9 fl (ref 78.0–100.0)
Monocytes Absolute: 0.7 10*3/uL (ref 0.1–1.0)
Monocytes Relative: 6 % (ref 3.0–12.0)
Neutro Abs: 7.2 10*3/uL (ref 1.4–7.7)
Neutrophils Relative %: 65.6 % (ref 43.0–77.0)
Platelets: 341 10*3/uL (ref 150.0–400.0)
RBC: 2.73 Mil/uL — ABNORMAL LOW (ref 3.87–5.11)
RDW: 15.5 % (ref 11.5–15.5)
WBC: 11 10*3/uL — AB (ref 4.0–10.5)

## 2015-08-26 ENCOUNTER — Ambulatory Visit (AMBULATORY_SURGERY_CENTER): Payer: Medicare Other | Admitting: Internal Medicine

## 2015-08-26 ENCOUNTER — Other Ambulatory Visit: Payer: Self-pay

## 2015-08-26 ENCOUNTER — Encounter: Payer: Self-pay | Admitting: Internal Medicine

## 2015-08-26 ENCOUNTER — Telehealth: Payer: Self-pay | Admitting: Cardiology

## 2015-08-26 ENCOUNTER — Encounter: Payer: Medicare Other | Admitting: Internal Medicine

## 2015-08-26 VITALS — BP 121/51 | HR 65 | Temp 98.2°F | Resp 14 | Ht 61.0 in | Wt 186.0 lb

## 2015-08-26 DIAGNOSIS — K2950 Unspecified chronic gastritis without bleeding: Secondary | ICD-10-CM | POA: Diagnosis not present

## 2015-08-26 DIAGNOSIS — D649 Anemia, unspecified: Secondary | ICD-10-CM | POA: Diagnosis present

## 2015-08-26 DIAGNOSIS — K922 Gastrointestinal hemorrhage, unspecified: Secondary | ICD-10-CM

## 2015-08-26 LAB — GLUCOSE, CAPILLARY
GLUCOSE-CAPILLARY: 93 mg/dL (ref 65–99)
GLUCOSE-CAPILLARY: 72 mg/dL (ref 65–99)

## 2015-08-26 MED ORDER — SODIUM CHLORIDE 0.9 % IV SOLN: 500.0000 mL | INTRAVENOUS | Status: DC

## 2015-08-26 NOTE — Progress Notes (Signed)
Called to room to assist during endoscopic procedure.  Patient ID and intended procedure confirmed with present staff. Received instructions for my participation in the procedure from the performing physician.  

## 2015-08-26 NOTE — Patient Instructions (Addendum)
YOU HAD AN ENDOSCOPIC PROCEDURE TODAY AT [facility]:   Refer to the procedure report that was given to you for any specific questions about what was found during the examination.  If the procedure report does not answer your questions, please call your gastroenterologist to clarify.  If you requested that your care partner not be given the details of your procedure findings, then the procedure report has been included in a sealed envelope for you to review at your convenience later.  YOU SHOULD EXPECT: Some feelings of bloating in the abdomen. Passage of more gas than usual.  Walking can help get rid of the air that was put into your GI tract during the procedure and reduce the bloating. If you had a lower endoscopy (such as a colonoscopy or flexible sigmoidoscopy) you may notice spotting of blood in your stool or on the toilet paper. If you underwent a bowel prep for your procedure, you may not have a normal bowel movement for a few days.  Please Note:  You might notice some irritation and congestion in your nose or some drainage.  This is from the oxygen used during your procedure.  There is no need for concern and it should clear up in a day or so.  SYMPTOMS TO REPORT IMMEDIATELY:   Following upper endoscopy (EGD)  Vomiting of blood or coffee ground material  New chest pain or pain under the shoulder blades  Painful or persistently difficult swallowing  New shortness of breath  Fever of 100F or higher  Black, tarry-looking stools  For urgent or emergent issues, a gastroenterologist can be reached at any hour by calling [phone].   DIET: Your first meal following the procedure should be a small meal and then it is ok to progress to your normal diet. Heavy or fried foods are harder to digest and may make you feel nauseous or bloated.  Likewise, meals heavy in dairy and vegetables can increase bloating.  Drink plenty of fluids but you should avoid alcoholic beverages for  24 hours.  ACTIVITY:  You should plan to take it easy for the rest of today and you should NOT DRIVE or use heavy machinery until tomorrow (because of the sedation medicines used during the test).    FOLLOW UP: Our staff will call the number listed on your records the next business day following your procedure to check on you and address any questions or concerns that you may have regarding the information given to you following your procedure. If we do not reach you, we will leave a message.  However, if you are feeling well and you are not experiencing any problems, there is no need to return our call.  We will assume that you have returned to your regular daily activities without incident.  If any biopsies were taken you will be contacted by phone or by letter within the next 1-3 weeks.  Please call us at [phone] if you have not heard about the biopsies in 3 weeks.    SIGNATURES/CONFIDENTIALITY: You and/or your care partner have signed paperwork which will be entered into your electronic medical record.  These signatures attest to the fact that that the information above on your After Visit Summary has been reviewed and is understood.  Full responsibility of the confidentiality of this discharge information lies with you and/or your care-partner.  No aspirin, ibuprofen, naproxen, aleve, or other non-steroidal anti-inflammatory drugs. Biopsy results pending. Check hemoglobin in 1 week. Upper endoscopy  in 3 months to check healing.

## 2015-08-26 NOTE — Progress Notes (Signed)
Staff message sent to Rosanne Sack that repeat EGD wasn't scheduled but recall put in.  September schedule isn't available.

## 2015-08-26 NOTE — Op Note (Signed)
[facility] Patient Name: Brittany Mcclure Procedure Date: 08/26/2015 3:02 PM MRN: [id number] Endoscopist: Jerene Bears , MD Age: 75 Referring MD:  Date of Birth: 02-09-1941 Gender: Female Procedure:                Upper GI endoscopy Indications:              Hematochezia, Recent gastrointestinal bleeding,                            Gastrointestinal bleeding of unknown origin, recent                            hospitalization in [place] for presumed                            diverticular bleeding treated conservatively (BUN                            elevated on admission) Medicines:                Monitored Anesthesia Care Procedure:                Pre-Anesthesia Assessment:                           - Prior to the procedure, a History and Physical                            was performed, and patient medications and                            allergies were reviewed. The patient's tolerance of                            previous anesthesia was also reviewed. The risks                            and benefits of the procedure and the sedation                            options and risks were discussed with the patient.                            All questions were answered, and informed consent                            was obtained. Prior Anticoagulants: The patient has                            taken no previous anticoagulant or antiplatelet                            agents. ASA Grade Assessment: III - A patient with  severe systemic disease. After reviewing the risks                            and benefits, the patient was deemed in                            satisfactory condition to undergo the procedure.                           After obtaining informed consent, the endoscope was                            passed under direct vision. Throughout the                            procedure, the patient's blood pressure, pulse, and              oxygen saturations were monitored continuously. The                            Model GIF-HQ190 [id number]) scope was introduced                            through the mouth, and advanced to the second part                            of duodenum. The upper GI endoscopy was                            accomplished without difficulty. The patient                            tolerated the procedure well. Scope In: Scope Out: Findings:                 The examined esophagus was normal.                           Two non-bleeding linear gastric ulcers with no                            stigmata of bleeding were found at the incisura.                            The largest lesion was 7 mm in largest dimension.                           A single non-bleeding localized erosion was found                            on the lesser curvature of the gastric body. There                            were no stigmata of recent bleeding.  Multiple biopsies were obtained with cold forceps                            for histology and Helicobacter pylori testing in                            the gastric body, at the incisura and in the                            gastric antrum.                           Mild inflammation characterized by congestion                            (edema) and erythema was found in the duodenal bulb.                           The second portion of the duodenum was normal. Complications:            No immediate complications. Estimated Blood Loss:     Estimated blood loss was minimal. Impression:               - Normal esophagus.                           - Non-bleeding gastric ulcers with no stigmata of                            bleeding. Potential source of now resolved GI                            bleeding.                           - Gastric erosion without bleeding.                           - Duodenitis.                           - Normal  second portion of the duodenum.                           - Multiple biopsies were obtained in the gastric                            body, at the incisura and in the gastric antrum. Recommendation:           - Patient has a contact number available for                            emergencies. The signs and symptoms of potential                            delayed complications were discussed with the  patient. Return to normal activities tomorrow.                            Written discharge instructions were provided to the                            patient.                           - Resume previous diet.                           - Continue present medications.                           - No aspirin, ibuprofen, naproxen, or other                            non-steroidal anti-inflammatory drugs.                           - Await pathology results.                           - Repeat upper endoscopy in 3 months to check                            healing.                           - Check hemoglobin (CBC) in 1 week. Jerene Bears, MD 08/26/2015 3:50:08 PM This report has been signed electronically.

## 2015-08-26 NOTE — Telephone Encounter (Signed)
New message     [employer] calling need the most recent blood pressure reading and HEMO A1C.   FAX# [phone]   1. Are you calling in reference to your FMLA or disability form? No   2. What is your question in regards to FMLA or disability form? No    3. Do you need copies of your medical records? Yes    4. Are you waiting on a nurse to call you back with results or are you wanting copies of your results? No

## 2015-08-26 NOTE — Progress Notes (Signed)
Report to PACU, RN, vss, BBS= Clear.  

## 2015-08-27 ENCOUNTER — Telehealth: Payer: Self-pay | Admitting: *Deleted

## 2015-08-27 NOTE — Telephone Encounter (Signed)
  Follow up Call-  Call back number 08/26/2015 04/02/2014  Post procedure Call Back phone  # [phone] hm [phone]  Permission to leave phone message Yes Yes     Patient questions:  Do you have a fever, pain , or abdominal swelling? No. Pain Score  0 *  Have you tolerated food without any problems? Yes.    Have you been able to return to your normal activities? Yes.    Do you have any questions about your discharge instructions: Diet   No. Medications  No. Follow up visit  No.  Do you have questions or concerns about your Care? No.  Actions: * If pain score is 4 or above: No action needed, pain <4.

## 2015-09-02 ENCOUNTER — Other Ambulatory Visit (INDEPENDENT_AMBULATORY_CARE_PROVIDER_SITE_OTHER): Payer: Medicare Other

## 2015-09-02 ENCOUNTER — Encounter: Payer: Self-pay | Admitting: Internal Medicine

## 2015-09-02 DIAGNOSIS — K922 Gastrointestinal hemorrhage, unspecified: Secondary | ICD-10-CM | POA: Diagnosis not present

## 2015-09-02 LAB — CBC WITH DIFFERENTIAL/PLATELET
BASOS ABS: 0 10*3/uL (ref 0.0–0.1)
Basophils Relative: 0.5 % (ref 0.0–3.0)
Eosinophils Absolute: 0.3 10*3/uL (ref 0.0–0.7)
Eosinophils Relative: 3 % (ref 0.0–5.0)
HCT: 29.1 % — ABNORMAL LOW (ref 36.0–46.0)
Hemoglobin: 9.5 g/dL — ABNORMAL LOW (ref 12.0–15.0)
LYMPHS PCT: 21.4 % (ref 12.0–46.0)
Lymphs Abs: 2 10*3/uL (ref 0.7–4.0)
MCHC: 32.7 g/dL (ref 30.0–36.0)
MCV: 93.5 fl (ref 78.0–100.0)
MONOS PCT: 6.6 % (ref 3.0–12.0)
Monocytes Absolute: 0.6 10*3/uL (ref 0.1–1.0)
Neutro Abs: 6.5 10*3/uL (ref 1.4–7.7)
Neutrophils Relative %: 68.5 % (ref 43.0–77.0)
PLATELETS: 331 10*3/uL (ref 150.0–400.0)
RBC: 3.11 Mil/uL — ABNORMAL LOW (ref 3.87–5.11)
RDW: 15 % (ref 11.5–15.5)
WBC: 9.5 10*3/uL (ref 4.0–10.5)

## 2015-09-03 ENCOUNTER — Other Ambulatory Visit: Payer: Self-pay

## 2015-09-03 DIAGNOSIS — D649 Anemia, unspecified: Secondary | ICD-10-CM

## 2015-09-21 ENCOUNTER — Ambulatory Visit: Payer: Medicare Other | Admitting: Adult Health

## 2015-09-23 ENCOUNTER — Other Ambulatory Visit: Payer: Self-pay

## 2015-09-23 ENCOUNTER — Encounter: Payer: Self-pay | Admitting: Adult Health

## 2015-09-23 ENCOUNTER — Ambulatory Visit (INDEPENDENT_AMBULATORY_CARE_PROVIDER_SITE_OTHER): Payer: Medicare Other | Admitting: Adult Health

## 2015-09-23 ENCOUNTER — Other Ambulatory Visit (INDEPENDENT_AMBULATORY_CARE_PROVIDER_SITE_OTHER): Payer: Medicare Other

## 2015-09-23 VITALS — BP 140/60 | Temp 97.9°F | Wt 182.0 lb

## 2015-09-23 DIAGNOSIS — D509 Iron deficiency anemia, unspecified: Secondary | ICD-10-CM

## 2015-09-23 DIAGNOSIS — D649 Anemia, unspecified: Secondary | ICD-10-CM | POA: Diagnosis not present

## 2015-09-23 DIAGNOSIS — E1165 Type 2 diabetes mellitus with hyperglycemia: Secondary | ICD-10-CM | POA: Diagnosis not present

## 2015-09-23 DIAGNOSIS — I10 Essential (primary) hypertension: Secondary | ICD-10-CM

## 2015-09-23 DIAGNOSIS — F419 Anxiety disorder, unspecified: Secondary | ICD-10-CM

## 2015-09-23 DIAGNOSIS — IMO0001 Reserved for inherently not codable concepts without codable children: Secondary | ICD-10-CM

## 2015-09-23 LAB — CBC WITH DIFFERENTIAL/PLATELET
BASOS ABS: 0.1 10*3/uL (ref 0.0–0.1)
Basophils Relative: 0.6 % (ref 0.0–3.0)
EOS ABS: 0.2 10*3/uL (ref 0.0–0.7)
Eosinophils Relative: 2.3 % (ref 0.0–5.0)
HEMATOCRIT: 33.8 % — AB (ref 36.0–46.0)
HEMOGLOBIN: 10.9 g/dL — AB (ref 12.0–15.0)
LYMPHS PCT: 23.5 % (ref 12.0–46.0)
Lymphs Abs: 2.3 10*3/uL (ref 0.7–4.0)
MCHC: 32.3 g/dL (ref 30.0–36.0)
MCV: 91.1 fl (ref 78.0–100.0)
Monocytes Absolute: 0.7 10*3/uL (ref 0.1–1.0)
Monocytes Relative: 7.1 % (ref 3.0–12.0)
Neutro Abs: 6.4 10*3/uL (ref 1.4–7.7)
Neutrophils Relative %: 66.5 % (ref 43.0–77.0)
Platelets: 309 10*3/uL (ref 150.0–400.0)
RBC: 3.71 Mil/uL — ABNORMAL LOW (ref 3.87–5.11)
RDW: 14.5 % (ref 11.5–15.5)
WBC: 9.7 10*3/uL (ref 4.0–10.5)

## 2015-09-23 LAB — POCT GLYCOSYLATED HEMOGLOBIN (HGB A1C): Hemoglobin A1C: 5.3

## 2015-09-23 MED ORDER — ALPRAZOLAM 0.25 MG PO TABS: 0.2500 mg | ORAL_TABLET | Freq: Two times a day (BID) | ORAL | Status: DC | PRN

## 2015-09-23 NOTE — Patient Instructions (Addendum)
It was great seeing you again!  Today your A1c was   Keep up the good work.   Follow up with me in September for your physical   Diabetes and Exercise Exercising regularly is important. It is not just about losing weight. It has many health benefits, such as:  Improving your overall fitness, flexibility, and endurance.  Increasing your bone density.  Helping with weight control.  Decreasing your body fat.  Increasing your muscle strength.  Reducing stress and tension.  Improving your overall health. People with diabetes who exercise gain additional benefits because exercise:  Reduces appetite.  Improves the body's use of blood sugar (glucose).  Helps lower or control blood glucose.  Decreases blood pressure.  Helps control blood lipids (such as cholesterol and triglycerides).  Improves the body's use of the hormone insulin by:  Increasing the body's insulin sensitivity.  Reducing the body's insulin needs.  Decreases the risk for heart disease because exercising:  Lowers cholesterol and triglycerides levels.  Increases the levels of good cholesterol (such as high-density lipoproteins [HDL]) in the body.  Lowers blood glucose levels. YOUR ACTIVITY PLAN  Choose an activity that you enjoy, and set realistic goals. To exercise safely, you should begin practicing any new physical activity slowly, and gradually increase the intensity of the exercise over time. Your health care provider or diabetes educator can help create an activity plan that works for you. General recommendations include:  Encouraging children to engage in at least 60 minutes of physical activity each day.  Stretching and performing strength training exercises, such as yoga or weight lifting, at least 2 times per week.  Performing a total of at least 150 minutes of moderate-intensity exercise each week, such as brisk walking or water aerobics.  Exercising at least 3 days per week, making sure you  allow no more than 2 consecutive days to pass without exercising.  Avoiding long periods of inactivity (90 minutes or more). When you have to spend an extended period of time sitting down, take frequent breaks to walk or stretch. RECOMMENDATIONS FOR EXERCISING WITH TYPE 1 OR TYPE 2 DIABETES   Check your blood glucose before exercising. If blood glucose levels are greater than 240 mg/dL, check for urine ketones. Do not exercise if ketones are present.  Avoid injecting insulin into areas of the body that are going to be exercised. For example, avoid injecting insulin into:  The arms when playing tennis.  The legs when jogging.  Keep a record of:  Food intake before and after you exercise.  Expected peak times of insulin action.  Blood glucose levels before and after you exercise.  The type and amount of exercise you have done.  Review your records with your health care provider. Your health care provider will help you to develop guidelines for adjusting food intake and insulin amounts before and after exercising.  If you take insulin or oral hypoglycemic agents, watch for signs and symptoms of hypoglycemia. They include:  Dizziness.  Shaking.  Sweating.  Chills.  Confusion.  Drink plenty of water while you exercise to prevent dehydration or heat stroke. Body water is lost during exercise and must be replaced.  Talk to your health care provider before starting an exercise program to make sure it is safe for you. Remember, almost any type of activity is better than none.   This information is not intended to replace advice given to you by your health care provider. Make sure you discuss any questions you have  with your health care provider.   Document Released: 05/28/2003 Document Revised: 07/22/2014 Document Reviewed: 08/14/2012 Elsevier Interactive Patient Education [employer].

## 2015-09-23 NOTE — Progress Notes (Signed)
Subjective:    Patient ID: Brittany Mcclure, female    DOB: [birth year], 75 y.o.   MRN: [id number]  HPI  75 year old female who presents to the office for three month follow up for diabetes and hypertension. I last saw her in April 2017 at which time her A1c was 6.4. She was eating healthy and exercising by walking 2 miles per day.   She had been started on Losartan 25mg  daily by cardiology.   Today in the office she reports that she is no longer taking her Cozaar. She feels as though she does not need it. Has been monitoring her BP at home and it has been in the 120's/70-80.  She also reports that her blood sugars have been well controlled. She has had some outliers in thr 140's but most of her blood sugars have been in the 90's.   She also needs a refill of her Xanax    Review of Systems  Constitutional: Negative.   Respiratory: Negative.   Cardiovascular: Negative.   Genitourinary: Negative.   Neurological: Negative.   Psychiatric/Behavioral: The patient is nervous/anxious.   All other systems reviewed and are negative.  Past Medical History  Diagnosis Date  . Exogenous obesity   . Metabolic syndrome   . Hypertension   . Carotid bruit     right  . Chest pain   . Hyperlipidemia   . Seasonal allergies     seasonal  . Anxiety   . Arthritis     with gout  . GERD (gastroesophageal reflux disease)   . Diabetes type 2, uncontrolled ([facility])   . Diverticulosis   . Depression   . Colon polyps   . Tubular adenoma of colon   . Cataract     bil cataracts removed  . Allergy   . Anemia   . Osteopenia     Social History   Social History  . Marital Status: Widowed    Spouse Name: N/A  . Number of Children: 2  . Years of Education: N/A   Occupational History  . retired    Social History Main Topics  . Smoking status: Former Research scientist (life sciences)  . Smokeless tobacco: Never Used  . Alcohol Use: 0.0 oz/week    0 Standard drinks or equivalent per week     Comment: socially  . Drug  Use: No  . Sexual Activity: Not on file   Other Topics Concern  . Not on file   Social History Narrative   Two years of college. She is retired from Scientist, physiological, worked as a Psychologist, occupational at a middle school.    Widowed for two years, married for 52 years   Has two children. Son lives in [place]. Daughter lives locally. 4 grandchildren.     Past Surgical History  Procedure Laterality Date  . Foot surgery      x 2  . Tubal ligation    . Dilation and curettage of uterus    . Colonoscopy      11/90,11/91,1994,2000,2010  . Polypectomy    . Hand surgery    . Uterine polyps removed      x2    Family History  Problem Relation Age of Onset  . Colon cancer Mother   . Stroke Father   . Hypertension Father   . Rectal cancer Neg Hx   . Stomach cancer Neg Hx   . Esophageal cancer Neg Hx   . Pancreatic cancer Neg Hx   . Heart failure  Maternal Grandmother   . Diabetes Maternal Grandmother     ?    Allergies  Allergen Reactions  . Crestor [Rosuvastatin Calcium]     Myalgias   . Iodine     Skin rash  . Lipitor [Atorvastatin Calcium]     Myalgias   . Lovastatin     myalgias  . Sulfonamide Derivatives     REACTION: n\T\v  . Sulfur     Made infection worse  . Zetia [Ezetimibe]     Cough, dry    Current Outpatient Prescriptions on File Prior to Visit  Medication Sig Dispense Refill  . allopurinol (ZYLOPRIM) 100 MG tablet Take 2 tablets (200 mg total) by mouth daily. 180 tablet 3  . ALPRAZolam (XANAX) 0.25 MG tablet TAKE 1 TABLET BY MOUTH TWICE DAILY. 60 tablet 3  . bisoprolol-hydrochlorothiazide (ZIAC) 10-6.25 MG tablet Take 1 tablet by mouth daily. 90 tablet 3  . Cholecalciferol (D3-1000) 1000 UNITS capsule Take 1,000 Units by mouth daily.    . clobetasol (TEMOVATE) 0.05 % external solution Apply 1 application topically as needed.    . Cyanocobalamin (VITAMIN B 12 PO) Take 1 tablet by mouth daily.     . Ferrous Sulfate (IRON) 325 (65 Fe) MG TABS Take by mouth.    .  fluocinonide (LIDEX) 0.05 % cream Apply 1 application topically daily as needed (skin irritation).     Marland Kitchen FLUoxetine (PROZAC) 40 MG capsule Take 1 capsule (40 mg total) by mouth daily. Additional refills from PCP 90 capsule 1  . glucose blood (ACCU-CHEK SMARTVIEW) test strip Use to check blood sugar three times a day and PRN,  Dx code: E11.65 100 each 12  . ketoconazole (NIZORAL) 2 % cream Apply 1 application topically daily as needed (skin irritation).     . Lancets (ACCU-CHEK SOFT TOUCH) lancets Use as instructed 100 each 12  . lansoprazole (PREVACID) 30 MG capsule Take 1 capsule (30 mg total) by mouth 2 (two) times daily before a meal. 60 capsule 2  . metFORMIN (GLUCOPHAGE) 500 MG tablet TAKE 1 TABLET(500 MG) BY MOUTH DAILY WITH BREAKFAST 90 tablet 3  . Omega-3 Fatty Acids (FISH OIL) 1000 MG CAPS Take 2 capsules by mouth daily.     Marland Kitchen losartan (COZAAR) 25 MG tablet Take 1 tablet (25 mg total) by mouth daily. (Patient not taking: Reported on 09/23/2015) 30 tablet 5   No current facility-administered medications on file prior to visit.    BP 140/60 mmHg  Temp(Src) 97.9 F (36.6 C) (Oral)  Wt 182 lb (82.555 kg)       Objective:   Physical Exam  Constitutional: She is oriented to person, place, and time. She appears well-developed and well-nourished. No distress.  Cardiovascular: Normal rate, regular rhythm, normal heart sounds and intact distal pulses.  Exam reveals no gallop and no friction rub.   No murmur heard. Pulmonary/Chest: Effort normal and breath sounds normal. No respiratory distress. She has no wheezes. She has no rales. She exhibits no tenderness.  Musculoskeletal: Normal range of motion. She exhibits no edema or tenderness.  Neurological: She is alert and oriented to person, place, and time.  Skin: Skin is warm and dry. No rash noted. She is not diaphoretic. No erythema. No pallor.  Psychiatric: She has a normal mood and affect. Her behavior is normal. Judgment and thought  content normal.  Vitals reviewed.     Assessment & Plan:  1. Uncontrolled type 2 diabetes mellitus without complication, without long-term current use of  insulin ([facility]) - POC HgB A1c - 5.3  - Continue with current medication regimen- no change - Follow up in September for CPE or sooner if needed - Continue to eat healthy and exercise 2. Essential hypertension - per log, her blood pressure is controlled with diet and exercise - continue to monitor and restart medication if needed  3. Anxiety - ALPRAZolam (XANAX) 0.25 MG tablet; Take 1 tablet (0.25 mg total) by mouth 2 (two) times daily as needed for anxiety.  Dispense: 60 tablet; Refill: 0  Dorothyann Peng, NP

## 2015-09-24 ENCOUNTER — Other Ambulatory Visit: Payer: Self-pay

## 2015-09-24 DIAGNOSIS — D509 Iron deficiency anemia, unspecified: Secondary | ICD-10-CM

## 2015-10-22 ENCOUNTER — Telehealth: Payer: Self-pay

## 2015-10-22 ENCOUNTER — Encounter: Payer: Self-pay | Admitting: Internal Medicine

## 2015-10-22 NOTE — Telephone Encounter (Signed)
Pt aware and states she will come the first part of next week. Pt states she is moving into a retirement home and her nerves have really been bothering her and she has been having diarrhea. Discussed with pt that she can take some Imodium OTC for this, pt verbalized understanding.

## 2015-10-22 NOTE — Telephone Encounter (Signed)
-----   Message from Algernon Huxley, RN sent at 09/23/2015  2:52 PM EDT ----- Regarding: Labs Pt needs labs in August, orders in epic.

## 2015-10-28 ENCOUNTER — Other Ambulatory Visit (INDEPENDENT_AMBULATORY_CARE_PROVIDER_SITE_OTHER): Payer: Medicare Other

## 2015-10-28 DIAGNOSIS — D509 Iron deficiency anemia, unspecified: Secondary | ICD-10-CM | POA: Diagnosis not present

## 2015-10-28 LAB — CBC WITH DIFFERENTIAL/PLATELET
BASOS ABS: 0 10*3/uL (ref 0.0–0.1)
Basophils Relative: 0.5 % (ref 0.0–3.0)
EOS ABS: 0.2 10*3/uL (ref 0.0–0.7)
Eosinophils Relative: 1.9 % (ref 0.0–5.0)
HCT: 33 % — ABNORMAL LOW (ref 36.0–46.0)
Hemoglobin: 11 g/dL — ABNORMAL LOW (ref 12.0–15.0)
LYMPHS ABS: 2.1 10*3/uL (ref 0.7–4.0)
Lymphocytes Relative: 25 % (ref 12.0–46.0)
MCHC: 33.3 g/dL (ref 30.0–36.0)
MCV: 88 fl (ref 78.0–100.0)
MONO ABS: 0.6 10*3/uL (ref 0.1–1.0)
Monocytes Relative: 7.2 % (ref 3.0–12.0)
NEUTROS ABS: 5.6 10*3/uL (ref 1.4–7.7)
NEUTROS PCT: 65.4 % (ref 43.0–77.0)
PLATELETS: 278 10*3/uL (ref 150.0–400.0)
RBC: 3.75 Mil/uL — ABNORMAL LOW (ref 3.87–5.11)
RDW: 14.8 % (ref 11.5–15.5)
WBC: 8.5 10*3/uL (ref 4.0–10.5)

## 2015-10-28 LAB — IBC PANEL
IRON: 27 ug/dL — AB (ref 42–145)
SATURATION RATIOS: 7.3 % — AB (ref 20.0–50.0)
Transferrin: 263 mg/dL (ref 212.0–360.0)

## 2015-10-28 LAB — FERRITIN: Ferritin: 38.4 ng/mL (ref 10.0–291.0)

## 2015-11-02 ENCOUNTER — Other Ambulatory Visit: Payer: Self-pay

## 2015-11-02 DIAGNOSIS — D509 Iron deficiency anemia, unspecified: Secondary | ICD-10-CM

## 2015-11-25 ENCOUNTER — Other Ambulatory Visit: Payer: Self-pay | Admitting: Adult Health

## 2015-11-25 DIAGNOSIS — F419 Anxiety disorder, unspecified: Secondary | ICD-10-CM

## 2015-11-25 NOTE — Telephone Encounter (Signed)
Ok to refill for one month  

## 2015-11-25 NOTE — Telephone Encounter (Signed)
Rx called in as directed.   

## 2015-11-30 ENCOUNTER — Other Ambulatory Visit: Payer: Self-pay

## 2015-12-15 ENCOUNTER — Encounter: Payer: Self-pay | Admitting: Adult Health

## 2015-12-15 ENCOUNTER — Telehealth: Payer: Self-pay | Admitting: Adult Health

## 2015-12-15 NOTE — Telephone Encounter (Signed)
Pharmacy has been changed to preferred one. Thanks!

## 2015-12-15 NOTE — Telephone Encounter (Signed)
Patient states she wants to change her pharmacy in her chart to:  [facility] [address] [place], [place] [address] [phone]

## 2016-01-04 ENCOUNTER — Other Ambulatory Visit: Payer: Self-pay

## 2016-01-04 MED ORDER — METFORMIN HCL 500 MG PO TABS: ORAL_TABLET | ORAL | 3 refills | Status: DC

## 2016-01-04 NOTE — Telephone Encounter (Signed)
Rx Metformin sent to [facility], to continue current regimen according to last OV note.

## 2016-01-18 ENCOUNTER — Other Ambulatory Visit: Payer: Self-pay

## 2016-01-18 MED ORDER — BISOPROLOL-HYDROCHLOROTHIAZIDE 10-6.25 MG PO TABS: 1.0000 | ORAL_TABLET | Freq: Every day | ORAL | 3 refills | Status: DC

## 2016-02-04 ENCOUNTER — Other Ambulatory Visit: Payer: Self-pay

## 2016-02-04 ENCOUNTER — Telehealth: Payer: Self-pay | Admitting: Adult Health

## 2016-02-04 MED ORDER — FLUOXETINE HCL 40 MG PO CAPS: 40.0000 mg | ORAL_CAPSULE | Freq: Every day | ORAL | 1 refills | Status: DC

## 2016-02-04 NOTE — Telephone Encounter (Signed)
Ok to refill this medication? 

## 2016-02-04 NOTE — Telephone Encounter (Signed)
Patient came in requesting RX refill on FLUoxetine (PROZAC) 40 MG capsule .Patient is out of medication and needs as soon as possible.m  Pharmacy: [facility] [place]: [phone]

## 2016-02-04 NOTE — Telephone Encounter (Signed)
Ok to refill for 6 months 

## 2016-02-04 NOTE — Telephone Encounter (Signed)
Rx has been sent in. 

## 2016-02-16 ENCOUNTER — Other Ambulatory Visit (INDEPENDENT_AMBULATORY_CARE_PROVIDER_SITE_OTHER): Payer: Medicare Other

## 2016-02-16 DIAGNOSIS — D509 Iron deficiency anemia, unspecified: Secondary | ICD-10-CM | POA: Diagnosis not present

## 2016-02-16 LAB — IBC PANEL
Iron: 64 ug/dL (ref 42–145)
Saturation Ratios: 16.4 % — ABNORMAL LOW (ref 20.0–50.0)
Transferrin: 278 mg/dL (ref 212.0–360.0)

## 2016-02-16 LAB — FERRITIN: Ferritin: 30.7 ng/mL (ref 10.0–291.0)

## 2016-02-17 ENCOUNTER — Other Ambulatory Visit: Payer: Self-pay

## 2016-02-17 DIAGNOSIS — D508 Other iron deficiency anemias: Secondary | ICD-10-CM

## 2016-02-17 LAB — CBC WITH DIFFERENTIAL/PLATELET
BASOS PCT: 0.9 % (ref 0.0–3.0)
Basophils Absolute: 0.1 10*3/uL (ref 0.0–0.1)
EOS PCT: 2.2 % (ref 0.0–5.0)
Eosinophils Absolute: 0.2 10*3/uL (ref 0.0–0.7)
HEMATOCRIT: 35.2 % — AB (ref 36.0–46.0)
Hemoglobin: 11.6 g/dL — ABNORMAL LOW (ref 12.0–15.0)
LYMPHS PCT: 25.8 % (ref 12.0–46.0)
Lymphs Abs: 2.4 10*3/uL (ref 0.7–4.0)
MCHC: 33.1 g/dL (ref 30.0–36.0)
MCV: 89.6 fl (ref 78.0–100.0)
MONOS PCT: 4.9 % (ref 3.0–12.0)
Monocytes Absolute: 0.5 10*3/uL (ref 0.1–1.0)
NEUTROS ABS: 6.2 10*3/uL (ref 1.4–7.7)
Neutrophils Relative %: 66.2 % (ref 43.0–77.0)
PLATELETS: 249 10*3/uL (ref 150.0–400.0)
RBC: 3.93 Mil/uL (ref 3.87–5.11)
RDW: 18.1 % — AB (ref 11.5–15.5)
WBC: 9.4 10*3/uL (ref 4.0–10.5)

## 2016-02-22 ENCOUNTER — Other Ambulatory Visit (INDEPENDENT_AMBULATORY_CARE_PROVIDER_SITE_OTHER): Payer: Medicare Other

## 2016-02-22 DIAGNOSIS — Z0000 Encounter for general adult medical examination without abnormal findings: Secondary | ICD-10-CM | POA: Diagnosis not present

## 2016-02-22 LAB — POC URINALSYSI DIPSTICK (AUTOMATED)
Bilirubin, UA: NEGATIVE
Blood, UA: NEGATIVE
GLUCOSE UA: NEGATIVE
Ketones, UA: NEGATIVE
NITRITE UA: NEGATIVE
Protein, UA: NEGATIVE
Spec Grav, UA: 1.015
UROBILINOGEN UA: 0.2
pH, UA: 7

## 2016-02-22 LAB — LIPID PANEL
CHOLESTEROL: 207 mg/dL — AB (ref 0–200)
HDL: 51.8 mg/dL (ref >39.00)
LDL Cholesterol: 126 mg/dL — ABNORMAL HIGH (ref 0–99)
NonHDL: 155.64
TRIGLYCERIDES: 146 mg/dL (ref 0.0–149.0)
Total CHOL/HDL Ratio: 4
VLDL: 29.2 mg/dL (ref 0.0–40.0)

## 2016-02-22 LAB — CBC WITH DIFFERENTIAL/PLATELET
BASOS PCT: 0.6 % (ref 0.0–3.0)
Basophils Absolute: 0 10*3/uL (ref 0.0–0.1)
EOS PCT: 2 % (ref 0.0–5.0)
Eosinophils Absolute: 0.2 10*3/uL (ref 0.0–0.7)
HEMATOCRIT: 36.3 % (ref 36.0–46.0)
HEMOGLOBIN: 11.9 g/dL — AB (ref 12.0–15.0)
LYMPHS PCT: 20.9 % (ref 12.0–46.0)
Lymphs Abs: 1.7 10*3/uL (ref 0.7–4.0)
MCHC: 32.7 g/dL (ref 30.0–36.0)
MCV: 90.6 fl (ref 78.0–100.0)
MONO ABS: 0.4 10*3/uL (ref 0.1–1.0)
Monocytes Relative: 5.6 % (ref 3.0–12.0)
Neutro Abs: 5.6 10*3/uL (ref 1.4–7.7)
Neutrophils Relative %: 70.9 % (ref 43.0–77.0)
Platelets: 262 10*3/uL (ref 150.0–400.0)
RBC: 4 Mil/uL (ref 3.87–5.11)
RDW: 18.1 % — AB (ref 11.5–15.5)
WBC: 7.9 10*3/uL (ref 4.0–10.5)

## 2016-02-22 LAB — HEPATIC FUNCTION PANEL
ALT: 12 U/L (ref 0–35)
AST: 17 U/L (ref 0–37)
Albumin: 3.9 g/dL (ref 3.5–5.2)
Alkaline Phosphatase: 79 U/L (ref 39–117)
Bilirubin, Direct: 0.1 mg/dL (ref 0.0–0.3)
TOTAL PROTEIN: 6.7 g/dL (ref 6.0–8.3)
Total Bilirubin: 0.6 mg/dL (ref 0.2–1.2)

## 2016-02-22 LAB — BASIC METABOLIC PANEL
BUN: 21 mg/dL (ref 6–23)
CHLORIDE: 102 meq/L (ref 96–112)
CO2: 30 mEq/L (ref 19–32)
Calcium: 9.3 mg/dL (ref 8.4–10.5)
Creatinine, Ser: 1.15 mg/dL (ref 0.40–1.20)
GFR: 48.56 mL/min — ABNORMAL LOW (ref >60.00)
Glucose, Bld: 100 mg/dL — ABNORMAL HIGH (ref 70–99)
POTASSIUM: 4.8 meq/L (ref 3.5–5.1)
Sodium: 137 mEq/L (ref 135–145)

## 2016-02-22 LAB — TSH: TSH: 1.64 u[IU]/mL (ref 0.35–4.50)

## 2016-02-22 LAB — MICROALBUMIN / CREATININE URINE RATIO
Creatinine,U: 82.4 mg/dL
MICROALB UR: 0.8 mg/dL (ref 0.0–1.9)
MICROALB/CREAT RATIO: 1 mg/g (ref 0.0–30.0)

## 2016-02-22 LAB — HEMOGLOBIN A1C: HEMOGLOBIN A1C: 6 % (ref 4.6–6.5)

## 2016-03-03 ENCOUNTER — Encounter: Payer: Self-pay | Admitting: Adult Health

## 2016-03-03 ENCOUNTER — Ambulatory Visit (INDEPENDENT_AMBULATORY_CARE_PROVIDER_SITE_OTHER): Payer: Medicare Other | Admitting: Adult Health

## 2016-03-03 VITALS — BP 148/70 | Temp 97.9°F | Ht 61.0 in | Wt 182.8 lb

## 2016-03-03 DIAGNOSIS — E785 Hyperlipidemia, unspecified: Secondary | ICD-10-CM

## 2016-03-03 DIAGNOSIS — Z0000 Encounter for general adult medical examination without abnormal findings: Secondary | ICD-10-CM

## 2016-03-03 DIAGNOSIS — IMO0001 Reserved for inherently not codable concepts without codable children: Secondary | ICD-10-CM

## 2016-03-03 DIAGNOSIS — I10 Essential (primary) hypertension: Secondary | ICD-10-CM

## 2016-03-03 DIAGNOSIS — E1165 Type 2 diabetes mellitus with hyperglycemia: Secondary | ICD-10-CM | POA: Diagnosis not present

## 2016-03-03 NOTE — Patient Instructions (Signed)
It was great seeing you today!  Your labs look good, keep up the good work the diabetes management   Follow up with me in 3 months   Have a happy holiday season

## 2016-03-03 NOTE — Progress Notes (Signed)
Subjective:    Patient ID: Brittany Mcclure, female    DOB: [birth year], 75 y.o.   MRN: [id number]  HPI  Patient presents for yearly preventative medicine examination. She is a pleasant 75 year old female who  has a past medical history of Allergy; Anemia; Anxiety; Arthritis; Carotid bruit; Cataract; Chest pain; Colon polyps; Depression; Diabetes type 2, uncontrolled ([place]); Diverticulosis; Exogenous obesity; GERD (gastroesophageal reflux disease); Hyperlipidemia; Hypertension; Metabolic syndrome; Osteopenia; Seasonal allergies; and Tubular adenoma of colon.  All immunizations and health maintenance protocols were reviewed with the patient and needed orders were placed.  Medication reconciliation,  past medical history, social history, problem list and allergies were reviewed in detail with the patient  Goals were established with regard to weight loss, exercise, and  diet in compliance with medications. She is doing water aerobics three times per week   End of life planning was discussed. She has an advanced directive and living will.   She has an upcoming mammogram. She does self breast exams at home. Has not noticed any changes. She is up to date on vision and dental screens  Review of Systems  Constitutional: Negative.   HENT: Negative.   Eyes: Negative.   Respiratory: Negative.   Cardiovascular: Negative.   Gastrointestinal: Positive for abdominal pain (chronic from diverticulitis ).  Endocrine: Negative.   Genitourinary: Negative.   Musculoskeletal: Negative.   Skin: Negative.   Allergic/Immunologic: Negative.   Neurological: Negative.   Hematological: Negative.   Psychiatric/Behavioral: Negative.    Past Medical History:  Diagnosis Date  . Allergy   . Anemia   . Anxiety   . Arthritis    with gout  . Carotid bruit    right  . Cataract    bil cataracts removed  . Chest pain   . Colon polyps   . Depression   . Diabetes type 2, uncontrolled ([place])   . Diverticulosis     . Exogenous obesity   . GERD (gastroesophageal reflux disease)   . Hyperlipidemia   . Hypertension   . Metabolic syndrome   . Osteopenia   . Seasonal allergies    seasonal  . Tubular adenoma of colon     Social History   Social History  . Marital status: Widowed    Spouse name: N/A  . Number of children: 2  . Years of education: N/A   Occupational History  . retired Retired   Social History Main Topics  . Smoking status: Former Research scientist (life sciences)  . Smokeless tobacco: Never Used  . Alcohol use 0.0 oz/week     Comment: socially  . Drug use: No  . Sexual activity: Not on file   Other Topics Concern  . Not on file   Social History Narrative   Two years of college. She is retired from Scientist, physiological, worked as a Psychologist, occupational at a middle school.    Widowed for two years, married for 52 years   Has two children. Son lives in [place]. Daughter lives locally. 4 grandchildren.     Past Surgical History:  Procedure Laterality Date  . COLONOSCOPY     11/90,11/91,1994,2000,2010  . DILATION AND CURETTAGE OF UTERUS    . FOOT SURGERY     x 2  . HAND SURGERY    . POLYPECTOMY    . TUBAL LIGATION    . uterine polyps removed     x2    Family History  Problem Relation Age of Onset  . Colon cancer Mother   .  Stroke Father   . Hypertension Father   . Rectal cancer Neg Hx   . Stomach cancer Neg Hx   . Esophageal cancer Neg Hx   . Pancreatic cancer Neg Hx   . Heart failure Maternal Grandmother   . Diabetes Maternal Grandmother     ?    Allergies  Allergen Reactions  . Crestor [Rosuvastatin Calcium]     Myalgias   . Iodine     Skin rash  . Lipitor [Atorvastatin Calcium]     Myalgias   . Lovastatin     myalgias  . Sulfonamide Derivatives     REACTION: n\T\v  . Sulfur     Made infection worse  . Zetia [Ezetimibe]     Cough, dry    Current Outpatient Prescriptions on File Prior to Visit  Medication Sig Dispense Refill  . allopurinol (ZYLOPRIM) 100 MG tablet Take 2 tablets  (200 mg total) by mouth daily. 180 tablet 3  . ALPRAZolam (XANAX) 0.25 MG tablet TAKE 1 TABLET BY MOUTH TWICE DAILY AS NEEDED FOR ANXIETY. 60 tablet 0  . bisoprolol-hydrochlorothiazide (ZIAC) 10-6.25 MG tablet Take 1 tablet by mouth daily. 90 tablet 3  . Cholecalciferol (D3-1000) 1000 UNITS capsule Take 1,000 Units by mouth daily.    . clobetasol (TEMOVATE) 0.05 % external solution Apply 1 application topically as needed.    . Cyanocobalamin (VITAMIN B 12 PO) Take 1 tablet by mouth daily.     . Ferrous Sulfate (IRON) 325 (65 Fe) MG TABS Take by mouth.    . fluocinonide (LIDEX) 0.05 % cream Apply 1 application topically daily as needed (skin irritation).     Marland Kitchen FLUoxetine (PROZAC) 40 MG capsule Take 1 capsule (40 mg total) by mouth daily. Additional refills from PCP 90 capsule 1  . glucose blood (ACCU-CHEK SMARTVIEW) test strip Use to check blood sugar three times a day and PRN,  Dx code: E11.65 100 each 12  . ketoconazole (NIZORAL) 2 % cream Apply 1 application topically daily as needed (skin irritation).     . Lancets (ACCU-CHEK SOFT TOUCH) lancets Use as instructed 100 each 12  . lansoprazole (PREVACID) 30 MG capsule Take 1 capsule (30 mg total) by mouth 2 (two) times daily before a meal. 60 capsule 2  . losartan (COZAAR) 25 MG tablet Take 1 tablet (25 mg total) by mouth daily. 30 tablet 5  . metFORMIN (GLUCOPHAGE) 500 MG tablet TAKE 1 TABLET(500 MG) BY MOUTH DAILY WITH BREAKFAST 90 tablet 3  . Omega-3 Fatty Acids (FISH OIL) 1000 MG CAPS Take 2 capsules by mouth daily.      No current facility-administered medications on file prior to visit.     BP (!) 148/70   Temp 97.9 F (36.6 C) (Oral)   Ht 5\' 1"  (1.549 m)   Wt 182 lb 12.8 oz (82.9 kg)   BMI 34.54 kg/m       Objective:   Physical Exam  Constitutional: She is oriented to person, place, and time. She appears well-developed and well-nourished. No distress.  HENT:  Head: Normocephalic and atraumatic.  Right Ear: External ear  normal.  Left Ear: External ear normal.  Nose: Nose normal.  Mouth/Throat: No oropharyngeal exudate.  Eyes: Conjunctivae and EOM are normal. Pupils are equal, round, and reactive to light. Right eye exhibits no discharge. Left eye exhibits no discharge. No scleral icterus.  Neck: Trachea normal and normal range of motion. Neck supple. No JVD present. Carotid bruit is not present. No tracheal deviation  present. No thyroid mass and no thyromegaly present.  Cardiovascular: Normal rate, regular rhythm, normal heart sounds and intact distal pulses.  Exam reveals no gallop and no friction rub.   No murmur heard. Pulmonary/Chest: Effort normal and breath sounds normal. No stridor. No respiratory distress. She has no wheezes. She has no rales. She exhibits no tenderness.  Abdominal: Soft. Bowel sounds are normal. She exhibits no distension and no mass. There is no tenderness. There is no rebound and no guarding.  Genitourinary:  Genitourinary Comments: Refused breast exam   Musculoskeletal: Normal range of motion. She exhibits no edema, tenderness or deformity.  Lymphadenopathy:    She has no cervical adenopathy.  Neurological: She is alert and oriented to person, place, and time. She has normal reflexes. She displays normal reflexes. No cranial nerve deficit. She exhibits normal muscle tone. Coordination normal.  Skin: Skin is warm and dry. No rash noted. She is not diaphoretic. No erythema. No pallor.  Psychiatric: She has a normal mood and affect. Her behavior is normal. Judgment and thought content normal.  Nursing note and vitals reviewed.      Assessment & Plan:  1. Routine general medical examination at a health care facility - Reviewed labs with patient.  - Encouraged diet and exercise  2. Essential hypertension - Near goal - Will continue to monitor  - No change in medication    3. Uncontrolled type 2 diabetes mellitus without complication, without long-term current use of insulin  (HCC) - A1c 6.0 this is up from 5.3 five months ago.  - Will follow up with in 3 months for repeat - No change in medication at this time.    4. Dyslipidemia - Total cholesterol and LDL have increased.  - Triglycerides decreased Lab Results  Component Value Date   CHOL 207 (H) 02/22/2016   HDL 51.80 02/22/2016   LDLCALC 126 (H) 02/22/2016   LDLDIRECT 136.7 02/15/2013   TRIG 146.0 02/22/2016   CHOLHDL 4 02/22/2016  - She cannot take statin  - Advised low fat diet and exercise  Dorothyann Peng, NP

## 2016-03-30 ENCOUNTER — Other Ambulatory Visit: Payer: Self-pay | Admitting: Adult Health

## 2016-03-30 DIAGNOSIS — Z1231 Encounter for screening mammogram for malignant neoplasm of breast: Secondary | ICD-10-CM

## 2016-04-11 ENCOUNTER — Ambulatory Visit
Admission: RE | Admit: 2016-04-11 | Discharge: 2016-04-11 | Disposition: A | Discharge status: Home / Self Care | Payer: Medicare Other | Source: Ambulatory Visit | Attending: Adult Health | Admitting: Adult Health

## 2016-04-11 DIAGNOSIS — Z1231 Encounter for screening mammogram for malignant neoplasm of breast: Secondary | ICD-10-CM

## 2016-05-03 ENCOUNTER — Telehealth: Payer: Self-pay | Admitting: Adult Health

## 2016-05-03 NOTE — Telephone Encounter (Signed)
Last rx given on 11/25/2015 for #60

## 2016-05-03 NOTE — Telephone Encounter (Signed)
I am sorry, but I cannot give 90 pills.   Ok to refill for 60

## 2016-05-03 NOTE — Telephone Encounter (Signed)
° ° ° °  Pt request refill of the following:   ALPRAZolam (XANAX) 0.25 MG tablet   Pt ask if she could get 90 if not she will be ok with 60  Phamacy: [facility]

## 2016-05-04 ENCOUNTER — Other Ambulatory Visit: Payer: Self-pay

## 2016-05-04 DIAGNOSIS — F419 Anxiety disorder, unspecified: Secondary | ICD-10-CM

## 2016-05-04 MED ORDER — ALPRAZOLAM 0.25 MG PO TABS: 0.2500 mg | ORAL_TABLET | Freq: Two times a day (BID) | ORAL | 0 refills | Status: DC | PRN

## 2016-05-04 NOTE — Telephone Encounter (Signed)
Rx has been called in for #60 tabs as directed. Thanks!

## 2016-05-27 ENCOUNTER — Other Ambulatory Visit (INDEPENDENT_AMBULATORY_CARE_PROVIDER_SITE_OTHER): Payer: Medicare Other

## 2016-05-27 DIAGNOSIS — D508 Other iron deficiency anemias: Secondary | ICD-10-CM

## 2016-05-27 LAB — IBC PANEL
Iron: 68 ug/dL (ref 42–145)
SATURATION RATIOS: 16.4 % — AB (ref 20.0–50.0)
Transferrin: 296 mg/dL (ref 212.0–360.0)

## 2016-05-27 LAB — CBC WITH DIFFERENTIAL/PLATELET
BASOS ABS: 0.1 10*3/uL (ref 0.0–0.1)
Basophils Relative: 0.7 % (ref 0.0–3.0)
Eosinophils Absolute: 0.2 10*3/uL (ref 0.0–0.7)
Eosinophils Relative: 2.2 % (ref 0.0–5.0)
HCT: 38.3 % (ref 36.0–46.0)
Hemoglobin: 12.7 g/dL (ref 12.0–15.0)
LYMPHS ABS: 2 10*3/uL (ref 0.7–4.0)
Lymphocytes Relative: 20.8 % (ref 12.0–46.0)
MCHC: 33.1 g/dL (ref 30.0–36.0)
MCV: 93.4 fl (ref 78.0–100.0)
MONOS PCT: 7.4 % (ref 3.0–12.0)
Monocytes Absolute: 0.7 10*3/uL (ref 0.1–1.0)
NEUTROS ABS: 6.6 10*3/uL (ref 1.4–7.7)
NEUTROS PCT: 68.9 % (ref 43.0–77.0)
PLATELETS: 271 10*3/uL (ref 150.0–400.0)
RBC: 4.1 Mil/uL (ref 3.87–5.11)
RDW: 14.7 % (ref 11.5–15.5)
WBC: 9.6 10*3/uL (ref 4.0–10.5)

## 2016-05-27 LAB — FERRITIN: Ferritin: 32.5 ng/mL (ref 10.0–291.0)

## 2016-05-30 ENCOUNTER — Other Ambulatory Visit: Payer: Self-pay

## 2016-05-30 DIAGNOSIS — D509 Iron deficiency anemia, unspecified: Secondary | ICD-10-CM

## 2016-06-01 ENCOUNTER — Encounter: Payer: Self-pay | Admitting: Adult Health

## 2016-06-01 ENCOUNTER — Ambulatory Visit (INDEPENDENT_AMBULATORY_CARE_PROVIDER_SITE_OTHER): Payer: Medicare Other | Admitting: Adult Health

## 2016-06-01 VITALS — BP 148/78 | Temp 97.9°F | Ht 61.0 in | Wt 186.8 lb

## 2016-06-01 DIAGNOSIS — IMO0001 Reserved for inherently not codable concepts without codable children: Secondary | ICD-10-CM

## 2016-06-01 DIAGNOSIS — E1165 Type 2 diabetes mellitus with hyperglycemia: Secondary | ICD-10-CM

## 2016-06-01 LAB — POCT GLYCOSYLATED HEMOGLOBIN (HGB A1C): HEMOGLOBIN A1C: 6.1

## 2016-06-01 NOTE — Progress Notes (Signed)
Subjective:    Patient ID: Brittany Mcclure, female    DOB: 21-Jul-1940, 76 y.o.   MRN: [id number]  HPI  76 year old female who  has a past medical history of Allergy; Anemia; Anxiety; Arthritis; Carotid bruit; Cataract; Chest pain; Colon polyps; Depression; Diabetes type 2, uncontrolled ([place]); Diverticulosis; Exogenous obesity; GERD (gastroesophageal reflux disease); Hyperlipidemia; Hypertension; Metabolic syndrome; Osteopenia; Seasonal allergies; and Tubular adenoma of colon.  She presents to the office today for follow up regarding diabetes. Her last A1c was 6.0, up from 5.3. She is currently taking Metformin 500mg  daily. She has gained about 4 pounds since December. She is not exercising and much as she would like to exercise and her diet has been increased with carbs.   Wt Readings from Last 3 Encounters:  06/01/16 186 lb 12.8 oz (84.7 kg)  03/03/16 182 lb 12.8 oz (82.9 kg)  09/23/15 182 lb (82.6 kg)   Review of Systems  Constitutional: Negative.   Respiratory: Negative.   Cardiovascular: Negative.   Neurological: Negative.   All other systems reviewed and are negative.  Past Medical History:  Diagnosis Date  . Allergy   . Anemia   . Anxiety   . Arthritis    with gout  . Carotid bruit    right  . Cataract    bil cataracts removed  . Chest pain   . Colon polyps   . Depression   . Diabetes type 2, uncontrolled ([place])   . Diverticulosis   . Exogenous obesity   . GERD (gastroesophageal reflux disease)   . Hyperlipidemia   . Hypertension   . Metabolic syndrome   . Osteopenia   . Seasonal allergies    seasonal  . Tubular adenoma of colon     Social History   Social History  . Marital status: Widowed    Spouse name: N/A  . Number of children: 2  . Years of education: N/A   Occupational History  . retired Retired   Social History Main Topics  . Smoking status: Former Research scientist (life sciences)  . Smokeless tobacco: Never Used  . Alcohol use 0.0 oz/week     Comment: socially  .  Drug use: No  . Sexual activity: Not on file   Other Topics Concern  . Not on file   Social History Narrative   Two years of college. She is retired from Scientist, physiological, worked as a Psychologist, occupational at a middle school.    Widowed for two years, married for 52 years   Has two children. Son lives in [place]. Daughter lives locally. 4 grandchildren.     Past Surgical History:  Procedure Laterality Date  . COLONOSCOPY     11/90,11/91,1994,2000,2010  . DILATION AND CURETTAGE OF UTERUS    . FOOT SURGERY     x 2  . HAND SURGERY    . POLYPECTOMY    . TUBAL LIGATION    . uterine polyps removed     x2    Family History  Problem Relation Age of Onset  . Colon cancer Mother   . Stroke Father   . Hypertension Father   . Rectal cancer Neg Hx   . Stomach cancer Neg Hx   . Esophageal cancer Neg Hx   . Pancreatic cancer Neg Hx   . Heart failure Maternal Grandmother   . Diabetes Maternal Grandmother     ?    Allergies  Allergen Reactions  . Crestor [Rosuvastatin Calcium]     Myalgias   .  Iodine     Skin rash  . Lipitor [Atorvastatin Calcium]     Myalgias   . Lovastatin     myalgias  . Sulfonamide Derivatives     REACTION: n\T\v  . Sulfur     Made infection worse  . Zetia [Ezetimibe]     Cough, dry    Current Outpatient Prescriptions on File Prior to Visit  Medication Sig Dispense Refill  . allopurinol (ZYLOPRIM) 100 MG tablet Take 2 tablets (200 mg total) by mouth daily. 180 tablet 3  . ALPRAZolam (XANAX) 0.25 MG tablet Take 1 tablet (0.25 mg total) by mouth 2 (two) times daily as needed. for anxiety 60 tablet 0  . bisoprolol-hydrochlorothiazide (ZIAC) 10-6.25 MG tablet Take 1 tablet by mouth daily. 90 tablet 3  . Cholecalciferol (D3-1000) 1000 UNITS capsule Take 1,000 Units by mouth daily.    . clobetasol (TEMOVATE) 0.05 % external solution Apply 1 application topically as needed.    . Cyanocobalamin (VITAMIN B 12 PO) Take 1 tablet by mouth daily.     . Ferrous Sulfate (IRON) 325  (65 Fe) MG TABS Take by mouth.    . fluocinonide (LIDEX) 0.05 % cream Apply 1 application topically daily as needed (skin irritation).     Marland Kitchen FLUoxetine (PROZAC) 40 MG capsule Take 1 capsule (40 mg total) by mouth daily. Additional refills from PCP 90 capsule 1  . glucose blood (ACCU-CHEK SMARTVIEW) test strip Use to check blood sugar three times a day and PRN,  Dx code: E11.65 100 each 12  . ketoconazole (NIZORAL) 2 % cream Apply 1 application topically daily as needed (skin irritation).     . Lancets (ACCU-CHEK SOFT TOUCH) lancets Use as instructed 100 each 12  . lansoprazole (PREVACID) 30 MG capsule Take 1 capsule (30 mg total) by mouth 2 (two) times daily before a meal. 60 capsule 2  . losartan (COZAAR) 25 MG tablet Take 1 tablet (25 mg total) by mouth daily. 30 tablet 5  . metFORMIN (GLUCOPHAGE) 500 MG tablet TAKE 1 TABLET(500 MG) BY MOUTH DAILY WITH BREAKFAST 90 tablet 3  . Omega-3 Fatty Acids (FISH OIL) 1000 MG CAPS Take 2 capsules by mouth daily.      No current facility-administered medications on file prior to visit.     BP (!) 148/78 (BP Location: Left Arm, Patient Position: Sitting, Cuff Size: Normal)   Temp 97.9 F (36.6 C) (Oral)   Ht 5\' 1"  (1.549 m)   Wt 186 lb 12.8 oz (84.7 kg)   BMI 35.30 kg/m       Objective:   Physical Exam  Constitutional: She is oriented to person, place, and time. She appears well-developed and well-nourished. No distress.  Overweight     Cardiovascular: Normal rate, regular rhythm, normal heart sounds and intact distal pulses.  Exam reveals no gallop and no friction rub.   No murmur heard. Pulmonary/Chest: Effort normal and breath sounds normal. No respiratory distress. She has no wheezes. She has no rales. She exhibits no tenderness.  Neurological: She is alert and oriented to person, place, and time.  Skin: Skin is warm and dry. No rash noted. She is not diaphoretic. No erythema. No pallor.  Psychiatric: She has a normal mood and affect. Her  behavior is normal. Judgment and thought content normal.  Nursing note and vitals reviewed.     Assessment & Plan:  1. Uncontrolled type 2 diabetes mellitus without complication, without long-term current use of insulin (HCC) - POC HgB A1c- 6.1 -  Has increased slightly from 6.0.  - Decrease carbs and exercise more often  - No change in medications at this time.  - Follow up in 6 months   Dorothyann Peng, NP

## 2016-07-13 ENCOUNTER — Other Ambulatory Visit: Payer: Self-pay | Admitting: Adult Health

## 2016-07-13 NOTE — Telephone Encounter (Signed)
Ok to refill for one year  

## 2016-08-02 ENCOUNTER — Other Ambulatory Visit: Payer: Self-pay | Admitting: Adult Health

## 2016-10-15 ENCOUNTER — Other Ambulatory Visit: Payer: Self-pay | Admitting: Adult Health

## 2016-10-18 NOTE — Telephone Encounter (Signed)
Sent to the pharmacy by e-scribe. 

## 2016-10-19 ENCOUNTER — Other Ambulatory Visit: Payer: Self-pay | Admitting: Adult Health

## 2016-10-20 NOTE — Telephone Encounter (Signed)
DUPLICATE REQUEST.  FILLED ON 10/18/2016

## 2016-10-28 ENCOUNTER — Telehealth: Payer: Self-pay | Admitting: Adult Health

## 2016-10-28 NOTE — Telephone Encounter (Signed)
Pt would like you to know she is going to see the dr at the living facility, [facility] where she resides  They are under [place] , so all her info is in [place].  Pt wants you to know and says she will miss you, and the only reason she decided to do this change is because she will not have to leave her facility.

## 2016-11-15 ENCOUNTER — Encounter: Payer: Self-pay | Admitting: Internal Medicine

## 2016-11-15 ENCOUNTER — Non-Acute Institutional Stay: Payer: Medicare Other | Admitting: Internal Medicine

## 2016-11-15 VITALS — BP 132/78 | HR 65 | Temp 97.8°F | Resp 16 | Ht 61.0 in | Wt 204.4 lb

## 2016-11-15 DIAGNOSIS — I119 Hypertensive heart disease without heart failure: Secondary | ICD-10-CM

## 2016-11-15 DIAGNOSIS — M1A9XX0 Chronic gout, unspecified, without tophus (tophi): Secondary | ICD-10-CM | POA: Diagnosis not present

## 2016-11-15 DIAGNOSIS — R635 Abnormal weight gain: Secondary | ICD-10-CM | POA: Diagnosis not present

## 2016-11-15 DIAGNOSIS — E1169 Type 2 diabetes mellitus with other specified complication: Secondary | ICD-10-CM | POA: Diagnosis not present

## 2016-11-15 DIAGNOSIS — E8881 Metabolic syndrome: Secondary | ICD-10-CM | POA: Diagnosis not present

## 2016-11-15 DIAGNOSIS — R200 Anesthesia of skin: Secondary | ICD-10-CM | POA: Diagnosis not present

## 2016-11-15 DIAGNOSIS — R202 Paresthesia of skin: Secondary | ICD-10-CM

## 2016-11-15 DIAGNOSIS — F419 Anxiety disorder, unspecified: Secondary | ICD-10-CM | POA: Diagnosis not present

## 2016-11-15 DIAGNOSIS — M25541 Pain in joints of right hand: Secondary | ICD-10-CM

## 2016-11-15 DIAGNOSIS — E785 Hyperlipidemia, unspecified: Secondary | ICD-10-CM

## 2016-11-15 DIAGNOSIS — E669 Obesity, unspecified: Secondary | ICD-10-CM

## 2016-11-15 NOTE — Progress Notes (Signed)
[facility]  Provider: Blanchie Serve MD   Location:  [place] of Service:  Clinic (12)  PCP: Blanchie Serve, MD Patient Care Team: Blanchie Serve, MD as PCP - General (Internal Medicine) Iran Planas, MD as Consulting Physician (Orthopedic Surgery) Marygrace Drought, MD as Consulting Physician (Ophthalmology)  Extended Emergency Contact Information Primary Emergency Contact: Myers,Sharon  [place] Mobile Phone: [phone] Relation: Daughter Secondary Emergency Contact: Myers,Randall  [place] Mobile Phone: [phone] Relation: Relative  Goals of Care: Advanced Directive information Advanced Directives 11/15/2016  Does Patient Have a Medical Advance Directive? Yes  Type of Paramedic of [place];Living will;Out of facility DNR (pink MOST or yellow form)  Does patient want to make changes to medical advance directive? No - Patient declined  Copy of [place] in Chart? Yes     Chief Complaint  Patient presents with  . New Patient (Initial Visit)    establish care, hand pain, low energy, weight gain  . Medication Refill    No refills needed at this time    HPI: Patient is a 76 y.o. Mcclure seen today to establish care. She was following at [facility] prior to this. She resides at [place]. She had a fall in April 2018 and broke her forearm. She was seen by orthopedic team and conservative management was recommended per patient. No records available for review. Right hand has been swollen for few weeks and this hurts and bothers her. Right forearm bothers her occasionally. Some swelling in left hand as well but right bothers her the most.. Complaints of numbness and tingling to right hand at times, denies any to arm or elbow. Has intermittently checked her cbg at home with few high readings in 170-180 range. Most of the  reading is below 150. Not complaint with her diet. Has history of gout and takes allopurinol. Also on iron supplement for her anemia. Takes her BP med ad BP has been stable. Has not tolerated statin in the past. Feels low in terms of energy. Denies any recent gout flare up. Taking fluoxetine 40 mg daily for her mood and on prn alprazolam.    Past Medical History:  Diagnosis Date  . Allergy   . Anemia   . Anxiety   . Arthritis    with gout  . Carotid bruit    right  . Cataract    bil cataracts removed  . Chest pain   . Colon polyps   . Depression   . Diabetes type 2, uncontrolled ([place])   . Diverticulosis   . Exogenous obesity   . GERD (gastroesophageal reflux disease)   . Hyperlipidemia   . Hypertension   . Metabolic syndrome   . Osteopenia   . Seasonal allergies    seasonal  . Tubular adenoma of colon    Past Surgical History:  Procedure Laterality Date  . COLONOSCOPY     11/90,11/91,1994,2000,2010  . DILATION AND CURETTAGE OF UTERUS    . DILATION AND CURETTAGE OF UTERUS  1997  . FOOT SURGERY     x 2  . HAND SURGERY    . POLYPECTOMY    . TUBAL LIGATION    . uterine polyps removed     x2    reports that she has quit smoking. She has never used smokeless tobacco. She reports that she drinks alcohol. She reports that she does not  use drugs. Social History   Social History  . Marital status: Widowed    Spouse name: N/A  . Number of children: 2  . Years of education: N/A   Occupational History  . retired Retired   Social History Main Topics  . Smoking status: Former Research scientist (life sciences)  . Smokeless tobacco: Never Used  . Alcohol use 0.0 oz/week     Comment: socially  . Drug use: No  . Sexual activity: Not on file   Other Topics Concern  . Not on file   Social History Narrative   Tobacco use, amount per day now: None      Past tobacco use, amount per day: in her late 30s a pack per day      How many years did you use tobacco: 10      Alcohol use (drinks per  week): occasional wine      Diet:      Do you drink/eat things with caffeine? Tea, soft drinks and coffee      Marital status: Married             What year were you married? 1962      Do you live in a house, apartment, assisted living, condo, trailer? Apartment      Is it one or more stories? Yes      How many persons live in your home? 0      Do you have any pets in your home? 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 Yes            How often? Water aerobics       Do you have a living will? Yes      Do you have a DNR form? Yes           If not, do you want to discuss one?      Do you have signed POA/HPOA forms?  Yes                Functional Status Survey:    Family History  Problem Relation Age of Onset  . Colon cancer Mother   . Stroke Father   . Hypertension Father   . Heart failure Maternal Grandmother   . Diabetes Maternal Grandmother        ?  Marland Kitchen Rectal cancer Neg Hx   . Stomach cancer Neg Hx   . Esophageal cancer Neg Hx   . Pancreatic cancer Neg Hx     Health Maintenance  Topic Date Due  . OPHTHALMOLOGY EXAM  02/02/2016  . INFLUENZA VACCINE  10/19/2016  . HEMOGLOBIN A1C  12/02/2016  . URINE MICROALBUMIN  02/21/2017  . FOOT EXAM  03/03/2017  . COLONOSCOPY  04/02/2017  . MAMMOGRAM  04/11/2017  . TETANUS/TDAP  03/02/2025  . DEXA SCAN  Completed  . PNA vac Low Risk Adult  Completed    Allergies  Allergen Reactions  . Crestor [Rosuvastatin Calcium]     Myalgias   . Iodine     Skin rash  . Lipitor [Atorvastatin Calcium]     Myalgias   . Lovastatin     myalgias  . Sulfonamide Derivatives     REACTION: n\T\v  . Sulfur     Made infection worse  . Zetia [Ezetimibe]     Cough, dry    Outpatient Encounter Prescriptions as of 11/15/2016  Medication Sig  . allopurinol (ZYLOPRIM) 100  MG tablet TAKE 2 TABLETS DAILY.  Marland Kitchen ALPRAZolam (XANAX) 0.25 MG tablet Take 0.25 mg by mouth 2 (two) times daily as needed for  anxiety.   . bisoprolol-hydrochlorothiazide (ZIAC) 10-6.25 MG tablet Take 1 tablet by mouth daily.  . Calcium Carbonate-Vit D-Min (CALCIUM 1200 PO) Take 1 tablet by mouth daily.  . Ferrous Sulfate (IRON) 325 (65 Fe) MG TABS Take 1 tablet by mouth daily.   . Flaxseed, Linseed, (FLAXSEED OIL PO) Take 1 tablet by mouth daily.  Marland Kitchen FLUoxetine (PROZAC) 40 MG capsule TAKE (1) CAPSULE DAILY.  Marland Kitchen glucose blood (ACCU-CHEK SMARTVIEW) test strip Use to check blood sugar three times a day and PRN,  Dx code: E11.65  . Lancets (ACCU-CHEK SOFT TOUCH) lancets Use as instructed  . metFORMIN (GLUCOPHAGE) 500 MG tablet TAKE 1 TABLET(500 MG) BY MOUTH DAILY WITH BREAKFAST  . Omega-3 Fatty Acids (FISH OIL) 1000 MG CAPS Take 1 capsule by mouth daily.   . [DISCONTINUED] IRON PO Take 65 mg by mouth daily.  . [DISCONTINUED] cyanocobalamin 500 MCG tablet Take 500 mcg by mouth daily.   No facility-administered encounter medications on file as of 11/15/2016.     Review of Systems  Constitutional: Positive for unexpected weight change. Negative for appetite change, chills and fever.  HENT: Positive for hearing loss and rhinorrhea. Negative for ear discharge, ear pain, mouth sores, sore throat and trouble swallowing.   Eyes:       History of cataract surgery, has corrective lenses, follows with eye doctor once a year  Respiratory: Negative for cough, shortness of breath and wheezing.   Cardiovascular: Negative for chest pain, palpitations and leg swelling.  Gastrointestinal: Negative for abdominal pain, blood in stool, constipation, diarrhea, nausea and vomiting.  Endocrine: Positive for polydipsia. Negative for cold intolerance.  Genitourinary: Positive for frequency. Negative for difficulty urinating, dysuria, flank pain, hematuria and vaginal bleeding.  Musculoskeletal: Positive for arthralgias, back pain and joint swelling. Negative for gait problem.       History of herniated disc, denies surgery. Fell end of April, no  further fall  Skin: Negative for wound.       History of eczema, lotions tend to help  Neurological: Positive for numbness. Negative for dizziness, seizures, syncope and headaches.  Hematological: Bruises/bleeds easily.  Psychiatric/Behavioral: Positive for dysphoric mood. Negative for hallucinations, sleep disturbance and suicidal ideas. The patient is nervous/anxious.        Has some stress regarding her neighbors health condition,she is close to him    Vitals:   11/15/16 1016  BP: 132/78  Pulse: 65  Resp: 16  Temp: 97.8 F (36.6 C)  TempSrc: Oral  SpO2: 98%  Weight: 204 lb 6.4 oz (92.7 kg)  Height: '5\' 1"'  (1.549 m)   Body mass index is 38.62 kg/m.   Wt Readings from Last 3 Encounters:  11/15/16 204 lb 6.4 oz (92.7 kg)  06/01/16 186 lb 12.8 oz (84.7 kg)  03/03/16 182 lb 12.8 oz (82.9 kg)    Physical Exam  Constitutional: She is oriented to person, place, and time.  Obese, elderly Mcclure in no distress  HENT:  Head: Normocephalic and atraumatic.  Right Ear: External ear normal.  Left Ear: External ear normal.  Nose: Nose normal.  Mouth/Throat: Oropharynx is clear and moist. No oropharyngeal exudate.  Mass to hard palate  Eyes: Pupils are equal, round, and reactive to light. Conjunctivae and EOM are normal. Right eye exhibits no discharge. Left eye exhibits no discharge. No scleral icterus.  Neck:  Normal range of motion. No JVD present. No tracheal deviation present. No thyromegaly present.  Cardiovascular: Normal rate and regular rhythm.   No murmur heard. Pulmonary/Chest: Effort normal and breath sounds normal. No respiratory distress. She has no wheezes. She has no rales. She exhibits no tenderness.  Abdominal: Soft. Bowel sounds are normal. She exhibits no distension. There is no tenderness. There is no guarding.  Musculoskeletal: Normal range of motion. She exhibits no edema.  Good ROM with shoulder and elbow, swelling to her right hand and fingers, tenderness to  DIP and PIP joints, warm to touch and mild erythema. Making a fist to right hand is painful. Palpable dorsalis pedis  Lymphadenopathy:    She has no cervical adenopathy.  Neurological: She is alert and oriented to person, place, and time. She displays normal reflexes. No cranial nerve deficit. Coordination normal.  Skin: Skin is warm and dry. She is not diaphoretic. No pallor.  Eczema to left lower back area and arms  Psychiatric: She has a normal mood and affect. Her behavior is normal. Thought content normal.    Labs reviewed: Basic Metabolic Panel:  Recent Labs  02/22/16 0833  NA 137  K 4.8  CL 102  CO2 30  GLUCOSE 100*  BUN 21  CREATININE 1.15  CALCIUM 9.3   Liver Function Tests:  Recent Labs  02/22/16 0833  AST 17  ALT 12  ALKPHOS 79  BILITOT 0.6  PROT 6.7  ALBUMIN 3.9   No results for input(s): LIPASE, AMYLASE in the last 8760 hours. No results for input(s): AMMONIA in the last 8760 hours. CBC:  Recent Labs  02/16/16 1331 02/22/16 0833 05/27/16 1118  WBC 9.4 7.9 9.6  NEUTROABS 6.2 5.6 6.6  HGB 11.6* 11.9* 12.7  HCT 35.2* 36.3 38.3  MCV 89.6 90.6 93.4  PLT 249.0 262.0 271.0   Cardiac Enzymes: No results for input(s): CKTOTAL, CKMB, CKMBINDEX, TROPONINI in the last 8760 hours. BNP: Invalid input(s): POCBNP Lab Results  Component Value Date   HGBA1C 6.1 06/01/2016   Lab Results  Component Value Date   TSH 1.Brittany 02/22/2016   No results found for: VITAMINB12 No results found for: FOLATE Lab Results  Component Value Date   IRON 68 05/27/2016   FERRITIN 32.5 05/27/2016    Lipid Panel:  Recent Labs  02/22/16 0833  CHOL 207*  HDL 51.80  LDLCALC 126*  TRIG 146.0  CHOLHDL 4   Lab Results  Component Value Date   HGBA1C 6.1 06/01/2016    Procedures since last visit: No results found.  Assessment/Plan  1. Benign hypertensive heart disease without heart failure - CBC with Differential/Platelets; Future  2. Diabetes mellitus type 2  in obese (HCC) Continue metformin for now. Normal foot exam. Seen by eye doctor yearly.  - CMP with eGFR; Future - Lipid Panel; Future - CBC with Differential/Platelets; Future - TSH; Future - Hemoglobin A1c; Future  3. Weight gain Rule out thyroid abnormality and worsening of diabetes. Exercise and dietary counselling - TSH; Future  4. Dyslipidemia Has not tolerated statin in past. On fish oil.  - Lipid Panel; Future  5. Metabolic syndrome With DM, HLD and obesity. Portion control, daily exercise encouraged - TSH; Future - Hemoglobin A1c; Future  6. Joint pain in fingers of right hand With swelling and tenderness and its improvement as day progresses, concern for arthritis with flare up. Check esr and RF to assess further. Unlikely to be gout flare up given multiple small joint involvement. To take  tylenol as needed for pain for now - Sedimentation Rate; Future - Rheumatoid Factor; Future  7. Anxiety Continue prozac and prn alprazolam for now. Monitor clinically.  - TSH; Future  8. Numbness and tingling of right hand Post neuropathy following her recent ? Radius fracture. obtain prior records. Another possibility is of neuropathy with her DM.   9. Chronic gout without tophus, unspecified cause, unspecified site Continue allopurinol for now - Uric Acid; Future    Labs/tests ordered:  As above  Next appointment: 3-4 weeks  Communication:  I spent 60 minutes in total face-to-face time with the patient, more than 50% of which was spent in counseling and coordination of care, reviewing test results, reviewing medication and discussing or reviewing the diagnosis with patient.      Blanchie Serve, MD Internal Medicine [facility] [address] [place], [place] [address] Cell Phone (Monday-Friday 8 am - 5 pm): [phone] On Call: [phone] and follow prompts after 5 pm and on weekends Office Phone: [phone] Office Fax:  [phone]

## 2016-11-22 LAB — CBC WITH DIFFERENTIAL/PLATELET
BASOS ABS: 0 {cells}/uL (ref 0–200)
Basophils Relative: 0 %
EOS ABS: 279 {cells}/uL (ref 15–500)
Eosinophils Relative: 3 %
HCT: 35.8 % (ref 35.0–45.0)
HEMOGLOBIN: 12.3 g/dL (ref 11.7–15.5)
LYMPHS ABS: 2046 {cells}/uL (ref 850–3900)
Lymphocytes Relative: 22 %
MCH: 30.4 pg (ref 27.0–33.0)
MCHC: 34.4 g/dL (ref 32.0–36.0)
MCV: 88.4 fL (ref 80.0–100.0)
MONO ABS: 651 {cells}/uL (ref 200–950)
MPV: 9.9 fL (ref 7.5–12.5)
Monocytes Relative: 7 %
NEUTROS ABS: 6324 {cells}/uL (ref 1500–7800)
Neutrophils Relative %: 68 %
Platelets: 266 10*3/uL (ref 140–400)
RBC: 4.05 MIL/uL (ref 3.80–5.10)
RDW: 13.8 % (ref 11.0–15.0)
WBC: 9.3 10*3/uL (ref 3.8–10.8)

## 2016-11-22 LAB — URIC ACID: Uric Acid, Serum: 5.2 mg/dL (ref 2.5–7.0)

## 2016-11-22 LAB — SEDIMENTATION RATE: SED RATE: 36 mm/h — AB (ref 0–30)

## 2016-11-23 LAB — COMPLETE METABOLIC PANEL WITH GFR
ALBUMIN: 4 g/dL (ref 3.6–5.1)
ALT: 13 U/L (ref 6–29)
AST: 16 U/L (ref 10–35)
Alkaline Phosphatase: 83 U/L (ref 33–130)
BUN: 22 mg/dL (ref 7–25)
CALCIUM: 9 mg/dL (ref 8.6–10.4)
CO2: 26 mmol/L (ref 20–32)
Chloride: 102 mmol/L (ref 98–110)
Creat: 1 mg/dL — ABNORMAL HIGH (ref 0.60–0.93)
GFR, EST NON AFRICAN AMERICAN: 54 mL/min — AB (ref >=60)
GFR, Est African American: 62 mL/min (ref >=60)
Glucose, Bld: 109 mg/dL — ABNORMAL HIGH (ref 65–99)
POTASSIUM: 4.6 mmol/L (ref 3.5–5.3)
Sodium: 136 mmol/L (ref 135–146)
Total Bilirubin: 0.6 mg/dL (ref 0.2–1.2)
Total Protein: 6.4 g/dL (ref 6.1–8.1)

## 2016-11-23 LAB — HEMOGLOBIN A1C
Hgb A1c MFr Bld: 6.2 % — ABNORMAL HIGH (ref <5.7)
Mean Plasma Glucose: 131 mg/dL

## 2016-11-23 LAB — RHEUMATOID FACTOR

## 2016-11-23 LAB — TSH: TSH: 2.01 m[IU]/L

## 2016-11-23 LAB — LIPID PANEL
CHOLESTEROL: 251 mg/dL — AB (ref <200)
HDL: 46 mg/dL — ABNORMAL LOW (ref >50)
LDL Cholesterol: 163 mg/dL — ABNORMAL HIGH (ref <100)
Total CHOL/HDL Ratio: 5.5 Ratio — ABNORMAL HIGH (ref <5.0)
Triglycerides: 208 mg/dL — ABNORMAL HIGH (ref <150)
VLDL: 42 mg/dL — ABNORMAL HIGH (ref <30)

## 2016-12-06 ENCOUNTER — Non-Acute Institutional Stay: Payer: Medicare Other | Admitting: Internal Medicine

## 2016-12-06 ENCOUNTER — Encounter: Payer: Self-pay | Admitting: Internal Medicine

## 2016-12-06 VITALS — BP 128/78 | HR 62 | Temp 98.3°F | Resp 20 | Ht 62.0 in | Wt 201.0 lb

## 2016-12-06 DIAGNOSIS — E669 Obesity, unspecified: Secondary | ICD-10-CM | POA: Diagnosis not present

## 2016-12-06 DIAGNOSIS — M79641 Pain in right hand: Secondary | ICD-10-CM

## 2016-12-06 DIAGNOSIS — N183 Chronic kidney disease, stage 3 unspecified: Secondary | ICD-10-CM

## 2016-12-06 DIAGNOSIS — E785 Hyperlipidemia, unspecified: Secondary | ICD-10-CM | POA: Diagnosis not present

## 2016-12-06 DIAGNOSIS — E1169 Type 2 diabetes mellitus with other specified complication: Secondary | ICD-10-CM | POA: Diagnosis not present

## 2016-12-06 DIAGNOSIS — G8929 Other chronic pain: Secondary | ICD-10-CM | POA: Diagnosis not present

## 2016-12-06 NOTE — Progress Notes (Signed)
[facility]  Provider: Blanchie Serve MD   Location:  [place] of Service:  Clinic (12)  PCP: Blanchie Serve, MD Patient Care Team: Blanchie Serve, MD as PCP - General (Internal Medicine) Iran Planas, MD as Consulting Physician (Orthopedic Surgery) Marygrace Drought, MD as Consulting Physician (Ophthalmology)  Extended Emergency Contact Information Primary Emergency Contact: Myers,Sharon  [place] Mobile Phone: [phone] Relation: Daughter Secondary Emergency Contact: Myers,Randall  [place] Mobile Phone: [phone] Relation: Relative  Goals of Care: Advanced Directive information Advanced Directives 11/15/2016  Does Patient Have a Medical Advance Directive? Yes  Type of Paramedic of [place];Living will;Out of facility DNR (pink MOST or yellow form)  Does patient want to make changes to medical advance directive? No - Patient declined  Copy of [place] in Chart? Yes     Chief Complaint  Patient presents with  . Acute Visit    swelling to right hand persists with pain  . Medication Refill    No refills needed at this time.   Marland Kitchen Results    Discuss labs.     HPI: Patient is a 76 y.o. female seen today for follow up of right hand pain and swelling with intermittent pain radiation to right forearm.   DM- blood sugar stable at home, tolerating metformin well, a1c reviewed  Hyperlipidemia- labs resulted, uncontrolled. Has not tolerated ezetemibe and statin in past  ckd stage 3- reviewed labs.    Past Medical History:  Diagnosis Date  . Allergy   . Anemia   . Anxiety   . Arthritis    with gout  . Carotid bruit    right  . Cataract    bil cataracts removed  . Chest pain   . Colon polyps   . Depression   . Diabetes type 2, uncontrolled ([place])   . Diverticulosis   . Exogenous obesity   . GERD (gastroesophageal reflux disease)   .  Hyperlipidemia   . Hypertension   . Metabolic syndrome   . Osteopenia   . Seasonal allergies    seasonal  . Tubular adenoma of colon    Past Surgical History:  Procedure Laterality Date  . COLONOSCOPY     11/90,11/91,1994,2000,2010  . DILATION AND CURETTAGE OF UTERUS    . DILATION AND CURETTAGE OF UTERUS  1997  . FOOT SURGERY     x 2  . HAND SURGERY    . POLYPECTOMY    . TUBAL LIGATION    . uterine polyps removed     x2    reports that she has quit smoking. She has never used smokeless tobacco. She reports that she drinks alcohol. She reports that she does not use drugs. Social History   Social History  . Marital status: Widowed    Spouse name: N/A  . Number of children: 2  . Years of education: N/A   Occupational History  . retired Retired   Social History Main Topics  . Smoking status: Former Research scientist (life sciences)  . Smokeless tobacco: Never Used  . Alcohol use 0.0 oz/week     Comment: socially  . Drug use: No  . Sexual activity: Not on file   Other Topics Concern  . Not on file   Social History Narrative   Tobacco use, amount per day now: None      Past tobacco use, amount per day: in her late 25s a pack per  day      How many years did you use tobacco: 10      Alcohol use (drinks per week): occasional wine      Diet:      Do you drink/eat things with caffeine? Tea, soft drinks and coffee      Marital status: Married             What year were you married? 1962      Do you live in a house, apartment, assisted living, condo, trailer? Apartment      Is it one or more stories? Yes      How many persons live in your home? 0      Do you have any pets in your home? 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 Yes            How often? Water aerobics       Do you have a living will? Yes      Do you have a DNR form? Yes           If not, do you want to discuss one?      Do you have signed POA/HPOA forms?  Yes                 Functional Status Survey:    Family History  Problem Relation Age of Onset  . Colon cancer Mother   . Stroke Father   . Hypertension Father   . Heart failure Maternal Grandmother   . Diabetes Maternal Grandmother        ?  Marland Kitchen Rectal cancer Neg Hx   . Stomach cancer Neg Hx   . Esophageal cancer Neg Hx   . Pancreatic cancer Neg Hx     Health Maintenance  Topic Date Due  . OPHTHALMOLOGY EXAM  02/02/2016  . INFLUENZA VACCINE  12/19/2016 (Originally 10/19/2016)  . URINE MICROALBUMIN  02/21/2017  . COLONOSCOPY  04/02/2017  . MAMMOGRAM  04/11/2017  . HEMOGLOBIN A1C  05/22/2017  . FOOT EXAM  11/15/2017  . TETANUS/TDAP  03/02/2025  . DEXA SCAN  Completed  . PNA vac Low Risk Adult  Completed    Allergies  Allergen Reactions  . Crestor [Rosuvastatin Calcium]     Myalgias   . Iodine     Skin rash  . Lipitor [Atorvastatin Calcium]     Myalgias   . Lovastatin     myalgias  . Sulfonamide Derivatives     REACTION: n\T\v  . Sulfur     Made infection worse  . Zetia [Ezetimibe]     Cough, dry    Outpatient Encounter Prescriptions as of 12/06/2016  Medication Sig  . allopurinol (ZYLOPRIM) 100 MG tablet TAKE 2 TABLETS DAILY.  Marland Kitchen ALPRAZolam (XANAX) 0.25 MG tablet Take 0.25 mg by mouth 2 (two) times daily as needed for anxiety.   . bisoprolol-hydrochlorothiazide (ZIAC) 10-6.25 MG tablet Take 1 tablet by mouth daily.  . Calcium Carbonate-Vit D-Min (CALCIUM 1200 PO) Take 1 tablet by mouth daily.  . clobetasol cream (TEMOVATE) 6.96 % Apply 1 application topically 2 (two) times daily. Apply to lower back and upper extremities x 2 weeks.  . Ferrous Sulfate (IRON) 325 (65 Fe) MG TABS Take 1 tablet by mouth daily.   . Flaxseed, Linseed, (FLAXSEED OIL PO) Take 1 tablet by mouth daily.  Marland Kitchen FLUoxetine (PROZAC) 40 MG capsule TAKE (1) CAPSULE DAILY.  Marland Kitchen  glucose blood (ACCU-CHEK SMARTVIEW) test strip Use to check blood sugar three times a day and PRN,  Dx code: E11.65  . Lancets (ACCU-CHEK  SOFT TOUCH) lancets Use as instructed  . metFORMIN (GLUCOPHAGE) 500 MG tablet TAKE 1 TABLET(500 MG) BY MOUTH DAILY WITH BREAKFAST  . Omega-3 Fatty Acids (FISH OIL) 1000 MG CAPS Take 1 capsule by mouth daily.    No facility-administered encounter medications on file as of 12/06/2016.     Review of Systems  Constitutional: Negative for appetite change and fever.  HENT: Positive for hearing loss.   Eyes:       History of cataract surgery, has corrective lenses, follows with eye doctor once a year  Respiratory: Negative for shortness of breath.   Cardiovascular: Negative for chest pain and leg swelling.  Gastrointestinal: Negative for abdominal pain and constipation.  Musculoskeletal: Positive for arthralgias, back pain and joint swelling. Negative for gait problem.       History of herniated disc, denies surgery. Fell end of April, no further fall  Skin:       History of eczema, lotions tend to help  Neurological: Positive for numbness. Negative for dizziness, weakness and headaches.  Hematological: Bruises/bleeds easily.  Psychiatric/Behavioral: Positive for dysphoric mood. The patient is nervous/anxious.     Vitals:   12/06/16 1139  BP: 128/78  Pulse: 62  Resp: 20  Temp: 98.3 F (36.8 C)  TempSrc: Oral  SpO2: 95%  Weight: 201 lb (91.2 kg)  Height: 5' 2" (1.575 m)   Body mass index is 36.76 kg/m.   Wt Readings from Last 3 Encounters:  12/06/16 201 lb (91.2 kg)  11/15/16 204 lb 6.4 oz (92.7 kg)  06/01/16 186 lb 12.8 oz (84.7 kg)    Physical Exam  Constitutional: She is oriented to person, place, and time.  Obese, elderly female in no distress  HENT:  Head: Normocephalic and atraumatic.  Mass to hard palate  Eyes: Pupils are equal, round, and reactive to light. Conjunctivae and EOM are normal. No scleral icterus.  Neck: Normal range of motion.  Cardiovascular: Normal rate and regular rhythm.   No murmur heard. Pulmonary/Chest: Effort normal and breath sounds normal.  She has no wheezes. She has no rales.  Abdominal: Soft. Bowel sounds are normal. There is no tenderness.  Musculoskeletal: Normal range of motion. She exhibits no edema.  Good ROM with shoulder and elbow, swelling to her right hand and fingers, tenderness to DIP and PIP joints normal temperature to touch. No erythema. Making a fist to right hand is painful but able to close it. Palpable dorsalis pedis. Negative tinel's signs and phalen's sign  Lymphadenopathy:    She has no cervical adenopathy.  Neurological: She is alert and oriented to person, place, and time. She displays normal reflexes. No cranial nerve deficit. Coordination normal.  Skin: Skin is warm and dry. She is not diaphoretic.  Psychiatric: She has a normal mood and affect. Her behavior is normal. Thought content normal.    Labs reviewed: Basic Metabolic Panel:  Recent Labs  02/22/16 0833 11/22/16 0740  NA 137 136  K 4.8 4.6  CL 102 102  CO2 30 26  GLUCOSE 100* 109*  BUN 21 22  CREATININE 1.15 1.00*  CALCIUM 9.3 9.0   Liver Function Tests:  Recent Labs  02/22/16 0833 11/22/16 0740  AST 17 16  ALT 12 13  ALKPHOS 79 83  BILITOT 0.6 0.6  PROT 6.7 6.4  ALBUMIN 3.9 4.0   No  results for input(s): LIPASE, AMYLASE in the last 8760 hours. No results for input(s): AMMONIA in the last 8760 hours. CBC:  Recent Labs  02/22/16 0833 05/27/16 1118 11/22/16 0740  WBC 7.9 9.6 9.3  NEUTROABS 5.6 6.6 6,324  HGB 11.9* 12.7 12.3  HCT 36.3 38.3 35.8  MCV 90.6 93.4 88.4  PLT 262.0 271.0 266   Cardiac Enzymes: No results for input(s): CKTOTAL, CKMB, CKMBINDEX, TROPONINI in the last 8760 hours. BNP: Invalid input(s): POCBNP Lab Results  Component Value Date   HGBA1C 6.2 (H) 11/22/2016   Lab Results  Component Value Date   TSH 2.01 11/22/2016   No results found for: VITAMINB12 No results found for: FOLATE Lab Results  Component Value Date   IRON 68 05/27/2016   FERRITIN 32.5 05/27/2016    Lipid  Panel:  Recent Labs  02/22/16 0833 11/22/16 0740  CHOL 207* 251*  HDL 51.80 46*  LDLCALC 126* 163*  TRIG 146.0 208*  CHOLHDL 4 5.5*   Lab Results  Component Value Date   HGBA1C 6.2 (H) 11/22/2016    Procedures since last visit: No results found.  Assessment/Plan  1. Diabetes mellitus type 2 in obese (HCC) Stable, continue metformin 500 mg daily.  - BMP with eGFR; Future - Hemoglobin A1c; Future  2. Chronic pain of right hand Ongoing. Pt mentions pain starting after fracture of bone in her forearm- unsure if radius or ulna. No surgery was done. Has good ROM to elbow and wrsit. Fingers are swollen along with her hand but no erythema, normal temp. Gout has been ruled out. Mildly elevated ESR and negative RF. Along with this has some numbness and tingling. Advised to take tylenol OTC 650 mg bid for now and to wear wrist brace. If no improvement, obtain nerve conduction study. Also to obtain imaging result from orthopedics.   3. Dyslipidemia C/w fish oil, has not tolerated statin and ezetemibe. Dietary counselling.   4. ckd stage 3 Reviewed BMP, has DM and HTN. monitor   Labs/tests ordered:  As above  Next appointment: 3 month   Blanchie Serve, MD Internal Medicine [place], [place] [address] Cell Phone (Monday-Friday 8 am - 5 pm): [phone] On Call: [phone] and follow prompts after 5 pm and on weekends Office Phone: [phone] Office Fax: [phone]

## 2016-12-20 ENCOUNTER — Other Ambulatory Visit: Payer: Self-pay | Admitting: Adult Health

## 2016-12-23 ENCOUNTER — Other Ambulatory Visit: Payer: Self-pay | Admitting: *Deleted

## 2016-12-23 MED ORDER — BISOPROLOL-HYDROCHLOROTHIAZIDE 10-6.25 MG PO TABS: 1.0000 | ORAL_TABLET | Freq: Every day | ORAL | 3 refills | Status: DC

## 2016-12-23 MED ORDER — METFORMIN HCL 500 MG PO TABS: ORAL_TABLET | ORAL | 3 refills | Status: DC

## 2016-12-23 MED ORDER — ALPRAZOLAM 0.25 MG PO TABS: 0.2500 mg | ORAL_TABLET | Freq: Two times a day (BID) | ORAL | 0 refills | Status: DC | PRN

## 2016-12-23 NOTE — Telephone Encounter (Signed)
[place] 

## 2017-01-20 ENCOUNTER — Other Ambulatory Visit: Payer: Self-pay | Admitting: Adult Health

## 2017-01-25 ENCOUNTER — Other Ambulatory Visit: Payer: Self-pay | Admitting: *Deleted

## 2017-01-25 MED ORDER — FLUOXETINE HCL 40 MG PO CAPS: ORAL_CAPSULE | ORAL | 0 refills | Status: DC

## 2017-01-25 NOTE — Telephone Encounter (Signed)
[place] 

## 2017-02-03 ENCOUNTER — Other Ambulatory Visit (INDEPENDENT_AMBULATORY_CARE_PROVIDER_SITE_OTHER): Payer: Medicare Other

## 2017-02-03 DIAGNOSIS — D509 Iron deficiency anemia, unspecified: Secondary | ICD-10-CM | POA: Diagnosis not present

## 2017-02-03 LAB — IBC PANEL
IRON: 60 ug/dL (ref 42–145)
SATURATION RATIOS: 16.6 % — AB (ref 20.0–50.0)
TRANSFERRIN: 258 mg/dL (ref 212.0–360.0)

## 2017-02-03 LAB — FERRITIN: Ferritin: 72 ng/mL (ref 10.0–291.0)

## 2017-02-06 LAB — HM DIABETES EYE EXAM

## 2017-02-07 ENCOUNTER — Encounter: Payer: Self-pay | Admitting: *Deleted

## 2017-02-07 ENCOUNTER — Other Ambulatory Visit: Payer: Self-pay

## 2017-02-07 DIAGNOSIS — D509 Iron deficiency anemia, unspecified: Secondary | ICD-10-CM

## 2017-02-21 ENCOUNTER — Non-Acute Institutional Stay: Payer: Medicare Other

## 2017-02-21 VITALS — BP 152/80 | HR 60 | Temp 98.0°F | Ht 62.0 in | Wt 201.0 lb

## 2017-02-21 DIAGNOSIS — Z0000 Encounter for general adult medical examination without abnormal findings: Secondary | ICD-10-CM

## 2017-02-21 MED ORDER — ZOSTER VAC RECOMB ADJUVANTED 50 MCG/0.5ML IM SUSR: 0.5000 mL | Freq: Once | INTRAMUSCULAR | 1 refills | Status: AC

## 2017-02-21 NOTE — Patient Instructions (Signed)
Brittany Mcclure , Thank you for taking time to come for your Medicare Wellness Visit. I appreciate your ongoing commitment to your health goals. Please review the following plan we discussed and let me know if I can assist you in the future.   Screening recommendations/referrals: Colonoscopy excluded, you are over age 78 Mammogram excluded, you are over age 43 Bone Density up to date Recommended yearly ophthalmology/optometry visit for glaucoma screening and checkup Recommended yearly dental visit for hygiene and checkup  Vaccinations: Influenza vaccine up to date. Due 2019 fall season Pneumococcal vaccine up to date Tdap vaccine up to date. Due 03/02/2025 Shingles vaccine due. Prescription sent to pharmacy  Advanced directives: In Chart  Conditions/risks identified: None  Next appointment: None upcoming   Preventive Care 65 Years and Older, Female Preventive care refers to lifestyle choices and visits with your health care provider that can promote health and wellness. What does preventive care include?  A yearly physical exam. This is also called an annual well check.  Dental exams once or twice a year.  Routine eye exams. Ask your health care provider how often you should have your eyes checked.  Personal lifestyle choices, including:  Daily care of your teeth and gums.  Regular physical activity.  Eating a healthy diet.  Avoiding tobacco and drug use.  Limiting alcohol use.  Practicing safe sex.  Taking low-dose aspirin every day.  Taking vitamin and mineral supplements as recommended by your health care provider. What happens during an annual well check? The services and screenings done by your health care provider during your annual well check will depend on your age, overall health, lifestyle risk factors, and family history of disease. Counseling  Your health care provider may ask you questions about your:  Alcohol use.  Tobacco use.  Drug  use.  Emotional well-being.  Home and relationship well-being.  Sexual activity.  Eating habits.  History of falls.  Memory and ability to understand (cognition).  Work and work Statistician.  Reproductive health. Screening  You may have the following tests or measurements:  Height, weight, and BMI.  Blood pressure.  Lipid and cholesterol levels. These may be checked every 5 years, or more frequently if you are over 95 years old.  Skin check.  Lung cancer screening. You may have this screening every year starting at age 78 if you have a 30-pack-year history of smoking and currently smoke or have quit within the past 15 years.  Fecal occult blood test (FOBT) of the stool. You may have this test every year starting at age 63.  Flexible sigmoidoscopy or colonoscopy. You may have a sigmoidoscopy every 5 years or a colonoscopy every 10 years starting at age 6.  Hepatitis C blood test.  Hepatitis B blood test.  Sexually transmitted disease (STD) testing.  Diabetes screening. This is done by checking your blood sugar (glucose) after you have not eaten for a while (fasting). You may have this done every 1-3 years.  Bone density scan. This is done to screen for osteoporosis. You may have this done starting at age 32.  Mammogram. This may be done every 1-2 years. Talk to your health care provider about how often you should have regular mammograms. Talk with your health care provider about your test results, treatment options, and if necessary, the need for more tests. Vaccines  Your health care provider may recommend certain vaccines, such as:  Influenza vaccine. This is recommended every year.  Tetanus, diphtheria, and acellular pertussis (Tdap,  Td) vaccine. You may need a Td booster every 10 years.  Zoster vaccine. You may need this after age 50.  Pneumococcal 13-valent conjugate (PCV13) vaccine. One dose is recommended after age 66.  Pneumococcal polysaccharide  (PPSV23) vaccine. One dose is recommended after age 37. Talk to your health care provider about which screenings and vaccines you need and how often you need them. This information is not intended to replace advice given to you by your health care provider. Make sure you discuss any questions you have with your health care provider. Document Released: 04/03/2015 Document Revised: 11/25/2015 Document Reviewed: 01/06/2015 Elsevier Interactive Patient Education  2017 [place] Prevention in the Home Falls can cause injuries. They can happen to people of all ages. There are many things you can do to make your home safe and to help prevent falls. What can I do on the outside of my home?  Regularly fix the edges of walkways and driveways and fix any cracks.  Remove anything that might make you trip as you walk through a door, such as a raised step or threshold.  Trim any bushes or trees on the path to your home.  Use bright outdoor lighting.  Clear any walking paths of anything that might make someone trip, such as rocks or tools.  Regularly check to see if handrails are loose or broken. Make sure that both sides of any steps have handrails.  Any raised decks and porches should have guardrails on the edges.  Have any leaves, snow, or ice cleared regularly.  Use sand or salt on walking paths during winter.  Clean up any spills in your garage right away. This includes oil or grease spills. What can I do in the bathroom?  Use night lights.  Install grab bars by the toilet and in the tub and shower. Do not use towel bars as grab bars.  Use non-skid mats or decals in the tub or shower.  If you need to sit down in the shower, use a plastic, non-slip stool.  Keep the floor dry. Clean up any water that spills on the floor as soon as it happens.  Remove soap buildup in the tub or shower regularly.  Attach bath mats securely with double-sided non-slip rug tape.  Do not have  throw rugs and other things on the floor that can make you trip. What can I do in the bedroom?  Use night lights.  Make sure that you have a light by your bed that is easy to reach.  Do not use any sheets or blankets that are too big for your bed. They should not hang down onto the floor.  Have a firm chair that has side arms. You can use this for support while you get dressed.  Do not have throw rugs and other things on the floor that can make you trip. What can I do in the kitchen?  Clean up any spills right away.  Avoid walking on wet floors.  Keep items that you use a lot in easy-to-reach places.  If you need to reach something above you, use a strong step stool that has a grab bar.  Keep electrical cords out of the way.  Do not use floor polish or wax that makes floors slippery. If you must use wax, use non-skid floor wax.  Do not have throw rugs and other things on the floor that can make you trip. What can I do with my stairs?  Do not  leave any items on the stairs.  Make sure that there are handrails on both sides of the stairs and use them. Fix handrails that are broken or loose. Make sure that handrails are as long as the stairways.  Check any carpeting to make sure that it is firmly attached to the stairs. Fix any carpet that is loose or worn.  Avoid having throw rugs at the top or bottom of the stairs. If you do have throw rugs, attach them to the floor with carpet tape.  Make sure that you have a light switch at the top of the stairs and the bottom of the stairs. If you do not have them, ask someone to add them for you. What else can I do to help prevent falls?  Wear shoes that:  Do not have high heels.  Have rubber bottoms.  Are comfortable and fit you well.  Are closed at the toe. Do not wear sandals.  If you use a stepladder:  Make sure that it is fully opened. Do not climb a closed stepladder.  Make sure that both sides of the stepladder are  locked into place.  Ask someone to hold it for you, if possible.  Clearly mark and make sure that you can see:  Any grab bars or handrails.  First and last steps.  Where the edge of each step is.  Use tools that help you move around (mobility aids) if they are needed. These include:  Canes.  Walkers.  Scooters.  Crutches.  Turn on the lights when you go into a dark area. Replace any light bulbs as soon as they burn out.  Set up your furniture so you have a clear path. Avoid moving your furniture around.  If any of your floors are uneven, fix them.  If there are any pets around you, be aware of where they are.  Review your medicines with your doctor. Some medicines can make you feel dizzy. This can increase your chance of falling. Ask your doctor what other things that you can do to help prevent falls. This information is not intended to replace advice given to you by your health care provider. Make sure you discuss any questions you have with your health care provider. Document Released: 01/01/2009 Document Revised: 08/13/2015 Document Reviewed: 04/11/2014 Elsevier Interactive Patient Education  2017 [employer].

## 2017-02-21 NOTE — Progress Notes (Signed)
Subjective:   Brittany Mcclure is a 76 y.o. female who presents for an Initial Medicare Annual Wellness Visit at [facility]        Objective:    Today's Vitals   02/21/17 1324 02/21/17 1325  BP: (!) 152/80   Pulse: 60   Temp: 98 F (36.7 C)   TempSrc: Oral   SpO2: 97%   Weight: 201 lb (91.2 kg)   Height: 5\' 2"  (1.575 m)   PainSc:  8    Body mass index is 36.76 kg/m.  Advanced Directives 02/21/2017 02/21/2017 11/15/2016 01/21/2015 03/17/2014  Does Patient Have a Medical Advance Directive? Yes Yes Yes Yes Yes  Type of Paramedic of [place];Out of facility DNR (pink MOST or yellow form) - [place];Living will;Out of facility DNR (pink MOST or yellow form) - Living will;Healthcare Power of Attorney  Does patient want to make changes to medical advance directive? No - Patient declined - No - Patient declined - -  Copy of [place] in Chart? Yes - Yes - -  Pre-existing out of facility DNR order (yellow form or pink MOST form) Yellow form placed in chart (order not valid for inpatient use);Pink MOST form placed in chart (order not valid for inpatient use) - - - -    Current Medications (verified) Outpatient Encounter Medications as of 02/21/2017  Medication Sig  . allopurinol (ZYLOPRIM) 100 MG tablet TAKE 2 TABLETS DAILY.  Marland Kitchen ALPRAZolam (XANAX) 0.25 MG tablet Take 1 tablet (0.25 mg total) by mouth 2 (two) times daily as needed for anxiety.  . bisoprolol-hydrochlorothiazide (ZIAC) 10-6.25 MG tablet Take 1 tablet by mouth daily.  . Calcium Carbonate-Vit D-Min (CALCIUM 1200 PO) Take 1 tablet by mouth daily.  . clobetasol cream (TEMOVATE) 2.54 % Apply 1 application topically 2 (two) times daily. Apply to lower back and upper extremities x 2 weeks.  . Ferrous Sulfate (IRON) 325 (65 Fe) MG TABS Take 1 tablet by mouth daily.   . Flaxseed, Linseed, (FLAXSEED OIL PO) Take 1 tablet by mouth daily.   Marland Kitchen FLUoxetine (PROZAC) 40 MG capsule Take one capsule by mouth once daily  . glucose blood (ACCU-CHEK SMARTVIEW) test strip Use to check blood sugar three times a day and PRN,  Dx code: E11.65  . Lancets (ACCU-CHEK SOFT TOUCH) lancets Use as instructed  . metFORMIN (GLUCOPHAGE) 500 MG tablet TAKE 1 TABLET(500 MG) BY MOUTH DAILY WITH BREAKFAST  . Omega-3 Fatty Acids (FISH OIL) 1000 MG CAPS Take 1 capsule by mouth daily.   Marland Kitchen Zoster Vaccine Adjuvanted [facility]) injection Inject 0.5 mLs into the muscle once for 1 dose.  . [DISCONTINUED] Zoster Vaccine Adjuvanted [facility]) injection Inject 0.5 mLs into the muscle once.   No facility-administered encounter medications on file as of 02/21/2017.     Allergies (verified) Crestor [rosuvastatin calcium]; Iodine; Lipitor [atorvastatin calcium]; Lovastatin; Sulfonamide derivatives; Sulfur; and Zetia [ezetimibe]   History: Past Medical History:  Diagnosis Date  . Allergy   . Anemia   . Anxiety   . Arthritis    with gout  . Carotid bruit    right  . Cataract    bil cataracts removed  . Chest pain   . Colon polyps   . Depression   . Diabetes type 2, uncontrolled ([place])   . Diverticulosis   . Exogenous obesity   . GERD (gastroesophageal reflux disease)   . Hyperlipidemia   . Hypertension   .  Metabolic syndrome   . Osteopenia   . Seasonal allergies    seasonal  . Tubular adenoma of colon    Past Surgical History:  Procedure Laterality Date  . COLONOSCOPY     11/90,11/91,1994,2000,2010  . DILATION AND CURETTAGE OF UTERUS    . DILATION AND CURETTAGE OF UTERUS  1997  . FOOT SURGERY     x 2  . HAND SURGERY    . POLYPECTOMY    . TUBAL LIGATION    . uterine polyps removed     x2   Family History  Problem Relation Age of Onset  . Colon cancer Mother   . Stroke Father   . Hypertension Father   . Heart failure Maternal Grandmother   . Diabetes Maternal Grandmother        ?  Marland Kitchen Rectal cancer Neg Hx   . Stomach cancer Neg Hx   .  Esophageal cancer Neg Hx   . Pancreatic cancer Neg Hx    Social History   Socioeconomic History  . Marital status: Widowed    Spouse name: None  . Number of children: 2  . Years of education: None  . Highest education level: None  Social Needs  . Financial resource strain: Not hard at all  . Food insecurity - worry: Never true  . Food insecurity - inability: Never true  . Transportation needs - medical: No  . Transportation needs - non-medical: No  Occupational History  . Occupation: retired    Fish farm manager: RETIRED  Tobacco Use  . Smoking status: Former Research scientist (life sciences)  . Smokeless tobacco: Never Used  Substance and Sexual Activity  . Alcohol use: Yes    Alcohol/week: 0.0 oz    Comment: socially  . Drug use: No  . Sexual activity: None  Other Topics Concern  . None  Social History Narrative   Tobacco use, amount per day now: None      Past tobacco use, amount per day: in her late 63s a pack per day      How many years did you use tobacco: 10      Alcohol use (drinks per week): occasional wine      Diet:      Do you drink/eat things with caffeine? Tea, soft drinks and coffee      Marital status: Married             What year were you married? 1962      Do you live in a house, apartment, assisted living, condo, trailer? Apartment      Is it one or more stories? Yes      How many persons live in your home? 0      Do you have any pets in your home? 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 Yes            How often? Water aerobics       Do you have a living will? Yes      Do you have a DNR form? Yes           If not, do you want to discuss one?      Do you have signed POA/HPOA forms?  Yes             Tobacco Counseling Counseling given: Not Answered   Clinical Intake:  Pre-visit preparation completed: No  Pain : 0-10 Pain Score: 8  Pain Type: Acute pain Pain Location: Hand Pain Orientation: Right, Left Pain Descriptors /  Indicators: Aching(pt due to get surgery next month) Pain Onset: More than a month ago Pain Frequency: Intermittent     Nutritional Status: BMI > 30  Obese Nutritional Risks: None Diabetes: Yes CBG done?: No Did pt. bring in CBG monitor from home?: No  Activities of Daily Living: Independent Ambulation: Independent with device- listed below Home Assistive Devices/Equipment: Eyeglasses Medication Administration: Independent Home Management: Independent  Barriers to Care Management & Learning: None  Do you feel unsafe in your current relationship?: No Do you feel physically threatened by others?: No Anyone hurting you at home, work, or school?: No Unable to ask?: No Information provided on Community resources: No  How often do you need to have someone help you when you read instructions, pamphlets, or other written materials from your doctor or pharmacy?: 1 - Never What is the last grade level you completed in school?: Associates  Interpreter Needed?: No  Information entered by :: Rich Reining, RN   Activities of Daily Living In your present state of health, do you have any difficulty performing the following activities: 02/21/2017  Hearing? N  Vision? Y  Difficulty concentrating or making decisions? Y  Walking or climbing stairs? N  Dressing or bathing? N  Doing errands, shopping? N  Preparing Food and eating ? N  Using the Toilet? N  In the past six months, have you accidently leaked urine? N  Do you have problems with loss of bowel control? N  Managing your Medications? N  Managing your Finances? N  Housekeeping or managing your Housekeeping? N  Some recent data might be hidden    Timed Get Up and Go Performed:8 seconds, within normal limits  Immunizations and Health Maintenance Immunization History  Administered Date(s) Administered  . Influenza Whole 12/27/2010  . Influenza-Unspecified 10/20/2014  . Pneumococcal-Unspecified 09/19/2014  . Tdap 03/03/2015    . Zoster 09/19/2014   Health Maintenance Due  Topic Date Due  . INFLUENZA VACCINE  10/19/2016  . URINE MICROALBUMIN  02/21/2017    Patient Care Team: Blanchie Serve, MD as PCP - General (Internal Medicine) Iran Planas, MD as Consulting Physician (Orthopedic Surgery) Marygrace Drought, MD as Consulting Physician (Ophthalmology)  Indicate any recent Medical Services you may have received from other than [facility] providers in the past year (date may be approximate).     Assessment:   This is a routine wellness examination for Caffie.   Hearing/Vision screen Hearing Screening Comments: Pt reports no issues with hearing Vision Screening Comments: Sees Dr. Phylliss Blakes  Dietary issues and exercise activities discussed: Current Exercise Habits: Structured exercise class, Type of exercise: Other - see comments(water aerobics), Time (Minutes): 30, Frequency (Times/Week): 3, Weekly Exercise (Minutes/Week): 90, Intensity: Mild, Exercise limited by: None identified  Goals    . Patient Stated     Patient will start journaling and exploring self expressive practices.      Depression Screen PHQ 2/9 Scores 02/21/2017 06/12/2015 02/05/2015 01/28/2015 01/21/2015  PHQ - 2 Score 1 0 0 0 0    Fall Risk Fall Risk  02/21/2017 06/12/2015 02/05/2015 01/28/2015 01/21/2015  Falls in the past year? Yes No No No No  Number falls in past yr: 2 or more - - - -  Injury with Fall? Yes - - - -    Is the patient's home free of loose throw rugs in walkways, pet beds, electrical cords, etc?   yes  Grab bars in the bathroom? yes      Handrails on the stairs?   yes      Adequate lighting?   yes  Cognitive Function: MMSE - Mini Mental State Exam 02/21/2017  Orientation to time 4  Orientation to Place 5  Registration 3  Attention/ Calculation 5  Recall 2  Language- name 2 objects 2  Language- repeat 1  Language- follow 3 step command 3  Language- read & follow direction 1  Write a sentence 1  Copy  design 1  Total score 28        Screening Tests Health Maintenance  Topic Date Due  . INFLUENZA VACCINE  10/19/2016  . URINE MICROALBUMIN  02/21/2017  . COLONOSCOPY  04/02/2017  . MAMMOGRAM  04/11/2017  . HEMOGLOBIN A1C  05/22/2017  . FOOT EXAM  11/15/2017  . OPHTHALMOLOGY EXAM  02/06/2018  . TETANUS/TDAP  03/02/2025  . DEXA SCAN  Completed  . PNA vac Low Risk Adult  Completed    Cancer Screenings: Lung:  Low Dose CT Chest recommended if Age 41-80 years, 30 pack-year currently smoking OR have quit w/in 15years. Patient does not qualify. Breast:  Up to date on Mammogram? Yes  Up to date of Bone Density/Dexa? Yes Colorectal: Up to date-pt stated she will be seen within the next couple years  Additional Screenings:  Hepatitis B/HIV/Syphillis:Declined Hepatitis C Screening: Declined     Plan:    I have personally reviewed and addressed the Medicare Annual Wellness questionnaire and have noted the following in the patient's chart:  A. Medical and social history B. Use of alcohol, tobacco or illicit drugs  C. Current medications and supplements D. Functional ability and status E.  Nutritional status F.  Physical activity G. Advance directives H. List of other physicians I.  Hospitalizations, surgeries, and ER visits in previous 12 months J.  [place] to include hearing, vision, cognitive, depression L. Referrals and appointments - none  In addition, I have reviewed and discussed with patient certain preventive protocols, quality metrics, and best practice recommendations. A written personalized care plan for preventive services as well as general preventive health recommendations were provided to patient.  See attached scanned questionnaire for additional information.   Signed,   Rich Reining, RN Nurse Health Advisor   Quick Notes   Health Maintenance: Shingrix prescription sent to pharmacy     Abnormal Screen: MMSE 28/30. Did not pass clock  drawing. BP 152/80     Patient Concerns: None     Nurse Concerns: None

## 2017-02-24 ENCOUNTER — Encounter: Payer: Self-pay | Admitting: Internal Medicine

## 2017-02-24 DIAGNOSIS — S52521D Torus fracture of lower end of right radius, subsequent encounter for fracture with routine healing: Secondary | ICD-10-CM | POA: Insufficient documentation

## 2017-02-28 ENCOUNTER — Other Ambulatory Visit: Payer: Self-pay

## 2017-03-07 ENCOUNTER — Non-Acute Institutional Stay: Payer: Medicare Other | Admitting: Internal Medicine

## 2017-03-07 ENCOUNTER — Encounter: Payer: Self-pay | Admitting: Internal Medicine

## 2017-03-07 VITALS — BP 134/68 | HR 60 | Temp 97.8°F | Resp 16 | Ht 62.0 in | Wt 202.6 lb

## 2017-03-07 DIAGNOSIS — E785 Hyperlipidemia, unspecified: Secondary | ICD-10-CM

## 2017-03-07 DIAGNOSIS — M1A4410 Other secondary chronic gout, right hand, without tophus (tophi): Secondary | ICD-10-CM

## 2017-03-07 DIAGNOSIS — I119 Hypertensive heart disease without heart failure: Secondary | ICD-10-CM

## 2017-03-07 DIAGNOSIS — N183 Chronic kidney disease, stage 3 unspecified: Secondary | ICD-10-CM

## 2017-03-07 DIAGNOSIS — Z8719 Personal history of other diseases of the digestive system: Secondary | ICD-10-CM | POA: Diagnosis not present

## 2017-03-07 DIAGNOSIS — E1169 Type 2 diabetes mellitus with other specified complication: Secondary | ICD-10-CM | POA: Diagnosis not present

## 2017-03-07 DIAGNOSIS — E669 Obesity, unspecified: Secondary | ICD-10-CM | POA: Diagnosis not present

## 2017-03-07 DIAGNOSIS — G5603 Carpal tunnel syndrome, bilateral upper limbs: Secondary | ICD-10-CM

## 2017-03-07 MED ORDER — ALPRAZOLAM 0.25 MG PO TABS: 0.2500 mg | ORAL_TABLET | Freq: Two times a day (BID) | ORAL | 0 refills | Status: DC | PRN

## 2017-03-07 NOTE — Progress Notes (Signed)
Brittany Mcclure  Provider: Blanchie Serve MD   Location:  Brittany Mcclure:  [place] (12)  PCP: Blanchie Serve, MD Brittany Mcclure: Blanchie Serve, MD as PCP - General (Internal Medicine) Iran Planas, MD as Consulting Physician (Orthopedic Surgery) Marygrace Drought, MD as Consulting Physician (Ophthalmology)  Extended Emergency Contact Information Primary Emergency Contact: Brittany Mcclure,Brittany Mcclure  [place] Mobile Phone: [phone] Relation: Daughter Secondary Emergency Contact: Brittany Mcclure,Brittany Mcclure  [place] Mobile Phone: [phone] Relation: Relative  Code Status: DNR  Goals of Care: Advanced Directive information Advanced Directives 02/21/2017  Does Brittany Have a Medical Advance Directive? Yes  Type of Paramedic of [place];Out of facility DNR (pink MOST or yellow form)  Does Brittany want to make changes to medical advance directive? No - Brittany declined  Copy of [place] in Chart? Yes  Pre-existing out of facility DNR order (yellow form or pink MOST form) Yellow form placed in chart (order not valid for inpatient use);Pink MOST form placed in chart (order not valid for inpatient use)      Chief Complaint  Brittany presents with  . Medical Management of Chronic Issues    3 month follow up. Brittany stated that Brittany Mcclure is scheduled for surgery January 4th for carpal tunnel on one hand.   . Medication Refill    Xanxax     HPI: Brittany is a 76 y.o. female seen today for routine visit.   Carpal tunnel syndrome- pending surgery 03/24/17 with Dr Fredna Dow to right hand. Ongoing pain and numbness with swelling.   Gout- no recent flare up. Currently on allopurinol 200 mg daily  Chronic depression- taking fluoxetine 40 mg daily, also on alprazolam 0.25 mg bid prn and mostly takes one in am, needs refill.   Type 2 diabetes- taking metformin 500 mg daily. Denies  hypoglycemia. Blood sugar reading 140-160.   HTN- takes  Bisoprolol-hctz 10-6.25 mg daily, tolerating it well  Hyperlipidemia- allergic to statin. Takes fish oil  Past Medical History:  Diagnosis Date  . Allergy   . Anemia   . Anxiety   . Arthritis    with gout  . Carotid bruit    right  . Cataract    bil cataracts removed  . Chest pain   . Colon polyps   . Depression   . Diabetes type 2, uncontrolled ([place])   . Diverticulosis   . Exogenous obesity   . GERD (gastroesophageal reflux disease)   . Hyperlipidemia   . Hypertension   . Metabolic syndrome   . Osteopenia   . Seasonal allergies    seasonal  . Tubular adenoma of colon    Past Surgical History:  Procedure Laterality Date  . COLONOSCOPY     11/90,11/91,1994,2000,2010  . DILATION AND CURETTAGE OF UTERUS    . DILATION AND CURETTAGE OF UTERUS  1997  . FOOT SURGERY     x 2  . HAND SURGERY    . POLYPECTOMY    . TUBAL LIGATION    . uterine polyps removed     x2    reports that Brittany Mcclure has quit smoking. Brittany Mcclure has never used smokeless tobacco. Brittany Mcclure reports that Brittany Mcclure drinks alcohol. Brittany Mcclure reports that Brittany Mcclure does not use drugs. Social History   Socioeconomic History  . Marital status: Widowed    Spouse name: Not on file  . Number of children: 2  . Years of education: Not on file  .  Highest education level: Not on file  Social Needs  . Financial resource strain: Not hard at all  . Food insecurity - worry: Never true  . Food insecurity - inability: Never true  . Transportation needs - medical: No  . Transportation needs - non-medical: No  Occupational History  . Occupation: retired    Fish farm manager: RETIRED  Tobacco Use  . Smoking status: Former Research scientist (life sciences)  . Smokeless tobacco: Never Used  Substance and Sexual Activity  . Alcohol use: Yes    Alcohol/week: 0.0 oz    Comment: socially  . Drug use: No  . Sexual activity: Not on file  Other Topics Concern  . Not on file  Social History Narrative   Tobacco use, amount per  day now: None      Past tobacco use, amount per day: in her late 45s a pack per day      How many years did you use tobacco: 10      Alcohol use (drinks per week): occasional wine      Diet:      Do you drink/eat things with caffeine? Tea, soft drinks and coffee      Marital status: Married             What year were you married? 1962      Do you live in a house, apartment, assisted living, condo, trailer? Apartment      Is it one or more stories? Yes      How many persons live in your home? 0      Do you have any pets in your home? 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 Yes            How often? Water aerobics       Do you have a living will? Yes      Do you have a DNR form? Yes           If not, do you want to discuss one?      Do you have signed POA/HPOA forms?  Yes             Functional Status Survey:    Family History  Problem Relation Age of Onset  . Colon cancer Mother   . Stroke Father   . Hypertension Father   . Heart failure Maternal Grandmother   . Diabetes Maternal Grandmother        ?  Marland Kitchen Rectal cancer Neg Hx   . Stomach cancer Neg Hx   . Esophageal cancer Neg Hx   . Pancreatic cancer Neg Hx     Health Maintenance  Topic Date Due  . URINE MICROALBUMIN  02/21/2017  . COLONOSCOPY  04/02/2017  . MAMMOGRAM  04/11/2017  . HEMOGLOBIN A1C  05/22/2017  . FOOT EXAM  11/15/2017  . OPHTHALMOLOGY EXAM  02/06/2018  . TETANUS/TDAP  03/02/2025  . INFLUENZA VACCINE  Completed  . DEXA SCAN  Completed  . PNA vac Low Risk Adult  Completed    Allergies  Allergen Reactions  . Crestor [Rosuvastatin Calcium]     Myalgias   . Iodine     Skin rash  . Lipitor [Atorvastatin Calcium]     Myalgias   . Lovastatin     myalgias  . Sulfonamide Derivatives     REACTION: n\T\v  . Sulfur     Made infection worse  . Zetia [Ezetimibe]  Cough, dry    Outpatient Encounter Medications as of 03/07/2017  Medication Sig  .  allopurinol (ZYLOPRIM) 100 MG tablet TAKE 2 TABLETS DAILY.  Marland Kitchen ALPRAZolam (XANAX) 0.25 MG tablet Take 1 tablet (0.25 mg total) by mouth 2 (two) times daily as needed for anxiety.  . bisoprolol-hydrochlorothiazide (ZIAC) 10-6.25 MG tablet Take 1 tablet by mouth daily.  . Calcium Carbonate-Vit D-Min (CALCIUM 1200 PO) Take 1 tablet by mouth daily.  . clobetasol cream (TEMOVATE) 0.10 % Apply 1 application topically as needed.   Marland Kitchen FLUoxetine (PROZAC) 40 MG capsule Take one capsule by mouth once daily  . glucose blood (ACCU-CHEK SMARTVIEW) test strip Use to check blood sugar three times a day and PRN,  Dx code: E11.65  . Lancets (ACCU-CHEK SOFT TOUCH) lancets Use as instructed  . metFORMIN (GLUCOPHAGE) 500 MG tablet TAKE 1 TABLET(500 MG) BY MOUTH DAILY WITH BREAKFAST  . Omega-3 Fatty Acids (FISH OIL) 1000 MG CAPS Take 1 capsule by mouth daily.   . Ferrous Sulfate (IRON) 325 (65 Fe) MG TABS Take 1 tablet by mouth daily.   . Flaxseed, Linseed, (FLAXSEED OIL PO) Take 1 tablet by mouth daily.   No facility-administered encounter medications on file as of 03/07/2017.     Review of Systems  Constitutional: Negative for appetite change, fatigue and fever.  HENT: Negative for congestion, hearing loss, mouth sores and trouble swallowing.   Respiratory: Negative for cough and shortness of breath.   Cardiovascular: Positive for leg swelling. Negative for chest pain and palpitations.  Gastrointestinal: Negative for abdominal pain, blood in stool, constipation, nausea and vomiting.       Has occassional reflux symptom  Genitourinary: Negative for dysuria.  Musculoskeletal: Positive for arthralgias. Negative for back pain and gait problem.       No falls reported  Skin: Negative for rash.  Neurological: Positive for numbness. Negative for dizziness, seizures and headaches.  Hematological: Does not bruise/bleed easily.  Psychiatric/Behavioral: Negative for behavioral problems. The Brittany is nervous/anxious.      Vitals:   03/07/17 1128  BP: 134/68  Pulse: 60  Resp: 16  Temp: 97.8 F (36.6 C)  TempSrc: Oral  SpO2: 93%  Weight: 202 lb 9.6 oz (91.9 kg)  Height: '5\' 2"'  (1.575 m)   Body mass index is 37.06 kg/m.   Wt Readings from Last 3 Encounters:  03/07/17 202 lb 9.6 oz (91.9 kg)  02/21/17 201 lb (91.2 kg)  12/06/16 201 lb (91.2 kg)   Physical Exam  Constitutional: Brittany Mcclure is oriented to person, place, and time. Brittany Mcclure appears well-developed. No distress.  obese  HENT:  Head: Normocephalic and atraumatic.  Mouth/Throat: Oropharynx is clear and moist.  Eyes: Conjunctivae and EOM are normal. Pupils are equal, round, and reactive to light. Right eye exhibits no discharge. Left eye exhibits no discharge.  Neck: Normal range of motion. Neck supple.  Cardiovascular: Normal rate and regular rhythm.  Pulmonary/Chest: Effort normal and breath sounds normal.  Abdominal: Soft. Bowel sounds are normal. There is no tenderness.  Musculoskeletal: Brittany Mcclure exhibits no edema.  Swelling to both hands, able to move all 4 extremities  Lymphadenopathy:    Brittany Mcclure has no cervical adenopathy.  Neurological: Brittany Mcclure is alert and oriented to person, place, and time.  Skin: Skin is warm and dry. No rash noted. Brittany Mcclure is not diaphoretic.  Psychiatric: Brittany Mcclure has a normal mood and affect.    Labs reviewed: Basic Metabolic Panel: Recent Labs    11/22/16 0740  NA 136  K  4.6  CL 102  CO2 26  GLUCOSE 109*  BUN 22  CREATININE 1.00*  CALCIUM 9.0   Liver Function Tests: Recent Labs    11/22/16 0740  AST 16  ALT 13  ALKPHOS 83  BILITOT 0.6  PROT 6.4  ALBUMIN 4.0   No results for input(s): LIPASE, AMYLASE in the last 8760 hours. No results for input(s): AMMONIA in the last 8760 hours. CBC: Recent Labs    05/27/16 1118 11/22/16 0740  WBC 9.6 9.3  NEUTROABS 6.6 6,324  HGB 12.7 12.3  HCT 38.3 35.8  MCV 93.4 88.4  PLT 271.0 266   Cardiac Enzymes: No results for input(s): CKTOTAL, CKMB, CKMBINDEX, TROPONINI  in the last 8760 hours. BNP: Invalid input(s): POCBNP Lab Results  Component Value Date   HGBA1C 6.2 (H) 11/22/2016   Lab Results  Component Value Date   TSH 2.01 11/22/2016   No results found for: VITAMINB12 No results found for: FOLATE Lab Results  Component Value Date   IRON 60 02/03/2017   FERRITIN 72.0 02/03/2017    Lipid Panel: Recent Labs    11/22/16 0740  CHOL 251*  HDL 46*  LDLCALC 163*  TRIG 208*  CHOLHDL 5.5*   Lab Results  Component Value Date   HGBA1C 6.2 (H) 11/22/2016    Procedures since last visit: No results found.  Assessment/Plan  1. Benign hypertensive heart disease without heart failure Continue bisoprolol with HCTZ and monitor.  2. Diabetes mellitus type 2 in obese ([place]) Controlled. Continue metformin 500 mg daily - Hemoglobin A1c; Future - Microalbumin/Creatinine Ratio, Urine; Future  3. CKD (chronic kidney disease) stage 3, GFR 30-59 ml/min (HCC) Continue antihypertensive - CMP with eGFR; Future - Hemoglobin A1c; Future - Microalbumin/Creatinine Ratio, Urine; Future  4. Dyslipidemia Reviewed lipid panel. Continue fish oil - Hemoglobin A1c; Future  5. 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 Bilateral carpal tunnel syndrome Pending surgery 03/24/17  7. History of GI bleed Avoid NSAIDs    Labs/tests ordered:  As above  Next appointment:6 months   Blanchie Serve, MD Internal Medicine [facility] [address] [place], [place] [address] Cell Phone (Monday-Friday 8 am - 5 pm): [phone] On Call: [phone] and follow prompts after 5 pm and on weekends Office Phone: [phone] Office Fax: [phone]

## 2017-03-30 LAB — HEMOGLOBIN A1C
HEMOGLOBIN A1C: 6.5 %{Hb} — AB (ref <5.7)
Mean Plasma Glucose: 140 (calc)
eAG (mmol/L): 7.7 (calc)

## 2017-03-30 LAB — COMPLETE METABOLIC PANEL WITH GFR
AG Ratio: 1.8 (calc) (ref 1.0–2.5)
ALKALINE PHOSPHATASE (APISO): 80 U/L (ref 33–130)
ALT: 13 U/L (ref 6–29)
AST: 16 U/L (ref 10–35)
Albumin: 4.1 g/dL (ref 3.6–5.1)
BILIRUBIN TOTAL: 0.5 mg/dL (ref 0.2–1.2)
BUN/Creatinine Ratio: 20 (calc) (ref 6–22)
BUN: 26 mg/dL — ABNORMAL HIGH (ref 7–25)
CHLORIDE: 97 mmol/L — AB (ref 98–110)
CO2: 30 mmol/L (ref 20–32)
Calcium: 9.7 mg/dL (ref 8.6–10.4)
Creat: 1.33 mg/dL — ABNORMAL HIGH (ref 0.60–0.93)
GFR, Est African American: 44 mL/min/{1.73_m2} — ABNORMAL LOW (ref >=60)
GFR, Est Non African American: 38 mL/min/{1.73_m2} — ABNORMAL LOW (ref >=60)
GLUCOSE: 93 mg/dL (ref 65–99)
Globulin: 2.3 g/dL (calc) (ref 1.9–3.7)
Potassium: 4.6 mmol/L (ref 3.5–5.3)
Sodium: 134 mmol/L — ABNORMAL LOW (ref 135–146)
TOTAL PROTEIN: 6.4 g/dL (ref 6.1–8.1)

## 2017-03-30 LAB — MICROALBUMIN / CREATININE URINE RATIO

## 2017-03-31 ENCOUNTER — Other Ambulatory Visit: Payer: Self-pay | Admitting: *Deleted

## 2017-03-31 DIAGNOSIS — N289 Disorder of kidney and ureter, unspecified: Secondary | ICD-10-CM

## 2017-04-04 ENCOUNTER — Other Ambulatory Visit: Payer: Self-pay

## 2017-04-04 DIAGNOSIS — E669 Obesity, unspecified: Principal | ICD-10-CM

## 2017-04-04 DIAGNOSIS — E1169 Type 2 diabetes mellitus with other specified complication: Secondary | ICD-10-CM

## 2017-04-13 LAB — MICROALBUMIN / CREATININE URINE RATIO

## 2017-04-13 LAB — BASIC METABOLIC PANEL
BUN/Creatinine Ratio: 23 (calc) — ABNORMAL HIGH (ref 6–22)
BUN: 26 mg/dL — AB (ref 7–25)
CALCIUM: 9.5 mg/dL (ref 8.6–10.4)
CO2: 27 mmol/L (ref 20–32)
CREATININE: 1.15 mg/dL — AB (ref 0.60–0.93)
Chloride: 100 mmol/L (ref 98–110)
GLUCOSE: 118 mg/dL — AB (ref 65–99)
Potassium: 4.5 mmol/L (ref 3.5–5.3)
Sodium: 137 mmol/L (ref 135–146)

## 2017-04-21 ENCOUNTER — Other Ambulatory Visit: Payer: Self-pay | Admitting: Internal Medicine

## 2017-04-24 ENCOUNTER — Other Ambulatory Visit: Payer: Self-pay

## 2017-04-24 DIAGNOSIS — N183 Chronic kidney disease, stage 3 unspecified: Secondary | ICD-10-CM

## 2017-04-24 DIAGNOSIS — E1169 Type 2 diabetes mellitus with other specified complication: Secondary | ICD-10-CM

## 2017-04-24 DIAGNOSIS — D509 Iron deficiency anemia, unspecified: Secondary | ICD-10-CM

## 2017-04-24 DIAGNOSIS — E669 Obesity, unspecified: Principal | ICD-10-CM

## 2017-04-25 ENCOUNTER — Encounter: Payer: Self-pay | Admitting: Internal Medicine

## 2017-04-26 LAB — MICROALBUMIN / CREATININE URINE RATIO
CREATININE, URINE: 75 mg/dL (ref 20–275)
Microalb Creat Ratio: 9 mcg/mg creat (ref <30)
Microalb, Ur: 0.7 mg/dL

## 2017-06-01 ENCOUNTER — Other Ambulatory Visit: Payer: Self-pay | Admitting: Internal Medicine

## 2017-06-01 DIAGNOSIS — Z139 Encounter for screening, unspecified: Secondary | ICD-10-CM

## 2017-06-20 ENCOUNTER — Ambulatory Visit: Payer: Medicare Other

## 2017-06-20 ENCOUNTER — Ambulatory Visit
Admission: RE | Admit: 2017-06-20 | Discharge: 2017-06-20 | Disposition: A | Discharge status: Home / Self Care | Payer: Medicare Other | Source: Ambulatory Visit | Attending: Internal Medicine | Admitting: Internal Medicine

## 2017-06-20 DIAGNOSIS — Z139 Encounter for screening, unspecified: Secondary | ICD-10-CM

## 2017-06-29 ENCOUNTER — Other Ambulatory Visit: Payer: Self-pay | Admitting: Internal Medicine

## 2017-09-05 ENCOUNTER — Encounter: Payer: Self-pay | Admitting: Internal Medicine

## 2017-10-09 ENCOUNTER — Ambulatory Visit: Payer: Medicare Other | Admitting: Internal Medicine

## 2017-10-10 ENCOUNTER — Encounter: Payer: Medicare Other | Admitting: Internal Medicine

## 2017-10-11 ENCOUNTER — Other Ambulatory Visit: Payer: Self-pay | Admitting: *Deleted

## 2017-10-11 ENCOUNTER — Other Ambulatory Visit: Payer: Self-pay

## 2017-10-11 ENCOUNTER — Telehealth: Payer: Self-pay | Admitting: *Deleted

## 2017-10-11 ENCOUNTER — Encounter: Payer: Medicare Other | Admitting: Internal Medicine

## 2017-10-11 DIAGNOSIS — N183 Chronic kidney disease, stage 3 unspecified: Secondary | ICD-10-CM

## 2017-10-11 DIAGNOSIS — E118 Type 2 diabetes mellitus with unspecified complications: Secondary | ICD-10-CM

## 2017-10-11 NOTE — Telephone Encounter (Signed)
Spoke with Riki Altes at [facility]. She was calling about patient and her appt. Patient was under the impression that her appt with Dr. Bubba Camp was today, when it was actually yesterday. I offered to schedule another appt for 10/17/17 which was the next available clinic time at [facility] with Dr. Bubba Camp. Patient refused to reschedule at this time.

## 2017-10-16 ENCOUNTER — Other Ambulatory Visit: Payer: Self-pay | Admitting: Internal Medicine

## 2017-10-17 ENCOUNTER — Non-Acute Institutional Stay: Payer: Medicare Other | Admitting: Internal Medicine

## 2017-10-17 ENCOUNTER — Encounter: Payer: Self-pay | Admitting: Internal Medicine

## 2017-10-17 VITALS — BP 134/72 | HR 60 | Temp 98.2°F | Resp 18 | Ht 62.0 in | Wt 204.0 lb

## 2017-10-17 DIAGNOSIS — E785 Hyperlipidemia, unspecified: Secondary | ICD-10-CM

## 2017-10-17 DIAGNOSIS — N183 Chronic kidney disease, stage 3 unspecified: Secondary | ICD-10-CM

## 2017-10-17 DIAGNOSIS — E669 Obesity, unspecified: Secondary | ICD-10-CM

## 2017-10-17 DIAGNOSIS — E1169 Type 2 diabetes mellitus with other specified complication: Secondary | ICD-10-CM | POA: Diagnosis not present

## 2017-10-17 DIAGNOSIS — L089 Local infection of the skin and subcutaneous tissue, unspecified: Secondary | ICD-10-CM

## 2017-10-17 DIAGNOSIS — M1A4410 Other secondary chronic gout, right hand, without tophus (tophi): Secondary | ICD-10-CM | POA: Diagnosis not present

## 2017-10-17 DIAGNOSIS — L729 Follicular cyst of the skin and subcutaneous tissue, unspecified: Secondary | ICD-10-CM

## 2017-10-17 DIAGNOSIS — I119 Hypertensive heart disease without heart failure: Secondary | ICD-10-CM

## 2017-10-17 LAB — TEST AUTHORIZATION

## 2017-10-17 LAB — LIPID PANEL
CHOL/HDL RATIO: 5.1 (calc) — AB (ref <5.0)
CHOLESTEROL: 254 mg/dL — AB (ref <200)
HDL: 50 mg/dL — AB (ref >50)
LDL CHOLESTEROL (CALC): 164 mg/dL — AB
Non-HDL Cholesterol (Calc): 204 mg/dL (calc) — ABNORMAL HIGH (ref <130)
TRIGLYCERIDES: 235 mg/dL — AB (ref <150)

## 2017-10-17 LAB — COMPREHENSIVE METABOLIC PANEL
AG Ratio: 1.6 (calc) (ref 1.0–2.5)
ALT: 15 U/L (ref 6–29)
AST: 17 U/L (ref 10–35)
Albumin: 4 g/dL (ref 3.6–5.1)
Alkaline phosphatase (APISO): 90 U/L (ref 33–130)
BILIRUBIN TOTAL: 0.6 mg/dL (ref 0.2–1.2)
BUN / CREAT RATIO: 25 (calc) — AB (ref 6–22)
BUN: 29 mg/dL — AB (ref 7–25)
CALCIUM: 9.3 mg/dL (ref 8.6–10.4)
CO2: 26 mmol/L (ref 20–32)
Chloride: 100 mmol/L (ref 98–110)
Creat: 1.15 mg/dL — ABNORMAL HIGH (ref 0.60–0.93)
Globulin: 2.5 g/dL (calc) (ref 1.9–3.7)
Glucose, Bld: 125 mg/dL — ABNORMAL HIGH (ref 65–99)
POTASSIUM: 4.7 mmol/L (ref 3.5–5.3)
Sodium: 135 mmol/L (ref 135–146)
TOTAL PROTEIN: 6.5 g/dL (ref 6.1–8.1)

## 2017-10-17 LAB — MICROALBUMIN / CREATININE URINE RATIO

## 2017-10-17 LAB — HEMOGLOBIN A1C
HEMOGLOBIN A1C: 7.1 %{Hb} — AB (ref <5.7)
Mean Plasma Glucose: 157 (calc)
eAG (mmol/L): 8.7 (calc)

## 2017-10-17 MED ORDER — METFORMIN HCL 500 MG PO TABS: 500.0000 mg | ORAL_TABLET | Freq: Two times a day (BID) | ORAL | 3 refills | Status: DC

## 2017-10-17 MED ORDER — GEMFIBROZIL 600 MG PO TABS: 600.0000 mg | ORAL_TABLET | Freq: Two times a day (BID) | ORAL | 3 refills | Status: DC

## 2017-10-17 MED ORDER — CEPHALEXIN 500 MG PO CAPS: 500.0000 mg | ORAL_CAPSULE | Freq: Two times a day (BID) | ORAL | 0 refills | Status: DC

## 2017-10-17 MED ORDER — ACCU-CHEK SOFT TOUCH LANCETS MISC: 12 refills | Status: DC

## 2017-10-17 MED ORDER — SACCHAROMYCES BOULARDII 250 MG PO CAPS: 250.0000 mg | ORAL_CAPSULE | Freq: Two times a day (BID) | ORAL | 0 refills | Status: DC

## 2017-10-17 MED ORDER — GLUCOSE BLOOD VI STRP: ORAL_STRIP | 12 refills | Status: DC

## 2017-10-17 NOTE — Progress Notes (Signed)
[facility]  Provider: Blanchie Serve MD   Location:      Place of Service:     PCP: Blanchie Serve, MD Patient Care Team: Blanchie Serve, MD as PCP - General (Internal Medicine) Iran Planas, MD as Consulting Physician (Orthopedic Surgery) Marygrace Drought, MD as Consulting Physician (Ophthalmology)  Extended Emergency Contact Information Primary Emergency Contact: Myers,Sharon  [place] Mobile Phone: [phone] Relation: Daughter Secondary Emergency Contact: Myers,Randall  [place] Mobile Phone: [phone] Relation: Relative   Goals of Care: Advanced Directive information Advanced Directives 10/17/2017  Does Patient Have a Medical Advance Directive? Yes  Type of Paramedic of [place];Living will;Out of facility DNR (pink MOST or yellow form)  Does patient want to make changes to medical advance directive? No - Patient declined  Copy of [place] in Chart? Yes  Pre-existing out of facility DNR order (yellow form or pink MOST form) Yellow form placed in chart (order not valid for inpatient use)      Chief Complaint  Patient presents with  . Medical Management of Chronic Issues    6 months follow up  . Medication Refill    No refills needed at this time    HPI: Patient is a 77 y.o. female seen today for routine visit. She complaints of a lump to her vaginal area, she has noticed some blood in her underwear and has applied neosporin to the lesion area. She complaints of it being tender to touch. It has been there for few days. Calcium supplement brings along indigestion and she has stopped taking it. Does not want to take calcium anymore.  Gout- taking allopurinol 200 mg daily. Denies any recent gout attack  Type 2 diabetes with obesity- takes metformin 500 mg daily  Hypertension- takes ziac 10-6.25 mg daily, tolerates it well, denies chest pain, dyspnea or headache.     Depression- takes fluoxetine 40 mg daily. Mood has been stable.     Past Medical History:  Diagnosis Date  . Allergy   . Anemia   . Anxiety   . Arthritis    with gout  . Carotid bruit    right  . Cataract    bil cataracts removed  . Chest pain   . Colon polyps   . Depression   . Diabetes type 2, uncontrolled ([place])   . Diverticulosis   . Exogenous obesity   . GERD (gastroesophageal reflux disease)   . Hyperlipidemia   . Hypertension   . Metabolic syndrome   . Osteopenia   . Seasonal allergies    seasonal  . Tubular adenoma of colon    Past Surgical History:  Procedure Laterality Date  . COLONOSCOPY     11/90,11/91,1994,2000,2010  . DILATION AND CURETTAGE OF UTERUS    . DILATION AND CURETTAGE OF UTERUS  1997  . FOOT SURGERY     x 2  . HAND SURGERY    . POLYPECTOMY    . TUBAL LIGATION    . uterine polyps removed     x2    reports that she has quit smoking. She has never used smokeless tobacco. She reports that she drinks alcohol. She reports that she does not use drugs. Social History   Socioeconomic History  . Marital status: Widowed    Spouse name: Not on file  . Number of children: 2  . Years of education: Not on file  . Highest education level: Not on file  Occupational History  . Occupation: retired    Fish farm manager: RETIRED  Social Needs  . Financial resource strain: Not hard at all  . Food insecurity:    Worry: Never true    Inability: Never true  . Transportation needs:    Medical: No    Non-medical: No  Tobacco Use  . Smoking status: Former Research scientist (life sciences)  . Smokeless tobacco: Never Used  Substance and Sexual Activity  . Alcohol use: Yes    Alcohol/week: 0.0 oz    Comment: socially  . Drug use: No  . Sexual activity: Not on file  Lifestyle  . Physical activity:    Days per week: 3 days    Minutes per session: 30 min  . Stress: Only a little  Relationships  . Social connections:    Talks on phone: Not on file    Gets together: More than  three times a week    Attends religious service: 1 to 4 times per year    Active member of club or organization: Yes    Attends meetings of clubs or organizations: 1 to 4 times per year    Relationship status: Widowed  . Intimate partner violence:    Fear of current or ex partner: No    Emotionally abused: No    Physically abused: No    Forced sexual activity: No  Other Topics Concern  . Not on file  Social History Narrative   Tobacco use, amount per day now: None      Past tobacco use, amount per day: in her late 32s a pack per day      How many years did you use tobacco: 10      Alcohol use (drinks per week): occasional wine      Diet:      Do you drink/eat things with caffeine? Tea, soft drinks and coffee      Marital status: Married             What year were you married? 1962      Do you live in a house, apartment, assisted living, condo, trailer? Apartment      Is it one or more stories? Yes      How many persons live in your home? 0      Do you have any pets in your home? 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 Yes            How often? Water aerobics       Do you have a living will? Yes      Do you have a DNR form? Yes           If not, do you want to discuss one?      Do you have signed POA/HPOA forms?  Yes             Functional Status Survey:    Family History  Problem Relation Age of Onset  . Colon cancer Mother   . Stroke Father   . Hypertension Father   . Heart failure Maternal Grandmother   . Diabetes Maternal Grandmother        ?  Marland Kitchen Rectal cancer Neg Hx   . Stomach cancer Neg Hx   . Esophageal cancer Neg Hx   . Pancreatic cancer Neg Hx     Health Maintenance  Topic Date Due  . COLONOSCOPY  04/02/2017  .  INFLUENZA VACCINE  10/19/2017  . FOOT EXAM  11/15/2017  . OPHTHALMOLOGY EXAM  02/06/2018  . HEMOGLOBIN A1C  04/14/2018  . MAMMOGRAM  06/21/2018  . URINE MICROALBUMIN  10/13/2018  . TETANUS/TDAP   03/02/2025  . DEXA SCAN  Completed  . PNA vac Low Risk Adult  Completed    Allergies  Allergen Reactions  . Crestor [Rosuvastatin Calcium]     Myalgias   . Iodine     Skin rash  . Lipitor [Atorvastatin Calcium]     Myalgias   . Lovastatin     myalgias  . Sulfonamide Derivatives     REACTION: n\T\v  . Sulfur     Made infection worse  . Zetia [Ezetimibe]     Cough, dry    Outpatient Encounter Medications as of 10/17/2017  Medication Sig  . allopurinol (ZYLOPRIM) 100 MG tablet TAKE 2 TABLETS BY MOUTH ONCE DAILY.  Marland Kitchen ALPRAZolam (XANAX) 0.25 MG tablet TAKE (1) TABLET TWICE A DAY AS NEEDED.  . bisoprolol-hydrochlorothiazide (ZIAC) 10-6.25 MG tablet Take 1 tablet by mouth daily.  . clobetasol cream (TEMOVATE) 4.16 % Apply 1 application topically as needed.   . Flaxseed, Linseed, (FLAXSEED OIL PO) Take 1 tablet by mouth daily.  Marland Kitchen FLUoxetine (PROZAC) 40 MG capsule TAKE (1) CAPSULE DAILY.  Marland Kitchen glucose blood (ACCU-CHEK SMARTVIEW) test strip Use to check blood sugar three times a day and PRN,  Dx code: E11.65  . Lancets (ACCU-CHEK SOFT TOUCH) lancets Use as instructed  . metFORMIN (GLUCOPHAGE) 500 MG tablet TAKE 1 TABLET(500 MG) BY MOUTH DAILY WITH BREAKFAST  . Omega-3 Fatty Acids (FISH OIL) 1000 MG CAPS Take 1 capsule by mouth daily.   . Calcium Carbonate-Vit D-Min (CALCIUM 1200 PO) Take 1 tablet by mouth daily.   No facility-administered encounter medications on file as of 10/17/2017.     Review of Systems  Constitutional: Negative for appetite change, chills, diaphoresis and fever.  HENT: Negative for congestion, mouth sores and trouble swallowing.   Eyes: Negative for pain and redness.  Respiratory: Negative for cough, shortness of breath and wheezing.   Cardiovascular: Negative for chest pain and palpitations.  Gastrointestinal: Negative for abdominal pain, diarrhea, nausea and vomiting.  Genitourinary: Negative for dysuria.  Musculoskeletal: Positive for gait problem.  Skin:  Negative for rash.  Neurological: Negative for seizures, weakness and numbness.  Psychiatric/Behavioral: Negative for behavioral problems and confusion.    Vitals:   10/17/17 1138  BP: 134/72  Pulse: 60  Resp: 18  Temp: 98.2 F (36.8 C)  TempSrc: Oral  SpO2: 96%  Weight: 204 lb (92.5 kg)  Height: 5' 2" (1.575 m)   Body mass index is 37.31 kg/m.   Wt Readings from Last 3 Encounters:  10/17/17 204 lb (92.5 kg)  03/07/17 202 lb 9.6 oz (91.9 kg)  02/21/17 201 lb (91.2 kg)   Physical Exam  Constitutional: She is oriented to person, place, and time. No distress.  Obese elderly female  HENT:  Head: Normocephalic and atraumatic.  Mouth/Throat: Oropharynx is clear and moist.  Eyes: Pupils are equal, round, and reactive to light. Conjunctivae and EOM are normal. Right eye exhibits no discharge. Left eye exhibits no discharge.  Neck: Normal range of motion. Neck supple.  Cardiovascular: Normal rate and regular rhythm.  Pulmonary/Chest: Effort normal and breath sounds normal. No respiratory distress. She has no wheezes. She has no rales.  Abdominal: Soft. Bowel sounds are normal. There is no tenderness. There is no guarding.  Musculoskeletal: She exhibits edema.  Trace leg edema, can move all 4 extremities  Lymphadenopathy:    She has no cervical adenopathy.  Neurological: She is alert and oriented to person, place, and time.  Skin: Skin is warm and dry. She is not diaphoretic.  Raised palpable cyst to right groin fold, tender to touch, reddish purple, fluctuant, serosangineous drainage, no palpable enlarged lymph node in inguinal area    Labs reviewed: Basic Metabolic Panel: Recent Labs    03/28/17 0705 04/10/17 0000 10/12/17 0000  NA 134* 137 135  K 4.6 4.5 4.7  CL 97* 100 100  CO2 _0 GLUCOSE 93 118* 125*  BUN 26* 26* 29*  CREATININE 1.33* 1.15* 1.15*  CALCIUM 9.7 9.5 9.3   Liver Function Tests: Recent Labs    11/22/16 0740 03/28/17 0705 10/12/17 0000    AST _1 ALT _2 ALKPHOS 83  --   --   BILITOT 0.6 0.5 0.6  PROT 6.4 6.4 6.5  ALBUMIN 4.0  --   --    No results for input(s): LIPASE, AMYLASE in the last 8760 hours. No results for input(s): AMMONIA in the last 8760 hours. CBC: Recent Labs    11/22/16 0740  WBC 9.3  NEUTROABS 6,324  HGB 12.3  HCT 35.8  MCV 88.4  PLT 266   Cardiac Enzymes: No results for input(s): CKTOTAL, CKMB, CKMBINDEX, TROPONINI in the last 8760 hours. BNP: Invalid input(s): POCBNP Lab Results  Component Value Date   HGBA1C 7.1 (H) 10/12/2017   Lab Results  Component Value Date   TSH 2.01 11/22/2016   No results found for: VITAMINB12 No results found for: FOLATE Lab Results  Component Value Date   IRON 60 02/03/2017   FERRITIN 72.0 02/03/2017    Lipid Panel: Recent Labs    11/22/16 0740  CHOL 251*  HDL 46*  LDLCALC 163*  TRIG 208*  CHOLHDL 5.5*   Lab Results  Component Value Date   HGBA1C 7.1 (H) 10/12/2017    Procedures since last visit: No results found.  Assessment/Plan  1. Diabetes mellitus type 2 in obese ([place]) a1c suggestive of poor sugar control. Increase metformin from 500 mg daily to bid. Advised not to skip meal but to have portion control. Cut down on sweets and fried food.  - glucose blood (ACCU-CHEK SMARTVIEW) test strip; Use to check blood sugar three times a day and PRN,  Dx code: E11.65  Dispense: 100 each; Refill: 12 - Lancets (ACCU-CHEK SOFT TOUCH) lancets; Use as instructed  Dispense: 100 each; Refill: 12 - metFORMIN (GLUCOPHAGE) 500 MG tablet; Take 1 tablet (500 mg total) by mouth 2 (two) times daily with a meal. TAKE 1 TABLET(500 MG) BY MOUTH DAILY WITH BREAKFAST  Dispense: 180 tablet; Refill: 3 - CMP with eGFR([facility]); Future - Hemoglobin A1c; Future  2. 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 Dyslipidemia Start gemfibrozil, allergic to statin. Continue fish oil - gemfibrozil (LOPID)  600 MG tablet; Take 1 tablet (600 mg total) by mouth 2 (two) times daily before a meal.  Dispense: 180 tablet; Refill: 3  4. CKD (chronic kidney disease) stage 3, GFR 30-59 ml/min ([facility]) Monitor bmp periodically, maintain hydration, avoid NSAIDs. - CMP with eGFR([facility]); Future  5. Benign hypertensive heart disease without heart failure Reviewed CMP. Continue bisoprolol hydrochlorothiazide and monitor - CMP with eGFR([facility]); Future  6. Infected cyst of skin - cephALEXin (KEFLEX) 500 MG capsule; Take 1 capsule (500  mg total) by mouth 2 (two) times daily.  Dispense: 14 capsule; Refill: 0 - saccharomyces boulardii (FLORASTOR) 250 MG capsule; Take 1 capsule (250 mg total) by mouth 2 (two) times daily.  Dispense: 28 capsule; Refill: 0 Reassess if no improvement    Labs/tests ordered:   Lab Orders     CMP with eGFR([facility])     Hemoglobin A1c   Next appointment: 3 months    Communication: reviewed care plan with patient     Blanchie Serve, MD Internal Medicine [address], [place] [address] Cell Phone (Monday-Friday 8 am - 5 pm): [phone] On Call: [phone] and follow prompts after 5 pm and on weekends Office Phone: [phone] Office Fax: [phone]

## 2017-10-17 NOTE — Patient Instructions (Signed)
  Take keflex twice a day for your infection for 1 week. If you have pain, further drainage or increase in sie of lesion, notify us.   I want you to take medication to help lower your cholesterol. Take it twice a day with your meals.   I have also increased your diabetes medication metformin to 500 mg twice a day with meals.

## 2017-10-19 ENCOUNTER — Telehealth: Payer: Self-pay | Admitting: *Deleted

## 2017-10-19 DIAGNOSIS — E669 Obesity, unspecified: Principal | ICD-10-CM

## 2017-10-19 DIAGNOSIS — E1169 Type 2 diabetes mellitus with other specified complication: Secondary | ICD-10-CM

## 2017-10-19 MED ORDER — METFORMIN HCL 500 MG PO TABS: 500.0000 mg | ORAL_TABLET | Freq: Two times a day (BID) | ORAL | 3 refills | Status: DC

## 2017-10-19 NOTE — Telephone Encounter (Signed)
I want her to take metformin 500 mg twice a day for now.

## 2017-10-19 NOTE — Telephone Encounter (Signed)
Please clarify Metformin instructions for [facility].  Office note lists it as 1 BID and then in the same place it says 1 QD with breakfast.

## 2017-10-19 NOTE — Telephone Encounter (Signed)
Pharmacy notified.

## 2017-11-01 ENCOUNTER — Encounter: Payer: Self-pay | Admitting: Internal Medicine

## 2017-11-14 ENCOUNTER — Other Ambulatory Visit: Payer: Self-pay

## 2017-11-14 DIAGNOSIS — E669 Obesity, unspecified: Principal | ICD-10-CM

## 2017-11-14 DIAGNOSIS — E1169 Type 2 diabetes mellitus with other specified complication: Secondary | ICD-10-CM

## 2017-11-14 DIAGNOSIS — N183 Chronic kidney disease, stage 3 unspecified: Secondary | ICD-10-CM

## 2017-11-14 DIAGNOSIS — I119 Hypertensive heart disease without heart failure: Secondary | ICD-10-CM

## 2017-11-30 ENCOUNTER — Non-Acute Institutional Stay: Payer: Medicare Other | Admitting: Nurse Practitioner

## 2017-11-30 ENCOUNTER — Encounter: Payer: Self-pay | Admitting: Nurse Practitioner

## 2017-11-30 DIAGNOSIS — R350 Frequency of micturition: Secondary | ICD-10-CM | POA: Diagnosis not present

## 2017-11-30 DIAGNOSIS — R300 Dysuria: Secondary | ICD-10-CM | POA: Diagnosis not present

## 2017-11-30 DIAGNOSIS — N183 Chronic kidney disease, stage 3 unspecified: Secondary | ICD-10-CM

## 2017-11-30 DIAGNOSIS — E669 Obesity, unspecified: Secondary | ICD-10-CM

## 2017-11-30 DIAGNOSIS — E785 Hyperlipidemia, unspecified: Secondary | ICD-10-CM

## 2017-11-30 DIAGNOSIS — R3915 Urgency of urination: Secondary | ICD-10-CM | POA: Diagnosis not present

## 2017-11-30 DIAGNOSIS — E1169 Type 2 diabetes mellitus with other specified complication: Secondary | ICD-10-CM

## 2017-11-30 NOTE — Assessment & Plan Note (Signed)
Urinary urgency x 2-3 days, UA C/S stat to r/i UTI

## 2017-11-30 NOTE — Assessment & Plan Note (Signed)
Last creat 1.15 09/2017, 03/2017. Will update CMP

## 2017-11-30 NOTE — Patient Instructions (Addendum)
F/u in clinic 6 months(cancel 01/09/18), update lipid panel, CMP with eGFR, Hgb a1c, CBC, TSH prior to the next appointment. UA C/S stat

## 2017-11-30 NOTE — Assessment & Plan Note (Signed)
Dysuria and lower abd discomfort x 2-3 days, will obtain UA C/S to R/I UTI.

## 2017-11-30 NOTE — Progress Notes (Signed)
Location:   [facility]   Place of Service:  Clinic (12) Provider: Marlana Latus NP  Code Status: DNR Goals of Care: [place] Advanced Directives 10/17/2017  Does Patient Have a Medical Advance Directive? Yes  Type of Paramedic of [place];Living will;Out of facility DNR (pink MOST or yellow form)  Does patient want to make changes to medical advance directive? No - Patient declined  Copy of [place] in Chart? Yes  Pre-existing out of facility DNR order (yellow form or pink MOST form) Yellow form placed in chart (order not valid for inpatient use)     Chief Complaint  Patient presents with  . Acute Visit    ? UTI x 3, c/o urinary freq,     HPI: Patient is a 77 y.o. female seen today for an acute visit for urinary frequency, urgency, dysuria, and lower abd discomfort for 2-3 days. She denied nausea, vomiting, diarrhea, constipation. She is afebrile.   She has questions regarding her hyperlipidemia, unable to tolerate statins, wants to f/u lipid panel. She voiced concerns of her CKD, her questions answered to the best of my knowledge. T2DM, last Hgb a1c 7.1, she monitors her CBG bid, questions about her may be low thyroid function since she has difficulty with weight reduction.   Past Medical History:  Diagnosis Date  . Allergy   . Anemia   . Anxiety   . Arthritis    with gout  . Carotid bruit    right  . Cataract    bil cataracts removed  . Chest pain   . Colon polyps   . Depression   . Diabetes type 2, uncontrolled ([place])   . Diverticulosis   . Exogenous obesity   . GERD (gastroesophageal reflux disease)   . Hyperlipidemia   . Hypertension   . Metabolic syndrome   . Osteopenia   . Seasonal allergies    seasonal  . Tubular adenoma of colon     Past Surgical History:  Procedure Laterality Date  . COLONOSCOPY     11/90,11/91,1994,2000,2010  . DILATION AND CURETTAGE OF UTERUS    . DILATION AND CURETTAGE OF UTERUS  1997  .  FOOT SURGERY     x 2  . HAND SURGERY    . POLYPECTOMY    . TUBAL LIGATION    . uterine polyps removed     x2    Allergies  Allergen Reactions  . Crestor [Rosuvastatin Calcium]     Myalgias   . Gemfibrozil Itching  . Iodine     Skin rash  . Lipitor [Atorvastatin Calcium]     Myalgias   . Lovastatin     myalgias  . Sulfonamide Derivatives     REACTION: n\T\v  . Sulfur     Made infection worse  . Zetia [Ezetimibe]     Cough, dry    Allergies as of 11/30/2017      Reactions   Crestor [rosuvastatin Calcium]    Myalgias   Gemfibrozil Itching   Iodine    Skin rash   Lipitor [atorvastatin Calcium]    Myalgias   Lovastatin    myalgias   Sulfonamide Derivatives    REACTION: n\T\v   Sulfur    Made infection worse   Zetia [ezetimibe]    Cough, dry      Medication List        Accurate as of 11/30/17 11:59 PM. Always use your most recent med list.  accu-chek soft touch lancets Use as instructed   allopurinol 100 MG tablet Commonly known as:  ZYLOPRIM TAKE 2 TABLETS BY MOUTH ONCE DAILY.   ALPRAZolam 0.25 MG tablet Commonly known as:  XANAX TAKE (1) TABLET TWICE A DAY AS NEEDED.   bisoprolol-hydrochlorothiazide 10-6.25 MG tablet Commonly known as:  ZIAC Take 1 tablet by mouth daily.   clobetasol cream 0.05 % Commonly known as:  TEMOVATE Apply 1 application topically as needed.   Fish Oil 1000 MG Caps Take 1 capsule by mouth daily.   FLAXSEED OIL PO Take 1 tablet by mouth daily.   FLUoxetine 40 MG capsule Commonly known as:  PROZAC TAKE (1) CAPSULE DAILY.   glucose blood test strip Use to check blood sugar three times a day and PRN,  Dx code: E11.65   metFORMIN 500 MG tablet Commonly known as:  GLUCOPHAGE Take 1 tablet (500 mg total) by mouth 2 (two) times daily with a meal.       Review of Systems:  Review of Systems  Constitutional: Negative for activity change, appetite change, chills, diaphoresis, fatigue, fever and unexpected  weight change.  HENT: Positive for hearing loss. Negative for congestion and voice change.   Respiratory: Negative for cough, shortness of breath and wheezing.   Cardiovascular: Positive for leg swelling. Negative for chest pain and palpitations.  Gastrointestinal: Positive for abdominal pain. Negative for abdominal distention, constipation, diarrhea, nausea and vomiting.  Genitourinary: Positive for dysuria, frequency and urgency. Negative for difficulty urinating.  Musculoskeletal: Positive for arthralgias and gait problem. Negative for joint swelling.  Skin: Negative for color change and pallor.  Neurological: Negative for dizziness, speech difficulty, weakness and headaches.  Psychiatric/Behavioral: Negative for agitation, behavioral problems, confusion, hallucinations and sleep disturbance. The patient is nervous/anxious.     Health Maintenance  Topic Date Due  . COLONOSCOPY  04/02/2017  . INFLUENZA VACCINE  10/19/2017  . FOOT EXAM  11/15/2017  . OPHTHALMOLOGY EXAM  02/06/2018  . HEMOGLOBIN A1C  04/14/2018  . MAMMOGRAM  06/21/2018  . TETANUS/TDAP  03/02/2025  . DEXA SCAN  Completed  . PNA vac Low Risk Adult  Completed    Physical Exam: Vitals:   11/30/17 1418  BP: 130/70  Pulse: 67  Resp: 20  Temp: 98.2 F (36.8 C)  TempSrc: Oral  SpO2: 97%  Weight: 204 lb 6.4 oz (92.7 kg)  Height: '5\' 2"'  (1.575 m)   Body mass index is 37.39 kg/m. Physical Exam  Constitutional: She is oriented to person, place, and time. She appears well-developed and well-nourished.  HENT:  Head: Normocephalic and atraumatic.  Eyes: Pupils are equal, round, and reactive to light. EOM are normal.  Neck: Normal range of motion. Neck supple. No JVD present. No thyromegaly present.  Cardiovascular: Normal rate and regular rhythm.  No murmur heard. Pulmonary/Chest: Effort normal. She has no wheezes. She has no rales.  Abdominal: Soft. Bowel sounds are normal. She exhibits no distension. There is  tenderness. There is no rebound and no guarding.  Suprapubic area discomfort upon palpation.   Musculoskeletal: She exhibits edema.  Trace edema BLE  Neurological: She is alert and oriented to person, place, and time. No cranial nerve deficit. She exhibits normal muscle tone. Coordination normal.  Skin: Skin is warm and dry.  Psychiatric: She has a normal mood and affect. Her behavior is normal. Judgment and thought content normal.    Labs reviewed: Basic Metabolic Panel: Recent Labs    03/28/17 0705 04/10/17 0000 10/12/17 0000  NA  134* 137 135  K 4.6 4.5 4.7  CL 97* 100 100  CO2 '30 27 26  ' GLUCOSE 93 118* 125*  BUN 26* 26* 29*  CREATININE 1.33* 1.15* 1.15*  CALCIUM 9.7 9.5 9.3   Liver Function Tests: Recent Labs    03/28/17 0705 10/12/17 0000  AST 16 17  ALT 13 15  BILITOT 0.5 0.6  PROT 6.4 6.5   No results for input(s): LIPASE, AMYLASE in the last 8760 hours. No results for input(s): AMMONIA in the last 8760 hours. CBC: No results for input(s): WBC, NEUTROABS, HGB, HCT, MCV, PLT in the last 8760 hours. Lipid Panel: Recent Labs    10/12/17 0000  CHOL 254*  HDL 50*  LDLCALC 164*  TRIG 235*  CHOLHDL 5.1*   Lab Results  Component Value Date   HGBA1C 7.1 (H) 10/12/2017    Procedures since last visit: No results found.  Assessment/Plan Urinary urgency Urinary urgency x 2-3 days, UA C/S stat to r/i UTI  Urinary frequency New onset 2-3 days, will obtain UA C/S to R/I UTI.   Dysuria Dysuria and lower abd discomfort x 2-3 days, will obtain UA C/S to R/I UTI.   CKD (chronic kidney disease) stage 3, GFR 30-59 ml/min Last creat 1.15 09/2017, 03/2017. Will update CMP   Diabetes mellitus type 2 in obese ([place]) Continue Metformin 561m bid, diet exercise, update TSH, Hgb a1c, CBC prior to the next appointment.   Dyslipidemia Didn't tolerate statin, will update lipid panel prior to the next appointment.     Labs/tests ordered: CBC,  CMP eGFR  Hgb a1c, TSH,   lipid panel prior to the next appointment. UA C/S stat  Next appt:  6 months  Time spend 25 minutes.

## 2017-11-30 NOTE — Assessment & Plan Note (Signed)
Didn't tolerate statin, will update lipid panel prior to the next appointment.

## 2017-11-30 NOTE — Assessment & Plan Note (Signed)
Continue Metformin 500mg  bid, diet exercise, update TSH, Hgb a1c, CBC prior to the next appointment.

## 2017-11-30 NOTE — Assessment & Plan Note (Signed)
New onset 2-3 days, will obtain UA C/S to R/I UTI.

## 2017-12-01 ENCOUNTER — Encounter: Payer: Self-pay | Admitting: Nurse Practitioner

## 2017-12-04 ENCOUNTER — Telehealth: Payer: Self-pay | Admitting: *Deleted

## 2017-12-04 NOTE — Telephone Encounter (Signed)
Called patient to see if symptoms were any better or worse, she stated that this morning she did feel a little irritation when she went to the bathroom. I informed her that [facility] thought that it would be a good idea to recheck her urine because the sample suggested a recollect. She agreed and will come down in the am when [facility] was here.

## 2017-12-07 ENCOUNTER — Encounter: Payer: Self-pay | Admitting: Nurse Practitioner

## 2017-12-07 ENCOUNTER — Non-Acute Institutional Stay: Payer: Medicare Other | Admitting: Nurse Practitioner

## 2017-12-07 DIAGNOSIS — N390 Urinary tract infection, site not specified: Secondary | ICD-10-CM | POA: Diagnosis not present

## 2017-12-07 DIAGNOSIS — R319 Hematuria, unspecified: Secondary | ICD-10-CM | POA: Diagnosis not present

## 2017-12-07 MED ORDER — NITROFURANTOIN MONOHYD MACRO 100 MG PO CAPS: 100.0000 mg | ORAL_CAPSULE | Freq: Two times a day (BID) | ORAL | 0 refills | Status: AC

## 2017-12-07 NOTE — Progress Notes (Signed)
Location:   clinic [facility]   Place of Service:  Clinic (12) Provider: Marlana Latus NP  Code Status: DNR Goals of Care: [place] 10/17/2017  Does Patient Have a Medical Advance Directive? Yes  Type of Paramedic of [place];Living will;Out of facility DNR (pink MOST or yellow form)  Does patient want to make changes to medical advance directive? No - Patient declined  Copy of [place] in Chart? Yes  Pre-existing out of facility DNR order (yellow form or pink MOST form) Yellow form placed in chart (order not valid for inpatient use)     Chief Complaint  Patient presents with  . Acute Visit    c/o urge to urinate, blood seen in urine    HPI: Patient is a 77 y.o. female seen today for an acute visit for persisted urinary urgency, urinary irritation and  frequency, lower abd discomfort. She stated pinkish urine seen this morning, looks like blood. She is afebrile. UA C/S 11/30/17 showed E. Coli 10,000-50,000c/ml, then repeated UA C/S 12/05/17 showed multiple organism. Given persisted urinary symptoms, will treat the patient for clinically diagnosed UTI  Past Medical History:  Diagnosis Date  . Allergy   . Anemia   . Anxiety   . Arthritis    with gout  . Carotid bruit    right  . Cataract    bil cataracts removed  . Chest pain   . Colon polyps   . Depression   . Diabetes type 2, uncontrolled ([place])   . Diverticulosis   . Exogenous obesity   . GERD (gastroesophageal reflux disease)   . Hyperlipidemia   . Hypertension   . Metabolic syndrome   . Osteopenia   . Seasonal allergies    seasonal  . Tubular adenoma of colon     Past Surgical History:  Procedure Laterality Date  . COLONOSCOPY     11/90,11/91,1994,2000,2010  . DILATION AND CURETTAGE OF UTERUS    . DILATION AND CURETTAGE OF UTERUS  1997  . FOOT SURGERY     x 2  . HAND SURGERY    . POLYPECTOMY    . TUBAL LIGATION    . uterine polyps removed     x2     Allergies  Allergen Reactions  . Crestor [Rosuvastatin Calcium]     Myalgias   . Gemfibrozil Itching  . Iodine     Skin rash  . Lipitor [Atorvastatin Calcium]     Myalgias   . Lovastatin     myalgias  . Sulfonamide Derivatives     REACTION: n\T\v  . Sulfur     Made infection worse  . Zetia [Ezetimibe]     Cough, dry    Allergies as of 12/07/2017      Reactions   Crestor [rosuvastatin Calcium]    Myalgias   Gemfibrozil Itching   Iodine    Skin rash   Lipitor [atorvastatin Calcium]    Myalgias   Lovastatin    myalgias   Sulfonamide Derivatives    REACTION: n\T\v   Sulfur    Made infection worse   Zetia [ezetimibe]    Cough, dry      Medication List        Accurate as of 12/07/17 11:59 PM. Always use your most recent med list.          accu-chek soft touch lancets Use as instructed   allopurinol 100 MG tablet Commonly known as:  ZYLOPRIM TAKE 2 TABLETS  BY MOUTH ONCE DAILY.   ALPRAZolam 0.25 MG tablet Commonly known as:  XANAX TAKE (1) TABLET TWICE A DAY AS NEEDED.   bisoprolol-hydrochlorothiazide 10-6.25 MG tablet Commonly known as:  ZIAC Take 1 tablet by mouth daily.   clobetasol cream 0.05 % Commonly known as:  TEMOVATE Apply 1 application topically as needed.   Fish Oil 1000 MG Caps Take 1 capsule by mouth daily.   FLAXSEED OIL PO Take 1 tablet by mouth daily.   FLUoxetine 40 MG capsule Commonly known as:  PROZAC TAKE (1) CAPSULE DAILY.   glucose blood test strip Use to check blood sugar three times a day and PRN,  Dx code: E11.65   metFORMIN 500 MG tablet Commonly known as:  GLUCOPHAGE Take 1 tablet (500 mg total) by mouth 2 (two) times daily with a meal.   nitrofurantoin (macrocrystal-monohydrate) 100 MG capsule Commonly known as:  MACROBID Take 1 capsule (100 mg total) by mouth 2 (two) times daily for 7 days.       Review of Systems:  Review of Systems  Constitutional: Negative for activity change, appetite change,  chills, diaphoresis, fatigue, fever and unexpected weight change.  Respiratory: Negative for cough, shortness of breath and wheezing.   Cardiovascular: Positive for leg swelling. Negative for chest pain and palpitations.  Gastrointestinal: Positive for abdominal pain. Negative for abdominal distention, blood in stool, constipation, diarrhea, nausea and vomiting.       Lower abd discomfort.   Genitourinary: Positive for dysuria, frequency, hematuria, pelvic pain and urgency. Negative for difficulty urinating, vaginal discharge and vaginal pain.  Musculoskeletal: Positive for arthralgias. Negative for back pain and gait problem.  Skin: Negative for color change and pallor.  Neurological: Negative for dizziness, speech difficulty, weakness and headaches.  Psychiatric/Behavioral: Negative for agitation and behavioral problems.    Health Maintenance  Topic Date Due  . COLONOSCOPY  04/02/2017  . FOOT EXAM  11/15/2017  . OPHTHALMOLOGY EXAM  02/06/2018  . HEMOGLOBIN A1C  04/14/2018  . MAMMOGRAM  06/21/2018  . TETANUS/TDAP  03/02/2025  . INFLUENZA VACCINE  Completed  . DEXA SCAN  Completed  . PNA vac Low Risk Adult  Completed    Physical Exam: Vitals:   12/07/17 1407  BP: (!) 150/80  Pulse: 63  Resp: 20  Temp: 98.1 F (36.7 C)  TempSrc: Oral  SpO2: 98%  Weight: 200 lb 3.2 oz (90.8 kg)  Height: 5\' 2"  (1.575 m)   Body mass index is 36.62 kg/m. Physical Exam  Constitutional: She appears well-developed.  HENT:  Head: Normocephalic and atraumatic.  Eyes: Pupils are equal, round, and reactive to light. EOM are normal.  Neck: Normal range of motion. Neck supple. No JVD present.  Cardiovascular: Normal rate and regular rhythm.  No murmur heard. Pulmonary/Chest: Effort normal. She has no wheezes. She has no rales.  Abdominal: Soft. She exhibits no distension. There is tenderness. There is no rebound and no guarding.  Supra pubic area discomfort when palpated.   Musculoskeletal: She  exhibits edema.  Trace edema is stable and chronic.   Neurological: She is alert. No cranial nerve deficit. She exhibits normal muscle tone. Coordination normal.  Skin: Skin is warm and dry.  Psychiatric: She has a normal mood and affect. Her behavior is normal.    Labs reviewed: Basic Metabolic Panel: Recent Labs    03/28/17 0705 04/10/17 0000 10/12/17 0000  NA 134* 137 135  K 4.6 4.5 4.7  CL 97* 100 100  CO2 30 27 26  GLUCOSE 93 118* 125*  BUN 26* 26* 29*  CREATININE 1.33* 1.15* 1.15*  CALCIUM 9.7 9.5 9.3   Liver Function Tests: Recent Labs    03/28/17 0705 10/12/17 0000  AST 16 17  ALT 13 15  BILITOT 0.5 0.6  PROT 6.4 6.5   No results for input(s): LIPASE, AMYLASE in the last 8760 hours. No results for input(s): AMMONIA in the last 8760 hours. CBC: No results for input(s): WBC, NEUTROABS, HGB, HCT, MCV, PLT in the last 8760 hours. Lipid Panel: Recent Labs    10/12/17 0000  CHOL 254*  HDL 50*  LDLCALC 164*  TRIG 235*  CHOLHDL 5.1*   Lab Results  Component Value Date   HGBA1C 7.1 (H) 10/12/2017    Procedures since last visit: No results found.  Assessment/Plan UTI (urinary tract infection) persisted urinary urgency, frequency, lower abd discomfort. She stated pinkish urine seen this morning, looks like blood. She is afebrile. UA C/S 11/30/17 showed E. Coli 10,000-50,000c/ml, then repeated UA C/S 12/05/17 showed multiple organism. Given persisted urinary symptoms, will treat the patient for clinically diagnosed UTI  Nitrofurantoin 100mg  bid po x 7 days. Call back if not tolerated. Possible choices of ABT: Keflex, Cipro, Levaquin    Labs/tests ordered:  Pend urine culture  Next appt:  01/09/2018   time spend 25 minutes.

## 2017-12-07 NOTE — Patient Instructions (Signed)
Nitrofurantoin 100mg  bid x 7 days for UTI, call back if not tolerated

## 2017-12-07 NOTE — Assessment & Plan Note (Signed)
persisted urinary urgency, frequency, lower abd discomfort. She stated pinkish urine seen this morning, looks like blood. She is afebrile. UA C/S 11/30/17 showed E. Coli 10,000-50,000c/ml, then repeated UA C/S 12/05/17 showed multiple organism. Given persisted urinary symptoms, will treat the patient for clinically diagnosed UTI  Nitrofurantoin 100mg  bid po x 7 days. Call back if not tolerated. Possible choices of ABT: Keflex, Cipro, Levaquin

## 2017-12-26 ENCOUNTER — Other Ambulatory Visit: Payer: Self-pay | Admitting: Nurse Practitioner

## 2017-12-28 ENCOUNTER — Other Ambulatory Visit: Payer: Self-pay | Admitting: Nurse Practitioner

## 2018-01-01 ENCOUNTER — Other Ambulatory Visit: Payer: Self-pay | Admitting: Nurse Practitioner

## 2018-01-08 NOTE — Telephone Encounter (Signed)
Please send it in an requesting for approval form.

## 2018-01-09 ENCOUNTER — Other Ambulatory Visit: Payer: Medicare Other

## 2018-01-09 DIAGNOSIS — N183 Chronic kidney disease, stage 3 unspecified: Secondary | ICD-10-CM

## 2018-01-09 DIAGNOSIS — I119 Hypertensive heart disease without heart failure: Secondary | ICD-10-CM

## 2018-01-09 DIAGNOSIS — E1169 Type 2 diabetes mellitus with other specified complication: Secondary | ICD-10-CM

## 2018-01-09 DIAGNOSIS — E669 Obesity, unspecified: Principal | ICD-10-CM

## 2018-01-10 LAB — COMPLETE METABOLIC PANEL WITH GFR
AG RATIO: 1.7 (calc) (ref 1.0–2.5)
ALT: 16 U/L (ref 6–29)
AST: 17 U/L (ref 10–35)
Albumin: 4 g/dL (ref 3.6–5.1)
Alkaline phosphatase (APISO): 91 U/L (ref 33–130)
BUN/Creatinine Ratio: 23 (calc) — ABNORMAL HIGH (ref 6–22)
BUN: 24 mg/dL (ref 7–25)
CALCIUM: 9.3 mg/dL (ref 8.6–10.4)
CO2: 28 mmol/L (ref 20–32)
CREATININE: 1.06 mg/dL — AB (ref 0.60–0.93)
Chloride: 103 mmol/L (ref 98–110)
GFR, EST NON AFRICAN AMERICAN: 50 mL/min/{1.73_m2} — AB (ref >=60)
GFR, Est African American: 58 mL/min/{1.73_m2} — ABNORMAL LOW (ref >=60)
GLUCOSE: 129 mg/dL — AB (ref 65–99)
Globulin: 2.4 g/dL (calc) (ref 1.9–3.7)
POTASSIUM: 4.3 mmol/L (ref 3.5–5.3)
Sodium: 139 mmol/L (ref 135–146)
Total Bilirubin: 0.5 mg/dL (ref 0.2–1.2)
Total Protein: 6.4 g/dL (ref 6.1–8.1)

## 2018-01-10 LAB — TEST AUTHORIZATION

## 2018-01-10 LAB — TSH: TSH: 1.85 mIU/L (ref 0.40–4.50)

## 2018-01-10 LAB — LIPID PANEL
Cholesterol: 251 mg/dL — ABNORMAL HIGH (ref <200)
HDL: 51 mg/dL (ref >50)
LDL Cholesterol (Calc): 166 mg/dL (calc) — ABNORMAL HIGH
NON-HDL CHOLESTEROL (CALC): 200 mg/dL — AB (ref <130)
Total CHOL/HDL Ratio: 4.9 (calc) (ref <5.0)
Triglycerides: 181 mg/dL — ABNORMAL HIGH (ref <150)

## 2018-01-10 LAB — HEMOGLOBIN A1C
HEMOGLOBIN A1C: 6.7 %{Hb} — AB (ref <5.7)
Mean Plasma Glucose: 146 (calc)
eAG (mmol/L): 8.1 (calc)

## 2018-01-18 ENCOUNTER — Non-Acute Institutional Stay: Payer: Medicare Other | Admitting: Nurse Practitioner

## 2018-01-18 ENCOUNTER — Encounter: Payer: Self-pay | Admitting: Nurse Practitioner

## 2018-01-18 DIAGNOSIS — I119 Hypertensive heart disease without heart failure: Secondary | ICD-10-CM | POA: Diagnosis not present

## 2018-01-18 DIAGNOSIS — N183 Chronic kidney disease, stage 3 unspecified: Secondary | ICD-10-CM

## 2018-01-18 DIAGNOSIS — M1A4410 Other secondary chronic gout, right hand, without tophus (tophi): Secondary | ICD-10-CM

## 2018-01-18 DIAGNOSIS — F419 Anxiety disorder, unspecified: Secondary | ICD-10-CM

## 2018-01-18 DIAGNOSIS — E785 Hyperlipidemia, unspecified: Secondary | ICD-10-CM

## 2018-01-18 DIAGNOSIS — E1169 Type 2 diabetes mellitus with other specified complication: Secondary | ICD-10-CM

## 2018-01-18 DIAGNOSIS — E669 Obesity, unspecified: Secondary | ICD-10-CM

## 2018-01-18 NOTE — Assessment & Plan Note (Addendum)
Mood is stable, continue Prozac 40mg  qd and prn Alprazolam.

## 2018-01-18 NOTE — Progress Notes (Signed)
Location:   clinic [facility]   Place of Service:  Clinic (12)clinic [facility]  Provider: Marlana Latus NP  Code Status: DNR [place] 10[place]17[place]2017  Does Patient Have a Medical Advance Directive? Yes  Type of Paramedic of [place];Living will;Out of facility DNR (pink MOST or yellow form)  Does patient want to make changes to medical advance directive? No - Patient declined  Copy of [place] in Chart? Yes  Pre-existing out of facility DNR order (yellow form or pink MOST form) Yellow form placed in chart (order not valid for inpatient use)     Chief Complaint  Patient presents with  . Medical Management of Chronic Issues    3 mo f/u    HPI: Patient is a 77 y.o. female seen today for medical management of chronic diseases.     The patient has history of T2DM, on Metoprolol 500mg  bid, last Hgb a1c 6.7 01/09/18. Hx of depression/anxiety, her mood is stable on Prozac 40mg  qd and Alprazolam 0.25mg  bid prn.   Blood pressure is controlled on Bisoprolol/HCTZ 10/6.25mg  bid. No gout flare ups, on Allopurinol 100mg  qd.  Past Medical History:  Diagnosis Date  . Allergy   . Anemia   . Anxiety   . Arthritis    with gout  . Carotid bruit    right  . Cataract    bil cataracts removed  . Chest pain   . Colon polyps   . Depression   . Diabetes type 2, uncontrolled ([place])   . Diverticulosis   . Exogenous obesity   . GERD (gastroesophageal reflux disease)   . Hyperlipidemia   . Hypertension   . Metabolic syndrome   . Osteopenia   . Seasonal allergies    seasonal  . Tubular adenoma of colon     Past Surgical History:  Procedure Laterality Date  . COLONOSCOPY     11/90,11/91,1994,2000,2010  . DILATION AND CURETTAGE OF UTERUS    . DILATION AND CURETTAGE OF UTERUS  1997  . FOOT SURGERY     x 2  . HAND SURGERY    . POLYPECTOMY    . TUBAL LIGATION    . uterine polyps removed     x2    Allergies  Allergen Reactions  .  Crestor [Rosuvastatin Calcium]     Myalgias   . Gemfibrozil Itching  . Iodine     Skin rash  . Lipitor [Atorvastatin Calcium]     Myalgias   . Lovastatin     myalgias  . Sulfonamide Derivatives     REACTION: n\T\v  . Sulfur     Made infection worse  . Zetia [Ezetimibe]     Cough, dry    Allergies as of 01/18/2018      Reactions   Crestor [rosuvastatin Calcium]    Myalgias   Gemfibrozil Itching   Iodine    Skin rash   Lipitor [atorvastatin Calcium]    Myalgias   Lovastatin    myalgias   Sulfonamide Derivatives    REACTION: n\T\v   Sulfur    Made infection worse   Zetia [ezetimibe]    Cough, dry      Medication List        Accurate as of 01/18/18 11:59 PM. Always use your most recent med list.          accu-chek soft touch lancets Use as instructed   allopurinol 100 MG tablet Commonly known as:  ZYLOPRIM  TAKE 2 TABLETS BY MOUTH ONCE DAILY.   ALPRAZolam 0.25 MG tablet Commonly known as:  XANAX TAKE (1) TABLET TWICE A DAY AS NEEDED.   bisoprolol-hydrochlorothiazide 10-6.25 MG tablet Commonly known as:  ZIAC TAKE 1 TABLET ONCE DAILY.   clobetasol cream 0.05 % Commonly known as:  TEMOVATE Apply 1 application topically as needed.   Fish Oil 1000 MG Caps Take 1 capsule by mouth daily.   FLAXSEED OIL PO Take 1 tablet by mouth daily.   FLUoxetine 40 MG capsule Commonly known as:  PROZAC TAKE (1) CAPSULE DAILY.   glucose blood test strip Use to check blood sugar three times a day and PRN,  Dx code: E11.65   metFORMIN 500 MG tablet Commonly known as:  GLUCOPHAGE Take 1 tablet (500 mg total) by mouth 2 (two) times daily with a meal.       Review of Systems:  Review of Systems  Constitutional: Negative for activity change, appetite change, chills, diaphoresis, fatigue and fever.  HENT: Positive for hearing loss. Negative for congestion and voice change.   Respiratory: Negative for cough, shortness of breath and wheezing.   Cardiovascular:  Positive for leg swelling. Negative for chest pain and palpitations.  Gastrointestinal: Negative for abdominal distention, abdominal pain, constipation, diarrhea, nausea and vomiting.  Genitourinary: Negative for difficulty urinating, dysuria and urgency.  Musculoskeletal: Positive for arthralgias and gait problem.  Skin: Negative for color change and pallor.  Neurological: Negative for dizziness, speech difficulty, weakness and headaches.  Psychiatric/Behavioral: Negative for agitation, hallucinations and sleep disturbance. The patient is not nervous/anxious.     Health Maintenance  Topic Date Due  . COLONOSCOPY  04/02/2017  . FOOT EXAM  11/15/2017  . OPHTHALMOLOGY EXAM  02/06/2018  . MAMMOGRAM  06/21/2018  . HEMOGLOBIN A1C  07/11/2018  . TETANUS/TDAP  03/02/2025  . INFLUENZA VACCINE  Completed  . DEXA SCAN  Completed  . PNA vac Low Risk Adult  Completed    Physical Exam: Vitals:   01/18/18 1401  BP: 130/62  Pulse: 65  Resp: 18  Temp: 98.6 F (37 C)  SpO2: 97%  Weight: 206 lb 3.2 oz (93.5 kg)  Height: 5\' 2"  (1.575 m)   Body mass index is 37.71 kg/m. Physical Exam  Constitutional: She is oriented to person, place, and time. She appears well-developed.  HENT:  Head: Normocephalic and atraumatic.  Eyes: Pupils are equal, round, and reactive to light. EOM are normal.  Neck: Normal range of motion. Neck supple. No JVD present. No thyromegaly present.  Cardiovascular: Normal rate and regular rhythm.  No murmur heard. Pulmonary/Chest: Effort normal. She has no wheezes. She has no rales.  Abdominal: Soft. She exhibits no distension. There is no tenderness. There is no rebound.  Musculoskeletal: She exhibits edema.  Chronic trace edema BLE  Neurological: She is alert and oriented to person, place, and time. No cranial nerve deficit. She exhibits normal muscle tone. Coordination normal.  Skin: Skin is warm and dry.  Psychiatric: She has a normal mood and affect. Her behavior  is normal. Judgment and thought content normal.    Labs reviewed: Basic Metabolic Panel: Recent Labs    04/10/17 0000 10/12/17 0000 01/09/18 0800  NA 137 135 139  K 4.5 4.7 4.3  CL 100 100 103  CO2 27 26 28   GLUCOSE 118* 125* 129*  BUN 26* 29* 24  CREATININE 1.15* 1.15* 1.06*  CALCIUM 9.5 9.3 9.3  TSH  --   --  1.85   Liver  Function Tests: Recent Labs    03/28/17 0705 10/12/17 0000 01/09/18 0800  AST 16 17 17   ALT 13 15 16   BILITOT 0.5 0.6 0.5  PROT 6.4 6.5 6.4   No results for input(s): LIPASE, AMYLASE in the last 8760 hours. No results for input(s): AMMONIA in the last 8760 hours. CBC: No results for input(s): WBC, NEUTROABS, HGB, HCT, MCV, PLT in the last 8760 hours. Lipid Panel: Recent Labs    10/12/17 0000 01/09/18 0800  CHOL 254* 251*  HDL 50* 51  LDLCALC 164* 166*  TRIG 235* 181*  CHOLHDL 5.1* 4.9   Lab Results  Component Value Date   HGBA1C 6.7 (H) 01/09/2018    Procedures since last visit: No results found.  Assessment/Plan  Benign hypertensive heart disease without heart failure Controlled blood pressure, continue Bisoprolol/HCTZ 10/6.25mg  qd.   Diabetes mellitus type 2 in obese (HCC) Controlled, Hgb a1c 6.7 01/09/18, continue Metformin 500mg  bid.   CKD (chronic kidney disease) stage 3, GFR 30-59 ml/min Stable, creat 1.06, slightly improved from 1.15 10/12/17  Dyslipidemia Elevated, continue diet and exercise, continue Omega 3 supplement, may consider Lipitor 10mg  qd, repeat lipid panel prior to the next appointment.   Anxiety Mood is stable, continue Prozac 40mg  qd and prn Alprazolam.   Chronic gout without tophus Stable, continue diet, continue Allopurinol 100mg  qd.    Labs/tests ordered:  Lipid penal prior to the next appointment.   Next appt:  05/29/2018  Time spend 25 minutes.

## 2018-01-18 NOTE — Assessment & Plan Note (Signed)
Controlled, Hgb a1c 6.7 01/09/18, continue Metformin 500mg  bid.

## 2018-01-18 NOTE — Assessment & Plan Note (Signed)
Stable, continue diet, continue Allopurinol 100mg  qd.

## 2018-01-18 NOTE — Assessment & Plan Note (Signed)
Controlled blood pressure, continue Bisoprolol/HCTZ 10/6.25mg  qd.

## 2018-01-18 NOTE — Patient Instructions (Signed)
F/u in clinic 05/2017, update lipid panel prior the appointment.

## 2018-01-18 NOTE — Assessment & Plan Note (Signed)
Stable, creat 1.06, slightly improved from 1.15 10/12/17

## 2018-01-18 NOTE — Assessment & Plan Note (Signed)
Elevated, continue diet and exercise, continue Omega 3 supplement, may consider Lipitor 10mg  qd, repeat lipid panel prior to the next appointment.

## 2018-01-19 ENCOUNTER — Encounter: Payer: Self-pay | Admitting: Nurse Practitioner

## 2018-02-12 LAB — HM DIABETES EYE EXAM

## 2018-02-13 ENCOUNTER — Encounter: Payer: Self-pay | Admitting: *Deleted

## 2018-03-05 ENCOUNTER — Non-Acute Institutional Stay: Payer: Medicare Other

## 2018-03-05 VITALS — BP 128/78 | HR 66 | Temp 97.6°F | Ht 62.0 in | Wt 201.0 lb

## 2018-03-05 DIAGNOSIS — Z0000 Encounter for general adult medical examination without abnormal findings: Secondary | ICD-10-CM | POA: Diagnosis not present

## 2018-03-05 NOTE — Patient Instructions (Signed)
Brittany Mcclure , Thank you for taking time to come for your Medicare Wellness Visit. I appreciate your ongoing commitment to your health goals. Please review the following plan we discussed and let me know if I can assist you in the future.   Screening recommendations/referrals: Colonoscopy excluded, over age 79 Mammogram excluded, over age 79 Bone Density up to date Recommended yearly ophthalmology/optometry visit for glaucoma screening and checkup Recommended yearly dental visit for hygiene and checkup  Vaccinations: Influenza vaccine up to date Pneumococcal vaccine up to date, completed Tdap vaccine up to date, due 03/02/2025 Shingles vaccine up to date, completed    Advanced directives: in chart  Conditions/risks identified: none  Next appointment: [facility] 06/14/2018 @ 2pm   Preventive Care 79 Years and Older, Female Preventive care refers to lifestyle choices and visits with your health care provider that can promote health and wellness. What does preventive care include?  A yearly physical exam. This is also called an annual well check.  Dental exams once or twice a year.  Routine eye exams. Ask your health care provider how often you should have your eyes checked.  Personal lifestyle choices, including:  Daily care of your teeth and gums.  Regular physical activity.  Eating a healthy diet.  Avoiding tobacco and drug use.  Limiting alcohol use.  Practicing safe sex.  Taking low-dose aspirin every day.  Taking vitamin and mineral supplements as recommended by your health care provider. What happens during an annual well check? The services and screenings done by your health care provider during your annual well check will depend on your age, overall health, lifestyle risk factors, and family history of disease. Counseling  Your health care provider may ask you questions about your:  Alcohol use.  Tobacco use.  Drug use.  Emotional well-being.  Home and  relationship well-being.  Sexual activity.  Eating habits.  History of falls.  Memory and ability to understand (cognition).  Work and work Statistician.  Reproductive health. Screening  You may have the following tests or measurements:  Height, weight, and BMI.  Blood pressure.  Lipid and cholesterol levels. These may be checked every 5 years, or more frequently if you are over 31 years old.  Skin check.  Lung cancer screening. You may have this screening every year starting at age 79 if you have a 30-pack-year history of smoking and currently smoke or have quit within the past 15 years.  Fecal occult blood test (FOBT) of the stool. You may have this test every year starting at age 79.  Flexible sigmoidoscopy or colonoscopy. You may have a sigmoidoscopy every 5 years or a colonoscopy every 10 years starting at age 79.  Hepatitis C blood test.  Hepatitis B blood test.  Sexually transmitted disease (STD) testing.  Diabetes screening. This is done by checking your blood sugar (glucose) after you have not eaten for a while (fasting). You may have this done every 1-3 years.  Bone density scan. This is done to screen for osteoporosis. You may have this done starting at age 79.  Mammogram. This may be done every 1-2 years. Talk to your health care provider about how often you should have regular mammograms. Talk with your health care provider about your test results, treatment options, and if necessary, the need for more tests. Vaccines  Your health care provider may recommend certain vaccines, such as:  Influenza vaccine. This is recommended every year.  Tetanus, diphtheria, and acellular pertussis (Tdap, Td) vaccine. You may  need a Td booster every 10 years.  Zoster vaccine. You may need this after age 79.  Pneumococcal 13-valent conjugate (PCV13) vaccine. One dose is recommended after age 18.  Pneumococcal polysaccharide (PPSV23) vaccine. One dose is recommended after  age 79. Talk to your health care provider about which screenings and vaccines you need and how often you need them. This information is not intended to replace advice given to you by your health care provider. Make sure you discuss any questions you have with your health care provider. Document Released: 04/03/2015 Document Revised: 11/25/2015 Document Reviewed: 01/06/2015 Elsevier Interactive Patient Education  2017 [place] Prevention in the Home Falls can cause injuries. They can happen to people of all ages. There are many things you can do to make your home safe and to help prevent falls. What can I do on the outside of my home?  Regularly fix the edges of walkways and driveways and fix any cracks.  Remove anything that might make you trip as you walk through a door, such as a raised step or threshold.  Trim any bushes or trees on the path to your home.  Use bright outdoor lighting.  Clear any walking paths of anything that might make someone trip, such as rocks or tools.  Regularly check to see if handrails are loose or broken. Make sure that both sides of any steps have handrails.  Any raised decks and porches should have guardrails on the edges.  Have any leaves, snow, or ice cleared regularly.  Use sand or salt on walking paths during winter.  Clean up any spills in your garage right away. This includes oil or grease spills. What can I do in the bathroom?  Use night lights.  Install grab bars by the toilet and in the tub and shower. Do not use towel bars as grab bars.  Use non-skid mats or decals in the tub or shower.  If you need to sit down in the shower, use a plastic, non-slip stool.  Keep the floor dry. Clean up any water that spills on the floor as soon as it happens.  Remove soap buildup in the tub or shower regularly.  Attach bath mats securely with double-sided non-slip rug tape.  Do not have throw rugs and other things on the floor that can  make you trip. What can I do in the bedroom?  Use night lights.  Make sure that you have a light by your bed that is easy to reach.  Do not use any sheets or blankets that are too big for your bed. They should not hang down onto the floor.  Have a firm chair that has side arms. You can use this for support while you get dressed.  Do not have throw rugs and other things on the floor that can make you trip. What can I do in the kitchen?  Clean up any spills right away.  Avoid walking on wet floors.  Keep items that you use a lot in easy-to-reach places.  If you need to reach something above you, use a strong step stool that has a grab bar.  Keep electrical cords out of the way.  Do not use floor polish or wax that makes floors slippery. If you must use wax, use non-skid floor wax.  Do not have throw rugs and other things on the floor that can make you trip. What can I do with my stairs?  Do not leave any items on  the stairs.  Make sure that there are handrails on both sides of the stairs and use them. Fix handrails that are broken or loose. Make sure that handrails are as long as the stairways.  Check any carpeting to make sure that it is firmly attached to the stairs. Fix any carpet that is loose or worn.  Avoid having throw rugs at the top or bottom of the stairs. If you do have throw rugs, attach them to the floor with carpet tape.  Make sure that you have a light switch at the top of the stairs and the bottom of the stairs. If you do not have them, ask someone to add them for you. What else can I do to help prevent falls?  Wear shoes that:  Do not have high heels.  Have rubber bottoms.  Are comfortable and fit you well.  Are closed at the toe. Do not wear sandals.  If you use a stepladder:  Make sure that it is fully opened. Do not climb a closed stepladder.  Make sure that both sides of the stepladder are locked into place.  Ask someone to hold it for you,  if possible.  Clearly mark and make sure that you can see:  Any grab bars or handrails.  First and last steps.  Where the edge of each step is.  Use tools that help you move around (mobility aids) if they are needed. These include:  Canes.  Walkers.  Scooters.  Crutches.  Turn on the lights when you go into a dark area. Replace any light bulbs as soon as they burn out.  Set up your furniture so you have a clear path. Avoid moving your furniture around.  If any of your floors are uneven, fix them.  If there are any pets around you, be aware of where they are.  Review your medicines with your doctor. Some medicines can make you feel dizzy. This can increase your chance of falling. Ask your doctor what other things that you can do to help prevent falls. This information is not intended to replace advice given to you by your health care provider. Make sure you discuss any questions you have with your health care provider. Document Released: 01/01/2009 Document Revised: 08/13/2015 Document Reviewed: 04/11/2014 Elsevier Interactive Patient Education  2017 [employer].

## 2018-03-05 NOTE — Progress Notes (Signed)
Subjective:   Brittany Mcclure is a 77 y.o. female who presents for Medicare Annual (Subsequent) preventive examination at [facility]  Last AWV-03/03/2016    Objective:     Vitals: BP 128/78 (BP Location: Left Arm, Patient Position: Sitting)   Pulse 66   Temp 97.6 F (36.4 C) (Oral)   Ht 5\' 2"  (1.575 m)   Wt 201 lb (91.2 kg)   SpO2 98%   BMI 36.76 kg/m   Body mass index is 36.76 kg/m.  Advanced Directives 03/05/2018 10/17/2017 02/21/2017 02/21/2017 11/15/2016 01/21/2015 03/17/2014  Does Patient Have a Medical Advance Directive? Yes Yes Yes Yes Yes Yes Yes  Type of Paramedic of [place];Living will;Out of facility DNR (pink MOST or yellow form) [place];Living will;Out of facility DNR (pink MOST or yellow form) [place];Out of facility DNR (pink MOST or yellow form) - [place];Living will;Out of facility DNR (pink MOST or yellow form) - Living will;Healthcare Power of Attorney  Does patient want to make changes to medical advance directive? No - Patient declined No - Patient declined No - Patient declined - No - Patient declined - -  Copy of [place] in Chart? Yes - validated most recent copy scanned in chart (See row information) Yes Yes - Yes - -  Pre-existing out of facility DNR order (yellow form or pink MOST form) Yellow form placed in chart (order not valid for inpatient use);Pink MOST form placed in chart (order not valid for inpatient use) Yellow form placed in chart (order not valid for inpatient use) Yellow form placed in chart (order not valid for inpatient use);Pink MOST form placed in chart (order not valid for inpatient use) - - - -    Tobacco Social History   Tobacco Use  Smoking Status Former Smoker  Smokeless Tobacco Never Used     Counseling given: Not Answered   Clinical Intake:  Pre-visit preparation completed: No  Pain  : No/denies pain     Diabetes: No  How often do you need to have someone help you when you read instructions, pamphlets, or other written materials from your doctor or pharmacy?: 1 - Never What is the last grade level you completed in school?: 2 years college  Interpreter Needed?: No  Information entered by :: Tyson Dense, RN  Past Medical History:  Diagnosis Date  . Allergy   . Anemia   . Anxiety   . Arthritis    with gout  . Carotid bruit    right  . Cataract    bil cataracts removed  . Chest pain   . Colon polyps   . Depression   . Diabetes type 2, uncontrolled ([place])   . Diverticulosis   . Exogenous obesity   . GERD (gastroesophageal reflux disease)   . Hyperlipidemia   . Hypertension   . Metabolic syndrome   . Osteopenia   . Seasonal allergies    seasonal  . Tubular adenoma of colon    Past Surgical History:  Procedure Laterality Date  . COLONOSCOPY     11/90,11/91,1994,2000,2010  . DILATION AND CURETTAGE OF UTERUS    . DILATION AND CURETTAGE OF UTERUS  1997  . FOOT SURGERY     x 2  . HAND SURGERY    . POLYPECTOMY    . TUBAL LIGATION    . uterine polyps removed     x2   Family History  Problem Relation Age of Onset  . Colon cancer Mother   . Stroke Father   . Hypertension Father   . Heart failure Maternal Grandmother   . Diabetes Maternal Grandmother        ?  Marland Kitchen Rectal cancer Neg Hx   . Stomach cancer Neg Hx   . Esophageal cancer Neg Hx   . Pancreatic cancer Neg Hx    Social History   Socioeconomic History  . Marital status: Widowed    Spouse name: Not on file  . Number of children: 2  . Years of education: Not on file  . Highest education level: Not on file  Occupational History  . Occupation: retired    Fish farm manager: RETIRED  Social Needs  . Financial resource strain: Not hard at all  . Food insecurity:    Worry: Never true    Inability: Never true  . Transportation needs:    Medical: No    Non-medical: No  Tobacco Use  .  Smoking status: Former Research scientist (life sciences)  . Smokeless tobacco: Never Used  Substance and Sexual Activity  . Alcohol use: Yes    Alcohol/week: 0.0 standard drinks    Comment: socially  . Drug use: No  . Sexual activity: Not on file  Lifestyle  . Physical activity:    Days per week: 3 days    Minutes per session: 30 min  . Stress: Only a little  Relationships  . Social connections:    Talks on phone: More than three times a week    Gets together: More than three times a week    Attends religious service: 1 to 4 times per year    Active member of club or organization: Yes    Attends meetings of clubs or organizations: 1 to 4 times per year    Relationship status: Widowed  Other Topics Concern  . Not on file  Social History Narrative   Tobacco use, amount per day now: None      Past tobacco use, amount per day: in her late 88s a pack per day      How many years did you use tobacco: 10      Alcohol use (drinks per week): occasional wine      Diet:      Do you drink/eat things with caffeine? Tea, soft drinks and coffee      Marital status: Married             What year were you married? 1962      Do you live in a house, apartment, assisted living, condo, trailer? Apartment      Is it one or more stories? Yes      How many persons live in your home? 0      Do you have any pets in your home? 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 Yes            How often? Water aerobics       Do you have a living will? Yes      Do you have a DNR form? Yes           If not, do you want to discuss one?      Do you have signed POA/HPOA forms?  Yes             Outpatient Encounter Medications as of 03/05/2018  Medication Sig  .  allopurinol (ZYLOPRIM) 100 MG tablet TAKE 2 TABLETS BY MOUTH ONCE DAILY.  Marland Kitchen ALPRAZolam (XANAX) 0.25 MG tablet TAKE (1) TABLET TWICE A DAY AS NEEDED.  . bisoprolol-hydrochlorothiazide (ZIAC) 10-6.25 MG tablet TAKE 1 TABLET ONCE DAILY.    . clobetasol cream (TEMOVATE) 6.57 % Apply 1 application topically as needed.   . Esomeprazole Magnesium (NEXIUM 24HR PO) Take 1 tablet by mouth daily.  . Flaxseed, Linseed, (FLAXSEED OIL PO) Take 1 tablet by mouth daily.  Marland Kitchen FLUoxetine (PROZAC) 40 MG capsule TAKE (1) CAPSULE DAILY.  Marland Kitchen glucose blood (ACCU-CHEK SMARTVIEW) test strip Use to check blood sugar three times a day and PRN,  Dx code: E11.65  . Lancets (ACCU-CHEK SOFT TOUCH) lancets Use as instructed  . metFORMIN (GLUCOPHAGE) 500 MG tablet Take 1 tablet (500 mg total) by mouth 2 (two) times daily with a meal.  . Omega-3 Fatty Acids (FISH OIL) 1000 MG CAPS Take 1 capsule by mouth daily.    No facility-administered encounter medications on file as of 03/05/2018.     Activities of Daily Living In your present state of health, do you have any difficulty performing the following activities: 03/05/2018  Hearing? N  Vision? N  Difficulty concentrating or making decisions? N  Walking or climbing stairs? N  Dressing or bathing? N  Doing errands, shopping? N  Preparing Food and eating ? N  Using the Toilet? N  In the past six months, have you accidently leaked urine? N  Do you have problems with loss of bowel control? N  Managing your Medications? N  Managing your Finances? N  Housekeeping or managing your Housekeeping? N  Some recent data might be hidden    Patient Care Team: Blanchie Serve, MD (Inactive) as PCP - General (Internal Medicine) Iran Planas, MD as Consulting Physician (Orthopedic Surgery) Marygrace Drought, MD as Consulting Physician (Ophthalmology)    Assessment:   This is a routine wellness examination for Brittany Mcclure.  Exercise Activities and Dietary recommendations Current Exercise Habits: Home exercise routine, Type of exercise: Other - see comments;walking(swimming), Time (Minutes): 30, Frequency (Times/Week): 3, Weekly Exercise (Minutes/Week): 90, Intensity: Mild, Exercise limited by: None identified  Goals     . Patient Stated     Patient will start journaling and exploring self expressive practices.       Fall Risk Fall Risk  03/05/2018 01/18/2018 02/21/2017 06/12/2015 02/05/2015  Falls in the past year? 0 No Yes No No  Number falls in past yr: 0 - 2 or more - -  Injury with Fall? 0 - Yes - -   Is the patient's home free of loose throw rugs in walkways, pet beds, electrical cords, etc?   yes      Grab bars in the bathroom? yes      Handrails on the stairs?   yes      Adequate lighting?   yes  Depression Screen PHQ 2/9 Scores 03/05/2018 01/18/2018 02/21/2017 06/12/2015  PHQ - 2 Score 0 0 1 0     Cognitive Function MMSE - Mini Mental State Exam 03/05/2018 02/21/2017  Orientation to time 5 4  Orientation to Place 5 5  Registration 3 3  Attention/ Calculation 5 5  Recall 3 2  Language- name 2 objects 2 2  Language- repeat 1 1  Language- follow 3 step command 3 3  Language- read & follow direction 1 1  Write a sentence 1 1  Copy design 1 1  Total score 30 28  Immunization History  Administered Date(s) Administered  . Influenza Whole 12/27/2010  . Influenza, High Dose Seasonal PF 12/06/2017  . Influenza-Unspecified 10/20/2014, 12/19/2016  . Pneumococcal-Unspecified 09/19/2014  . Tdap 03/03/2015  . Zoster 09/19/2014  . Zoster Recombinat (Shingrix) 12/06/2017, 02/15/2018    Qualifies for Shingles Vaccine? Up to date, completed  Screening Tests Health Maintenance  Topic Date Due  . COLONOSCOPY  04/02/2017  . FOOT EXAM  11/15/2017  . MAMMOGRAM  06/21/2018  . HEMOGLOBIN A1C  07/11/2018  . OPHTHALMOLOGY EXAM  02/13/2019  . TETANUS/TDAP  03/02/2025  . INFLUENZA VACCINE  Completed  . DEXA SCAN  Completed  . PNA vac Low Risk Adult  Completed    Cancer Screenings: Lung: Low Dose CT Chest recommended if Age 69-80 years, 30 pack-year currently smoking OR have quit w/in 15years. Patient does not qualify. Breast:  Up to date on Mammogram? Yes   Up to date of Bone  Density/Dexa? Yes Colorectal: excluded, over age 33  Additional Screenings:  Hepatitis C Screening: declined     Plan:    I have personally reviewed and addressed the Medicare Annual Wellness questionnaire and have noted the following in the patient's chart:  A. Medical and social history B. Use of alcohol, tobacco or illicit drugs  C. Current medications and supplements D. Functional ability and status E.  Nutritional status F.  Physical activity G. Advance directives H. List of other physicians I.  Hospitalizations, surgeries, and ER visits in previous 12 months J.  [place] to include hearing, vision, cognitive, depression L. Referrals and appointments - none  In addition, I have reviewed and discussed with patient certain preventive protocols, quality metrics, and best practice recommendations. A written personalized care plan for preventive services as well as general preventive health recommendations were provided to patient.  See attached scanned questionnaire for additional information.   Signed,   Tyson Dense, RN Nurse Health Advisor  Patient Concerns: None

## 2018-03-29 ENCOUNTER — Other Ambulatory Visit: Payer: Self-pay | Admitting: Nurse Practitioner

## 2018-04-06 ENCOUNTER — Telehealth: Payer: Self-pay | Admitting: Internal Medicine

## 2018-04-06 NOTE — Telephone Encounter (Signed)
See note below and advise. 

## 2018-04-06 NOTE — Telephone Encounter (Signed)
Let's have her make an office visit to discuss with me in person At that time we will discuss decision on repeat colonoscopy together

## 2018-04-06 NOTE — Telephone Encounter (Signed)
Pt wants to know if Dr. Hilarie Fredrickson would recommend that pt has another colon given her age and medical history. I told her that she was due last year but she insisted in asking Dr. Hilarie Fredrickson. Pls call her back and leave a detailed message if she does not answer.

## 2018-04-06 NOTE — Telephone Encounter (Signed)
Spoke with pt and she is aware. Scheduled pt to see Dr. Hilarie Fredrickson 05/18/18@8 :45am, she is aware of appt.

## 2018-04-28 ENCOUNTER — Other Ambulatory Visit: Payer: Self-pay | Admitting: Nurse Practitioner

## 2018-05-07 ENCOUNTER — Encounter: Payer: Self-pay | Admitting: *Deleted

## 2018-05-18 ENCOUNTER — Ambulatory Visit: Payer: Medicare Other | Admitting: Internal Medicine

## 2018-05-18 ENCOUNTER — Encounter: Payer: Self-pay | Admitting: Internal Medicine

## 2018-05-18 VITALS — BP 120/84 | HR 78 | Ht 62.0 in | Wt 200.0 lb

## 2018-05-18 DIAGNOSIS — R1013 Epigastric pain: Secondary | ICD-10-CM

## 2018-05-18 DIAGNOSIS — R1012 Left upper quadrant pain: Secondary | ICD-10-CM

## 2018-05-18 DIAGNOSIS — Z86010 Personal history of colonic polyps: Secondary | ICD-10-CM

## 2018-05-18 DIAGNOSIS — Z800 Family history of malignant neoplasm of digestive organs: Secondary | ICD-10-CM

## 2018-05-18 DIAGNOSIS — Z8719 Personal history of other diseases of the digestive system: Secondary | ICD-10-CM

## 2018-05-18 DIAGNOSIS — Z8711 Personal history of peptic ulcer disease: Secondary | ICD-10-CM

## 2018-05-18 MED ORDER — ESOMEPRAZOLE MAGNESIUM 40 MG PO CPDR: 40.0000 mg | DELAYED_RELEASE_CAPSULE | Freq: Two times a day (BID) | ORAL | 0 refills | Status: DC

## 2018-05-18 NOTE — Progress Notes (Signed)
Subjective:    Patient ID: Brittany Mcclure, female    DOB: November 20, 1940, 78 y.o.   MRN: [id number]  HPI Brittany Mcclure is a 78 year old female with a history of adenomatous polyps, colonic diverticulosis, GERD, peptic ulcer disease, family history of colon cancer in her mother who is seen for follow-up.  She is here alone today.  She reports that she has been having some epigastric and left upper quadrant pain over the last several weeks.  This is worse at night and somewhat worse after eating.  No heartburn.  No dysphagia or odynophagia.  Not using NSAIDs.  Rarely uses Tylenol.  Taking Nexium 20 mg daily over-the-counter.  Separately about a month ago during the last week of January she developed what she felt may have been food poisoning and had diarrhea yellow in color.  This has resolved but she has had some abdominal bloating and flatulence since.  Bowels are mostly back to normal though at times are still loose.  No blood in her stool or melena.  She is interested in repeating upper endoscopy and lower endoscopy.  Review of Systems As per HPI, otherwise negative  Current Medications, Allergies, Past Medical History, Past Surgical History, Family History and Social History were reviewed in [employer] record.     Objective:   Physical Exam BP 120/84   Pulse 78   Ht 5\' 2"  (1.575 m)   Wt 200 lb (90.7 kg)   BMI 36.58 kg/m  Gen: awake, alert, NAD HEENT: anicteric, op clear CV: RRR, no mrg Pulm: CTA b/l Abd: soft, NT/ND, +BS throughout Ext: no c/c/e Neuro: nonfocal  CBC    Component Value Date/Time   WBC 9.3 11/22/2016 0740   RBC 4.05 11/22/2016 0740   HGB 12.3 11/22/2016 0740   HCT 35.8 11/22/2016 0740   PLT 266 11/22/2016 0740   MCV 88.4 11/22/2016 0740   MCH 30.4 11/22/2016 0740   MCHC 34.4 11/22/2016 0740   RDW 13.8 11/22/2016 0740   LYMPHSABS 2,046 11/22/2016 0740   MONOABS 651 11/22/2016 0740   EOSABS 279 11/22/2016 0740   BASOSABS 0  11/22/2016 0740   CMP     Component Value Date/Time   NA 139 01/09/2018 0800   K 4.3 01/09/2018 0800   CL 103 01/09/2018 0800   CO2 28 01/09/2018 0800   GLUCOSE 129 (H) 01/09/2018 0800   BUN 24 01/09/2018 0800   CREATININE 1.06 (H) 01/09/2018 0800   CALCIUM 9.3 01/09/2018 0800   PROT 6.4 01/09/2018 0800   ALBUMIN 4.0 11/22/2016 0740   AST 17 01/09/2018 0800   ALT 16 01/09/2018 0800   ALKPHOS 83 11/22/2016 0740   BILITOT 0.5 01/09/2018 0800   GFRNONAA 50 (L) 01/09/2018 0800   GFRAA 58 (L) 01/09/2018 0800      Assessment & Plan:  78 year old female with a history of adenomatous polyps, colonic diverticulosis, GERD, peptic ulcer disease, family history of colon cancer in her mother who is seen for follow-up.   1.  Epigastric pain/left upper quadrant pain/history of peptic ulcer disease --ongoing to increase PPI she is having ulcer-like symptoms.  She was H. pylori negative.  She is already avoiding NSAIDs.  Given that she has been on low-dose PPI I recommended we repeat upper endoscopy.  We discussed the risk, benefits and alternatives and she is agreeable and wishes to proceed.  Increase Nexium to 40 mg twice daily AC at least until endoscopy.  2.  History of adenomatous colon polyps  and family history of colon cancer --surveillance colonoscopy is indicated at this time.  We discussed the risk, benefits and alternatives and she is agreeable and wishes to proceed.  She may benefit from [facility] for postinfectious irritable bowel type symptom but we will wait until colonoscopy to determine if this may benefit.  25 minutes spent with the patient today. Greater than 50% was spent in counseling and coordination of care with the patient

## 2018-05-18 NOTE — Patient Instructions (Addendum)
You have been scheduled for an endoscopy and colonoscopy. Please follow the written instructions given to you at your visit today. Please pick up your prep supplies at the pharmacy within the next 1-3 days. If you use inhalers (even only as needed), please bring them with you on the day of your procedure. Your physician has requested that you go to www.startemmi.com and enter the access code given to you at your visit today. This web site gives a general overview about your procedure. However, you should still follow specific instructions given to you by our office regarding your preparation for the procedure.  We have sent the following medications to your pharmacy for you to pick up at your convenience: Nexium 40 mg twice daily before meals x 1 month  If you are age 21 or older, your body mass index should be between 23-30. Your Body mass index is 36.58 kg/m. If this is out of the aforementioned range listed, please consider follow up with your Primary Care Provider.  If you are age 47 or younger, your body mass index should be between 19-25. Your Body mass index is 36.58 kg/m. If this is out of the aformentioned range listed, please consider follow up with your Primary Care Provider.

## 2018-05-22 ENCOUNTER — Encounter: Payer: Self-pay | Admitting: Internal Medicine

## 2018-05-22 ENCOUNTER — Other Ambulatory Visit: Payer: Self-pay

## 2018-05-22 ENCOUNTER — Ambulatory Visit (AMBULATORY_SURGERY_CENTER): Payer: Medicare Other | Admitting: Internal Medicine

## 2018-05-22 VITALS — BP 157/49 | HR 62 | Temp 98.0°F | Resp 12 | Ht 62.0 in | Wt 200.0 lb

## 2018-05-22 DIAGNOSIS — K449 Diaphragmatic hernia without obstruction or gangrene: Secondary | ICD-10-CM | POA: Diagnosis not present

## 2018-05-22 DIAGNOSIS — K2970 Gastritis, unspecified, without bleeding: Secondary | ICD-10-CM

## 2018-05-22 DIAGNOSIS — R1012 Left upper quadrant pain: Secondary | ICD-10-CM

## 2018-05-22 DIAGNOSIS — K635 Polyp of colon: Secondary | ICD-10-CM | POA: Diagnosis not present

## 2018-05-22 DIAGNOSIS — Z860100 Personal history of colon polyps, unspecified: Secondary | ICD-10-CM

## 2018-05-22 DIAGNOSIS — D122 Benign neoplasm of ascending colon: Secondary | ICD-10-CM

## 2018-05-22 DIAGNOSIS — R1013 Epigastric pain: Secondary | ICD-10-CM

## 2018-05-22 DIAGNOSIS — K5140 Inflammatory polyps of colon without complications: Secondary | ICD-10-CM

## 2018-05-22 MED ORDER — SODIUM CHLORIDE 0.9 % IV SOLN: 500.0000 mL | Freq: Once | INTRAVENOUS | Status: DC

## 2018-05-22 NOTE — Progress Notes (Signed)
Report to PACU, RN, vss, BBS= Clear.  

## 2018-05-22 NOTE — Progress Notes (Signed)
Called to room to assist during endoscopic procedure.  Patient ID and intended procedure confirmed with present staff. Received instructions for my participation in the procedure from the performing physician.  

## 2018-05-22 NOTE — Patient Instructions (Signed)
YOU HAD AN ENDOSCOPIC PROCEDURE TODAY AT [facility]:   Refer to the procedure report that was given to you for any specific questions about what was found during the examination.  If the procedure report does not answer your questions, please call your gastroenterologist to clarify.  If you requested that your care partner not be given the details of your procedure findings, then the procedure report has been included in a sealed envelope for you to review at your convenience later.  YOU SHOULD EXPECT: Some feelings of bloating in the abdomen. Passage of more gas than usual.  Walking can help get rid of the air that was put into your GI tract during the procedure and reduce the bloating. If you had a lower endoscopy (such as a colonoscopy or flexible sigmoidoscopy) you may notice spotting of blood in your stool or on the toilet paper. If you underwent a bowel prep for your procedure, you may not have a normal bowel movement for a few days.  Please Note:  You might notice some irritation and congestion in your nose or some drainage.  This is from the oxygen used during your procedure.  There is no need for concern and it should clear up in a day or so.  SYMPTOMS TO REPORT IMMEDIATELY:   Following lower endoscopy (colonoscopy or flexible sigmoidoscopy):  Excessive amounts of blood in the stool  Significant tenderness or worsening of abdominal pains  Swelling of the abdomen that is new, acute  Fever of 100F or higher   Following upper endoscopy (EGD)  Vomiting of blood or coffee ground material  New chest pain or pain under the shoulder blades  Painful or persistently difficult swallowing  New shortness of breath  Fever of 100F or higher  Black, tarry-looking stools  For urgent or emergent issues, a gastroenterologist can be reached at any hour by calling [phone].   DIET:  We do recommend a small meal at first, but then you may proceed to your regular diet.  Drink  plenty of fluids but you should avoid alcoholic beverages for 24 hours.  ACTIVITY:  You should plan to take it easy for the rest of today and you should NOT DRIVE or use heavy machinery until tomorrow (because of the sedation medicines used during the test).    FOLLOW UP: Our staff will call the number listed on your records the next business day following your procedure to check on you and address any questions or concerns that you may have regarding the information given to you following your procedure. If we do not reach you, we will leave a message.  However, if you are feeling well and you are not experiencing any problems, there is no need to return our call.  We will assume that you have returned to your regular daily activities without incident.  If any biopsies were taken you will be contacted by phone or by letter within the next 1-3 weeks.  Please call us at [phone] if you have not heard about the biopsies in 3 weeks.   Await for biopsy results Polyps (handout given) Gastritis (handout given) Diverticulosis (handout given) Office will call you for a follow up appointment in May, if you haven't heard from office for a follow by mid April please call the office.  SIGNATURES/CONFIDENTIALITY: You and/or your care partner have signed paperwork which will be entered into your electronic medical record.  These signatures attest to the fact that that the information  above on your After Visit Summary has been reviewed and is understood.  Full responsibility of the confidentiality of this discharge information lies with you and/or your care-partner. 

## 2018-05-23 ENCOUNTER — Telehealth: Payer: Self-pay

## 2018-05-23 NOTE — Telephone Encounter (Signed)
  Follow up Call-  Call back number 05/22/2018  Post procedure Call Back phone  # [phone]  Permission to leave phone message Yes  Some recent data might be hidden     Patient questions:  Do you have a fever, pain , or abdominal swelling? No. Pain Score  0 *  Have you tolerated food without any problems? Yes.    Have you been able to return to your normal activities? Yes.    Do you have any questions about your discharge instructions: Diet   No. Medications  No. Follow up visit  No.  Do you have questions or concerns about your Care? No.  Actions: * If pain score is 4 or above: No action needed, pain <4.

## 2018-05-23 NOTE — Op Note (Signed)
[facility] Patient Name: Brittany Mcclure Procedure Date: 05/22/2018 2:00 PM MRN: [id number] Endoscopist: Jerene Bears , MD Age: 78 Referring MD:  Date of Birth: 1940-11-05 Gender: Female Account #: [id number] Procedure:                Upper GI endoscopy Indications:              Epigastric abdominal pain, Abdominal pain in the                            left upper quadrant Medicines:                Monitored Anesthesia Care Procedure:                Pre-Anesthesia Assessment:                           - Prior to the procedure, a History and Physical                            was performed, and patient medications and                            allergies were reviewed. The patient's tolerance of                            previous anesthesia was also reviewed. The risks                            and benefits of the procedure and the sedation                            options and risks were discussed with the patient.                            All questions were answered, and informed consent                            was obtained. Prior Anticoagulants: The patient has                            taken no previous anticoagulant or antiplatelet                            agents. ASA Grade Assessment: III - A patient with                            severe systemic disease. After reviewing the risks                            and benefits, the patient was deemed in                            satisfactory condition to undergo the procedure.  After obtaining informed consent, the endoscope was                            passed under direct vision. Throughout the                            procedure, the patient's blood pressure, pulse, and                            oxygen saturations were monitored continuously. The                            Endoscope was introduced through the mouth, and                            advanced to the second part of  duodenum. The upper                            GI endoscopy was accomplished without difficulty.                            The patient tolerated the procedure well. Scope In: Scope Out: Findings:                 A 4 cm hiatal hernia was present.                           Normal mucosa was found in the entire esophagus.                           Patchy moderate inflammation characterized by                            erythema and linear erosions was found in the                            gastric fundus and in the gastric body (most                            prominent on the gastric rugae). Biopsies were                            taken with a cold forceps for histology and                            Helicobacter pylori testing.                           The examined duodenum was normal. Complications:            No immediate complications. Estimated Blood Loss:     Estimated blood loss was minimal. Impression:               - 4 cm hiatal hernia.                           -  Normal mucosa was found in the entire esophagus.                           - Erosive gastritis. Biopsied.                           - Normal examined duodenum. Recommendation:           - Patient has a contact number available for                            emergencies. The signs and symptoms of potential                            delayed complications were discussed with the                            patient. Return to normal activities tomorrow.                            Written discharge instructions were provided to the                            patient.                           - Resume previous diet.                           - Continue present medications.                           - Await pathology results. Jerene Bears, MD 05/22/2018 2:36:46 PM This report has been signed electronically.

## 2018-05-23 NOTE — Op Note (Signed)
[facility] Patient Name: Brittany Mcclure Procedure Date: 05/22/2018 2:00 PM MRN: [id number] Endoscopist: Jerene Bears , MD Age: 78 Referring MD:  Date of Birth: 05/25/1940 Gender: Female Account #: [id number] Procedure:                Colonoscopy Indications:              High risk colon cancer surveillance: Personal                            history of multiple (3 or more) adenomas, Personal                            history of sessile serrated colon polyp (less than                            10 mm in size) with no dysplasia, Last colonoscopy:                            2016; family history of colon cancer in patient's                            mother Medicines:                Monitored Anesthesia Care Procedure:                Pre-Anesthesia Assessment:                           - Prior to the procedure, a History and Physical                            was performed, and patient medications and                            allergies were reviewed. The patient's tolerance of                            previous anesthesia was also reviewed. The risks                            and benefits of the procedure and the sedation                            options and risks were discussed with the patient.                            All questions were answered, and informed consent                            was obtained. Prior Anticoagulants: The patient has                            taken no previous anticoagulant or antiplatelet  agents. ASA Grade Assessment: III - A patient with                            severe systemic disease. After reviewing the risks                            and benefits, the patient was deemed in                            satisfactory condition to undergo the procedure.                           After obtaining informed consent, the colonoscope                            was passed under direct vision. Throughout the                             procedure, the patient's blood pressure, pulse, and                            oxygen saturations were monitored continuously. The                            Colonoscope was introduced through the anus and                            advanced to the cecum, identified by appendiceal                            orifice and ileocecal valve. The colonoscopy was                            performed without difficulty. The patient tolerated                            the procedure well. The quality of the bowel                            preparation was good. The ileocecal valve,                            appendiceal orifice, and rectum were photographed. Scope In: 2:15:15 PM Scope Out: 2:28:50 PM Scope Withdrawal Time: 0 hours 10 minutes 46 seconds  Total Procedure Duration: 0 hours 13 minutes 35 seconds  Findings:                 The digital rectal exam was normal.                           A 3 mm polyp was found in the ascending colon. The                            polyp was sessile. The polyp  was removed with a                            cold snare. Resection and retrieval were complete.                           Multiple small and large-mouthed diverticula were                            found in the sigmoid colon and descending colon.                           The exam was otherwise without abnormality on                            direct and retroflexion views. Complications:            No immediate complications. Estimated Blood Loss:     Estimated blood loss was minimal. Impression:               - One 3 mm polyp in the ascending colon, removed                            with a cold snare. Resected and retrieved.                           - Diverticulosis in the sigmoid colon and in the                            descending colon.                           - The examination was otherwise normal on direct                            and retroflexion  views. Recommendation:           - Patient has a contact number available for                            emergencies. The signs and symptoms of potential                            delayed complications were discussed with the                            patient. Return to normal activities tomorrow.                            Written discharge instructions were provided to the                            patient.                           - Resume previous diet.                           -  Continue present medications.                           - Await pathology results.                           - You will likely not need repeat colonoscopy for                            surveillance based on age. Jerene Bears, MD 05/22/2018 2:40:01 PM This report has been signed electronically.

## 2018-05-28 ENCOUNTER — Other Ambulatory Visit: Payer: Self-pay

## 2018-05-28 MED ORDER — SUCRALFATE 1 GM/10ML PO SUSP: 1.0000 g | Freq: Four times a day (QID) | ORAL | 1 refills | Status: DC

## 2018-05-29 ENCOUNTER — Other Ambulatory Visit: Payer: Medicare Other

## 2018-05-29 ENCOUNTER — Telehealth: Payer: Self-pay | Admitting: Internal Medicine

## 2018-05-29 DIAGNOSIS — E785 Hyperlipidemia, unspecified: Secondary | ICD-10-CM

## 2018-05-29 LAB — COMPLETE METABOLIC PANEL WITH GFR
AG Ratio: 1.8 (calc) (ref 1.0–2.5)
ALT: 15 U/L (ref 6–29)
AST: 16 U/L (ref 10–35)
Albumin: 4 g/dL (ref 3.6–5.1)
Alkaline phosphatase (APISO): 99 U/L (ref 37–153)
BUN/Creatinine Ratio: 21 (calc) (ref 6–22)
BUN: 27 mg/dL — ABNORMAL HIGH (ref 7–25)
CO2: 26 mmol/L (ref 20–32)
Calcium: 9.1 mg/dL (ref 8.6–10.4)
Chloride: 100 mmol/L (ref 98–110)
Creat: 1.27 mg/dL — ABNORMAL HIGH (ref 0.60–0.93)
GFR, Est African American: 46 mL/min/{1.73_m2} — ABNORMAL LOW (ref >=60)
GFR, Est Non African American: 40 mL/min/{1.73_m2} — ABNORMAL LOW (ref >=60)
Globulin: 2.2 g/dL (calc) (ref 1.9–3.7)
Glucose, Bld: 106 mg/dL — ABNORMAL HIGH (ref 65–99)
Potassium: 4.4 mmol/L (ref 3.5–5.3)
Sodium: 136 mmol/L (ref 135–146)
Total Bilirubin: 0.4 mg/dL (ref 0.2–1.2)
Total Protein: 6.2 g/dL (ref 6.1–8.1)

## 2018-05-29 LAB — LIPID PANEL
Cholesterol: 239 mg/dL — ABNORMAL HIGH (ref <200)
HDL: 43 mg/dL — ABNORMAL LOW (ref >=50)
LDL Cholesterol (Calc): 153 mg/dL (calc) — ABNORMAL HIGH
Non-HDL Cholesterol (Calc): 196 mg/dL (calc) — ABNORMAL HIGH (ref <130)
Total CHOL/HDL Ratio: 5.6 (calc) — ABNORMAL HIGH (ref <5.0)
Triglycerides: 265 mg/dL — ABNORMAL HIGH (ref <150)

## 2018-05-29 NOTE — Telephone Encounter (Signed)
See result note.  

## 2018-05-29 NOTE — Telephone Encounter (Signed)
Pt is returning your call from yesterday afternoon about the pathology results.

## 2018-05-31 ENCOUNTER — Encounter: Payer: Self-pay | Admitting: Nurse Practitioner

## 2018-06-14 ENCOUNTER — Other Ambulatory Visit: Payer: Self-pay

## 2018-06-14 ENCOUNTER — Non-Acute Institutional Stay: Payer: Medicare Other | Admitting: Nurse Practitioner

## 2018-06-14 ENCOUNTER — Encounter: Payer: Self-pay | Admitting: Nurse Practitioner

## 2018-06-14 DIAGNOSIS — E1169 Type 2 diabetes mellitus with other specified complication: Secondary | ICD-10-CM

## 2018-06-14 DIAGNOSIS — E785 Hyperlipidemia, unspecified: Secondary | ICD-10-CM

## 2018-06-14 DIAGNOSIS — E669 Obesity, unspecified: Secondary | ICD-10-CM

## 2018-06-14 DIAGNOSIS — M1A4410 Other secondary chronic gout, right hand, without tophus (tophi): Secondary | ICD-10-CM

## 2018-06-14 DIAGNOSIS — K219 Gastro-esophageal reflux disease without esophagitis: Secondary | ICD-10-CM

## 2018-06-14 DIAGNOSIS — F419 Anxiety disorder, unspecified: Secondary | ICD-10-CM

## 2018-06-14 DIAGNOSIS — N183 Chronic kidney disease, stage 3 unspecified: Secondary | ICD-10-CM

## 2018-06-14 NOTE — Assessment & Plan Note (Addendum)
Feeling better, less GI symptoms, will continue Nexium, Carafate. Update CBC/diff in setting of hx of GI bleed.

## 2018-06-14 NOTE — Assessment & Plan Note (Addendum)
Persists, encourage diet and exercise, cholesterol lowing meds were not tolerated. Update lipid panel.

## 2018-06-14 NOTE — Patient Instructions (Signed)
CBC/diff, CMP/eGFR, lipid panel, uric acid level, Hgb a1c prior to the next appointment. Next appt:  6 months.

## 2018-06-14 NOTE — Assessment & Plan Note (Addendum)
Stable, continue Allopurinol 100mg  qd. Update uric acid level.

## 2018-06-14 NOTE — Assessment & Plan Note (Addendum)
Blood sugar is controlled, continue Metformin 567m qd. Update Hgb a1c, lipid panel, CBC/diff, CMP/eGFR

## 2018-06-14 NOTE — Progress Notes (Signed)
Location:   clinic [place]   Place of Service:  Clinic (12) Provider: Marlana Latus NP  Code Status: DNR Goals of Care: [place] 03/05/2018  Does Patient Have a Medical Advance Directive? Yes  Type of Paramedic of [place];Living will;Out of facility DNR (pink MOST or yellow form)  Does patient want to make changes to medical advance directive? No - Patient declined  Copy of [place] in Chart? Yes - validated most recent copy scanned in chart (See row information)  Pre-existing out of facility DNR order (yellow form or pink MOST form) Yellow form placed in chart (order not valid for inpatient use);Pink MOST form placed in chart (order not valid for inpatient use)     Chief Complaint  Patient presents with  . Medical Management of Chronic Issues    5 mo fu w/labs    HPI: Patient is a 78 y.o. female seen today for medical management of chronic diseases.       The patient has GERD, recently underwent GI, Dr.  Gemma Payor, on Nexium 76m qd, Carafate 1gm qid. T2DM, controlled blood sugar Metformin 5033mqd. Her mood is stable on Fluoxetine 4080md, Alprazolam 0.55m67m. Gout, stable, no flare up, on Allopurinol uncontrolled lipid panel, unable to tolerate cholesterol lowering agents. CKD, creat 1.27 05/29/18  Past Medical History:  Diagnosis Date  . Allergy   . Anemia   . Anxiety   . Arthritis    with gout  . Carotid bruit    right  . Cataract    bil cataracts removed  . Chest pain   . Chronic kidney disease   . Colon polyps   . Depression   . Diabetes type 2, uncontrolled (HCC)[place]. Diverticulosis   . Duodenitis   . Exogenous obesity   . Gastric ulcer   . GERD (gastroesophageal reflux disease)   . Hyperlipidemia   . Hypertension   . Metabolic syndrome   . Osteopenia   . Seasonal allergies    seasonal  . Tubular adenoma of colon     Past Surgical History:  Procedure Laterality Date  . COLONOSCOPY     11/90,11/91,1994,2000,2010  . DILATION AND CURETTAGE OF UTERUS    . DILATION AND CURETTAGE OF UTERUS  1997  . FOOT SURGERY     x 2  . HAND SURGERY Right    Carpel Tunnel  . POLYPECTOMY    . TUBAL LIGATION    . uterine polyps removed     x2    Allergies  Allergen Reactions  . Crestor [Rosuvastatin Calcium]     Myalgias   . Gemfibrozil Itching  . Iodine     Skin rash  . Lipitor [Atorvastatin Calcium]     Myalgias   . Lovastatin     myalgias  . Sulfonamide Derivatives     REACTION: n\T\v  . Sulfur     Made infection worse  . Zetia [Ezetimibe]     Cough, dry    Allergies as of 06/14/2018      Reactions   Crestor [rosuvastatin Calcium]    Myalgias   Gemfibrozil Itching   Iodine    Skin rash   Lipitor [atorvastatin Calcium]    Myalgias   Lovastatin    myalgias   Sulfonamide Derivatives    REACTION: n\T\v   Sulfur    Made infection worse   Zetia [ezetimibe]    Cough, dry      Medication List  Accurate as of June 14, 2018 11:59 PM. Always use your most recent med list.        accu-chek soft touch lancets Use as instructed   allopurinol 100 MG tablet Commonly known as:  ZYLOPRIM TAKE 2 TABLETS BY MOUTH ONCE DAILY.   ALPRAZolam 0.25 MG tablet Commonly known as:  XANAX Take 0.25 mg by mouth daily.   bisoprolol-hydrochlorothiazide 10-6.25 MG tablet Commonly known as:  ZIAC TAKE 1 TABLET ONCE DAILY.   clobetasol cream 0.05 % Commonly known as:  TEMOVATE Apply 1 application topically as needed.   esomeprazole 40 MG capsule Commonly known as:  NexIUM Take 1 capsule (40 mg total) by mouth 2 (two) times daily before a meal.   Fish Oil 1000 MG Caps Take 1 capsule by mouth daily.   FLUoxetine 40 MG capsule Commonly known as:  PROZAC TAKE (1) CAPSULE DAILY.   glucose blood test strip Commonly known as:  Accu-Chek SmartView Use to check blood sugar three times a day and PRN,  Dx code: E11.65   metFORMIN 500 MG tablet Commonly known as:   GLUCOPHAGE Take 500 mg by mouth daily.   simethicone 125 MG chewable tablet Commonly known as:  MYLICON Chew 782 mg by mouth as needed for flatulence.   sucralfate 1 GM/10ML suspension Commonly known as:  CARAFATE Take 10 mLs (1 g total) by mouth 4 (four) times daily.       Review of Systems:  Review of Systems  Constitutional: Negative for activity change, appetite change, chills, diaphoresis, fatigue, fever and unexpected weight change.  HENT: Positive for hearing loss. Negative for congestion and voice change.   Respiratory: Negative for cough, shortness of breath and wheezing.   Cardiovascular: Negative for chest pain, palpitations and leg swelling.  Gastrointestinal: Negative for abdominal distention, abdominal pain, constipation, diarrhea, nausea and vomiting.  Genitourinary: Negative for difficulty urinating and urgency.  Musculoskeletal: Positive for gait problem.  Skin: Negative for color change and pallor.  Neurological: Negative for dizziness, speech difficulty, weakness and headaches.  Psychiatric/Behavioral: Positive for sleep disturbance. Negative for agitation, behavioral problems and hallucinations. The patient is nervous/anxious.     Health Maintenance  Topic Date Due  . FOOT EXAM  11/15/2017  . MAMMOGRAM  06/21/2018  . HEMOGLOBIN A1C  07/11/2018  . OPHTHALMOLOGY EXAM  02/13/2019  . TETANUS/TDAP  03/02/2025  . INFLUENZA VACCINE  Completed  . DEXA SCAN  Completed  . PNA vac Low Risk Adult  Completed    Physical Exam: Vitals:   06/14/18 1401  BP: 122/72  Pulse: 75  Resp: 18  Temp: 97.7 F (36.5 C)  TempSrc: Oral  SpO2: 97%  Weight: 197 lb 6.4 oz (89.5 kg)  Height: '5\' 2"'  (1.575 m)   Body mass index is 36.1 kg/m. Physical Exam Constitutional:      Appearance: Normal appearance.  HENT:     Head: Normocephalic and atraumatic.     Nose: Nose normal.     Mouth/Throat:     Mouth: Mucous membranes are moist.  Eyes:     Extraocular Movements:  Extraocular movements intact.     Pupils: Pupils are equal, round, and reactive to light.  Neck:     Musculoskeletal: Normal range of motion and neck supple.  Cardiovascular:     Rate and Rhythm: Normal rate and regular rhythm.     Heart sounds: No murmur.  Pulmonary:     Breath sounds: Normal breath sounds. No wheezing, rhonchi or rales.  Abdominal:  General: There is no distension.     Palpations: Abdomen is soft.     Tenderness: There is no abdominal tenderness. There is no guarding or rebound.  Musculoskeletal:     Right lower leg: No edema.     Left lower leg: No edema.  Skin:    General: Skin is warm and dry.  Neurological:     General: No focal deficit present.     Mental Status: She is alert. Mental status is at baseline.     Cranial Nerves: No cranial nerve deficit.     Motor: No weakness.     Coordination: Coordination normal.     Gait: Gait normal.  Psychiatric:        Mood and Affect: Mood normal.        Behavior: Behavior normal.        Thought Content: Thought content normal.        Judgment: Judgment normal.     Labs reviewed: Basic Metabolic Panel: Recent Labs    10/12/17 0000 01/09/18 0800 05/29/18 0705  NA 135 139 136  K 4.7 4.3 4.4  CL 100 103 100  CO2 '26 28 26  ' GLUCOSE 125* 129* 106*  BUN 29* 24 27*  CREATININE 1.15* 1.06* 1.27*  CALCIUM 9.3 9.3 9.1  TSH  --  1.85  --    Liver Function Tests: Recent Labs    10/12/17 0000 01/09/18 0800 05/29/18 0705  AST '17 17 16  ' ALT '15 16 15  ' BILITOT 0.6 0.5 0.4  PROT 6.5 6.4 6.2   No results for input(s): LIPASE, AMYLASE in the last 8760 hours. No results for input(s): AMMONIA in the last 8760 hours. CBC: No results for input(s): WBC, NEUTROABS, HGB, HCT, MCV, PLT in the last 8760 hours. Lipid Panel: Recent Labs    10/12/17 0000 01/09/18 0800 05/29/18 0705  CHOL 254* 251* 239*  HDL 50* 51 43*  LDLCALC 164* 166* 153*  TRIG 235* 181* 265*  CHOLHDL 5.1* 4.9 5.6*   Lab Results   Component Value Date   HGBA1C 6.7 (H) 01/09/2018    Procedures since last visit: No results found.  Assessment/Plan  Diabetes mellitus type 2 in obese (HCC) Blood sugar is controlled, continue Metformin 547m qd. Update Hgb a1c, lipid panel, CBC/diff, CMP/eGFR  CKD (chronic kidney disease) stage 3, GFR 30-59 ml/min Gradual progressing creat 1.27 05/29/18, encourage oral fluid intake, update CMP/eGFR   Dyslipidemia Persists, encourage diet and exercise, cholesterol lowing meds were not tolerated. Update lipid panel.   Chronic GERD Feeling better, less GI symptoms, will continue Nexium, Carafate. Update CBC/diff in setting of hx of GI bleed.   Anxiety Her mood is stable, continue Fluoxetine 475mqd, Alprazolam 0.2535md.   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 Update uric acid level.    Labs/tests ordered: CBC/diff, CMP/eGFR, lipid panel, uric acid level, Hgb a1c prior to the next appointment.    Next appt:  6 months.

## 2018-06-14 NOTE — Assessment & Plan Note (Signed)
Her mood is stable, continue Fluoxetine 40mg  qd, Alprazolam 0.25mg  qd.

## 2018-06-14 NOTE — Assessment & Plan Note (Signed)
Gradual progressing creat 1.27 05/29/18, encourage oral fluid intake, update CMP/eGFR

## 2018-06-23 ENCOUNTER — Other Ambulatory Visit: Payer: Self-pay | Admitting: Nurse Practitioner

## 2018-06-23 ENCOUNTER — Other Ambulatory Visit: Payer: Self-pay | Admitting: Internal Medicine

## 2018-06-25 ENCOUNTER — Other Ambulatory Visit: Payer: Self-pay | Admitting: *Deleted

## 2018-06-25 MED ORDER — ALPRAZOLAM 0.25 MG PO TABS: ORAL_TABLET | ORAL | 0 refills | Status: DC

## 2018-08-10 ENCOUNTER — Other Ambulatory Visit: Payer: Self-pay | Admitting: Nurse Practitioner

## 2018-08-24 ENCOUNTER — Other Ambulatory Visit: Payer: Self-pay | Admitting: *Deleted

## 2018-08-24 MED ORDER — METFORMIN HCL 500 MG PO TABS: 500.0000 mg | ORAL_TABLET | Freq: Every day | ORAL | 1 refills | Status: DC

## 2018-08-24 NOTE — Telephone Encounter (Signed)
[facility]  

## 2018-09-27 ENCOUNTER — Other Ambulatory Visit: Payer: Self-pay | Admitting: *Deleted

## 2018-09-27 MED ORDER — BISOPROLOL-HYDROCHLOROTHIAZIDE 10-6.25 MG PO TABS: 1.0000 | ORAL_TABLET | Freq: Every day | ORAL | 0 refills | Status: DC

## 2018-09-27 MED ORDER — ALPRAZOLAM 0.25 MG PO TABS: ORAL_TABLET | ORAL | 0 refills | Status: DC

## 2018-09-27 NOTE — Telephone Encounter (Signed)
Received fax from [facility] requesting refill  Pended and sent to [facility] for approval.

## 2018-10-08 ENCOUNTER — Other Ambulatory Visit: Payer: Self-pay | Admitting: Internal Medicine

## 2018-11-05 ENCOUNTER — Other Ambulatory Visit: Payer: Self-pay | Admitting: Nurse Practitioner

## 2018-11-06 ENCOUNTER — Other Ambulatory Visit: Payer: Self-pay | Admitting: Nurse Practitioner

## 2018-11-08 ENCOUNTER — Other Ambulatory Visit: Payer: Self-pay | Admitting: Nurse Practitioner

## 2018-12-26 ENCOUNTER — Other Ambulatory Visit: Payer: Self-pay | Admitting: Nurse Practitioner

## 2019-01-01 ENCOUNTER — Other Ambulatory Visit: Payer: Medicare Other

## 2019-01-01 ENCOUNTER — Telehealth: Payer: Self-pay

## 2019-01-01 ENCOUNTER — Other Ambulatory Visit: Payer: Self-pay

## 2019-01-01 DIAGNOSIS — E1169 Type 2 diabetes mellitus with other specified complication: Secondary | ICD-10-CM

## 2019-01-01 DIAGNOSIS — E669 Obesity, unspecified: Secondary | ICD-10-CM

## 2019-01-01 DIAGNOSIS — E785 Hyperlipidemia, unspecified: Secondary | ICD-10-CM

## 2019-01-01 DIAGNOSIS — K219 Gastro-esophageal reflux disease without esophagitis: Secondary | ICD-10-CM

## 2019-01-01 DIAGNOSIS — N183 Chronic kidney disease, stage 3 unspecified: Secondary | ICD-10-CM

## 2019-01-01 DIAGNOSIS — M1A4410 Other secondary chronic gout, right hand, without tophus (tophi): Secondary | ICD-10-CM

## 2019-01-01 NOTE — Telephone Encounter (Signed)
Confirmed with patient she will get labs done on the 15th.

## 2019-01-01 NOTE — Telephone Encounter (Signed)
Per Brittany Mcclure, patient did not show for labs. Apnt is this Thursday.   Called patient. LMOM to return call to reschedule.

## 2019-01-03 ENCOUNTER — Encounter: Payer: Self-pay | Admitting: Nurse Practitioner

## 2019-01-03 ENCOUNTER — Non-Acute Institutional Stay: Payer: Medicare Other | Admitting: Nurse Practitioner

## 2019-01-03 ENCOUNTER — Other Ambulatory Visit: Payer: Self-pay

## 2019-01-03 DIAGNOSIS — K219 Gastro-esophageal reflux disease without esophagitis: Secondary | ICD-10-CM

## 2019-01-03 DIAGNOSIS — I10 Essential (primary) hypertension: Secondary | ICD-10-CM | POA: Diagnosis not present

## 2019-01-03 DIAGNOSIS — F419 Anxiety disorder, unspecified: Secondary | ICD-10-CM

## 2019-01-03 DIAGNOSIS — E119 Type 2 diabetes mellitus without complications: Secondary | ICD-10-CM

## 2019-01-03 DIAGNOSIS — E669 Obesity, unspecified: Secondary | ICD-10-CM

## 2019-01-03 DIAGNOSIS — E1169 Type 2 diabetes mellitus with other specified complication: Secondary | ICD-10-CM | POA: Diagnosis not present

## 2019-01-03 DIAGNOSIS — M1A00X0 Idiopathic chronic gout, unspecified site, without tophus (tophi): Secondary | ICD-10-CM

## 2019-01-03 DIAGNOSIS — N183 Chronic kidney disease, stage 3 unspecified: Secondary | ICD-10-CM

## 2019-01-03 NOTE — Assessment & Plan Note (Signed)
Chronic, hand, great toe, no flare up for a while, continue Allopurinol 100mg  qd. Update uric acid.

## 2019-01-03 NOTE — Assessment & Plan Note (Signed)
Stable, continue Nexium 40mg qd.  

## 2019-01-03 NOTE — Patient Instructions (Addendum)
Continue diet, exercise life style modification. Will CBC/diff, CMP/eGFR, uric acid, lipid panel, TSH, Hgb a1c prior to the Next appt:  6 months  

## 2019-01-03 NOTE — Progress Notes (Addendum)
Location:   clinic [place]   Place of Service:  Clinic (12) Provider: Marlana Latus NP  Code Status: DNR Goals of Care: [place] Advanced Directives 03/05/2018  Does Patient Have a Medical Advance Directive? Yes  Type of Paramedic of [place];Living will;Out of facility DNR (pink MOST or yellow form)  Does patient want to make changes to medical advance directive? No - Patient declined  Copy of [place] in Chart? Yes - validated most recent copy scanned in chart (See row information)  Pre-existing out of facility DNR order (yellow form or pink MOST form) Yellow form placed in chart (order not valid for inpatient use);Pink MOST form placed in chart (order not valid for inpatient use)     Chief Complaint  Patient presents with  . Medical Management of Chronic Issues    6 month follow up. Patient had labs done this morning.   Marland Kitchen Health Maintenance    Patient got her flu shot last week. Mammogram and foot exam.     HPI: Patient is a 78 y.o. female seen today for medical management of chronic diseases.    The patient has hx of T2DM, Hgb a1c 6.4 01/01/19 on Metformin 538m qd. No acute gout flare ups, uric acid 4.9 01/01/19, on Allopurinol 1037mqd. Her mood is stable, on Alprazolam 0.2560mid prn, Prozac 22m43m. GERD, stable on Nexium 22mg25m HTN, blood pressure is controlled, on Ziac 10/6.25mg qd. LDL 155, eGFR 44, Bun/creat 1.16 01/01/19     Past Medical History:  Diagnosis Date  . Allergy   . Anemia   . Anxiety   . Arthritis    with gout  . Carotid bruit    right  . Cataract    bil cataracts removed  . Chest pain   . Chronic kidney disease   . Colon polyps   . Depression   . Diabetes type 2, uncontrolled (HCC) [place] Diverticulosis   . Duodenitis   . Exogenous obesity   . Gastric ulcer   . GERD (gastroesophageal reflux disease)   . Hyperlipidemia   . Hypertension   . Metabolic syndrome   . Osteopenia   . Seasonal allergies     seasonal  . Tubular adenoma of colon     Past Surgical History:  Procedure Laterality Date  . COLONOSCOPY     11/90,11/91,1994,2000,2010  . DILATION AND CURETTAGE OF UTERUS    . DILATION AND CURETTAGE OF UTERUS  1997  . FOOT SURGERY     x 2  . HAND SURGERY Right    Carpel Tunnel  . POLYPECTOMY    . TUBAL LIGATION    . uterine polyps removed     x2    Allergies  Allergen Reactions  . Crestor [Rosuvastatin Calcium]     Myalgias   . Gemfibrozil Itching  . Iodine     Skin rash  . Lipitor [Atorvastatin Calcium]     Myalgias   . Lovastatin     myalgias  . Sulfonamide Derivatives     REACTION: n\T\v  . Sulfur     Made infection worse  . Zetia [Ezetimibe]     Cough, dry    Allergies as of 01/03/2019      Reactions   Crestor [rosuvastatin Calcium]    Myalgias   Gemfibrozil Itching   Iodine    Skin rash   Lipitor [atorvastatin Calcium]    Myalgias   Lovastatin    myalgias  Sulfonamide Derivatives    REACTION: n\T\v   Sulfur    Made infection worse   Zetia [ezetimibe]    Cough, dry      Medication List       Accurate as of January 03, 2019 11:59 PM. If you have any questions, ask your nurse or doctor.        STOP taking these medications   simethicone 125 MG chewable tablet Commonly known as: MYLICON Stopped by: Yao Hyppolite X Shone Leventhal, NP     TAKE these medications   accu-chek soft touch lancets Use as instructed   allopurinol 100 MG tablet Commonly known as: ZYLOPRIM TAKE 2 TABLETS BY MOUTH ONCE DAILY.   ALPRAZolam 0.25 MG tablet Commonly known as: XANAX Take one tablet by mouth twice daily as needed   bisoprolol-hydrochlorothiazide 10-6.25 MG tablet Commonly known as: ZIAC TAKE 1 TABLET ONCE DAILY.   clobetasol cream 0.05 % Commonly known as: TEMOVATE Apply 1 application topically as needed.   esomeprazole 40 MG capsule Commonly known as: NEXIUM TAKE 1 CAPSULE TWICE DAILY BEFORE MEALS   Fish Oil 1000 MG Caps Take 1 capsule by mouth daily.    FLUoxetine 40 MG capsule Commonly known as: PROZAC TAKE (1) CAPSULE DAILY.   glucose blood test strip Commonly known as: Accu-Chek SmartView Use to check blood sugar three times a day and PRN,  Dx code: E11.65   metFORMIN 500 MG tablet Commonly known as: GLUCOPHAGE Take 1 tablet (500 mg total) by mouth daily.   sucralfate 1 GM/10ML suspension Commonly known as: CARAFATE Take 10 mLs (1 g total) by mouth 4 (four) times daily.       Review of Systems:  Review of Systems  Constitutional: Negative for activity change, appetite change, chills, diaphoresis, fatigue, fever and unexpected weight change.  HENT: Positive for hearing loss. Negative for congestion and voice change.   Respiratory: Negative for cough, shortness of breath and wheezing.   Cardiovascular: Negative for chest pain, palpitations and leg swelling.  Gastrointestinal: Negative for abdominal distention, abdominal pain, constipation, diarrhea, nausea and vomiting.  Genitourinary: Negative for difficulty urinating, dysuria and urgency.       2x/night  Musculoskeletal: Negative for arthralgias and back pain.  Skin: Negative for color change and pallor.  Neurological: Negative for dizziness, speech difficulty, weakness and headaches.  Psychiatric/Behavioral: Negative for agitation, behavioral problems, hallucinations and sleep disturbance. The patient is nervous/anxious.     Health Maintenance  Topic Date Due  . FOOT EXAM  11/15/2017  . MAMMOGRAM  06/21/2018  . INFLUENZA VACCINE  10/20/2018  . OPHTHALMOLOGY EXAM  02/13/2019  . HEMOGLOBIN A1C  07/04/2019  . TETANUS/TDAP  03/02/2025  . DEXA SCAN  Completed  . PNA vac Low Risk Adult  Completed    Physical Exam: Vitals:   01/03/19 1401  BP: (!) 152/74  Pulse: 64  Temp: (!) 96 F (35.6 C)  SpO2: 97%  Weight: 195 lb 6.4 oz (88.6 kg)  Height: _0  (1.575 m)   Body mass index is 35.74 kg/m. Physical Exam Constitutional:      General: She is not in acute  distress.    Appearance: Normal appearance. She is not ill-appearing, toxic-appearing or diaphoretic.  HENT:     Head: Normocephalic and atraumatic.     Nose: Nose normal.     Mouth/Throat:     Mouth: Mucous membranes are moist.  Eyes:     Extraocular Movements: Extraocular movements intact.     Conjunctiva/sclera: Conjunctivae normal.  Pupils: Pupils are equal, round, and reactive to light.  Neck:     Musculoskeletal: Normal range of motion and neck supple.  Cardiovascular:     Rate and Rhythm: Normal rate and regular rhythm.     Heart sounds: No murmur.  Pulmonary:     Breath sounds: No wheezing, rhonchi or rales.  Abdominal:     General: Bowel sounds are normal. There is no distension.     Palpations: Abdomen is soft.     Tenderness: There is no abdominal tenderness. There is no left CVA tenderness, guarding or rebound.  Musculoskeletal:     Right lower leg: No edema.     Left lower leg: No edema.  Skin:    General: Skin is warm and dry.  Neurological:     General: No focal deficit present.     Mental Status: She is alert and oriented to person, place, and time. Mental status is at baseline.  Psychiatric:        Mood and Affect: Mood normal.        Behavior: Behavior normal.        Thought Content: Thought content normal.        Judgment: Judgment normal.     Labs reviewed: Basic Metabolic Panel: Recent Labs    05/29/18 0705 01/03/19 0705  NA 136 139  K 4.4 4.8  CL 100 102  CO2 26 29  GLUCOSE 106* 115*  BUN 27* 23  CREATININE 1.27* 1.16*  CALCIUM 9.1 9.4   Liver Function Tests: Recent Labs    05/29/18 0705 01/03/19 0705  AST 16 14  ALT 15 12  BILITOT 0.4 0.6  PROT 6.2 6.2   No results for input(s): LIPASE, AMYLASE in the last 8760 hours. No results for input(s): AMMONIA in the last 8760 hours. CBC: Recent Labs    01/03/19 0705  WBC 8.7  NEUTROABS 4,707  HGB 12.5  HCT 37.6  MCV 91.7  PLT 267   Lipid Panel: Recent Labs    05/29/18  0705 01/03/19 0705  CHOL 239* 235*  HDL 43* 44*  LDLCALC 153* 155*  TRIG 265* 198*  CHOLHDL 5.6* 5.3*   Lab Results  Component Value Date   HGBA1C 6.4 (H) 01/03/2019    Procedures since last visit: No results found.  Assessment/Plan  Essential hypertension Blood pressure is controlled, continue Ziac 10/5.14mg qd, update CMP/eGFR.   Chronic GERD Stable, continue Nexium 71m qd.   Diabetes mellitus type 2 in obese (HCC) Stable, last Hgb a1c 6.7 01/09/18, continue Metformin 5040mqd, update Hgb a1c, CBC/diff, TSH prior to the next appointment.   Anxiety Her mood is stable, continue Proac 4054md, Alprazolam 0.14m35md prn. Update TSH.   Chronic gout without tophus Chronic, hand, great toe, no flare up for a while, continue Allopurinol 100mg32m Update uric acid.   CKD (chronic kidney disease) stage 3, GFR 30-59 ml/min Last creat 1.5, will update CMP/eGFR   Labs/tests ordered:  CBC/diff, CMP/eGFR, uric acid, lipid panel, Hgb a1c, TSH  Next appt:  6 months

## 2019-01-03 NOTE — Assessment & Plan Note (Signed)
Stable, last Hgb a1c 6.7 01/09/18, continue Metformin 500mg  qd, update Hgb a1c, CBC/diff, TSH prior to the next appointment.

## 2019-01-03 NOTE — Assessment & Plan Note (Addendum)
Her mood is stable, continue Proac 40mg  qd, Alprazolam 0.25mg  bid prn. Update TSH.

## 2019-01-03 NOTE — Assessment & Plan Note (Signed)
Last creat 1.5, will update CMP/eGFR

## 2019-01-03 NOTE — Assessment & Plan Note (Signed)
Blood pressure is controlled, continue Ziac 10/5.25mg qd, update CMP/eGFR.

## 2019-01-04 LAB — COMPLETE METABOLIC PANEL WITH GFR
AG Ratio: 1.7 (calc) (ref 1.0–2.5)
ALT: 12 U/L (ref 6–29)
AST: 14 U/L (ref 10–35)
Albumin: 3.9 g/dL (ref 3.6–5.1)
Alkaline phosphatase (APISO): 87 U/L (ref 37–153)
BUN/Creatinine Ratio: 20 (calc) (ref 6–22)
BUN: 23 mg/dL (ref 7–25)
CO2: 29 mmol/L (ref 20–32)
Calcium: 9.4 mg/dL (ref 8.6–10.4)
Chloride: 102 mmol/L (ref 98–110)
Creat: 1.16 mg/dL — ABNORMAL HIGH (ref 0.60–0.88)
GFR, Est African American: 51 mL/min/{1.73_m2} — ABNORMAL LOW (ref >=60)
GFR, Est Non African American: 44 mL/min/{1.73_m2} — ABNORMAL LOW (ref >=60)
Globulin: 2.3 g/dL (calc) (ref 1.9–3.7)
Glucose, Bld: 115 mg/dL — ABNORMAL HIGH (ref 65–99)
Potassium: 4.8 mmol/L (ref 3.5–5.3)
Sodium: 139 mmol/L (ref 135–146)
Total Bilirubin: 0.6 mg/dL (ref 0.2–1.2)
Total Protein: 6.2 g/dL (ref 6.1–8.1)

## 2019-01-04 LAB — CBC WITH DIFFERENTIAL/PLATELET
Absolute Monocytes: 618 cells/uL (ref 200–950)
Basophils Absolute: 96 cells/uL (ref 0–200)
Basophils Relative: 1.1 %
Eosinophils Absolute: 400 cells/uL (ref 15–500)
Eosinophils Relative: 4.6 %
HCT: 37.6 % (ref 35.0–45.0)
Hemoglobin: 12.5 g/dL (ref 11.7–15.5)
Lymphs Abs: 2880 cells/uL (ref 850–3900)
MCH: 30.5 pg (ref 27.0–33.0)
MCHC: 33.2 g/dL (ref 32.0–36.0)
MCV: 91.7 fL (ref 80.0–100.0)
MPV: 11 fL (ref 7.5–12.5)
Monocytes Relative: 7.1 %
Neutro Abs: 4707 cells/uL (ref 1500–7800)
Neutrophils Relative %: 54.1 %
Platelets: 267 10*3/uL (ref 140–400)
RBC: 4.1 10*6/uL (ref 3.80–5.10)
RDW: 13.5 % (ref 11.0–15.0)
Total Lymphocyte: 33.1 %
WBC: 8.7 10*3/uL (ref 3.8–10.8)

## 2019-01-04 LAB — LIPID PANEL
Cholesterol: 235 mg/dL — ABNORMAL HIGH (ref <200)
HDL: 44 mg/dL — ABNORMAL LOW (ref >=50)
LDL Cholesterol (Calc): 155 mg/dL (calc) — ABNORMAL HIGH
Non-HDL Cholesterol (Calc): 191 mg/dL (calc) — ABNORMAL HIGH (ref <130)
Total CHOL/HDL Ratio: 5.3 (calc) — ABNORMAL HIGH (ref <5.0)
Triglycerides: 198 mg/dL — ABNORMAL HIGH (ref <150)

## 2019-01-04 LAB — HEMOGLOBIN A1C
Hgb A1c MFr Bld: 6.4 % of total Hgb — ABNORMAL HIGH (ref <5.7)
Mean Plasma Glucose: 137 (calc)
eAG (mmol/L): 7.6 (calc)

## 2019-01-04 LAB — URIC ACID: Uric Acid, Serum: 4.9 mg/dL (ref 2.5–7.0)

## 2019-02-01 ENCOUNTER — Other Ambulatory Visit: Payer: Self-pay | Admitting: Internal Medicine

## 2019-02-01 ENCOUNTER — Other Ambulatory Visit: Payer: Self-pay | Admitting: Nurse Practitioner

## 2019-02-20 ENCOUNTER — Other Ambulatory Visit: Payer: Self-pay | Admitting: Nurse Practitioner

## 2019-03-25 ENCOUNTER — Other Ambulatory Visit: Payer: Self-pay | Admitting: Nurse Practitioner

## 2019-04-01 ENCOUNTER — Other Ambulatory Visit: Payer: Self-pay | Admitting: Nurse Practitioner

## 2019-04-01 DIAGNOSIS — E1169 Type 2 diabetes mellitus with other specified complication: Secondary | ICD-10-CM

## 2019-04-17 DIAGNOSIS — H353132 Nonexudative age-related macular degeneration, bilateral, intermediate dry stage: Secondary | ICD-10-CM | POA: Diagnosis not present

## 2019-04-17 DIAGNOSIS — H524 Presbyopia: Secondary | ICD-10-CM | POA: Diagnosis not present

## 2019-04-17 DIAGNOSIS — E113292 Type 2 diabetes mellitus with mild nonproliferative diabetic retinopathy without macular edema, left eye: Secondary | ICD-10-CM | POA: Diagnosis not present

## 2019-04-17 DIAGNOSIS — H43813 Vitreous degeneration, bilateral: Secondary | ICD-10-CM | POA: Diagnosis not present

## 2019-05-06 ENCOUNTER — Other Ambulatory Visit: Payer: Self-pay | Admitting: Nurse Practitioner

## 2019-06-26 ENCOUNTER — Other Ambulatory Visit: Payer: Self-pay | Admitting: Nurse Practitioner

## 2019-06-26 NOTE — Telephone Encounter (Signed)
Patient has ePrescribed refill request for medications "Alprazolam" and "Bisoprolol". Patient has upcoming appointment tomorrow 06/27/2019. Medications pend and sent to provider Mast, Man X, NP in case medications need changes. Please Advise.

## 2019-06-27 ENCOUNTER — Other Ambulatory Visit: Payer: Self-pay

## 2019-06-27 DIAGNOSIS — E1169 Type 2 diabetes mellitus with other specified complication: Secondary | ICD-10-CM | POA: Diagnosis not present

## 2019-06-27 DIAGNOSIS — I10 Essential (primary) hypertension: Secondary | ICD-10-CM

## 2019-06-27 DIAGNOSIS — N183 Chronic kidney disease, stage 3 unspecified: Secondary | ICD-10-CM | POA: Diagnosis not present

## 2019-06-27 DIAGNOSIS — E669 Obesity, unspecified: Secondary | ICD-10-CM | POA: Diagnosis not present

## 2019-06-27 DIAGNOSIS — M1A00X0 Idiopathic chronic gout, unspecified site, without tophus (tophi): Secondary | ICD-10-CM | POA: Diagnosis not present

## 2019-06-28 LAB — CBC WITH DIFFERENTIAL/PLATELET
Absolute Monocytes: 561 cells/uL (ref 200–950)
Basophils Absolute: 71 cells/uL (ref 0–200)
Basophils Relative: 0.8 %
Eosinophils Absolute: 320 cells/uL (ref 15–500)
Eosinophils Relative: 3.6 %
HCT: 39.7 % (ref 35.0–45.0)
Hemoglobin: 13.1 g/dL (ref 11.7–15.5)
Lymphs Abs: 2697 cells/uL (ref 850–3900)
MCH: 30.7 pg (ref 27.0–33.0)
MCHC: 33 g/dL (ref 32.0–36.0)
MCV: 93 fL (ref 80.0–100.0)
MPV: 10.7 fL (ref 7.5–12.5)
Monocytes Relative: 6.3 %
Neutro Abs: 5251 cells/uL (ref 1500–7800)
Neutrophils Relative %: 59 %
Platelets: 283 10*3/uL (ref 140–400)
RBC: 4.27 10*6/uL (ref 3.80–5.10)
RDW: 14 % (ref 11.0–15.0)
Total Lymphocyte: 30.3 %
WBC: 8.9 10*3/uL (ref 3.8–10.8)

## 2019-06-28 LAB — LIPID PANEL
Cholesterol: 246 mg/dL — ABNORMAL HIGH (ref <200)
HDL: 48 mg/dL — ABNORMAL LOW (ref >=50)
LDL Cholesterol (Calc): 163 mg/dL (calc) — ABNORMAL HIGH
Non-HDL Cholesterol (Calc): 198 mg/dL (calc) — ABNORMAL HIGH (ref <130)
Total CHOL/HDL Ratio: 5.1 (calc) — ABNORMAL HIGH (ref <5.0)
Triglycerides: 191 mg/dL — ABNORMAL HIGH (ref <150)

## 2019-06-28 LAB — COMPLETE METABOLIC PANEL WITH GFR
AG Ratio: 1.8 (calc) (ref 1.0–2.5)
ALT: 13 U/L (ref 6–29)
AST: 15 U/L (ref 10–35)
Albumin: 4.1 g/dL (ref 3.6–5.1)
Alkaline phosphatase (APISO): 91 U/L (ref 37–153)
BUN/Creatinine Ratio: 21 (calc) (ref 6–22)
BUN: 25 mg/dL (ref 7–25)
CO2: 30 mmol/L (ref 20–32)
Calcium: 9.5 mg/dL (ref 8.6–10.4)
Chloride: 103 mmol/L (ref 98–110)
Creat: 1.18 mg/dL — ABNORMAL HIGH (ref 0.60–0.88)
GFR, Est African American: 50 mL/min/{1.73_m2} — ABNORMAL LOW (ref >=60)
GFR, Est Non African American: 44 mL/min/{1.73_m2} — ABNORMAL LOW (ref >=60)
Globulin: 2.3 g/dL (calc) (ref 1.9–3.7)
Glucose, Bld: 119 mg/dL — ABNORMAL HIGH (ref 65–99)
Potassium: 4.5 mmol/L (ref 3.5–5.3)
Sodium: 140 mmol/L (ref 135–146)
Total Bilirubin: 0.6 mg/dL (ref 0.2–1.2)
Total Protein: 6.4 g/dL (ref 6.1–8.1)

## 2019-06-28 LAB — HEMOGLOBIN A1C
Hgb A1c MFr Bld: 6.4 % of total Hgb — ABNORMAL HIGH (ref <5.7)
Mean Plasma Glucose: 137 (calc)
eAG (mmol/L): 7.6 (calc)

## 2019-06-28 LAB — URIC ACID: Uric Acid, Serum: 5 mg/dL (ref 2.5–7.0)

## 2019-06-28 LAB — TSH: TSH: 2.12 mIU/L (ref 0.40–4.50)

## 2019-07-03 ENCOUNTER — Encounter: Payer: Self-pay | Admitting: Internal Medicine

## 2019-07-05 ENCOUNTER — Non-Acute Institutional Stay: Payer: Medicare PPO | Admitting: Internal Medicine

## 2019-07-05 ENCOUNTER — Other Ambulatory Visit: Payer: Self-pay

## 2019-07-05 ENCOUNTER — Encounter: Payer: Self-pay | Admitting: Internal Medicine

## 2019-07-05 VITALS — BP 138/80 | HR 55 | Temp 97.1°F | Ht 62.0 in | Wt 190.6 lb

## 2019-07-05 DIAGNOSIS — M1A00X0 Idiopathic chronic gout, unspecified site, without tophus (tophi): Secondary | ICD-10-CM

## 2019-07-05 DIAGNOSIS — I10 Essential (primary) hypertension: Secondary | ICD-10-CM | POA: Diagnosis not present

## 2019-07-05 DIAGNOSIS — E1169 Type 2 diabetes mellitus with other specified complication: Secondary | ICD-10-CM

## 2019-07-05 DIAGNOSIS — K219 Gastro-esophageal reflux disease without esophagitis: Secondary | ICD-10-CM | POA: Diagnosis not present

## 2019-07-05 DIAGNOSIS — M542 Cervicalgia: Secondary | ICD-10-CM

## 2019-07-05 DIAGNOSIS — E669 Obesity, unspecified: Secondary | ICD-10-CM | POA: Diagnosis not present

## 2019-07-05 DIAGNOSIS — E785 Hyperlipidemia, unspecified: Secondary | ICD-10-CM

## 2019-07-05 DIAGNOSIS — N183 Chronic kidney disease, stage 3 unspecified: Secondary | ICD-10-CM | POA: Diagnosis not present

## 2019-07-05 NOTE — Progress Notes (Signed)
Location:  [place] of Service:  Clinic (12)  Provider:   Code Status:  Goals of Care:  Advanced Directives 03/05/2018  Does Patient Have a Medical Advance Directive? Yes  Type of Paramedic of [place];Living will;Out of facility DNR (pink MOST or yellow form)  Does patient want to make changes to medical advance directive? No - Patient declined  Copy of [place] in Chart? Yes - validated most recent copy scanned in chart (See row information)  Pre-existing out of facility DNR order (yellow form or pink MOST form) Yellow form placed in chart (order not valid for inpatient use);Pink MOST form placed in chart (order not valid for inpatient use)     Chief Complaint  Patient presents with  . Medical Management of Chronic Issues    Patient returns to the clinic for follow up. She continues to have pain on her left side. She fainted and feel after recieving 2nd dose covid vaccine. She would like you to take over refilling her clobetasol propionate, since she is no longer seeing the dermatologist.     HPI: Patient is a 79 y.o. female seen today for medical management of chronic diseases.    Has h/o Hypertension, Hyperlipidemia, Gout, Type 2 Diabetes, Depression with Anxiety, GERD, Arthritis.  Came in for Routine Visit Neck Pain Has been having this for past few Months Radiating down to her Shoulder and Arm. Tyelnol does help with Pain S/p Fall Had Fall after getting 2nd Covid shot Says she felt nauseated and felt like she was going to fall and next thing she knows she was on the Floor. Was not seen by the Nurse. Had Lump on her head Did not go to ED. No Epidoses since then GERD S/P EGD Takes Protonix and Carafate {PRN Gout  Symptoms Controlled Diabetes CBG at home 130-170 Anxiety Mood stable Takes Xanax Prn  [place]ives in [place] herself. Has one dayughter in [place] who is POA Son in [place].  Independent..   Past Medical History:  Diagnosis Date  . Allergy   . Anemia   . Anxiety   . Arthritis    with gout  . Carotid bruit    right  . Cataract    bil cataracts removed  . Chest pain   . Chronic kidney disease   . Colon polyps   . Depression   . Diabetes type 2, uncontrolled ([place])   . Diverticulosis   . Duodenitis   . Exogenous obesity   . Gastric ulcer   . GERD (gastroesophageal reflux disease)   . Hyperlipidemia   . Hypertension   . Metabolic syndrome   . Osteopenia   . Seasonal allergies    seasonal  . Tubular adenoma of colon     Past Surgical History:  Procedure Laterality Date  . COLONOSCOPY     11/90,11/91,1994,2000,2010  . DILATION AND CURETTAGE OF UTERUS    . DILATION AND CURETTAGE OF UTERUS  1997  . FOOT SURGERY     x 2  . HAND SURGERY Right    Carpel Tunnel  . POLYPECTOMY    . TUBAL LIGATION    . uterine polyps removed     x2    Allergies  Allergen Reactions  . Crestor [Rosuvastatin Calcium]     Myalgias   . Gemfibrozil Itching  . Iodine     Skin rash  . Lipitor [Atorvastatin Calcium]     Myalgias   .  Lovastatin     myalgias  . Sulfonamide Derivatives     REACTION: n\T\v  . Sulfur     Made infection worse  . Zetia [Ezetimibe]     Cough, dry    Outpatient Encounter Medications as of 07/05/2019  Medication Sig  . Accu-Chek FastClix Lancets MISC USE AS DIRECTED  . ACCU-CHEK SMARTVIEW test strip CHECK BLOOD SUGAR 3 TIMES DAILY AS DIRECTED.  Marland Kitchen allopurinol (ZYLOPRIM) 100 MG tablet TAKE 2 TABLETS BY MOUTH ONCE DAILY.  Marland Kitchen ALPRAZolam (XANAX) 0.25 MG tablet TAKE (1) TABLET TWICE A DAY AS NEEDED.  . bisoprolol-hydrochlorothiazide (ZIAC) 10-6.25 MG tablet TAKE 1 TABLET ONCE DAILY.  Marland Kitchen Cholecalciferol (VITAMIN D3) 50 MCG (2000 UT) capsule Take 2,000 Units by mouth daily.  . clobetasol cream (TEMOVATE) [id number] % Apply 1 application topically as needed.   . clobetasol cream (TEMOVATE) [id number] % Apply 1 application topically as needed.  .  docusate sodium (COLACE) 100 MG capsule Take 100 mg by mouth daily.  Marland Kitchen esomeprazole (NEXIUM) 40 MG capsule TAKE 1 CAPSULE TWICE DAILY BEFORE MEALS  . FLUoxetine (PROZAC) 40 MG capsule TAKE (1) CAPSULE DAILY.  . metFORMIN (GLUCOPHAGE) 500 MG tablet TAKE 1 TABLET ONCE DAILY.  . Multiple Vitamins-Minerals (PRESERVISION AREDS 2 PO) Take 2 tablets by mouth daily.  . Omega-3 Fatty Acids (FISH OIL) 1000 MG CAPS Take 1 capsule by mouth daily.   . sucralfate (CARAFATE) 1 GM/10ML suspension Take 10 mLs (1 g total) by mouth 4 (four) times daily.   No facility-administered encounter medications on file as of 07/05/2019.    Review of Systems:  Review of Systems  Constitutional: Negative.   HENT: Negative.   Respiratory: Negative.   Cardiovascular: Negative.   Gastrointestinal: Negative.   Genitourinary: Negative.   Musculoskeletal: Positive for myalgias, neck pain and neck stiffness.  Skin: Negative.   Neurological: Negative.   Psychiatric/Behavioral: The patient is nervous/anxious.     Health Maintenance  Topic Date Due  . FOOT EXAM  11/15/2017  . MAMMOGRAM  06/21/2018  . OPHTHALMOLOGY EXAM  02/13/2019  . INFLUENZA VACCINE  10/20/2019  . HEMOGLOBIN A1C  12/27/2019  . TETANUS/TDAP  03/02/2025  . DEXA SCAN  Completed  . PNA vac Low Risk Adult  Completed    Physical Exam: Vitals:   07/05/19 1024  BP: 138/80  Pulse: (!) 55  Temp: (!) 97.1 F (36.2 C)  SpO2: 97%  Weight: 190 lb 9.6 oz (86.5 kg)  Height: 5\' 2"  (1.575 m)   Body mass index is 34.86 kg/m. Physical Exam  Constitutional: Oriented to person, place, and time. Well-developed and well-nourished.  HENT:  Head: Normocephalic.  Mouth/Throat: Oropharynx is clear and moist. Has Teeth stained Ears No Wax TM good  Eyes: Pupils are equal, round, and reactive to light.  Neck: Neck supple.  Cardiovascular: Normal rate and normal heart sounds.  No murmur heard. Pulmonary/Chest: Effort normal and breath sounds normal. No  respiratory distress. No wheezes. She has no rales.  Abdominal: Soft. Bowel sounds are normal. No distension. There is no tenderness. There is no rebound.  Musculoskeletal: No edema.  No Point Tenderness in Cervical Spine Colains of pain when moves her head Laterally Left Shoulder has Mild Restriction of Movement Arthritis Present Lymphadenopathy: none Neurological: Alert and oriented to person, place, and time.  Gait stable Skin: Skin is warm and dry.  Psychiatric: Normal mood and affect. Behavior is normal. Thought content normal.    Labs reviewed: Basic Metabolic Panel: Recent Labs  01/03/19 0705 06/27/19 0730  NA 139 140  K 4.8 4.5  CL 102 103  CO2 29 30  GLUCOSE 115* 119*  BUN 23 25  CREATININE 1.16* 1.18*  CALCIUM 9.4 9.5  TSH  --  2.12   Liver Function Tests: Recent Labs    01/03/19 0705 06/27/19 0730  AST 14 15  ALT 12 13  BILITOT 0.6 0.6  PROT 6.2 6.4   No results for input(s): LIPASE, AMYLASE in the last 8760 hours. No results for input(s): AMMONIA in the last 8760 hours. CBC: Recent Labs    01/03/19 0705 06/27/19 0730  WBC 8.7 8.9  NEUTROABS 4,707 5,251  HGB 12.5 13.1  HCT 37.6 39.7  MCV 91.7 93.0  PLT 267 283   Lipid Panel: Recent Labs    01/03/19 0705 06/27/19 0730  CHOL 235* 246*  HDL 44* 48*  LDLCALC 155* 163*  TRIG 198* 191*  CHOLHDL 5.3* 5.1*   Lab Results  Component Value Date   HGBA1C 6.4 (H) 06/27/2019    Procedures since last visit: No results found.  Assessment/Plan Neck pain Order MRI to Rule out Cervical Stenosis and DJD  Essential hypertension On Ziac  Episode of Vaso vagal Happened after her 2nd Shot of Covid Advised patient to go to ED if any more Episodes of Dizziness/Fall  Chronic GERD S/P EGD Continue Esomeprazole and Carafate PRN    Diabetes mellitus type 2 in obese (HCC) A1C less then 7 and stable on Metformin  Idiopathic chronic gout without tophus, unspecified site Uric acid in good  levels  Stage 3 chronic kidney disease, unspecified whether stage 3a or 3b CKD Creat stable Reviewed with Patient Dyslipidemia Allergic to statins Has tried Everything Discuss the risk. Exercise and Diet Controlled  Depression And Anxiety Controlled on Prozac and PRN XAnax   Is going to schedeule herself for Mammogram Wants to [place] next visit Has received all vaccinations also Shingles per Patient   Labs/tests ordered:  * No order type specified * Next appt:  Visit date not found    Total time spent in this patient care encounter was 45 _  minutes; greater than 50% of the visit spent counseling patient and staff, reviewing records , Labs and coordinating care for problems addressed at this encounter.

## 2019-07-31 ENCOUNTER — Other Ambulatory Visit: Payer: Self-pay | Admitting: Nurse Practitioner

## 2019-07-31 ENCOUNTER — Other Ambulatory Visit: Payer: Self-pay | Admitting: Internal Medicine

## 2019-08-05 ENCOUNTER — Other Ambulatory Visit: Payer: Self-pay | Admitting: Internal Medicine

## 2019-08-06 ENCOUNTER — Ambulatory Visit
Admission: RE | Admit: 2019-08-06 | Discharge: 2019-08-06 | Disposition: A | Discharge status: Home / Self Care | Payer: Medicare PPO | Source: Ambulatory Visit | Attending: Internal Medicine | Admitting: Internal Medicine

## 2019-08-06 DIAGNOSIS — M4802 Spinal stenosis, cervical region: Secondary | ICD-10-CM | POA: Diagnosis not present

## 2019-08-06 DIAGNOSIS — M542 Cervicalgia: Secondary | ICD-10-CM

## 2019-08-07 ENCOUNTER — Encounter: Payer: Self-pay | Admitting: Nurse Practitioner

## 2019-08-07 DIAGNOSIS — M5030 Other cervical disc degeneration, unspecified cervical region: Secondary | ICD-10-CM | POA: Insufficient documentation

## 2019-08-13 ENCOUNTER — Other Ambulatory Visit: Payer: Self-pay | Admitting: Internal Medicine

## 2019-08-13 DIAGNOSIS — M542 Cervicalgia: Secondary | ICD-10-CM

## 2019-08-22 DIAGNOSIS — M5412 Radiculopathy, cervical region: Secondary | ICD-10-CM | POA: Diagnosis not present

## 2019-08-22 DIAGNOSIS — M542 Cervicalgia: Secondary | ICD-10-CM | POA: Diagnosis not present

## 2019-08-22 DIAGNOSIS — M5440 Lumbago with sciatica, unspecified side: Secondary | ICD-10-CM | POA: Diagnosis not present

## 2019-09-03 DIAGNOSIS — M48061 Spinal stenosis, lumbar region without neurogenic claudication: Secondary | ICD-10-CM | POA: Diagnosis not present

## 2019-09-03 DIAGNOSIS — M5117 Intervertebral disc disorders with radiculopathy, lumbosacral region: Secondary | ICD-10-CM | POA: Diagnosis not present

## 2019-09-03 DIAGNOSIS — M4726 Other spondylosis with radiculopathy, lumbar region: Secondary | ICD-10-CM | POA: Diagnosis not present

## 2019-09-03 DIAGNOSIS — M5440 Lumbago with sciatica, unspecified side: Secondary | ICD-10-CM | POA: Diagnosis not present

## 2019-09-10 DIAGNOSIS — M48061 Spinal stenosis, lumbar region without neurogenic claudication: Secondary | ICD-10-CM | POA: Diagnosis not present

## 2019-09-10 DIAGNOSIS — M5440 Lumbago with sciatica, unspecified side: Secondary | ICD-10-CM | POA: Diagnosis not present

## 2019-09-16 DIAGNOSIS — R030 Elevated blood-pressure reading, without diagnosis of hypertension: Secondary | ICD-10-CM | POA: Diagnosis not present

## 2019-09-16 DIAGNOSIS — Z6837 Body mass index (BMI) 37.0-37.9, adult: Secondary | ICD-10-CM | POA: Diagnosis not present

## 2019-09-16 DIAGNOSIS — M48061 Spinal stenosis, lumbar region without neurogenic claudication: Secondary | ICD-10-CM | POA: Diagnosis not present

## 2019-10-10 ENCOUNTER — Other Ambulatory Visit: Payer: Self-pay | Admitting: Nurse Practitioner

## 2019-10-16 DIAGNOSIS — I10 Essential (primary) hypertension: Secondary | ICD-10-CM | POA: Diagnosis not present

## 2019-10-16 DIAGNOSIS — Z6837 Body mass index (BMI) 37.0-37.9, adult: Secondary | ICD-10-CM | POA: Diagnosis not present

## 2019-10-16 DIAGNOSIS — M48061 Spinal stenosis, lumbar region without neurogenic claudication: Secondary | ICD-10-CM | POA: Diagnosis not present

## 2019-10-21 DIAGNOSIS — R634 Abnormal weight loss: Secondary | ICD-10-CM | POA: Diagnosis not present

## 2019-10-21 DIAGNOSIS — M542 Cervicalgia: Secondary | ICD-10-CM | POA: Diagnosis not present

## 2019-10-21 DIAGNOSIS — R58 Hemorrhage, not elsewhere classified: Secondary | ICD-10-CM | POA: Diagnosis not present

## 2019-10-21 DIAGNOSIS — Z86010 Personal history of colonic polyps: Secondary | ICD-10-CM | POA: Diagnosis not present

## 2019-10-21 DIAGNOSIS — M4722 Other spondylosis with radiculopathy, cervical region: Secondary | ICD-10-CM | POA: Diagnosis not present

## 2019-10-21 DIAGNOSIS — R011 Cardiac murmur, unspecified: Secondary | ICD-10-CM | POA: Diagnosis not present

## 2019-10-21 DIAGNOSIS — R29898 Other symptoms and signs involving the musculoskeletal system: Secondary | ICD-10-CM | POA: Diagnosis not present

## 2019-10-23 DIAGNOSIS — R011 Cardiac murmur, unspecified: Secondary | ICD-10-CM | POA: Diagnosis not present

## 2019-10-23 DIAGNOSIS — Z86010 Personal history of colonic polyps: Secondary | ICD-10-CM | POA: Diagnosis not present

## 2019-10-23 DIAGNOSIS — M542 Cervicalgia: Secondary | ICD-10-CM | POA: Diagnosis not present

## 2019-10-23 DIAGNOSIS — R58 Hemorrhage, not elsewhere classified: Secondary | ICD-10-CM | POA: Diagnosis not present

## 2019-10-23 DIAGNOSIS — R29898 Other symptoms and signs involving the musculoskeletal system: Secondary | ICD-10-CM | POA: Diagnosis not present

## 2019-10-23 DIAGNOSIS — R634 Abnormal weight loss: Secondary | ICD-10-CM | POA: Diagnosis not present

## 2019-10-23 DIAGNOSIS — M4722 Other spondylosis with radiculopathy, cervical region: Secondary | ICD-10-CM | POA: Diagnosis not present

## 2019-10-25 DIAGNOSIS — Z86010 Personal history of colonic polyps: Secondary | ICD-10-CM | POA: Diagnosis not present

## 2019-10-25 DIAGNOSIS — M4722 Other spondylosis with radiculopathy, cervical region: Secondary | ICD-10-CM | POA: Diagnosis not present

## 2019-10-25 DIAGNOSIS — R58 Hemorrhage, not elsewhere classified: Secondary | ICD-10-CM | POA: Diagnosis not present

## 2019-10-25 DIAGNOSIS — M542 Cervicalgia: Secondary | ICD-10-CM | POA: Diagnosis not present

## 2019-10-25 DIAGNOSIS — R29898 Other symptoms and signs involving the musculoskeletal system: Secondary | ICD-10-CM | POA: Diagnosis not present

## 2019-10-25 DIAGNOSIS — R011 Cardiac murmur, unspecified: Secondary | ICD-10-CM | POA: Diagnosis not present

## 2019-10-25 DIAGNOSIS — R634 Abnormal weight loss: Secondary | ICD-10-CM | POA: Diagnosis not present

## 2019-10-28 DIAGNOSIS — Z86010 Personal history of colonic polyps: Secondary | ICD-10-CM | POA: Diagnosis not present

## 2019-10-28 DIAGNOSIS — R58 Hemorrhage, not elsewhere classified: Secondary | ICD-10-CM | POA: Diagnosis not present

## 2019-10-28 DIAGNOSIS — M4722 Other spondylosis with radiculopathy, cervical region: Secondary | ICD-10-CM | POA: Diagnosis not present

## 2019-10-28 DIAGNOSIS — R011 Cardiac murmur, unspecified: Secondary | ICD-10-CM | POA: Diagnosis not present

## 2019-10-28 DIAGNOSIS — M542 Cervicalgia: Secondary | ICD-10-CM | POA: Diagnosis not present

## 2019-10-28 DIAGNOSIS — R29898 Other symptoms and signs involving the musculoskeletal system: Secondary | ICD-10-CM | POA: Diagnosis not present

## 2019-10-28 DIAGNOSIS — R634 Abnormal weight loss: Secondary | ICD-10-CM | POA: Diagnosis not present

## 2019-10-30 DIAGNOSIS — N183 Chronic kidney disease, stage 3 unspecified: Secondary | ICD-10-CM | POA: Diagnosis not present

## 2019-10-30 DIAGNOSIS — M4722 Other spondylosis with radiculopathy, cervical region: Secondary | ICD-10-CM | POA: Diagnosis not present

## 2019-10-30 DIAGNOSIS — E1169 Type 2 diabetes mellitus with other specified complication: Secondary | ICD-10-CM | POA: Diagnosis not present

## 2019-10-30 DIAGNOSIS — R011 Cardiac murmur, unspecified: Secondary | ICD-10-CM | POA: Diagnosis not present

## 2019-10-30 DIAGNOSIS — R634 Abnormal weight loss: Secondary | ICD-10-CM | POA: Diagnosis not present

## 2019-10-30 DIAGNOSIS — R58 Hemorrhage, not elsewhere classified: Secondary | ICD-10-CM | POA: Diagnosis not present

## 2019-10-30 DIAGNOSIS — E669 Obesity, unspecified: Secondary | ICD-10-CM | POA: Diagnosis not present

## 2019-10-30 DIAGNOSIS — Z86010 Personal history of colonic polyps: Secondary | ICD-10-CM | POA: Diagnosis not present

## 2019-10-30 DIAGNOSIS — R29898 Other symptoms and signs involving the musculoskeletal system: Secondary | ICD-10-CM | POA: Diagnosis not present

## 2019-10-30 DIAGNOSIS — M542 Cervicalgia: Secondary | ICD-10-CM | POA: Diagnosis not present

## 2019-10-31 ENCOUNTER — Other Ambulatory Visit: Payer: Self-pay

## 2019-10-31 DIAGNOSIS — E669 Obesity, unspecified: Secondary | ICD-10-CM

## 2019-10-31 DIAGNOSIS — N183 Chronic kidney disease, stage 3 unspecified: Secondary | ICD-10-CM

## 2019-11-01 DIAGNOSIS — R011 Cardiac murmur, unspecified: Secondary | ICD-10-CM | POA: Diagnosis not present

## 2019-11-01 DIAGNOSIS — R29898 Other symptoms and signs involving the musculoskeletal system: Secondary | ICD-10-CM | POA: Diagnosis not present

## 2019-11-01 DIAGNOSIS — Z86010 Personal history of colonic polyps: Secondary | ICD-10-CM | POA: Diagnosis not present

## 2019-11-01 DIAGNOSIS — M4722 Other spondylosis with radiculopathy, cervical region: Secondary | ICD-10-CM | POA: Diagnosis not present

## 2019-11-01 DIAGNOSIS — R58 Hemorrhage, not elsewhere classified: Secondary | ICD-10-CM | POA: Diagnosis not present

## 2019-11-01 DIAGNOSIS — M542 Cervicalgia: Secondary | ICD-10-CM | POA: Diagnosis not present

## 2019-11-01 DIAGNOSIS — R634 Abnormal weight loss: Secondary | ICD-10-CM | POA: Diagnosis not present

## 2019-11-01 LAB — COMPLETE METABOLIC PANEL WITH GFR
AG Ratio: 1.8 (calc) (ref 1.0–2.5)
ALT: 13 U/L (ref 6–29)
AST: 13 U/L (ref 10–35)
Albumin: 3.9 g/dL (ref 3.6–5.1)
Alkaline phosphatase (APISO): 92 U/L (ref 37–153)
BUN/Creatinine Ratio: 24 (calc) — ABNORMAL HIGH (ref 6–22)
BUN: 27 mg/dL — ABNORMAL HIGH (ref 7–25)
CO2: 27 mmol/L (ref 20–32)
Calcium: 9.2 mg/dL (ref 8.6–10.4)
Chloride: 102 mmol/L (ref 98–110)
Creat: 1.13 mg/dL — ABNORMAL HIGH (ref 0.60–0.88)
GFR, Est African American: 53 mL/min/{1.73_m2} — ABNORMAL LOW (ref >=60)
GFR, Est Non African American: 46 mL/min/{1.73_m2} — ABNORMAL LOW (ref >=60)
Globulin: 2.2 g/dL (calc) (ref 1.9–3.7)
Glucose, Bld: 108 mg/dL — ABNORMAL HIGH (ref 65–99)
Potassium: 4.3 mmol/L (ref 3.5–5.3)
Sodium: 137 mmol/L (ref 135–146)
Total Bilirubin: 0.7 mg/dL (ref 0.2–1.2)
Total Protein: 6.1 g/dL (ref 6.1–8.1)

## 2019-11-01 LAB — HEMOGLOBIN A1C
Hgb A1c MFr Bld: 6.6 % of total Hgb — ABNORMAL HIGH (ref <5.7)
Mean Plasma Glucose: 143 (calc)
eAG (mmol/L): 7.9 (calc)

## 2019-11-04 ENCOUNTER — Other Ambulatory Visit: Payer: Self-pay | Admitting: Nurse Practitioner

## 2019-11-04 ENCOUNTER — Other Ambulatory Visit: Payer: Self-pay | Admitting: Internal Medicine

## 2019-11-07 ENCOUNTER — Non-Acute Institutional Stay: Payer: Medicare PPO | Admitting: Nurse Practitioner

## 2019-11-07 ENCOUNTER — Encounter: Payer: Self-pay | Admitting: Nurse Practitioner

## 2019-11-07 ENCOUNTER — Other Ambulatory Visit: Payer: Self-pay

## 2019-11-07 ENCOUNTER — Other Ambulatory Visit: Payer: Self-pay | Admitting: Internal Medicine

## 2019-11-07 DIAGNOSIS — I10 Essential (primary) hypertension: Secondary | ICD-10-CM | POA: Diagnosis not present

## 2019-11-07 DIAGNOSIS — E785 Hyperlipidemia, unspecified: Secondary | ICD-10-CM

## 2019-11-07 DIAGNOSIS — E1169 Type 2 diabetes mellitus with other specified complication: Secondary | ICD-10-CM | POA: Diagnosis not present

## 2019-11-07 DIAGNOSIS — H3530 Unspecified macular degeneration: Secondary | ICD-10-CM | POA: Insufficient documentation

## 2019-11-07 DIAGNOSIS — E669 Obesity, unspecified: Secondary | ICD-10-CM

## 2019-11-07 DIAGNOSIS — M1A00X0 Idiopathic chronic gout, unspecified site, without tophus (tophi): Secondary | ICD-10-CM | POA: Diagnosis not present

## 2019-11-07 DIAGNOSIS — K219 Gastro-esophageal reflux disease without esophagitis: Secondary | ICD-10-CM | POA: Diagnosis not present

## 2019-11-07 DIAGNOSIS — M5030 Other cervical disc degeneration, unspecified cervical region: Secondary | ICD-10-CM | POA: Diagnosis not present

## 2019-11-07 DIAGNOSIS — F419 Anxiety disorder, unspecified: Secondary | ICD-10-CM

## 2019-11-07 DIAGNOSIS — Z1231 Encounter for screening mammogram for malignant neoplasm of breast: Secondary | ICD-10-CM

## 2019-11-07 NOTE — Progress Notes (Signed)
Location:   Clinic [facility]   Place of Service:  Clinic (12) Provider: Marlana Latus NP  Code Status: DNR Goals of Care: [place] Advanced Directives 03/05/2018  Does Patient Have a Medical Advance Directive? Yes  Type of Paramedic of [place];Living will;Out of facility DNR (pink MOST or yellow form)  Does patient want to make changes to medical advance directive? No - Patient declined  Copy of [place] in Chart? Yes - validated most recent copy scanned in chart (See row information)  Pre-existing out of facility DNR order (yellow form or pink MOST form) Yellow form placed in chart (order not valid for inpatient use);Pink MOST form placed in chart (order not valid for inpatient use)     Chief Complaint  Patient presents with  . Medical Management of Chronic Issues    Patient returns to the clinic for follow up.   Marland Kitchen Health Maintenance    Mammogram, foot and eye exam, flu shot starts Sep. 21st    HPI: Patient is a 79 y.o. female seen today for medical management of chronic diseases.    T2DM, Hgb a1c 6.6 10/30/19, takes Metformin 500mg  qd.   No acute gout flare ups, uric acid 4.9 01/01/19, on Allopurinol 100mg  qd.  Her mood is stable, on Alprazolam 0.25mg  bid prn, Prozac 40mg  qd.   GERD, stable on Nexium 40mg  qd, Carafate qid  HTN, blood pressure is controlled, on Ziac 10/6.25mg  qd.   LDL 163 06/27/19     Past Medical History:  Diagnosis Date  . Allergy   . Anemia   . Anxiety   . Arthritis    with gout  . Carotid bruit    right  . Cataract    bil cataracts removed  . Chest pain   . Chronic kidney disease   . Colon polyps   . Depression   . Diabetes type 2, uncontrolled ([place])   . Diverticulosis   . Duodenitis   . Exogenous obesity   . Gastric ulcer   . GERD (gastroesophageal reflux disease)   . Hyperlipidemia   . Hypertension   . Metabolic syndrome   . Osteopenia   . Seasonal allergies    seasonal  . Tubular adenoma of colon       Past Surgical History:  Procedure Laterality Date  . COLONOSCOPY     11/90,11/91,1994,2000,2010  . DILATION AND CURETTAGE OF UTERUS    . DILATION AND CURETTAGE OF UTERUS  1997  . FOOT SURGERY     x 2  . HAND SURGERY Right    Carpel Tunnel  . POLYPECTOMY    . TUBAL LIGATION    . uterine polyps removed     x2    Allergies  Allergen Reactions  . Crestor [Rosuvastatin Calcium]     Myalgias   . Gemfibrozil Itching  . Iodine     Skin rash  . Lipitor [Atorvastatin Calcium]     Myalgias   . Lovastatin     myalgias  . Sulfonamide Derivatives     REACTION: n\T\v  . Sulfur     Made infection worse  . Zetia [Ezetimibe]     Cough, dry    Allergies as of 11/07/2019      Reactions   Crestor [rosuvastatin Calcium]    Myalgias   Gemfibrozil Itching   Iodine    Skin rash   Lipitor [atorvastatin Calcium]    Myalgias   Lovastatin    myalgias   Sulfonamide  Derivatives    REACTION: n\T\v   Sulfur    Made infection worse   Zetia [ezetimibe]    Cough, dry      Medication List       Accurate as of November 07, 2019 11:59 PM. If you have any questions, ask your nurse or doctor.        STOP taking these medications   PRESERVISION AREDS 2 PO Stopped by: Akasia Ahmad X Krystyne Tewksbury, NP     TAKE these medications   Accu-Chek FastClix Lancets Misc USE AS DIRECTED   Accu-Chek SmartView test strip Generic drug: glucose blood CHECK BLOOD SUGAR 3 TIMES DAILY AS DIRECTED.   acetaminophen 500 MG tablet Commonly known as: TYLENOL Take 500 mg by mouth every 6 (six) hours as needed.   allopurinol 100 MG tablet Commonly known as: ZYLOPRIM TAKE 2 TABLETS BY MOUTH ONCE DAILY.   ALPRAZolam 0.25 MG tablet Commonly known as: XANAX TAKE (1) TABLET TWICE A DAY AS NEEDED.   bisoprolol-hydrochlorothiazide 10-6.25 MG tablet Commonly known as: ZIAC TAKE 1 TABLET ONCE DAILY.   clobetasol cream 0.05 % Commonly known as: TEMOVATE Apply 1 application topically as needed.   clobetasol cream  0.05 % Commonly known as: TEMOVATE Apply 1 application topically as needed.   docusate sodium 100 MG capsule Commonly known as: COLACE Take 100 mg by mouth daily.   esomeprazole 40 MG capsule Commonly known as: NEXIUM TAKE 1 CAPSULE TWICE DAILY BEFORE MEALS   Fish Oil 1000 MG Caps Take 1 capsule by mouth daily.   FLUoxetine 40 MG capsule Commonly known as: PROZAC TAKE (1) CAPSULE DAILY.   metFORMIN 500 MG tablet Commonly known as: GLUCOPHAGE TAKE 1 TABLET ONCE DAILY.   sucralfate 1 GM/10ML suspension Commonly known as: CARAFATE Take 10 mLs (1 g total) by mouth 4 (four) times daily.   Vitamin D3 50 MCG (2000 UT) capsule Take 2,000 Units by mouth daily.       Review of Systems:  Review of Systems  Constitutional: Negative for activity change, appetite change and fever.  HENT: Positive for hearing loss. Negative for congestion and voice change.   Respiratory: Negative for cough and shortness of breath.   Cardiovascular: Negative for leg swelling.  Gastrointestinal: Negative for abdominal pain, constipation, nausea and vomiting.  Genitourinary: Negative for difficulty urinating, dysuria and urgency.       1-2x/night  Musculoskeletal: Positive for arthralgias, back pain and gait problem. Negative for joint swelling.  Skin: Negative for color change.  Neurological: Negative for speech difficulty, weakness and headaches.  Psychiatric/Behavioral: Negative for agitation, behavioral problems, hallucinations and sleep disturbance. The patient is nervous/anxious.     Health Maintenance  Topic Date Due  . FOOT EXAM  11/15/2017  . MAMMOGRAM  06/21/2018  . INFLUENZA VACCINE  10/20/2019  . HEMOGLOBIN A1C  05/01/2020  . TETANUS/TDAP  03/02/2025  . DEXA SCAN  Completed  . COVID-19 Vaccine  Completed  . PNA vac Low Risk Adult  Completed  . OPHTHALMOLOGY EXAM  Discontinued    Physical Exam: Vitals:   11/07/19 1319  BP: 132/80  Pulse: (!) 50  Temp: 97.7 F (36.5 C)    SpO2: 98%   There is no height or weight on file to calculate BMI. Physical Exam Constitutional:      Appearance: Normal appearance.  HENT:     Head: Normocephalic and atraumatic.     Nose: Nose normal.     Mouth/Throat:     Mouth: Mucous membranes are moist.  Eyes:     Extraocular Movements: Extraocular movements intact.     Conjunctiva/sclera: Conjunctivae normal.     Pupils: Pupils are equal, round, and reactive to light.  Cardiovascular:     Rate and Rhythm: Normal rate and regular rhythm.     Heart sounds: No murmur heard.   Pulmonary:     Breath sounds: No wheezing, rhonchi or rales.  Abdominal:     General: Bowel sounds are normal.     Palpations: Abdomen is soft.     Tenderness: There is no abdominal tenderness. There is no guarding.  Musculoskeletal:     Cervical back: Normal range of motion and neck supple.     Right lower leg: No edema.     Left lower leg: No edema.     Comments: Neck pain, lower back pain radiating to the left buttock/thigh/knee.   Skin:    General: Skin is warm and dry.  Neurological:     General: No focal deficit present.     Mental Status: She is alert and oriented to person, place, and time. Mental status is at baseline.  Psychiatric:        Mood and Affect: Mood normal.        Behavior: Behavior normal.        Thought Content: Thought content normal.        Judgment: Judgment normal.     Labs reviewed: Basic Metabolic Panel: Recent Labs    01/03/19 0705 06/27/19 0730 10/30/19 0918  NA 139 140 137  K 4.8 4.5 4.3  CL 102 103 102  CO2 29 30 27   GLUCOSE 115* 119* 108*  BUN 23 25 27*  CREATININE 1.16* 1.18* 1.13*  CALCIUM 9.4 9.5 9.2  TSH  --  2.12  --    Liver Function Tests: Recent Labs    01/03/19 0705 06/27/19 0730 10/30/19 0918  AST 14 15 13   ALT 12 13 13   BILITOT 0.6 0.6 0.7  PROT 6.2 6.4 6.1   No results for input(s): LIPASE, AMYLASE in the last 8760 hours. No results for input(s): AMMONIA in the last 8760  hours. CBC: Recent Labs    01/03/19 0705 06/27/19 0730  WBC 8.7 8.9  NEUTROABS 4,707 5,251  HGB 12.5 13.1  HCT 37.6 39.7  MCV 91.7 93.0  PLT 267 283   Lipid Panel: Recent Labs    01/03/19 0705 06/27/19 0730  CHOL 235* 246*  HDL 44* 48*  LDLCALC 155* 163*  TRIG 198* 191*  CHOLHDL 5.3* 5.1*   Lab Results  Component Value Date   HGBA1C 6.6 (H) 10/30/2019    Procedures since last visit: No results found.  Assessment/Plan  Diabetes mellitus type 2 in obese (HCC) T2DM, Hgb a1c 6.6 10/30/19, takes Metformin 500mg  qd. Due for eye/foot exam-yearly   Gout No acute gout flare ups, uric acid 4.9 01/01/19, on Allopurinol 100mg  qd.   Anxiety Her mood is stable, on Alprazolam 0.25mg  bid prn, Prozac 40mg  qd.    Chronic GERD  stable on Nexium 40mg  qd, Carafate qid   Essential hypertension HTN, blood pressure is controlled, on Ziac 10/6.25mg  qd. HR 58 bpm apical.   Dyslipidemia LDL 163 06/27/19, diet control.  Macular degeneration of left eye Left eye, presivision, f/u yearly   DDD (degenerative disc disease), cervical Had neck and lower back scan done, f/u Neurosurgeon, L2-L3 inj, left radiculopathy, down to her left knee   Labs/tests ordered:  The patient will schedule a mammogram  Next appt:  3-4 months with Dr. Lyndel Safe.

## 2019-11-07 NOTE — Assessment & Plan Note (Addendum)
T2DM, Hgb a1c 6.6 10/30/19, takes Metformin 500mg  qd. Due for eye/foot exam-yearly

## 2019-11-07 NOTE — Assessment & Plan Note (Signed)
No acute gout flare ups, uric acid 4.9 01/01/19, on Allopurinol 100mg qd.     

## 2019-11-07 NOTE — Assessment & Plan Note (Signed)
stable on Nexium 40mg  qd, Carafate qid

## 2019-11-07 NOTE — Assessment & Plan Note (Signed)
LDL 163 06/27/19, diet control.

## 2019-11-07 NOTE — Assessment & Plan Note (Signed)
Had neck and lower back scan done, f/u Neurosurgeon, L2-L3 inj, left radiculopathy, down to her left knee

## 2019-11-07 NOTE — Assessment & Plan Note (Signed)
Her mood is stable, on Alprazolam 0.25mg bid prn, Prozac 40mg qd. 

## 2019-11-07 NOTE — Assessment & Plan Note (Addendum)
HTN, blood pressure is controlled, on Ziac 10/6.25mg  qd. HR 58 bpm apical.

## 2019-11-07 NOTE — Assessment & Plan Note (Signed)
Left eye, presivision, f/u yearly

## 2019-11-08 ENCOUNTER — Encounter: Payer: Self-pay | Admitting: Nurse Practitioner

## 2019-11-21 ENCOUNTER — Ambulatory Visit
Admission: RE | Admit: 2019-11-21 | Discharge: 2019-11-21 | Disposition: A | Discharge status: Home / Self Care | Payer: Medicare PPO | Source: Ambulatory Visit | Attending: Internal Medicine | Admitting: Internal Medicine

## 2019-11-21 ENCOUNTER — Other Ambulatory Visit: Payer: Self-pay

## 2019-11-21 DIAGNOSIS — Z1231 Encounter for screening mammogram for malignant neoplasm of breast: Secondary | ICD-10-CM

## 2020-01-08 ENCOUNTER — Other Ambulatory Visit: Payer: Self-pay | Admitting: Nurse Practitioner

## 2020-01-08 NOTE — Telephone Encounter (Signed)
Patient is requesting refill on medication "Alprazolam". Medication last refilled 06/26/2019 with 60 tablets to be taken twice daily as needed with no refills. Medication pend and sent to provider Mast, Man X, NP . Please Advise.

## 2020-02-03 ENCOUNTER — Other Ambulatory Visit: Payer: Self-pay | Admitting: Nurse Practitioner

## 2020-02-03 ENCOUNTER — Other Ambulatory Visit: Payer: Self-pay | Admitting: Internal Medicine

## 2020-02-12 ENCOUNTER — Encounter: Payer: Self-pay | Admitting: Internal Medicine

## 2020-02-19 ENCOUNTER — Encounter: Payer: Self-pay | Admitting: Internal Medicine

## 2020-02-20 ENCOUNTER — Other Ambulatory Visit: Payer: Self-pay

## 2020-02-20 ENCOUNTER — Encounter: Payer: Self-pay | Admitting: Nurse Practitioner

## 2020-02-20 ENCOUNTER — Non-Acute Institutional Stay: Payer: Medicare PPO | Admitting: Nurse Practitioner

## 2020-02-20 DIAGNOSIS — I10 Essential (primary) hypertension: Secondary | ICD-10-CM | POA: Diagnosis not present

## 2020-02-20 DIAGNOSIS — E1159 Type 2 diabetes mellitus with other circulatory complications: Secondary | ICD-10-CM

## 2020-02-20 DIAGNOSIS — F419 Anxiety disorder, unspecified: Secondary | ICD-10-CM

## 2020-02-20 DIAGNOSIS — E669 Obesity, unspecified: Secondary | ICD-10-CM

## 2020-02-20 DIAGNOSIS — E1169 Type 2 diabetes mellitus with other specified complication: Secondary | ICD-10-CM

## 2020-02-20 DIAGNOSIS — E785 Hyperlipidemia, unspecified: Secondary | ICD-10-CM | POA: Diagnosis not present

## 2020-02-20 DIAGNOSIS — K219 Gastro-esophageal reflux disease without esophagitis: Secondary | ICD-10-CM | POA: Diagnosis not present

## 2020-02-20 DIAGNOSIS — M1A00X0 Idiopathic chronic gout, unspecified site, without tophus (tophi): Secondary | ICD-10-CM | POA: Diagnosis not present

## 2020-02-20 NOTE — Assessment & Plan Note (Signed)
GERD, stable on Nexium 40mg  qd, Carafate qid

## 2020-02-20 NOTE — Assessment & Plan Note (Signed)
HTN, blood pressure is controlled, on Ziac 10/6.25mg  qd.

## 2020-02-20 NOTE — Assessment & Plan Note (Signed)
T2DM,Hgb a1c 6.6 10/30/19, takes Metformin 500mg  qd.

## 2020-02-20 NOTE — Progress Notes (Signed)
Location:   clinic   Place of Service:  Clinic (12) Provider: Marlana Latus NP  Code Status: DNR Goals of Care:  Advanced Directives 03/05/2018  Does Patient Have a Medical Advance Directive? Yes  Type of Paramedic of [place];Living will;Out of facility DNR (pink MOST or yellow form)  Does patient want to make changes to medical advance directive? No - Patient declined  Copy of [place] in Chart? Yes - validated most recent copy scanned in chart (See row information)  Pre-existing out of facility DNR order (yellow form or pink MOST form) Yellow form placed in chart (order not valid for inpatient use);Pink MOST form placed in chart (order not valid for inpatient use)     Chief Complaint  Patient presents with  . Medical Management of Chronic Issues    Patient returns to the clinic for her 3 month follow up. She has no concerns.     HPI: Patient is a 79 y.o. female seen today for medical management of chronic diseases.     T2DM,Hgb a1c 6.6 10/30/19, takes Metformin 510m qd.              No acute gout flare ups, uric acid 4.9 01/01/19,on Allopurinol 1063mqd.      Her mood is stable, on Alprazolam 0.2551mid prn, Prozac 42m35m.              GERD, stable on Nexium 42mg85m Carafate qid             HTN, blood pressure is controlled, on Ziac 10/6.25mg qd.             LDL 163 06/27/19               Past Medical History:  Diagnosis Date  . Allergy   . Anemia   . Anxiety   . Arthritis    with gout  . Carotid bruit    right  . Cataract    bil cataracts removed  . Chest pain   . Chronic kidney disease   . Colon polyps   . Depression   . Diabetes type 2, uncontrolled ([facility]) [place] Diverticulosis   . Duodenitis   . Exogenous obesity   . Gastric ulcer   . GERD (gastroesophageal reflux disease)   . Hyperlipidemia   . Hypertension   . Metabolic syndrome   . Osteopenia   . Seasonal allergies    seasonal  . Tubular adenoma of  colon     Past Surgical History:  Procedure Laterality Date  . COLONOSCOPY     11/90,11/91,1994,2000,2010  . DILATION AND CURETTAGE OF UTERUS    . DILATION AND CURETTAGE OF UTERUS  1997  . FOOT SURGERY     x 2  . HAND SURGERY Right    Carpel Tunnel  . POLYPECTOMY    . TUBAL LIGATION    . uterine polyps removed     x2    Allergies  Allergen Reactions  . Crestor [Rosuvastatin Calcium]     Myalgias   . Gemfibrozil Itching  . Iodine     Skin rash  . Lipitor [Atorvastatin Calcium]     Myalgias   . Lovastatin     myalgias  . Sulfonamide Derivatives     REACTION: n\T\v  . Sulfur     Made infection worse  . Zetia [Ezetimibe]     Cough, dry    Allergies as of 02/20/2020  Reactions   Crestor [rosuvastatin Calcium]    Myalgias   Gemfibrozil Itching   Iodine    Skin rash   Lipitor [atorvastatin Calcium]    Myalgias   Lovastatin    myalgias   Sulfonamide Derivatives    REACTION: n\T\v   Sulfur    Made infection worse   Zetia [ezetimibe]    Cough, dry      Medication List       Accurate as of February 20, 2020 11:59 PM. If you have any questions, ask your nurse or doctor.        STOP taking these medications   docusate sodium 100 MG capsule Commonly known as: COLACE Stopped by: Shivangi Lutz X Tamey Wanek, NP     TAKE these medications   Accu-Chek FastClix Lancets Misc USE AS DIRECTED   Accu-Chek SmartView test strip Generic drug: glucose blood CHECK BLOOD SUGAR 3 TIMES DAILY AS DIRECTED.   acetaminophen 500 MG tablet Commonly known as: TYLENOL Take 500 mg by mouth every 6 (six) hours as needed.   allopurinol 100 MG tablet Commonly known as: ZYLOPRIM TAKE 2 TABLETS BY MOUTH ONCE DAILY.   ALPRAZolam 0.25 MG tablet Commonly known as: XANAX TAKE (1) TABLET TWICE A DAY AS NEEDED.   bisoprolol-hydrochlorothiazide 10-6.25 MG tablet Commonly known as: ZIAC TAKE 1 TABLET ONCE DAILY.   clobetasol cream 0.05 % Commonly known as: TEMOVATE Apply 1 application  topically as needed.   clobetasol cream 0.05 % Commonly known as: TEMOVATE Apply 1 application topically as needed.   esomeprazole 40 MG capsule Commonly known as: NEXIUM TAKE 1 CAPSULE TWICE DAILY BEFORE MEALS   Fish Oil 1000 MG Caps Take 1 capsule by mouth daily.   FLUoxetine 40 MG capsule Commonly known as: PROZAC TAKE (1) CAPSULE DAILY.   ICAPS AREDS 2 PO Take 1 capsule by mouth daily.   metFORMIN 500 MG tablet Commonly known as: GLUCOPHAGE TAKE 1 TABLET ONCE DAILY.   sucralfate 1 GM/10ML suspension Commonly known as: CARAFATE Take 10 mLs (1 g total) by mouth 4 (four) times daily.   Vitamin D3 50 MCG (2000 UT) capsule Take 2,000 Units by mouth daily.       Review of Systems:  Review of Systems  Constitutional: Positive for unexpected weight change. Negative for activity change, appetite change and fever.       Gradual weight gain, diet.   HENT: Positive for hearing loss. Negative for congestion and voice change.   Respiratory: Negative for cough and shortness of breath.   Cardiovascular: Negative for leg swelling.  Gastrointestinal: Negative for abdominal pain and constipation.  Genitourinary: Negative for difficulty urinating, dysuria and urgency.       1-2x/night  Musculoskeletal: Positive for arthralgias, back pain and gait problem. Negative for joint swelling.  Skin: Negative for color change.  Neurological: Negative for speech difficulty, weakness and headaches.  Psychiatric/Behavioral: Negative for behavioral problems and sleep disturbance. The patient is not nervous/anxious.     Health Maintenance  Topic Date Due  . FOOT EXAM  11/15/2017  . INFLUENZA VACCINE  10/20/2019  . HEMOGLOBIN A1C  05/01/2020  . MAMMOGRAM  11/20/2020  . TETANUS/TDAP  03/02/2025  . DEXA SCAN  Completed  . COVID-19 Vaccine  Completed  . PNA vac Low Risk Adult  Completed  . OPHTHALMOLOGY EXAM  Discontinued    Physical Exam: Vitals:   02/20/20 1323  BP: (!) 142/78    Pulse: 69  Temp: 97.6 F (36.4 C)  SpO2: 98%  Weight:  203 lb 6.4 oz (92.3 kg)  Height: _0  (1.575 m)   Body mass index is 37.2 kg/m. Physical Exam Constitutional:      Appearance: Normal appearance.  HENT:     Head: Normocephalic and atraumatic.     Nose: Nose normal.     Mouth/Throat:     Mouth: Mucous membranes are moist.  Eyes:     Extraocular Movements: Extraocular movements intact.     Conjunctiva/sclera: Conjunctivae normal.     Pupils: Pupils are equal, round, and reactive to light.  Cardiovascular:     Rate and Rhythm: Normal rate and regular rhythm.     Heart sounds: No murmur heard.      Comments: Weak DP pulses, will see Podiatry.  Pulmonary:     Breath sounds: No rales.  Abdominal:     General: Bowel sounds are normal.     Palpations: Abdomen is soft.     Tenderness: There is no abdominal tenderness.  Musculoskeletal:     Cervical back: Normal range of motion and neck supple.     Right lower leg: No edema.     Left lower leg: No edema.     Comments: Neck pain, lower back pain radiating to the left buttock/thigh/knee. Left 2nd hammer toe.   Skin:    General: Skin is warm and dry.  Neurological:     General: No focal deficit present.     Mental Status: She is alert and oriented to person, place, and time. Mental status is at baseline.  Psychiatric:        Mood and Affect: Mood normal.        Behavior: Behavior normal.        Thought Content: Thought content normal.        Judgment: Judgment normal.     Labs reviewed: Basic Metabolic Panel: Recent Labs    06/27/19 0730 10/30/19 0918  NA 140 137  K 4.5 4.3  CL 103 102  CO2 30 27  GLUCOSE 119* 108*  BUN 25 27*  CREATININE 1.18* 1.13*  CALCIUM 9.5 9.2  TSH 2.12  --    Liver Function Tests: Recent Labs    06/27/19 0730 10/30/19 0918  AST 15 13  ALT 13 13  BILITOT 0.6 0.7  PROT 6.4 6.1   No results for input(s): LIPASE, AMYLASE in the last 8760 hours. No results for input(s):  AMMONIA in the last 8760 hours. CBC: Recent Labs    06/27/19 0730  WBC 8.9  NEUTROABS 5,251  HGB 13.1  HCT 39.7  MCV 93.0  PLT 283   Lipid Panel: Recent Labs    06/27/19 0730  CHOL 246*  HDL 48*  LDLCALC 163*  TRIG 191*  CHOLHDL 5.1*   Lab Results  Component Value Date   HGBA1C 6.6 (H) 10/30/2019    Procedures since last visit: No results found.  Assessment/Plan  Diabetes mellitus type 2 in obese ([facility]CC) T2DM,Hgb a1c 6.6 10/30/19, takes Metformin 513m qd.   Gout No acute gout flare ups, uric acid 4.9 01/01/19,on Allopurinol 1038mqd.      Anxiety Her mood is stable, on Alprazolam 0.2547mid prn, Prozac 3m82m.   Chronic GERD GERD, stable on Nexium 3mg68m Carafate qid   Essential hypertension HTN, blood pressure is controlled, on Ziac 10/6.25mg qd.   Dyslipidemia LDL 163 06/27/19 Will update lipid panel, Hgb a1c, CBC/diff, CMP/eGFR   Labs/tests ordered:  * No order type specified * Next appt:  Visit  date not found

## 2020-02-20 NOTE — Assessment & Plan Note (Signed)
Her mood is stable, on Alprazolam 0.25mg bid prn, Prozac 40mg qd. 

## 2020-02-20 NOTE — Assessment & Plan Note (Signed)
No acute gout flare ups, uric acid 4.9 01/01/19, on Allopurinol 100mg qd.     

## 2020-02-20 NOTE — Assessment & Plan Note (Signed)
LDL 163 06/27/19 Will update lipid panel, Hgb a1c, CBC/diff, CMP/eGFR 

## 2020-02-21 ENCOUNTER — Encounter: Payer: Self-pay | Admitting: Nurse Practitioner

## 2020-04-13 ENCOUNTER — Other Ambulatory Visit: Payer: Self-pay | Admitting: Nurse Practitioner

## 2020-04-23 ENCOUNTER — Other Ambulatory Visit: Payer: Self-pay

## 2020-04-23 ENCOUNTER — Ambulatory Visit (INDEPENDENT_AMBULATORY_CARE_PROVIDER_SITE_OTHER): Payer: Medicare PPO | Admitting: Nurse Practitioner

## 2020-04-23 ENCOUNTER — Telehealth: Payer: Self-pay

## 2020-04-23 ENCOUNTER — Encounter: Payer: Self-pay | Admitting: Nurse Practitioner

## 2020-04-23 DIAGNOSIS — Z0000 Encounter for general adult medical examination without abnormal findings: Secondary | ICD-10-CM | POA: Diagnosis not present

## 2020-04-23 DIAGNOSIS — E2839 Other primary ovarian failure: Secondary | ICD-10-CM

## 2020-04-23 NOTE — Progress Notes (Signed)
Subjective:   Brittany Mcclure is a 80 y.o. female who presents for Medicare Annual (Subsequent) preventive examination.  Review of Systems     Cardiac Risk Factors include: advanced age (>19men, >92 women);obesity (BMI >30kg/m2);hypertension;dyslipidemia;diabetes mellitus     Objective:    There were no vitals filed for this visit. There is no height or weight on file to calculate BMI.  Advanced Directives 04/23/2020 03/05/2018 10/17/2017 02/21/2017 02/21/2017 11/15/2016 01/21/2015  Does Patient Have a Medical Advance Directive? Yes Yes Yes Yes Yes Yes Yes  Type of Paramedic of [place];Living will;Out of facility DNR (pink MOST or yellow form) [place];Living will;Out of facility DNR (pink MOST or yellow form) [place];Living will;Out of facility DNR (pink MOST or yellow form) [place];Out of facility DNR (pink MOST or yellow form) - [place];Living will;Out of facility DNR (pink MOST or yellow form) -  Does patient want to make changes to medical advance directive? No - Patient declined No - Patient declined No - Patient declined No - Patient declined - No - Patient declined -  Copy of [place] in Chart? Yes - validated most recent copy scanned in chart (See row information) Yes - validated most recent copy scanned in chart (See row information) Yes Yes - Yes -  Pre-existing out of facility DNR order (yellow form or pink MOST form) - Yellow form placed in chart (order not valid for inpatient use);Pink MOST form placed in chart (order not valid for inpatient use) Yellow form placed in chart (order not valid for inpatient use) Yellow form placed in chart (order not valid for inpatient use);Pink MOST form placed in chart (order not valid for inpatient use) - - -    Current Medications (verified) Outpatient Encounter Medications as of 04/23/2020  Medication Sig  . Accu-Chek  FastClix Lancets MISC USE AS DIRECTED  . ACCU-CHEK SMARTVIEW test strip CHECK BLOOD SUGAR 3 TIMES DAILY AS DIRECTED.  Marland Kitchen acetaminophen (TYLENOL) 500 MG tablet Take 500 mg by mouth every 6 (six) hours as needed.  Marland Kitchen allopurinol (ZYLOPRIM) 100 MG tablet TAKE 2 TABLETS BY MOUTH ONCE DAILY.  Marland Kitchen ALPRAZolam (XANAX) 0.25 MG tablet TAKE (1) TABLET TWICE A DAY AS NEEDED.  . bisoprolol-hydrochlorothiazide (ZIAC) 10-6.25 MG tablet TAKE 1 TABLET ONCE DAILY.  Marland Kitchen Cholecalciferol (VITAMIN D3) 50 MCG (2000 UT) capsule Take 2,000 Units by mouth daily.  . clobetasol cream (TEMOVATE) 7.89 % Apply 1 application topically as needed.   Marland Kitchen esomeprazole (NEXIUM) 40 MG capsule TAKE 1 CAPSULE TWICE DAILY BEFORE MEALS  . FLUoxetine (PROZAC) 40 MG capsule TAKE (1) CAPSULE DAILY.  . metFORMIN (GLUCOPHAGE) 500 MG tablet TAKE 1 TABLET ONCE DAILY.  . Multiple Vitamins-Minerals (ICAPS AREDS 2 PO) Take 1 capsule by mouth daily.  . Omega-3 Fatty Acids (FISH OIL) 1000 MG CAPS Take 1 capsule by mouth daily.  . Probiotic Product (PROBIOTIC DAILY PO) Take 1 capsule by mouth daily.  . sucralfate (CARAFATE) 1 GM/10ML suspension Take 10 mLs (1 g total) by mouth 4 (four) times daily. (Patient not taking: Reported on 04/23/2020)  . [DISCONTINUED] clobetasol cream (TEMOVATE) 3.81 % Apply 1 application topically as needed.   No facility-administered encounter medications on file as of 04/23/2020.    Allergies (verified) Crestor [rosuvastatin calcium], Elemental sulfur, Gemfibrozil, Iodine, Lipitor [atorvastatin calcium], Lovastatin, Sulfonamide derivatives, and Zetia [ezetimibe]   History: Past Medical History:  Diagnosis Date  . Allergy   .  Anemia   . Anxiety   . Arthritis    with gout  . Carotid bruit    right  . Cataract    bil cataracts removed  . Chest pain   . Chronic kidney disease   . Colon polyps   . Depression   . Diabetes type 2, uncontrolled ([place])   . Diverticulosis   . Duodenitis   . Exogenous obesity   . Gastric  ulcer   . GERD (gastroesophageal reflux disease)   . Hyperlipidemia   . Hypertension   . Metabolic syndrome   . Osteopenia   . Seasonal allergies    seasonal  . Tubular adenoma of colon    Past Surgical History:  Procedure Laterality Date  . COLONOSCOPY     11/90,11/91,1994,2000,2010  . DILATION AND CURETTAGE OF UTERUS    . DILATION AND CURETTAGE OF UTERUS  1997  . FOOT SURGERY     x 2  . HAND SURGERY Right    Carpel Tunnel  . POLYPECTOMY    . TUBAL LIGATION    . uterine polyps removed     x2   Family History  Problem Relation Age of Onset  . Colon cancer Mother   . Stroke Father   . Hypertension Father   . Heart failure Maternal Grandmother   . Diabetes Maternal Grandmother        ?  Marland Kitchen Rectal cancer Neg Hx   . Stomach cancer Neg Hx   . Esophageal cancer Neg Hx   . Pancreatic cancer Neg Hx    Social History   Socioeconomic History  . Marital status: Widowed    Spouse name: Not on file  . Number of children: 2  . Years of education: Not on file  . Highest education level: Not on file  Occupational History  . Occupation: retired    Fish farm manager: RETIRED  Tobacco Use  . Smoking status: Former Research scientist (life sciences)  . Smokeless tobacco: Never Used  . Tobacco comment: Late 20's or early 30's  Vaping Use  . Vaping Use: Never used  Substance and Sexual Activity  . Alcohol use: Yes    Alcohol/week: 0.0 standard drinks    Comment: socially  . Drug use: No  . Sexual activity: Not on file  Other Topics Concern  . Not on file  Social History Narrative   Tobacco use, amount per day now: None      Past tobacco use, amount per day: in her late 35s a pack per day      How many years did you use tobacco: 10      Alcohol use (drinks per week): occasional wine      Diet:      Do you drink/eat things with caffeine? Tea, soft drinks and coffee      Marital status: Married             What year were you married? 1962      Do you live in a house, apartment, assisted living, condo,  trailer? Apartment      Is it one or more stories? Yes      How many persons live in your home? 0      Do you have any pets in your home? 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 Yes            How often? Water aerobics  Do you have a living will? Yes      Do you have a DNR form? Yes           If not, do you want to discuss one?      Do you have signed POA/HPOA forms?  Yes            Social Determinants of Health   Financial Resource Strain: Not on file  Food Insecurity: Not on file  Transportation Needs: Not on file  Physical Activity: Not on file  Stress: Not on file  Social Connections: Not on file    Tobacco Counseling Counseling given: Not Answered Comment: Late 20's or early 30's   Clinical Intake:  Pre-visit preparation completed: Yes  Pain : No/denies pain     BMI - recorded: 37 Nutritional Status: BMI > 30  Obese Diabetes: Yes  How often do you need to have someone help you when you read instructions, pamphlets, or other written materials from your doctor or pharmacy?: 1 - Never  Diabetic?yes         Activities of Daily Living In your present state of health, do you have any difficulty performing the following activities: 04/23/2020  Hearing? N  Vision? N  Difficulty concentrating or making decisions? N  Walking or climbing stairs? N  Dressing or bathing? N  Doing errands, shopping? N  Preparing Food and eating ? N  Using the Toilet? N  In the past six months, have you accidently leaked urine? N  Do you have problems with loss of bowel control? N  Managing your Medications? N  Managing your Finances? N  Housekeeping or managing your Housekeeping? N  Some recent data might be hidden    Patient Care Team: Mast, Man X, NP as PCP - General (Internal Medicine) Iran Planas, MD as Consulting Physician (Orthopedic Surgery) Marygrace Drought, MD as Consulting Physician (Ophthalmology)  Indicate any  recent Medical Services you may have received from other than [facility] providers in the past year (date may be approximate).     Assessment:   This is a routine wellness examination for Brittany Mcclure.  Hearing/Vision screen  Hearing Screening   125Hz  250Hz  500Hz  1000Hz  2000Hz  3000Hz  4000Hz  6000Hz  8000Hz   Right ear:           Left ear:           Comments: Patient wears bilateral hearing aids   Vision Screening Comments: Pending eye exam March 2022  Dietary issues and exercise activities discussed: Type of exercise: walking, Time (Minutes): 30, Frequency (Times/Week): 7, Weekly Exercise (Minutes/Week): 210  Goals    . Patient Stated     Patient will start journaling and exploring self expressive practices.    Marland Kitchen weight loss     Currently 203 lbs but would like to gradually lose weight. To stay below 200 lbs.       Depression Screen PHQ 2/9 Scores 04/23/2020 03/05/2018 01/18/2018 02/21/2017 06/12/2015 02/05/2015 01/28/2015  PHQ - 2 Score 0 0 0 1 0 0 0    Fall Risk Fall Risk  04/23/2020 02/20/2020 11/07/2019 07/05/2019 01/03/2019  Falls in the past year? 1 0 0 1 0  Number falls in past yr: 0 0 0 0 0  Injury with Fall? 0 - - - -    FALL RISK PREVENTION PERTAINING TO THE HOME:  Any stairs in or around the home? Yes  If so, are there any without handrails? No  Home free of loose throw rugs in walkways,  pet beds, electrical cords, etc? Yes  Adequate lighting in your home to reduce risk of falls? Yes   ASSISTIVE DEVICES UTILIZED TO PREVENT FALLS:  Life alert? Yes  Use of a cane, walker or w/c? No  Grab bars in the bathroom? Yes  Shower chair or bench in shower? No  Elevated toilet seat or a handicapped toilet? No   TIMED UP AND GO:  Was the test performed? No .    Cognitive Function: MMSE - Mini Mental State Exam 03/05/2018 02/21/2017  Orientation to time 5 4  Orientation to Place 5 5  Registration 3 3  Attention/ Calculation 5 5  Recall 3 2  Language- name 2 objects 2 2  Language-  repeat 1 1  Language- follow 3 step command 3 3  Language- read & follow direction 1 1  Write a sentence 1 1  Copy design 1 1  Total score 30 28     6CIT Screen 04/23/2020  What Year? 0 points  What month? 0 points  What time? 0 points  Count back from 20 0 points  Months in reverse 4 points  Repeat phrase 2 points  Total Score 6    Immunizations Immunization History  Administered Date(s) Administered  . Fluad Quad(high Dose 65+) 12/27/2018  . Influenza Whole 12/27/2010  . Influenza, High Dose Seasonal PF 12/06/2017  . Influenza-Unspecified 10/20/2014, 12/19/2016  . Moderna SARS-COV2 Booster Vaccination 01/28/2020  . Moderna Sars-Covid-2 Vaccination 03/25/2019, 04/22/2019  . Pneumococcal-Unspecified 09/19/2014  . Tdap 03/03/2015  . Zoster 09/19/2014  . Zoster Recombinat (Shingrix) 12/06/2017, 02/20/2018    TDAP status: Up to date  Flu Vaccine status: Up to date  Pneumococcal vaccine status: Due, Education has been provided regarding the importance of this vaccine. Advised may receive this vaccine at local pharmacy or Health Dept. Aware to provide a copy of the vaccination record if obtained from local pharmacy or Health Dept. Verbalized acceptance and understanding.  Covid-19 vaccine status: Completed vaccines  Qualifies for Shingles Vaccine? Yes   Zostavax completed Yes   Shingrix Completed?: Yes  Screening Tests Health Maintenance  Topic Date Due  . FOOT EXAM  11/15/2017  . INFLUENZA VACCINE  10/20/2019  . HEMOGLOBIN A1C  05/01/2020  . MAMMOGRAM  11/20/2020  . TETANUS/TDAP  03/02/2025  . DEXA SCAN  Completed  . COVID-19 Vaccine  Completed  . PNA vac Low Risk Adult  Completed  . OPHTHALMOLOGY EXAM  Discontinued    Health Maintenance  Health Maintenance Due  Topic Date Due  . FOOT EXAM  11/15/2017  . INFLUENZA VACCINE  10/20/2019    Colorectal cancer screening: No longer required.   Mammogram status: No longer required due to age.  Bone Density  status: Ordered today. Pt provided with contact info and advised to call to schedule appt.  Lung Cancer Screening: (Low Dose CT Chest recommended if Age 64-80 years, 30 pack-year currently smoking OR have quit w/in 15years.) does not qualify.   Additional Screening:  Hepatitis C Screening: does not qualify;   Vision Screening: Recommended annual ophthalmology exams for early detection of glaucoma and other disorders of the eye. Is the patient up to date with their annual eye exam?  Yes  Who is the provider or what is the name of the office in which the patient attends annual eye exams? [facility] If pt is not established with a provider, would they like to be referred to a provider to establish care? No .   Dental Screening: Recommended annual  dental exams for proper oral hygiene  Community Resource Referral / Chronic Care Management: CRR required this visit?  No   CCM required this visit?  No      Plan:     I have personally reviewed and noted the following in the patient's chart:   . Medical and social history . Use of alcohol, tobacco or illicit drugs  . Current medications and supplements . Functional ability and status . Nutritional status . Physical activity . Advanced directives . List of other physicians . Hospitalizations, surgeries, and ER visits in previous 12 months . Vitals . Screenings to include cognitive, depression, and falls . Referrals and appointments  In addition, I have reviewed and discussed with patient certain preventive protocols, quality metrics, and best practice recommendations. A written personalized care plan for preventive services as well as general preventive health recommendations were provided to patient.     Lauree Chandler, NP   04/23/2020    Virtual Visit via Telephone Note  I connected with@ on 04/23/20 at  3:15 PM EST by telephone and verified that I am speaking with the correct person using two identifiers.  Location: Patient:  home Provider: [facility]    I discussed the limitations, risks, security and privacy concerns of performing an evaluation and management service by telephone and the availability of in person appointments. I also discussed with the patient that there may be a patient responsible charge related to this service. The patient expressed understanding and agreed to proceed.   I discussed the assessment and treatment plan with the patient. The patient was provided an opportunity to ask questions and all were answered. The patient agreed with the plan and demonstrated an understanding of the instructions.   The patient was advised to call back or seek an in-person evaluation if the symptoms worsen or if the condition fails to improve as anticipated.  I provided 18 minutes of non-face-to-face time during this encounter.  Carlos [employer]. Harle Battiest [employer] printed and mailed

## 2020-04-23 NOTE — Patient Instructions (Signed)
Brittany Mcclure , Thank you for taking time to come for your Medicare Wellness Visit. I appreciate your ongoing commitment to your health goals. Please review the following plan we discussed and let me know if I can assist you in the future.   Screening recommendations/referrals: Colonoscopy aged out Mammogram aged out Bone Density ordered- call [phone] to schedule.  Recommended yearly ophthalmology/optometry visit for glaucoma screening and checkup Recommended yearly dental visit for hygiene and checkup  Vaccinations: Influenza vaccine up to date Pneumococcal vaccine - recommend to have Pneumococcal 23 and Prevnar 13 once after the age of 74 Tdap vaccine up to date Shingles vaccine up to date    Advanced directives: on file.   Conditions/risks identified: advanced age, hypertension, obesity, hyperlipidemia.   Next appointment: 1 year for AWV   Preventive Care 39 Years and Older, Female Preventive care refers to lifestyle choices and visits with your health care provider that can promote health and wellness. What does preventive care include?  A yearly physical exam. This is also called an annual well check.  Dental exams once or twice a year.  Routine eye exams. Ask your health care provider how often you should have your eyes checked.  Personal lifestyle choices, including:  Daily care of your teeth and gums.  Regular physical activity.  Eating a healthy diet.  Avoiding tobacco and drug use.  Limiting alcohol use.  Practicing safe sex.  Taking low-dose aspirin every day.  Taking vitamin and mineral supplements as recommended by your health care provider. What happens during an annual well check? The services and screenings done by your health care provider during your annual well check will depend on your age, overall health, lifestyle risk factors, and family history of disease. Counseling  Your health care provider may ask you questions about your:  Alcohol  use.  Tobacco use.  Drug use.  Emotional well-being.  Home and relationship well-being.  Sexual activity.  Eating habits.  History of falls.  Memory and ability to understand (cognition).  Work and work Statistician.  Reproductive health. Screening  You may have the following tests or measurements:  Height, weight, and BMI.  Blood pressure.  Lipid and cholesterol levels. These may be checked every 5 years, or more frequently if you are over 67 years old.  Skin check.  Lung cancer screening. You may have this screening every year starting at age 59 if you have a 30-pack-year history of smoking and currently smoke or have quit within the past 15 years.  Fecal occult blood test (FOBT) of the stool. You may have this test every year starting at age 96.  Flexible sigmoidoscopy or colonoscopy. You may have a sigmoidoscopy every 5 years or a colonoscopy every 10 years starting at age 13.  Hepatitis C blood test.  Hepatitis B blood test.  Sexually transmitted disease (STD) testing.  Diabetes screening. This is done by checking your blood sugar (glucose) after you have not eaten for a while (fasting). You may have this done every 1-3 years.  Bone density scan. This is done to screen for osteoporosis. You may have this done starting at age 41.  Mammogram. This may be done every 1-2 years. Talk to your health care provider about how often you should have regular mammograms. Talk with your health care provider about your test results, treatment options, and if necessary, the need for more tests. Vaccines  Your health care provider may recommend certain vaccines, such as:  Influenza vaccine. This is recommended  every year.  Tetanus, diphtheria, and acellular pertussis (Tdap, Td) vaccine. You may need a Td booster every 10 years.  Zoster vaccine. You may need this after age 21.  Pneumococcal 13-valent conjugate (PCV13) vaccine. One dose is recommended after age  23.  Pneumococcal polysaccharide (PPSV23) vaccine. One dose is recommended after age 30. Talk to your health care provider about which screenings and vaccines you need and how often you need them. This information is not intended to replace advice given to you by your health care provider. Make sure you discuss any questions you have with your health care provider. Document Released: 04/03/2015 Document Revised: 11/25/2015 Document Reviewed: 01/06/2015 Elsevier Interactive Patient Education  2017 [place] Prevention in the Home Falls can cause injuries. They can happen to people of all ages. There are many things you can do to make your home safe and to help prevent falls. What can I do on the outside of my home?  Regularly fix the edges of walkways and driveways and fix any cracks.  Remove anything that might make you trip as you walk through a door, such as a raised step or threshold.  Trim any bushes or trees on the path to your home.  Use bright outdoor lighting.  Clear any walking paths of anything that might make someone trip, such as rocks or tools.  Regularly check to see if handrails are loose or broken. Make sure that both sides of any steps have handrails.  Any raised decks and porches should have guardrails on the edges.  Have any leaves, snow, or ice cleared regularly.  Use sand or salt on walking paths during winter.  Clean up any spills in your garage right away. This includes oil or grease spills. What can I do in the bathroom?  Use night lights.  Install grab bars by the toilet and in the tub and shower. Do not use towel bars as grab bars.  Use non-skid mats or decals in the tub or shower.  If you need to sit down in the shower, use a plastic, non-slip stool.  Keep the floor dry. Clean up any water that spills on the floor as soon as it happens.  Remove soap buildup in the tub or shower regularly.  Attach bath mats securely with double-sided  non-slip rug tape.  Do not have throw rugs and other things on the floor that can make you trip. What can I do in the bedroom?  Use night lights.  Make sure that you have a light by your bed that is easy to reach.  Do not use any sheets or blankets that are too big for your bed. They should not hang down onto the floor.  Have a firm chair that has side arms. You can use this for support while you get dressed.  Do not have throw rugs and other things on the floor that can make you trip. What can I do in the kitchen?  Clean up any spills right away.  Avoid walking on wet floors.  Keep items that you use a lot in easy-to-reach places.  If you need to reach something above you, use a strong step stool that has a grab bar.  Keep electrical cords out of the way.  Do not use floor polish or wax that makes floors slippery. If you must use wax, use non-skid floor wax.  Do not have throw rugs and other things on the floor that can make you trip. What  can I do with my stairs?  Do not leave any items on the stairs.  Make sure that there are handrails on both sides of the stairs and use them. Fix handrails that are broken or loose. Make sure that handrails are as long as the stairways.  Check any carpeting to make sure that it is firmly attached to the stairs. Fix any carpet that is loose or worn.  Avoid having throw rugs at the top or bottom of the stairs. If you do have throw rugs, attach them to the floor with carpet tape.  Make sure that you have a light switch at the top of the stairs and the bottom of the stairs. If you do not have them, ask someone to add them for you. What else can I do to help prevent falls?  Wear shoes that:  Do not have high heels.  Have rubber bottoms.  Are comfortable and fit you well.  Are closed at the toe. Do not wear sandals.  If you use a stepladder:  Make sure that it is fully opened. Do not climb a closed stepladder.  Make sure that both  sides of the stepladder are locked into place.  Ask someone to hold it for you, if possible.  Clearly mark and make sure that you can see:  Any grab bars or handrails.  First and last steps.  Where the edge of each step is.  Use tools that help you move around (mobility aids) if they are needed. These include:  Canes.  Walkers.  Scooters.  Crutches.  Turn on the lights when you go into a dark area. Replace any light bulbs as soon as they burn out.  Set up your furniture so you have a clear path. Avoid moving your furniture around.  If any of your floors are uneven, fix them.  If there are any pets around you, be aware of where they are.  Review your medicines with your doctor. Some medicines can make you feel dizzy. This can increase your chance of falling. Ask your doctor what other things that you can do to help prevent falls. This information is not intended to replace advice given to you by your health care provider. Make sure you discuss any questions you have with your health care provider. Document Released: 01/01/2009 Document Revised: 08/13/2015 Document Reviewed: 04/11/2014 Elsevier Interactive Patient Education  2017 [employer].

## 2020-04-23 NOTE — Telephone Encounter (Signed)
[name], pata are scheduled for a virtual visit with your provider today.    Just as we do with appointments in the office, we must obtain your consent to participate.  Your consent will be active for this visit and any virtual visit you may have with one of our providers in the next 365 days.    If you have a MyChart account, I can also send a copy of this consent to you electronically.  All virtual visits are billed to your insurance company just like a traditional visit in the office.  As this is a virtual visit, video technology does not allow for your provider to perform a traditional examination.  This may limit your provider's ability to fully assess your condition.  If your provider identifies any concerns that need to be evaluated in person or the need to arrange testing such as labs, EKG, etc, we will make arrangements to do so.    Although advances in technology are sophisticated, we cannot ensure that it will always work on either your end or our end.  If the connection with a video visit is poor, we may have to switch to a telephone visit.  With either a video or telephone visit, we are not always able to ensure that we have a secure connection.   I need to obtain your verbal consent now.   Are you willing to proceed with your visit today?   Brittany Mcclure has provided verbal consent on 04/23/2020 for a virtual visit (video or telephone).   [name] [place], [place] 04/23/2020  3:11 PM

## 2020-04-23 NOTE — Progress Notes (Signed)
   This service is provided via telemedicine  No vital signs collected/recorded due to the encounter was a telemedicine visit.   Location of patient (ex: home, work):  Home  Patient consents to a telephone visit: Yes, see telephone visit dated 04/23/20  Location of the provider (ex: office, home):  [facility], Office   Name of any referring provider:  N/A  Names of all persons participating in the telemedicine service and their role in the encounter:  S.Chrae B/CMA, Sherrie Mustache, NP, and Patient   Time spent on call:  8 min with medical assistant

## 2020-05-05 ENCOUNTER — Other Ambulatory Visit: Payer: Self-pay | Admitting: Internal Medicine

## 2020-05-05 ENCOUNTER — Other Ambulatory Visit: Payer: Self-pay | Admitting: Nurse Practitioner

## 2020-05-07 ENCOUNTER — Other Ambulatory Visit: Payer: Medicare PPO

## 2020-06-02 ENCOUNTER — Other Ambulatory Visit: Payer: Self-pay

## 2020-06-02 DIAGNOSIS — K219 Gastro-esophageal reflux disease without esophagitis: Secondary | ICD-10-CM | POA: Diagnosis not present

## 2020-06-02 DIAGNOSIS — E669 Obesity, unspecified: Secondary | ICD-10-CM

## 2020-06-02 DIAGNOSIS — I10 Essential (primary) hypertension: Secondary | ICD-10-CM

## 2020-06-02 DIAGNOSIS — E785 Hyperlipidemia, unspecified: Secondary | ICD-10-CM

## 2020-06-02 DIAGNOSIS — E1169 Type 2 diabetes mellitus with other specified complication: Secondary | ICD-10-CM

## 2020-06-03 LAB — LIPID PANEL
Cholesterol: 287 mg/dL — ABNORMAL HIGH (ref <200)
HDL: 47 mg/dL — ABNORMAL LOW (ref >=50)
LDL Cholesterol (Calc): 189 mg/dL (calc) — ABNORMAL HIGH
Non-HDL Cholesterol (Calc): 240 mg/dL (calc) — ABNORMAL HIGH (ref <130)
Total CHOL/HDL Ratio: 6.1 (calc) — ABNORMAL HIGH (ref <5.0)
Triglycerides: 300 mg/dL — ABNORMAL HIGH (ref <150)

## 2020-06-03 LAB — COMPLETE METABOLIC PANEL WITH GFR
AG Ratio: 2 (calc) (ref 1.0–2.5)
ALT: 12 U/L (ref 6–29)
AST: 14 U/L (ref 10–35)
Albumin: 4.2 g/dL (ref 3.6–5.1)
Alkaline phosphatase (APISO): 100 U/L (ref 37–153)
BUN/Creatinine Ratio: 24 (calc) — ABNORMAL HIGH (ref 6–22)
BUN: 28 mg/dL — ABNORMAL HIGH (ref 7–25)
CO2: 27 mmol/L (ref 20–32)
Calcium: 9.4 mg/dL (ref 8.6–10.4)
Chloride: 106 mmol/L (ref 98–110)
Creat: 1.15 mg/dL — ABNORMAL HIGH (ref 0.60–0.88)
GFR, Est African American: 52 mL/min/{1.73_m2} — ABNORMAL LOW (ref >=60)
GFR, Est Non African American: 45 mL/min/{1.73_m2} — ABNORMAL LOW (ref >=60)
Globulin: 2.1 g/dL (calc) (ref 1.9–3.7)
Glucose, Bld: 106 mg/dL — ABNORMAL HIGH (ref 65–99)
Potassium: 5 mmol/L (ref 3.5–5.3)
Sodium: 140 mmol/L (ref 135–146)
Total Bilirubin: 0.5 mg/dL (ref 0.2–1.2)
Total Protein: 6.3 g/dL (ref 6.1–8.1)

## 2020-06-03 LAB — CBC WITH DIFFERENTIAL/PLATELET
Absolute Monocytes: 774 cells/uL (ref 200–950)
Basophils Absolute: 62 cells/uL (ref 0–200)
Basophils Relative: 0.7 %
Eosinophils Absolute: 312 cells/uL (ref 15–500)
Eosinophils Relative: 3.5 %
HCT: 34.2 % — ABNORMAL LOW (ref 35.0–45.0)
Hemoglobin: 11.6 g/dL — ABNORMAL LOW (ref 11.7–15.5)
Lymphs Abs: 3160 cells/uL (ref 850–3900)
MCH: 31.5 pg (ref 27.0–33.0)
MCHC: 33.9 g/dL (ref 32.0–36.0)
MCV: 92.9 fL (ref 80.0–100.0)
MPV: 10.5 fL (ref 7.5–12.5)
Monocytes Relative: 8.7 %
Neutro Abs: 4592 cells/uL (ref 1500–7800)
Neutrophils Relative %: 51.6 %
Platelets: 269 10*3/uL (ref 140–400)
RBC: 3.68 10*6/uL — ABNORMAL LOW (ref 3.80–5.10)
RDW: 13.8 % (ref 11.0–15.0)
Total Lymphocyte: 35.5 %
WBC: 8.9 10*3/uL (ref 3.8–10.8)

## 2020-06-03 LAB — HEMOGLOBIN A1C
Hgb A1c MFr Bld: 6.6 % of total Hgb — ABNORMAL HIGH (ref <5.7)
Mean Plasma Glucose: 143 mg/dL
eAG (mmol/L): 7.9 mmol/L

## 2020-06-05 ENCOUNTER — Other Ambulatory Visit: Payer: Self-pay

## 2020-06-05 ENCOUNTER — Non-Acute Institutional Stay: Payer: Medicare PPO | Admitting: Internal Medicine

## 2020-06-05 ENCOUNTER — Encounter: Payer: Self-pay | Admitting: Internal Medicine

## 2020-06-05 VITALS — BP 158/88 | HR 49 | Temp 97.4°F | Ht 62.0 in | Wt 200.8 lb

## 2020-06-05 DIAGNOSIS — N1831 Chronic kidney disease, stage 3a: Secondary | ICD-10-CM

## 2020-06-05 DIAGNOSIS — F419 Anxiety disorder, unspecified: Secondary | ICD-10-CM

## 2020-06-05 DIAGNOSIS — M1A00X0 Idiopathic chronic gout, unspecified site, without tophus (tophi): Secondary | ICD-10-CM

## 2020-06-05 DIAGNOSIS — M5030 Other cervical disc degeneration, unspecified cervical region: Secondary | ICD-10-CM | POA: Diagnosis not present

## 2020-06-05 DIAGNOSIS — I10 Essential (primary) hypertension: Secondary | ICD-10-CM | POA: Diagnosis not present

## 2020-06-05 DIAGNOSIS — K219 Gastro-esophageal reflux disease without esophagitis: Secondary | ICD-10-CM

## 2020-06-05 DIAGNOSIS — E1121 Type 2 diabetes mellitus with diabetic nephropathy: Secondary | ICD-10-CM

## 2020-06-05 DIAGNOSIS — E785 Hyperlipidemia, unspecified: Secondary | ICD-10-CM | POA: Diagnosis not present

## 2020-06-05 NOTE — Progress Notes (Signed)
Location:  [place] of Service:  Clinic (12)  Provider:   Code Status:  Goals of Care:  Advanced Directives 04/23/2020  Does Patient Have a Medical Advance Directive? Yes  Type of Paramedic of [place];Living will;Out of facility DNR (pink MOST or yellow form)  Does patient want to make changes to medical advance directive? No - Patient declined  Copy of [place] in Chart? Yes - validated most recent copy scanned in chart (See row information)  Pre-existing out of facility DNR order (yellow form or pink MOST form) -     Chief Complaint  Patient presents with  . Medical Management of Chronic Issues    Patient returns to the clinic for follow up. She recently had back injections. She has no concerns today.     HPI: Patient is a 80 y.o. female seen today for medical management of chronic diseases.    Has h/o Hypertension, Hyperlipidemia, Gout, Type 2 Diabetes, Depression with Anxiety, GERD, Arthritis Has history of neck pain and low back pain.  S/p epidural injection by neurosurgery Blood sugars at home mostly 1 20-1 40.  Has not had any acute complaints today.  Walking well no falls.  Lives in [place] by herself. Has one daughter in [place] who is POA Son in [place] [address]. Independent   Past Medical History:  Diagnosis Date  . Allergy   . Anemia   . Anxiety   . Arthritis    with gout  . Carotid bruit    right  . Cataract    bil cataracts removed  . Chest pain   . Chronic kidney disease   . Colon polyps   . Depression   . Diabetes type 2, uncontrolled ([place])   . Diverticulosis   . Duodenitis   . Exogenous obesity   . Gastric ulcer   . GERD (gastroesophageal reflux disease)   . Hyperlipidemia   . Hypertension   . Metabolic syndrome   . Osteopenia   . Seasonal allergies    seasonal  . Tubular adenoma of colon     Past Surgical History:  Procedure Laterality Date  . COLONOSCOPY      11/90,11/91,1994,2000,2010  . DILATION AND CURETTAGE OF UTERUS    . DILATION AND CURETTAGE OF UTERUS  1997  . FOOT SURGERY     x 2  . HAND SURGERY Right    Carpel Tunnel  . POLYPECTOMY    . TUBAL LIGATION    . uterine polyps removed     x2    Allergies  Allergen Reactions  . Crestor [Rosuvastatin Calcium]     Myalgias   . Elemental Sulfur     Made infection worse  . Gemfibrozil Itching  . Iodine     Skin rash  . Lipitor [Atorvastatin Calcium]     Myalgias   . Lovastatin     myalgias  . Sulfonamide Derivatives     REACTION: n\T\v  . Zetia [Ezetimibe]     Cough, dry    Outpatient Encounter Medications as of 06/05/2020  Medication Sig  . Accu-Chek FastClix Lancets MISC USE AS DIRECTED  . ACCU-CHEK SMARTVIEW test strip CHECK BLOOD SUGAR 3 TIMES DAILY AS DIRECTED.  Marland Kitchen acetaminophen (TYLENOL) 500 MG tablet Take 500 mg by mouth every 6 (six) hours as needed.  Marland Kitchen allopurinol (ZYLOPRIM) 100 MG tablet TAKE 2 TABLETS BY MOUTH ONCE DAILY.  Marland Kitchen ALPRAZolam (XANAX) 0.25 MG tablet TAKE (1)  TABLET TWICE A DAY AS NEEDED.  . bisoprolol-hydrochlorothiazide (ZIAC) 10-6.25 MG tablet TAKE 1 TABLET ONCE DAILY.  Marland Kitchen Cholecalciferol (VITAMIN D3) 50 MCG (2000 UT) capsule Take 2,000 Units by mouth daily.  . clobetasol cream (TEMOVATE) 3.81 % Apply 1 application topically as needed.   Marland Kitchen esomeprazole (NEXIUM) 40 MG capsule TAKE 1 CAPSULE TWICE DAILY BEFORE MEALS  . FLUoxetine (PROZAC) 40 MG capsule TAKE (1) CAPSULE DAILY.  . metFORMIN (GLUCOPHAGE) 500 MG tablet TAKE 1 TABLET ONCE DAILY.  . Multiple Vitamins-Minerals (PRESERVISION AREDS PO) Take 1 tablet by mouth in the morning and at bedtime.  . Omega-3 Fatty Acids (FISH OIL) 1000 MG CAPS Take 1 capsule by mouth daily.  . [DISCONTINUED] Multiple Vitamins-Minerals (ICAPS AREDS 2 PO) Take 1 capsule by mouth daily.  . [DISCONTINUED] Probiotic Product (PROBIOTIC DAILY PO) Take 1 capsule by mouth daily.  . [DISCONTINUED] sucralfate (CARAFATE) 1 GM/10ML  suspension Take 10 mLs (1 g total) by mouth 4 (four) times daily.   No facility-administered encounter medications on file as of 06/05/2020.    Review of Systems:  Review of Systems  Review of Systems  Constitutional: Negative for activity change, appetite change, chills, diaphoresis, fatigue and fever.  HENT: Negative for mouth sores, postnasal drip, rhinorrhea, sinus pain and sore throat.   Respiratory: Negative for apnea, cough, chest tightness, shortness of breath and wheezing.   Cardiovascular: Negative for chest pain, palpitations and leg swelling.  Gastrointestinal: Negative for abdominal distention, abdominal pain, constipation, diarrhea, nausea and vomiting.  Genitourinary: Negative for dysuria and frequency.  Musculoskeletal: Negative for arthralgias, joint swelling and myalgias.  Skin: Negative for rash.  Neurological: Negative for dizziness, syncope, weakness, light-headedness and numbness.  Psychiatric/Behavioral: Negative for behavioral problems, confusion and sleep disturbance.     Health Maintenance  Topic Date Due  . INFLUENZA VACCINE  10/20/2019  . MAMMOGRAM  11/20/2020  . HEMOGLOBIN A1C  12/03/2020  . FOOT EXAM  06/05/2021  . TETANUS/TDAP  03/02/2025  . DEXA SCAN  Completed  . COVID-19 Vaccine  Completed  . PNA vac Low Risk Adult  Completed  . HPV VACCINES  Aged Out  . OPHTHALMOLOGY EXAM  Discontinued    Physical Exam: Vitals:   06/05/20 0948  BP: (!) 158/88  Pulse: (!) 49  Temp: (!) 97.4 F (36.3 C)  SpO2: 97%  Weight: 200 lb 12.8 oz (91.1 kg)  Height: 5\' 2"  (1.575 m)   Body mass index is 36.73 kg/m. Physical Exam  Constitutional: Oriented to person, place, and time. Well-developed and well-nourished.  HENT:  Head: Normocephalic.  Mouth/Throat: Oropharynx is clear and moist.  Eyes: Pupils are equal, round, and reactive to light.  Neck: Neck supple.  Cardiovascular: Normal rate and normal heart sounds.  No murmur heard. Pulmonary/Chest:  Effort normal and breath sounds normal. No respiratory distress. No wheezes. She has no rales.  Abdominal: Soft. Bowel sounds are normal. No distension. There is no tenderness. There is no rebound.  Musculoskeletal: No edema.  Lymphadenopathy: none Neurological: Alert and oriented to person, place, and time. Foot exam Good Pulses With no sensory loss. No Lesions or Calluses Skin: Skin is warm and dry.  Psychiatric: Normal mood and affect. Behavior is normal. Thought content normal.    Labs reviewed: Basic Metabolic Panel: Recent Labs    06/27/19 0730 10/30/19 0918 06/02/20 0715  NA 140 137 140  K 4.5 4.3 5.0  CL 103 102 106  CO2 30 27 27   GLUCOSE 119* 108* 106*  BUN 25  27* 28*  CREATININE 1.18* 1.13* 1.15*  CALCIUM 9.5 9.2 9.4  TSH 2.12  --   --    Liver Function Tests: Recent Labs    06/27/19 0730 10/30/19 0918 06/02/20 0715  AST 15 13 14   ALT 13 13 12   BILITOT 0.6 0.7 0.5  PROT 6.4 6.1 6.3   No results for input(s): LIPASE, AMYLASE in the last 8760 hours. No results for input(s): AMMONIA in the last 8760 hours. CBC: Recent Labs    06/27/19 0730 06/02/20 0715  WBC 8.9 8.9  NEUTROABS 5,251 4,592  HGB 13.1 11.6*  HCT 39.7 34.2*  MCV 93.0 92.9  PLT 283 269   Lipid Panel: Recent Labs    06/27/19 0730 06/02/20 0715  CHOL 246* 287*  HDL 48* 47*  LDLCALC 163* 189*  TRIG 191* 300*  CHOLHDL 5.1* 6.1*   Lab Results  Component Value Date   HGBA1C 6.6 (H) 06/02/2020    Procedures since last visit: No results found.  Assessment/Plan 1. Essential hypertension Mildily High With Bradycardia Discussed with her about starting Angiotensin inhibitor She is refusing  - CBC with Differential/Platelet; Future - COMPLETE METABOLIC PANEL WITH GFR; Future  2. Diabetes mellitus with nephropathy (HCC) Good levels of A1C Need to be on ACE inhibitor Does not want it right now CBG at home between 120-150 Fasting  - CBC with Differential/Platelet; Future -  COMPLETE METABOLIC PANEL WITH GFR; Future - Hemoglobin A1c; Future - TSH; Future  3. Idiopathic chronic gout without tophus, unspecified site   4. Chronic GERD On High Doses of nexium  5. Dyslipidemia Allergic to statin and Zetia - Lipid panel; Future  6. Anxiety Xanax PRN  7. DDD (degenerative disc disease), cervical and Lumbar S/p Epidural Injections Doing well now  8. Stage 3a chronic kidney disease (HCC)  - CBC with Differential/Platelet; Future - COMPLETE METABOLIC PANEL WITH GFR; Future  9 Anemia Mild can be due to CKD Will Repeat Next visit If continues will need iron studies  Labs/tests ordered:  * No order type specified * Next appt:  09/03/2020

## 2020-06-05 NOTE — Patient Instructions (Signed)
Check blood pressure 3 times a week.  Call us if it gets over 140/90. Diet and exercise.

## 2020-06-06 ENCOUNTER — Encounter: Payer: Self-pay | Admitting: Internal Medicine

## 2020-06-10 ENCOUNTER — Encounter: Payer: Self-pay | Admitting: *Deleted

## 2020-06-10 DIAGNOSIS — H43813 Vitreous degeneration, bilateral: Secondary | ICD-10-CM | POA: Diagnosis not present

## 2020-06-10 DIAGNOSIS — H353132 Nonexudative age-related macular degeneration, bilateral, intermediate dry stage: Secondary | ICD-10-CM | POA: Diagnosis not present

## 2020-06-10 DIAGNOSIS — E113293 Type 2 diabetes mellitus with mild nonproliferative diabetic retinopathy without macular edema, bilateral: Secondary | ICD-10-CM | POA: Diagnosis not present

## 2020-06-10 DIAGNOSIS — H524 Presbyopia: Secondary | ICD-10-CM | POA: Diagnosis not present

## 2020-06-10 LAB — HM DIABETES EYE EXAM

## 2020-06-15 ENCOUNTER — Telehealth: Payer: Self-pay

## 2020-06-15 NOTE — Telephone Encounter (Signed)
Patient called with concerns about blood pressure. She has been keeping record of blood pressure readings. Patient wants to know if you were considering changing medication.She was seen by Dr. Lyndel Safe 0318/2022. Please advise.  This morning-184/81 23rd-181/86  152/83 162/80 163/90

## 2020-07-09 ENCOUNTER — Other Ambulatory Visit: Payer: Self-pay | Admitting: Internal Medicine

## 2020-07-09 ENCOUNTER — Other Ambulatory Visit: Payer: Self-pay | Admitting: Nurse Practitioner

## 2020-08-05 ENCOUNTER — Other Ambulatory Visit: Payer: Self-pay | Admitting: Nurse Practitioner

## 2020-08-05 DIAGNOSIS — I10 Essential (primary) hypertension: Secondary | ICD-10-CM | POA: Diagnosis not present

## 2020-08-05 DIAGNOSIS — M48062 Spinal stenosis, lumbar region with neurogenic claudication: Secondary | ICD-10-CM | POA: Diagnosis not present

## 2020-08-05 DIAGNOSIS — M48061 Spinal stenosis, lumbar region without neurogenic claudication: Secondary | ICD-10-CM | POA: Diagnosis not present

## 2020-08-05 DIAGNOSIS — Z6837 Body mass index (BMI) 37.0-37.9, adult: Secondary | ICD-10-CM | POA: Diagnosis not present

## 2020-08-27 ENCOUNTER — Other Ambulatory Visit: Payer: Self-pay

## 2020-08-27 ENCOUNTER — Encounter: Payer: Self-pay | Admitting: Nurse Practitioner

## 2020-08-27 ENCOUNTER — Non-Acute Institutional Stay: Payer: Medicare PPO | Admitting: Nurse Practitioner

## 2020-08-27 DIAGNOSIS — D631 Anemia in chronic kidney disease: Secondary | ICD-10-CM

## 2020-08-27 DIAGNOSIS — E669 Obesity, unspecified: Secondary | ICD-10-CM

## 2020-08-27 DIAGNOSIS — N189 Chronic kidney disease, unspecified: Secondary | ICD-10-CM | POA: Insufficient documentation

## 2020-08-27 DIAGNOSIS — E785 Hyperlipidemia, unspecified: Secondary | ICD-10-CM | POA: Diagnosis not present

## 2020-08-27 DIAGNOSIS — M5030 Other cervical disc degeneration, unspecified cervical region: Secondary | ICD-10-CM | POA: Diagnosis not present

## 2020-08-27 DIAGNOSIS — F419 Anxiety disorder, unspecified: Secondary | ICD-10-CM | POA: Diagnosis not present

## 2020-08-27 DIAGNOSIS — N1831 Chronic kidney disease, stage 3a: Secondary | ICD-10-CM | POA: Diagnosis not present

## 2020-08-27 DIAGNOSIS — K219 Gastro-esophageal reflux disease without esophagitis: Secondary | ICD-10-CM | POA: Diagnosis not present

## 2020-08-27 DIAGNOSIS — N183 Chronic kidney disease, stage 3 unspecified: Secondary | ICD-10-CM | POA: Diagnosis not present

## 2020-08-27 DIAGNOSIS — M1A00X0 Idiopathic chronic gout, unspecified site, without tophus (tophi): Secondary | ICD-10-CM | POA: Diagnosis not present

## 2020-08-27 DIAGNOSIS — E1169 Type 2 diabetes mellitus with other specified complication: Secondary | ICD-10-CM

## 2020-08-27 DIAGNOSIS — I10 Essential (primary) hypertension: Secondary | ICD-10-CM

## 2020-08-27 NOTE — Assessment & Plan Note (Signed)
No acute gout flare ups, uric acid 4.9 01/01/19, on Allopurinol 100mg  qd.

## 2020-08-27 NOTE — Assessment & Plan Note (Signed)
cervical and lumbar spine: s/p injs, pain is controlled.

## 2020-08-27 NOTE — Progress Notes (Signed)
Location:   clinic [place]   Place of Service:  Clinic (12) Provider: Marlana Latus NP  Code Status: DNR Goals of Care: [place] Advanced Directives 08/27/2020  Does Patient Have a Medical Advance Directive? Yes  Type of Paramedic of [place];Living will;Out of facility DNR (pink MOST or yellow form)  Does patient want to make changes to medical advance directive? No - Patient declined  Copy of [place] in Chart? Yes - validated most recent copy scanned in chart (See row information)  Pre-existing out of facility DNR order (yellow form or pink MOST form) Yellow form placed in chart (order not valid for inpatient use)     Chief Complaint  Patient presents with   Medical Management of Chronic Issues    3 month follow up. Patient complains of having low energy.      HPI: Patient is a 80 y.o. female seen today for medical management of chronic diseases.     T2DM, Hgb a1c 6.6 06/02/20, takes Metformin 533m qd.             No acute gout flare ups, uric acid 4.9 01/01/19, on Allopurinol 1079mqd.                 Her mood is stable, on Alprazolam 0.2528mid prn, Prozac 71m22m.             GERD, stable on Nexium 71mg10m Carafate qid, Hgb 11.6 06/02/20             HTN, blood pressure is better controlled in the past month, 167-132/78-68, HR 60s/occasionally 50s, feels better since take Beets capsule,  on Ziac 10/6.25mg qd.              LDL 189 06/02/20, takes Omega 3  DDD cervical and lumbar spine: s/p injs, pain is controlled.   CKD Bun/creat 28/1.15 eGFR 45 06/02/20  Anemia, Hgb 11.6 06/02/20    Past Medical History:  Diagnosis Date   Allergy    Anemia    Anxiety    Arthritis    with gout   Carotid bruit    right   Cataract    bil cataracts removed   Chest pain    Chronic kidney disease    Colon polyps    Depression    Diabetes type 2, uncontrolled (HCC) [place]    Duodenitis    Exogenous obesity    Gastric ulcer    GERD  (gastroesophageal reflux disease)    Hyperlipidemia    Hypertension    Metabolic syndrome    Osteopenia    Seasonal allergies    seasonal   Tubular adenoma of colon     Past Surgical History:  Procedure Laterality Date   COLONOSCOPY     11/90,11/91,1994,2000,2010   DILATION AND CURETTAGE OF UTERUS     DILATION AND CURETTAGE OF UTERUS  1997   FOOT SURGERY     x 2   HAND SURGERY Right    Carpel Tunnel   POLYPECTOMY     TUBAL LIGATION     uterine polyps removed     x2    Allergies  Allergen Reactions   Crestor [Rosuvastatin Calcium]     Myalgias    Elemental Sulfur     Made infection worse   Gemfibrozil Itching   Iodine     Skin rash   Lipitor [Atorvastatin Calcium]     Myalgias    Lovastatin  myalgias   Sulfonamide Derivatives     REACTION: n\T\v   Zetia [Ezetimibe]     Cough, dry    Allergies as of 08/27/2020       Reactions   Crestor [rosuvastatin Calcium]    Myalgias   Elemental Sulfur    Made infection worse   Gemfibrozil Itching   Iodine    Skin rash   Lipitor [atorvastatin Calcium]    Myalgias   Lovastatin    myalgias   Sulfonamide Derivatives    REACTION: n\T\v   Zetia [ezetimibe]    Cough, dry        Medication List        Accurate as of August 27, 2020 11:59 PM. If you have any questions, ask your nurse or doctor.          Accu-Chek FastClix Lancets Misc USE AS DIRECTED   Accu-Chek SmartView test strip Generic drug: glucose blood CHECK BLOOD SUGAR 3 TIMES DAILY AS DIRECTED.   acetaminophen 500 MG tablet Commonly known as: TYLENOL Take 500 mg by mouth every 6 (six) hours as needed.   allopurinol 100 MG tablet Commonly known as: ZYLOPRIM TAKE 2 TABLETS BY MOUTH ONCE DAILY.   ALPRAZolam 0.25 MG tablet Commonly known as: XANAX TAKE (1) TABLET TWICE A DAY AS NEEDED.   bisoprolol-hydrochlorothiazide 10-6.25 MG tablet Commonly known as: ZIAC TAKE 1 TABLET ONCE DAILY.   clobetasol cream 0.05 % Commonly known as:  TEMOVATE Apply 1 application topically as needed.   esomeprazole 40 MG capsule Commonly known as: NEXIUM TAKE 1 CAPSULE TWICE DAILY BEFORE MEALS   Fish Oil 1000 MG Caps Take 1 capsule by mouth daily.   FLUoxetine 40 MG capsule Commonly known as: PROZAC TAKE (1) CAPSULE DAILY.   metFORMIN 500 MG tablet Commonly known as: GLUCOPHAGE TAKE 1 TABLET ONCE DAILY.   PRESERVISION AREDS PO Take 1 tablet by mouth in the morning and at bedtime.   Vitamin D3 50 MCG (2000 UT) capsule Take 2,000 Units by mouth daily.        Review of Systems:  Review of Systems  Constitutional:  Negative for fatigue, fever and unexpected weight change.  HENT:  Positive for hearing loss. Negative for congestion and voice change.   Respiratory:  Negative for cough and shortness of breath.   Cardiovascular:  Negative for leg swelling.  Gastrointestinal:  Negative for abdominal pain and constipation.  Genitourinary:  Negative for difficulty urinating, dysuria and urgency.       1-2x/night  Musculoskeletal:  Positive for arthralgias, back pain and gait problem. Negative for joint swelling.  Skin:  Negative for color change.  Neurological:  Negative for speech difficulty, weakness and headaches.  Psychiatric/Behavioral:  Negative for behavioral problems and sleep disturbance. The patient is not nervous/anxious.    Health Maintenance  Topic Date Due   COVID-19 Vaccine (4 - Booster for Moderna series) 05/27/2020   INFLUENZA VACCINE  10/19/2020   MAMMOGRAM  11/20/2020   HEMOGLOBIN A1C  12/03/2020   FOOT EXAM  06/05/2021   TETANUS/TDAP  03/02/2025   DEXA SCAN  Completed   PNA vac Low Risk Adult  Completed   Zoster Vaccines- Shingrix  Completed   HPV VACCINES  Aged Out   OPHTHALMOLOGY EXAM  Discontinued    Physical Exam: Vitals:   08/27/20 1402  BP: 136/76  Pulse: 71  Resp: 20  Temp: (!) 97.1 F (36.2 C)  SpO2: 97%  Weight: 196 lb (88.9 kg)  Height: _0  (1.575 m)  Body mass index is  35.85 kg/m. Physical Exam Constitutional:      Appearance: Normal appearance.  HENT:     Head: Normocephalic and atraumatic.     Nose: Nose normal.     Mouth/Throat:     Mouth: Mucous membranes are moist.  Eyes:     Extraocular Movements: Extraocular movements intact.     Conjunctiva/sclera: Conjunctivae normal.     Pupils: Pupils are equal, round, and reactive to light.  Cardiovascular:     Rate and Rhythm: Normal rate and regular rhythm.     Heart sounds: No murmur heard.    Comments: Weak DP pulses, will see Podiatry.  Pulmonary:     Breath sounds: No rales.  Abdominal:     General: Bowel sounds are normal.     Palpations: Abdomen is soft.     Tenderness: There is no abdominal tenderness.  Musculoskeletal:     Cervical back: Normal range of motion and neck supple.     Right lower leg: No edema.     Left lower leg: No edema.     Comments: Neck pain, lower back pain radiating to the left buttock/thigh/knee. Left 2nd hammer toe.   Skin:    General: Skin is warm and dry.  Neurological:     General: No focal deficit present.     Mental Status: She is alert and oriented to person, place, and time. Mental status is at baseline.  Psychiatric:        Mood and Affect: Mood normal.        Behavior: Behavior normal.        Thought Content: Thought content normal.        Judgment: Judgment normal.    Labs reviewed: Basic Metabolic Panel: Recent Labs    10/30/19 0918 06/02/20 0715  NA 137 140  K 4.3 5.0  CL 102 106  CO2 27 27  GLUCOSE 108* 106*  BUN 27* 28*  CREATININE 1.13* 1.15*  CALCIUM 9.2 9.4   Liver Function Tests: Recent Labs    10/30/19 0918 06/02/20 0715  AST 13 14  ALT 13 12  BILITOT 0.7 0.5  PROT 6.1 6.3   No results for input(s): LIPASE, AMYLASE in the last 8760 hours. No results for input(s): AMMONIA in the last 8760 hours. CBC: Recent Labs    06/02/20 0715  WBC 8.9  NEUTROABS 4,592  HGB 11.6*  HCT 34.2*  MCV 92.9  PLT 269   Lipid  Panel: Recent Labs    06/02/20 0715  CHOL 287*  HDL 47*  LDLCALC 189*  TRIG 300*  CHOLHDL 6.1*   Lab Results  Component Value Date   HGBA1C 6.6 (H) 06/02/2020    Procedures since last visit: No results found.  Assessment/Plan  Dyslipidemia 189 06/02/20, takes Omega 3, Statin/Zetia listed as allergy.   Anemia due to chronic kidney disease Hgb 11.6 06/02/20  CKD (chronic kidney disease) stage 3, GFR 30-59 ml/min (HCC) Bun/creat 28/1.15 eGFR 45 06/02/20  DDD (degenerative disc disease), cervical cervical and lumbar spine: s/p injs, pain is controlled.   Essential hypertension blood pressure is better controlled in the past month, 167-132/78-68, HR 60s/occasionally 50s, feels better since take Beets capsule,  on Ziac 10/6.25mg qd.   Chronic GERD Stable, occasional heart burns, indigestion, on Nexium 47m qd, Carafate qid, Hgb 11.6 06/02/20  Anxiety Her mood is stable, on Alprazolam 0.231mbid prn, Prozac 4022md.  Gout No acute gout flare ups, uric acid 4.9 01/01/19, on  Allopurinol 11m qd.      Diabetes mellitus type 2 in obese (HCC) Hgb a1c 6.6 06/02/20, takes Metformin 5024mqd.   Labs/tests ordered:  labs prior to the next appointment.   Next appt:  3-4 months.

## 2020-08-27 NOTE — Assessment & Plan Note (Signed)
Bun/creat 28/1.15 eGFR 45 06/02/20

## 2020-08-27 NOTE — Assessment & Plan Note (Signed)
Hgb a1c 6.6 06/02/20, takes Metformin 500mg  qd.

## 2020-08-27 NOTE — Assessment & Plan Note (Addendum)
Stable, occasional heart burns, indigestion, on Nexium 40mg  qd, Carafate qid, Hgb 11.6 06/02/20

## 2020-08-27 NOTE — Assessment & Plan Note (Signed)
Hgb 11.6 06/02/20

## 2020-08-27 NOTE — Assessment & Plan Note (Signed)
Her mood is stable, on Alprazolam 0.25mg  bid prn, Prozac 40mg  qd.

## 2020-08-27 NOTE — Assessment & Plan Note (Signed)
189 06/02/20, takes Omega 3, Statin/Zetia listed as allergy.

## 2020-08-27 NOTE — Assessment & Plan Note (Addendum)
blood pressure is better controlled in the past month, 167-132/78-68, HR 60s/occasionally 50s, feels better since take Beets capsule,  on Ziac 10/6.25mg  qd.

## 2020-08-28 ENCOUNTER — Encounter: Payer: Self-pay | Admitting: Nurse Practitioner

## 2020-09-03 ENCOUNTER — Encounter: Payer: Self-pay | Admitting: Nurse Practitioner

## 2020-10-07 ENCOUNTER — Inpatient Hospital Stay: Admission: RE | Admit: 2020-10-07 | Payer: Medicare PPO | Source: Ambulatory Visit

## 2020-10-12 ENCOUNTER — Other Ambulatory Visit: Payer: Self-pay | Admitting: Nurse Practitioner

## 2020-11-03 ENCOUNTER — Other Ambulatory Visit: Payer: Self-pay | Admitting: Nurse Practitioner

## 2020-11-24 ENCOUNTER — Other Ambulatory Visit: Payer: Medicare PPO

## 2020-11-24 ENCOUNTER — Other Ambulatory Visit: Payer: Self-pay

## 2020-11-24 DIAGNOSIS — I10 Essential (primary) hypertension: Secondary | ICD-10-CM

## 2020-11-24 DIAGNOSIS — E1121 Type 2 diabetes mellitus with diabetic nephropathy: Secondary | ICD-10-CM | POA: Diagnosis not present

## 2020-11-24 DIAGNOSIS — E785 Hyperlipidemia, unspecified: Secondary | ICD-10-CM

## 2020-11-24 DIAGNOSIS — N1831 Chronic kidney disease, stage 3a: Secondary | ICD-10-CM

## 2020-11-25 LAB — LIPID PANEL
Cholesterol: 321 mg/dL — ABNORMAL HIGH (ref <200)
HDL: 43 mg/dL — ABNORMAL LOW (ref >=50)
LDL Cholesterol (Calc): 215 mg/dL (calc) — ABNORMAL HIGH
Non-HDL Cholesterol (Calc): 278 mg/dL (calc) — ABNORMAL HIGH (ref <130)
Total CHOL/HDL Ratio: 7.5 (calc) — ABNORMAL HIGH (ref <5.0)
Triglycerides: 373 mg/dL — ABNORMAL HIGH (ref <150)

## 2020-11-25 LAB — COMPLETE METABOLIC PANEL WITH GFR
AG Ratio: 1.7 (calc) (ref 1.0–2.5)
ALT: 13 U/L (ref 6–29)
AST: 15 U/L (ref 10–35)
Albumin: 4.1 g/dL (ref 3.6–5.1)
Alkaline phosphatase (APISO): 102 U/L (ref 37–153)
BUN/Creatinine Ratio: 24 (calc) — ABNORMAL HIGH (ref 6–22)
BUN: 26 mg/dL — ABNORMAL HIGH (ref 7–25)
CO2: 26 mmol/L (ref 20–32)
Calcium: 9.1 mg/dL (ref 8.6–10.4)
Chloride: 100 mmol/L (ref 98–110)
Creat: 1.07 mg/dL — ABNORMAL HIGH (ref 0.60–0.95)
Globulin: 2.4 g/dL (calc) (ref 1.9–3.7)
Glucose, Bld: 102 mg/dL — ABNORMAL HIGH (ref 65–99)
Potassium: 4.3 mmol/L (ref 3.5–5.3)
Sodium: 138 mmol/L (ref 135–146)
Total Bilirubin: 0.5 mg/dL (ref 0.2–1.2)
Total Protein: 6.5 g/dL (ref 6.1–8.1)
eGFR: 52 mL/min/{1.73_m2} — ABNORMAL LOW (ref >=60)

## 2020-11-25 LAB — CBC WITH DIFFERENTIAL/PLATELET
Absolute Monocytes: 618 cells/uL (ref 200–950)
Basophils Absolute: 70 cells/uL (ref 0–200)
Basophils Relative: 0.8 %
Eosinophils Absolute: 296 cells/uL (ref 15–500)
Eosinophils Relative: 3.4 %
HCT: 36.3 % (ref 35.0–45.0)
Hemoglobin: 11.8 g/dL (ref 11.7–15.5)
Lymphs Abs: 2758 cells/uL (ref 850–3900)
MCH: 30.3 pg (ref 27.0–33.0)
MCHC: 32.5 g/dL (ref 32.0–36.0)
MCV: 93.3 fL (ref 80.0–100.0)
MPV: 10.6 fL (ref 7.5–12.5)
Monocytes Relative: 7.1 %
Neutro Abs: 4959 cells/uL (ref 1500–7800)
Neutrophils Relative %: 57 %
Platelets: 268 10*3/uL (ref 140–400)
RBC: 3.89 10*6/uL (ref 3.80–5.10)
RDW: 14 % (ref 11.0–15.0)
Total Lymphocyte: 31.7 %
WBC: 8.7 10*3/uL (ref 3.8–10.8)

## 2020-11-25 LAB — TSH: TSH: 2.93 mIU/L (ref 0.40–4.50)

## 2020-11-26 ENCOUNTER — Non-Acute Institutional Stay: Payer: Medicare PPO | Admitting: Nurse Practitioner

## 2020-11-26 ENCOUNTER — Encounter: Payer: Self-pay | Admitting: Nurse Practitioner

## 2020-11-26 ENCOUNTER — Other Ambulatory Visit: Payer: Self-pay

## 2020-11-26 DIAGNOSIS — N1831 Chronic kidney disease, stage 3a: Secondary | ICD-10-CM

## 2020-11-26 DIAGNOSIS — M5030 Other cervical disc degeneration, unspecified cervical region: Secondary | ICD-10-CM

## 2020-11-26 DIAGNOSIS — M1A00X0 Idiopathic chronic gout, unspecified site, without tophus (tophi): Secondary | ICD-10-CM

## 2020-11-26 DIAGNOSIS — E1169 Type 2 diabetes mellitus with other specified complication: Secondary | ICD-10-CM

## 2020-11-26 DIAGNOSIS — K219 Gastro-esophageal reflux disease without esophagitis: Secondary | ICD-10-CM

## 2020-11-26 DIAGNOSIS — F419 Anxiety disorder, unspecified: Secondary | ICD-10-CM

## 2020-11-26 DIAGNOSIS — I10 Essential (primary) hypertension: Secondary | ICD-10-CM

## 2020-11-26 DIAGNOSIS — N183 Chronic kidney disease, stage 3 unspecified: Secondary | ICD-10-CM

## 2020-11-26 DIAGNOSIS — D631 Anemia in chronic kidney disease: Secondary | ICD-10-CM | POA: Diagnosis not present

## 2020-11-26 DIAGNOSIS — E669 Obesity, unspecified: Secondary | ICD-10-CM

## 2020-11-26 NOTE — Progress Notes (Signed)
Location:   clinic [place]   Place of Service:  Clinic (12) Provider: Marlana Latus NP  Code Status: DNR Goals of Care: [place] 11/26/2020  Does Patient Have a Medical Advance Directive? No  Type of Advance Directive -  Does patient want to make changes to medical advance directive? -  Copy of [place] in Chart? -  Would patient like information on creating a medical advance directive? No - Patient declined  Pre-existing out of facility DNR order (yellow form or pink MOST form) -     Chief Complaint  Patient presents with   Medical Management of Chronic Issues    3 month follow up. Discuss labs   Health Maintenance    Discuss need for influenza vaccine and mammogram    HPI: Patient is a 80 y.o. female seen today for medical management of chronic diseases.   T2DM, takes Metformin 534m qd. TSH 2.93 11/24/20             No acute gout flare ups, uric acid 4.9 01/01/19, on Allopurinol 1076mqd.                 Her mood is stable, on Alprazolam 0.253mid prn, Prozac 68m63m.             GERD, stable on Nexium 68mg41m Hgb 11.8 11/24/20             HTN, takes Beets capsule,  Ziac 10/6.25mg qd.              LDL 215 11/24/20, takes Omega 3             DDD cervical and lumbar spine: s/p injs, pain is controlled.              CKD Bun/creat 26/1.07 eGFR 52 11/24/20             Anemia, Hgb 11.8 11/24/20                  Past Medical History:  Diagnosis Date   Allergy    Anemia    Anxiety    Arthritis    with gout   Carotid bruit    right   Cataract    bil cataracts removed   Chest pain    Chronic kidney disease    Colon polyps    Depression    Diabetes type 2, uncontrolled (HCC) [place]    Duodenitis    Exogenous obesity    Gastric ulcer    GERD (gastroesophageal reflux disease)    Hyperlipidemia    Hypertension    Metabolic syndrome    Osteopenia    Seasonal allergies    seasonal   Tubular adenoma of colon     Past Surgical  History:  Procedure Laterality Date   COLONOSCOPY     11/90,11/91,1994,2000,2010   DILATION AND CURETTAGE OF UTERUS     DILATION AND CURETTAGE OF UTERUS  1997   FOOT SURGERY     x 2   HAND SURGERY Right    Carpel Tunnel   POLYPECTOMY     TUBAL LIGATION     uterine polyps removed     x2    Allergies  Allergen Reactions   Crestor [Rosuvastatin Calcium]     Myalgias    Elemental Sulfur     Made infection worse   Gemfibrozil Itching   Iodine     Skin rash  Lipitor [Atorvastatin Calcium]     Myalgias    Lovastatin     myalgias   Sulfonamide Derivatives     REACTION: n\T\v   Zetia [Ezetimibe]     Cough, dry    Allergies as of 11/26/2020       Reactions   Crestor [rosuvastatin Calcium]    Myalgias   Elemental Sulfur    Made infection worse   Gemfibrozil Itching   Iodine    Skin rash   Lipitor [atorvastatin Calcium]    Myalgias   Lovastatin    myalgias   Sulfonamide Derivatives    REACTION: n\T\v   Zetia [ezetimibe]    Cough, dry        Medication List        Accurate as of November 26, 2020 11:59 PM. If you have any questions, ask your nurse or doctor.          Accu-Chek FastClix Lancets Misc USE AS DIRECTED   Accu-Chek SmartView test strip Generic drug: glucose blood CHECK BLOOD SUGAR 3 TIMES DAILY AS DIRECTED.   acetaminophen 500 MG tablet Commonly known as: TYLENOL Take 500 mg by mouth every 6 (six) hours as needed.   allopurinol 100 MG tablet Commonly known as: ZYLOPRIM TAKE 2 TABLETS BY MOUTH ONCE DAILY.   ALPRAZolam 0.25 MG tablet Commonly known as: XANAX TAKE (1) TABLET TWICE A DAY AS NEEDED.   bisoprolol-hydrochlorothiazide 10-6.25 MG tablet Commonly known as: ZIAC TAKE 1 TABLET ONCE DAILY.   clobetasol cream 0.05 % Commonly known as: TEMOVATE Apply 1 application topically as needed.   esomeprazole 40 MG capsule Commonly known as: NEXIUM TAKE 1 CAPSULE TWICE DAILY BEFORE MEALS   Fish Oil 1000 MG Caps Take 1 capsule  by mouth daily.   FLUoxetine 40 MG capsule Commonly known as: PROZAC TAKE (1) CAPSULE DAILY.   metFORMIN 500 MG tablet Commonly known as: GLUCOPHAGE TAKE 1 TABLET ONCE DAILY.   PRESERVISION AREDS PO Take 1 tablet by mouth in the morning and at bedtime.   Vitamin D3 50 MCG (2000 UT) capsule Take 2,000 Units by mouth daily.        Review of Systems:  Review of Systems  Constitutional:  Negative for fatigue, fever and unexpected weight change.  HENT:  Positive for hearing loss. Negative for congestion and voice change.   Respiratory:  Negative for cough and shortness of breath.   Cardiovascular:  Negative for leg swelling.  Gastrointestinal:  Negative for abdominal pain and constipation.  Genitourinary:  Negative for difficulty urinating, dysuria and urgency.       1-2x/night  Musculoskeletal:  Positive for arthralgias, back pain and gait problem.  Skin:  Negative for color change.  Neurological:  Negative for speech difficulty, weakness and light-headedness.  Psychiatric/Behavioral:  Negative for behavioral problems and sleep disturbance. The patient is not nervous/anxious.    Health Maintenance  Topic Date Due   COVID-19 Vaccine (4 - Booster for Moderna series) 05/27/2020   INFLUENZA VACCINE  10/19/2020   MAMMOGRAM  11/20/2020   HEMOGLOBIN A1C  12/03/2020   FOOT EXAM  06/05/2021   TETANUS/TDAP  03/02/2025   DEXA SCAN  Completed   PNA vac Low Risk Adult  Completed   Zoster Vaccines- Shingrix  Completed   HPV VACCINES  Aged Out   OPHTHALMOLOGY EXAM  Discontinued    Physical Exam: Vitals:   11/26/20 1531  BP: (!) 146/82  Pulse: 84  Resp: 18  Temp: (!) 96.9 F (36.1 C)  SpO2:  99%  Weight: 202 lb 6.4 oz (91.8 kg)  Height: '5\' 2"'  (1.575 m)   Body mass index is 37.02 kg/m. Physical Exam Constitutional:      Appearance: Normal appearance.  HENT:     Head: Normocephalic and atraumatic.     Nose: Nose normal.     Mouth/Throat:     Mouth: Mucous membranes are  moist.  Eyes:     Extraocular Movements: Extraocular movements intact.     Conjunctiva/sclera: Conjunctivae normal.     Pupils: Pupils are equal, round, and reactive to light.  Cardiovascular:     Rate and Rhythm: Normal rate and regular rhythm.     Heart sounds: No murmur heard.    Comments: Weak DP pulses Pulmonary:     Breath sounds: No rales.  Abdominal:     General: Bowel sounds are normal.     Palpations: Abdomen is soft.     Tenderness: There is no abdominal tenderness.  Musculoskeletal:     Cervical back: Normal range of motion and neck supple.     Right lower leg: No edema.     Left lower leg: No edema.     Comments: Neck pain, lower back pain radiating to the left buttock/thigh/knee. Left 2nd hammer toe.   Skin:    General: Skin is warm and dry.  Neurological:     General: No focal deficit present.     Mental Status: She is alert and oriented to person, place, and time. Mental status is at baseline.  Psychiatric:        Mood and Affect: Mood normal.        Behavior: Behavior normal.        Thought Content: Thought content normal.        Judgment: Judgment normal.    Labs reviewed: Basic Metabolic Panel: Recent Labs    06/02/20 0715 11/24/20 0705  NA 140 138  K 5.0 4.3  CL 106 100  CO2 27 26  GLUCOSE 106* 102*  BUN 28* 26*  CREATININE 1.15* 1.07*  CALCIUM 9.4 9.1  TSH  --  2.93   Liver Function Tests: Recent Labs    06/02/20 0715 11/24/20 0705  AST 14 15  ALT 12 13  BILITOT 0.5 0.5  PROT 6.3 6.5   No results for input(s): LIPASE, AMYLASE in the last 8760 hours. No results for input(s): AMMONIA in the last 8760 hours. CBC: Recent Labs    06/02/20 0715 11/24/20 0705  WBC 8.9 8.7  NEUTROABS 4,592 4,959  HGB 11.6* 11.8  HCT 34.2* 36.3  MCV 92.9 93.3  PLT 269 268   Lipid Panel: Recent Labs    06/02/20 0715 11/24/20 0705  CHOL 287* 321*  HDL 47* 43*  LDLCALC 189* 215*  TRIG 300* 373*  CHOLHDL 6.1* 7.5*   Lab Results  Component  Value Date   HGBA1C 6.6 (H) 06/02/2020    Procedures since last visit: No results found.  Assessment/Plan  Chronic GERD stable on Nexium 75m qd, Hgb 11.8 11/24/20  Essential hypertension Blood pressure is controlled, takes Beets capsule,  Ziac 10/6.25mg qd.   DDD (degenerative disc disease), cervical cervical and lumbar spine: s/p injs, pain is controlled.   CKD (chronic kidney disease) stage 3, GFR 30-59 ml/min (HCC) Bun/creat 26/1.07 eGFR 52 11/24/20  Anemia due to chronic kidney disease Hgb 11.8 11/24/20  Anxiety on Alprazolam 0.284mbid prn, Prozac 4079md.  Gout uric acid 4.9 01/01/19, on Allopurinol 100m55m.  Diabetes mellitus type 2 in obese [facility]) takes Metformin 552m qd. TSH 2.93 11/24/20   Labs/tests ordered:  none  Next appt:  6 months

## 2020-11-26 NOTE — Assessment & Plan Note (Signed)
Hgb 11.8 11/24/20

## 2020-11-26 NOTE — Assessment & Plan Note (Signed)
Bun/creat 26/1.07 eGFR 52 11/24/20

## 2020-11-26 NOTE — Assessment & Plan Note (Signed)
stable on Nexium 40mg  qd, Hgb 11.8 11/24/20

## 2020-11-26 NOTE — Assessment & Plan Note (Signed)
cervical and lumbar spine: s/p injs, pain is controlled.

## 2020-11-26 NOTE — Assessment & Plan Note (Signed)
uric acid 4.9 01/01/19, on Allopurinol '100mg'$  qd.

## 2020-11-26 NOTE — Assessment & Plan Note (Signed)
takes Metformin '500mg'$  qd. TSH 2.93 11/24/20

## 2020-11-26 NOTE — Assessment & Plan Note (Signed)
Blood pressure is controlled,  takes Beets capsule,  Ziac 10/6.25mg qd.    

## 2020-11-26 NOTE — Assessment & Plan Note (Signed)
on Alprazolam 0.'25mg'$  bid prn, Prozac '40mg'$  qd.

## 2020-11-27 ENCOUNTER — Encounter: Payer: Self-pay | Admitting: Nurse Practitioner

## 2021-01-05 ENCOUNTER — Other Ambulatory Visit: Payer: Self-pay | Admitting: Nurse Practitioner

## 2021-02-01 ENCOUNTER — Other Ambulatory Visit: Payer: Self-pay | Admitting: Nurse Practitioner

## 2021-02-04 ENCOUNTER — Encounter: Payer: Self-pay | Admitting: Nurse Practitioner

## 2021-02-04 ENCOUNTER — Other Ambulatory Visit: Payer: Self-pay

## 2021-02-04 ENCOUNTER — Non-Acute Institutional Stay: Payer: Medicare PPO | Admitting: Nurse Practitioner

## 2021-02-04 VITALS — BP 142/72 | HR 61 | Temp 96.5°F | Resp 18 | Wt 199.6 lb

## 2021-02-04 DIAGNOSIS — E1169 Type 2 diabetes mellitus with other specified complication: Secondary | ICD-10-CM

## 2021-02-04 DIAGNOSIS — D631 Anemia in chronic kidney disease: Secondary | ICD-10-CM

## 2021-02-04 DIAGNOSIS — N183 Chronic kidney disease, stage 3 unspecified: Secondary | ICD-10-CM

## 2021-02-04 DIAGNOSIS — E785 Hyperlipidemia, unspecified: Secondary | ICD-10-CM | POA: Diagnosis not present

## 2021-02-04 DIAGNOSIS — R300 Dysuria: Secondary | ICD-10-CM | POA: Diagnosis not present

## 2021-02-04 DIAGNOSIS — N390 Urinary tract infection, site not specified: Secondary | ICD-10-CM

## 2021-02-04 DIAGNOSIS — K219 Gastro-esophageal reflux disease without esophagitis: Secondary | ICD-10-CM

## 2021-02-04 DIAGNOSIS — F419 Anxiety disorder, unspecified: Secondary | ICD-10-CM

## 2021-02-04 DIAGNOSIS — M1A00X0 Idiopathic chronic gout, unspecified site, without tophus (tophi): Secondary | ICD-10-CM

## 2021-02-04 DIAGNOSIS — I10 Essential (primary) hypertension: Secondary | ICD-10-CM

## 2021-02-04 DIAGNOSIS — E669 Obesity, unspecified: Secondary | ICD-10-CM

## 2021-02-04 DIAGNOSIS — M5030 Other cervical disc degeneration, unspecified cervical region: Secondary | ICD-10-CM | POA: Diagnosis not present

## 2021-02-04 DIAGNOSIS — R319 Hematuria, unspecified: Secondary | ICD-10-CM

## 2021-02-04 DIAGNOSIS — N1831 Chronic kidney disease, stage 3a: Secondary | ICD-10-CM

## 2021-02-04 MED ORDER — PHENAZOPYRIDINE HCL 100 MG PO TABS
100.0000 mg | ORAL_TABLET | Freq: Two times a day (BID) | ORAL | 1 refills | Status: DC
Start: 2021-02-04 — End: 2021-04-29

## 2021-02-04 NOTE — Assessment & Plan Note (Signed)
takes Metformin 500mg  qd. TSH 2.93 11/24/20, Hgb a1c 6.6 06/02/20

## 2021-02-04 NOTE — Assessment & Plan Note (Signed)
Blood pressure is controlled,  takes Beets capsule,  Ziac 10/6.25mg qd.    

## 2021-02-04 NOTE — Assessment & Plan Note (Signed)
cervical and lumbar spine: s/p injs, pain is controlled.

## 2021-02-04 NOTE — Assessment & Plan Note (Signed)
Her mood is stable, on Alprazolam 0.25mg  bid prn, Prozac 40mg  qd.

## 2021-02-04 NOTE — Assessment & Plan Note (Signed)
stable on Nexium 40mg  qd, Hgb 11.8 11/24/20

## 2021-02-04 NOTE — Assessment & Plan Note (Addendum)
c/o discomfort upon urination, urinary frequency, urinary urgency for a couple of days, denied nausea, vomiting, abd pain, or fever. Will obtain UA C/S in am. Recommend the patient to adequate oral hydration, call if new symptoms develop. Will try Pyridium 100mg  tid x 2 days.

## 2021-02-04 NOTE — Progress Notes (Signed)
Location:  clinic [place]    Place of Service:  Clinic (12) Provider: Marlana Latus NP  Code Status: DNR Goals of Care[place] 11/26/2020  Does Patient Have a Medical Advance Directive? No  Type of Advance Directive -  Does patient want to make changes to medical advance directive? -  Copy of [place] in Chart? -  Would patient like information on creating a medical advance directive? No - Patient declined  Pre-existing out of facility DNR order (yellow form or pink MOST form) -     Chief Complaint  Patient presents with   Acute Visit    Patient returned to the clinic for bladder issues.      HPI: Patient is a 80 y.o. female seen today for c/o discomfort upon urination, urinary frequency, urinary urgency for a couple of days, denied nausea, vomiting, abd pain, or fever T2DM, takes Metformin 55m qd. TSH 2.93 11/24/20, Hgb a1c 6.6 06/02/20             No acute gout flare ups, uric acid 4.9 01/01/19, on Allopurinol 109mqd.                 Her mood is stable, on Alprazolam 0.2520mid prn, Prozac 9m60m.             GERD, stable on Nexium 9mg62m Hgb 11.8 11/24/20             HTN, takes Beets capsule,  Ziac 10/6.25mg qd.              LDL 215 11/24/20, takes Omega 3, allergic to statin.              DDD cervical and lumbar spine: s/p injs, pain is controlled.              CKD Bun/creat 26/1.07 eGFR 52 11/24/20             Anemia, Hgb 11.8 11/24/20                   Past Medical History:  Diagnosis Date   Allergy    Anemia    Anxiety    Arthritis    with gout   Carotid bruit    right   Cataract    bil cataracts removed   Chest pain    Chronic kidney disease    Colon polyps    Depression    Diabetes type 2, uncontrolled    Diverticulosis    Duodenitis    Exogenous obesity    Gastric ulcer    GERD (gastroesophageal reflux disease)    Hyperlipidemia    Hypertension    Metabolic syndrome    Osteopenia    Seasonal allergies    seasonal    Tubular adenoma of colon     Past Surgical History:  Procedure Laterality Date   COLONOSCOPY     11/90,11/91,1994,2000,2010   DILATION AND CURETTAGE OF UTERUS     DILATION AND CURETTAGE OF UTERUS  1997   FOOT SURGERY     x 2   HAND SURGERY Right    Carpel Tunnel   POLYPECTOMY     TUBAL LIGATION     uterine polyps removed     x2    Allergies  Allergen Reactions   Crestor [Rosuvastatin Calcium]     Myalgias    Elemental Sulfur     Made infection worse   Gemfibrozil Itching   Iodine  Skin rash   Lipitor [Atorvastatin Calcium]     Myalgias    Lovastatin     myalgias   Sulfonamide Derivatives     REACTION: n\T\v   Zetia [Ezetimibe]     Cough, dry    Allergies as of 02/04/2021       Reactions   Crestor [rosuvastatin Calcium]    Myalgias   Elemental Sulfur    Made infection worse   Gemfibrozil Itching   Iodine    Skin rash   Lipitor [atorvastatin Calcium]    Myalgias   Lovastatin    myalgias   Sulfonamide Derivatives    REACTION: n\T\v   Zetia [ezetimibe]    Cough, dry        Medication List        Accurate as of February 04, 2021 11:59 PM. If you have any questions, ask your nurse or doctor.          STOP taking these medications    clobetasol cream 0.05 % Commonly known as: TEMOVATE Stopped by: Toba Claudio X Levis Nazir, NP       TAKE these medications    Accu-Chek FastClix Lancets Misc USE AS DIRECTED   Accu-Chek SmartView test strip Generic drug: glucose blood CHECK BLOOD SUGAR 3 TIMES DAILY AS DIRECTED.   acetaminophen 500 MG tablet Commonly known as: TYLENOL Take 500 mg by mouth every 6 (six) hours as needed.   allopurinol 100 MG tablet Commonly known as: ZYLOPRIM TAKE 2 TABLETS BY MOUTH ONCE DAILY.   ALPRAZolam 0.25 MG tablet Commonly known as: XANAX TAKE (1) TABLET TWICE A DAY AS NEEDED.   bisoprolol-hydrochlorothiazide 10-6.25 MG tablet Commonly known as: ZIAC TAKE ONE TABLET BY MOUTH ONCE DAILY   esomeprazole 40 MG  capsule Commonly known as: NEXIUM TAKE 1 CAPSULE TWICE DAILY BEFORE MEALS   Fish Oil 1000 MG Caps Take 1 capsule by mouth daily.   FLUoxetine 40 MG capsule Commonly known as: PROZAC TAKE (1) CAPSULE DAILY.   metFORMIN 500 MG tablet Commonly known as: GLUCOPHAGE TAKE 1 TABLET ONCE DAILY.   phenazopyridine 100 MG tablet Commonly known as: Pyridium Take 1 tablet (100 mg total) by mouth 2 (two) times daily. Started by: Yochanan Eddleman X Jayelle Page, NP   PRESERVISION AREDS PO Take 1 tablet by mouth in the morning and at bedtime.   Vitamin D3 50 MCG (2000 UT) capsule Take 2,000 Units by mouth daily.        Review of Systems:  Review of Systems  Constitutional:  Negative for fatigue, fever and unexpected weight change.  HENT:  Positive for hearing loss. Negative for congestion and voice change.   Respiratory:  Negative for cough and shortness of breath.   Cardiovascular:  Negative for leg swelling.  Gastrointestinal:  Negative for abdominal pain and constipation.  Genitourinary:  Positive for dysuria, frequency and urgency. Negative for difficulty urinating and hematuria.       1-2x/night  Musculoskeletal:  Positive for arthralgias, back pain and gait problem.  Skin:  Negative for color change.  Neurological:  Negative for speech difficulty, weakness and light-headedness.  Psychiatric/Behavioral:  Negative for behavioral problems and sleep disturbance. The patient is not nervous/anxious.    Health Maintenance  Topic Date Due   Pneumonia Vaccine 63+ Years old (1 - PCV) 07/21/1946   COVID-19 Vaccine (3 - Booster for Moderna series) 03/24/2020   INFLUENZA VACCINE  10/19/2020   MAMMOGRAM  11/20/2020   HEMOGLOBIN A1C  12/03/2020   FOOT EXAM  06/05/2021  TETANUS/TDAP  03/02/2025   DEXA SCAN  Completed   Zoster Vaccines- Shingrix  Completed   HPV VACCINES  Aged Out   OPHTHALMOLOGY EXAM  Discontinued    Physical Exam: There were no vitals filed for this visit. There is no height or  weight on file to calculate BMI. Physical Exam Constitutional:      Appearance: Normal appearance.  HENT:     Head: Normocephalic and atraumatic.     Nose: Nose normal.     Mouth/Throat:     Mouth: Mucous membranes are moist.  Eyes:     Extraocular Movements: Extraocular movements intact.     Conjunctiva/sclera: Conjunctivae normal.     Pupils: Pupils are equal, round, and reactive to light.  Cardiovascular:     Rate and Rhythm: Normal rate and regular rhythm.     Heart sounds: No murmur heard.    Comments: Weak DP pulses Pulmonary:     Breath sounds: No rales.  Abdominal:     General: Bowel sounds are normal.     Palpations: Abdomen is soft.     Tenderness: There is no abdominal tenderness. There is no right CVA tenderness, left CVA tenderness, guarding or rebound.  Musculoskeletal:     Cervical back: Normal range of motion and neck supple.     Right lower leg: No edema.     Left lower leg: No edema.     Comments: Neck pain, lower back pain radiating to the left buttock/thigh/knee. Left 2nd hammer toe.   Skin:    General: Skin is warm and dry.  Neurological:     General: No focal deficit present.     Mental Status: She is alert and oriented to person, place, and time. Mental status is at baseline.  Psychiatric:        Mood and Affect: Mood normal.        Behavior: Behavior normal.        Thought Content: Thought content normal.        Judgment: Judgment normal.    Labs reviewed: Basic Metabolic Panel: Recent Labs    06/02/20 0715 11/24/20 0705  NA 140 138  K 5.0 4.3  CL 106 100  CO2 27 26  GLUCOSE 106* 102*  BUN 28* 26*  CREATININE 1.15* 1.07*  CALCIUM 9.4 9.1  TSH  --  2.93   Liver Function Tests: Recent Labs    06/02/20 0715 11/24/20 0705  AST 14 15  ALT 12 13  BILITOT 0.5 0.5  PROT 6.3 6.5   No results for input(s): LIPASE, AMYLASE in the last 8760 hours. No results for input(s): AMMONIA in the last 8760 hours. CBC: Recent Labs     06/02/20 0715 11/24/20 0705  WBC 8.9 8.7  NEUTROABS 4,592 4,959  HGB 11.6* 11.8  HCT 34.2* 36.3  MCV 92.9 93.3  PLT 269 268   Lipid Panel: Recent Labs    06/02/20 0715 11/24/20 0705  CHOL 287* 321*  HDL 47* 43*  LDLCALC 189* 215*  TRIG 300* 373*  CHOLHDL 6.1* 7.5*   Lab Results  Component Value Date   HGBA1C 6.6 (H) 06/02/2020    Procedures since last visit: No results found.  Assessment/Plan  Essential hypertension Blood pressure is controlled, takes Beets capsule,  Ziac 10/6.25mg qd.   Dyslipidemia 215 11/24/20, takes Omega 3, allergic to statin.   DDD (degenerative disc disease), cervical cervical and lumbar spine: s/p injs, pain is controlled.   CKD (chronic kidney disease) stage 3,  GFR 30-59 ml/min (HCC) Bun/creat 26/1.07 eGFR 52 11/24/20  Anemia due to chronic kidney disease Hgb 11.8 11/24/20  Chronic GERD stable on Nexium 13m qd, Hgb 11.8 11/24/20  Anxiety Her mood is stable, on Alprazolam 0.275mbid prn, Prozac 4042md.  Gout No acute gout flare ups, uric acid 4.9 01/01/19, on Allopurinol 100m6m.      [facility] type 2 [facility] Met[facility] 500mg87m TSH 2.93 11/24/20, Hgb a1c 6.6 06/02/20  Dysuria c/o discomfort upon urination, urinary frequency, urinary urgency for a couple of days, denied nausea, vomiting, abd pain, or fever. Will obtain UA C/S in am. Recommend the patient to adequate oral hydration, call if new symptoms develop. Will try Pyridium 100mg 74mx 2 days.   UTI (urinary tract infection) Urine culture showed K pneumoniae susceptible to Nitrofurantoin, 7 days [facility]  UA C/S 02/05/21  Next appt:  04/29/2021

## 2021-02-04 NOTE — Assessment & Plan Note (Signed)
Hgb 11.8 11/24/20

## 2021-02-04 NOTE — Assessment & Plan Note (Signed)
No acute gout flare ups, uric acid 4.9 01/01/19, on Allopurinol 100mg  qd.

## 2021-02-04 NOTE — Assessment & Plan Note (Signed)
Bun/creat 26/1.07 eGFR 52 11/24/20

## 2021-02-04 NOTE — Assessment & Plan Note (Signed)
215 11/24/20, takes Omega 3, allergic to statin.

## 2021-02-05 ENCOUNTER — Non-Acute Institutional Stay: Payer: Medicare PPO

## 2021-02-05 ENCOUNTER — Other Ambulatory Visit: Payer: Self-pay

## 2021-02-05 DIAGNOSIS — R300 Dysuria: Secondary | ICD-10-CM

## 2021-02-07 LAB — URINE CULTURE
MICRO NUMBER:: 12659128
SPECIMEN QUALITY:: ADEQUATE

## 2021-02-09 ENCOUNTER — Telehealth: Payer: Self-pay | Admitting: *Deleted

## 2021-02-09 ENCOUNTER — Other Ambulatory Visit: Payer: Self-pay | Admitting: Nurse Practitioner

## 2021-02-09 ENCOUNTER — Encounter: Payer: Self-pay | Admitting: Nurse Practitioner

## 2021-02-09 DIAGNOSIS — R300 Dysuria: Secondary | ICD-10-CM

## 2021-02-09 MED ORDER — NITROFURANTOIN MONOHYD MACRO 100 MG PO CAPS
100.0000 mg | ORAL_CAPSULE | Freq: Two times a day (BID) | ORAL | 0 refills | Status: AC
Start: 2021-02-09 — End: 2021-02-16

## 2021-02-09 NOTE — Telephone Encounter (Signed)
Patient Called wanting to know if you have received her test results. Stated that she has not heard from anyone.   Please Advise.

## 2021-02-09 NOTE — Telephone Encounter (Signed)
Mast, Man X, NP  You 38 minutes ago (10:20 AM)   Nitrofurantoin 100mg  bid x 7days sent to [facility]. Her urine culture is positive for UTI. Thanks.        Tried calling patient, LMOM to return call.

## 2021-02-09 NOTE — Assessment & Plan Note (Signed)
Urine culture showed K pneumoniae susceptible to Nitrofurantoin, 7 days Nitrofurantoin 100mg  bid po sent to [facility].

## 2021-02-09 NOTE — Telephone Encounter (Signed)
LMOM to return call.

## 2021-02-09 NOTE — Telephone Encounter (Signed)
Patient notified and agreed.  

## 2021-02-09 NOTE — Progress Notes (Signed)
Nitrofurantoin 100mg  bid x 7 days ordered.

## 2021-04-05 ENCOUNTER — Other Ambulatory Visit: Payer: Self-pay | Admitting: Nurse Practitioner

## 2021-04-05 NOTE — Telephone Encounter (Signed)
Pharmacy requested refill.  Epic LR: 07/09/2020 No Contract on File.  Pended Rx and sent to [facility] for approval.

## 2021-04-29 ENCOUNTER — Telehealth: Payer: Self-pay

## 2021-04-29 ENCOUNTER — Other Ambulatory Visit: Payer: Self-pay

## 2021-04-29 ENCOUNTER — Ambulatory Visit (INDEPENDENT_AMBULATORY_CARE_PROVIDER_SITE_OTHER): Payer: Medicare PPO | Admitting: Nurse Practitioner

## 2021-04-29 ENCOUNTER — Encounter: Payer: Self-pay | Admitting: Nurse Practitioner

## 2021-04-29 DIAGNOSIS — Z0000 Encounter for general adult medical examination without abnormal findings: Secondary | ICD-10-CM | POA: Diagnosis not present

## 2021-04-29 NOTE — Patient Instructions (Addendum)
Brittany Mcclure , Thank you for taking time to come for your Medicare Wellness Visit. I appreciate your ongoing commitment to your health goals. Please review the following plan we discussed and let me know if I can assist you in the future.   Screening recommendations/referrals: Colonoscopy aged out Mammogram aged out Bone Density please call to schedule.  Recommended yearly ophthalmology/optometry visit for glaucoma screening and checkup Recommended yearly dental visit for hygiene and checkup  Vaccinations: Influenza vaccine  up to date Pneumococcal vaccine up to date Tdap vaccine up to date Shingles vaccine up to date    Advanced directives: on file.   Conditions/risks identified: advanced age, BMI >30, diabetes, hyperlipidemia, hypertension   Next appointment: yearly for AWV   Preventive Care 42 Years and Older, Female Preventive care refers to lifestyle choices and visits with your health care provider that can promote health and wellness. What does preventive care include? A yearly physical exam. This is also called an annual well check. Dental exams once or twice a year. Routine eye exams. Ask your health care provider how often you should have your eyes checked. Personal lifestyle choices, including: Daily care of your teeth and gums. Regular physical activity. Eating a healthy diet. Avoiding tobacco and drug use. Limiting alcohol use. Practicing safe sex. Taking low-dose aspirin every day. Taking vitamin and mineral supplements as recommended by your health care provider. What happens during an annual well check? The services and screenings done by your health care provider during your annual well check will depend on your age, overall health, lifestyle risk factors, and family history of disease. Counseling  Your health care provider may ask you questions about your: Alcohol use. Tobacco use. Drug use. Emotional well-being. Home and relationship well-being. Sexual  activity. Eating habits. History of falls. Memory and ability to understand (cognition). Work and work Statistician. Reproductive health. Screening  You may have the following tests or measurements: Height, weight, and BMI. Blood pressure. Lipid and cholesterol levels. These may be checked every 5 years, or more frequently if you are over 2 years old. Skin check. Lung cancer screening. You may have this screening every year starting at age 52 if you have a 30-pack-year history of smoking and currently smoke or have quit within the past 15 years. Fecal occult blood test (FOBT) of the stool. You may have this test every year starting at age 40. Flexible sigmoidoscopy or colonoscopy. You may have a sigmoidoscopy every 5 years or a colonoscopy every 10 years starting at age 4. Hepatitis C blood test. Hepatitis B blood test. Sexually transmitted disease (STD) testing. Diabetes screening. This is done by checking your blood sugar (glucose) after you have not eaten for a while (fasting). You may have this done every 1-3 years. Bone density scan. This is done to screen for osteoporosis. You may have this done starting at age 11. Mammogram. This may be done every 1-2 years. Talk to your health care provider about how often you should have regular mammograms. Talk with your health care provider about your test results, treatment options, and if necessary, the need for more tests. Vaccines  Your health care provider may recommend certain vaccines, such as: Influenza vaccine. This is recommended every year. Tetanus, diphtheria, and acellular pertussis (Tdap, Td) vaccine. You may need a Td booster every 10 years. Zoster vaccine. You may need this after age 70. Pneumococcal 13-valent conjugate (PCV13) vaccine. One dose is recommended after age 22. Pneumococcal polysaccharide (PPSV23) vaccine. One dose is recommended  after age 69. Talk to your health care provider about which screenings and vaccines  you need and how often you need them. This information is not intended to replace advice given to you by your health care provider. Make sure you discuss any questions you have with your health care provider. Document Released: 04/03/2015 Document Revised: 11/25/2015 Document Reviewed: 01/06/2015 Elsevier Interactive Patient Education  2017 [place] Prevention in the Home Falls can cause injuries. They can happen to people of all ages. There are many things you can do to make your home safe and to help prevent falls. What can I do on the outside of my home? Regularly fix the edges of walkways and driveways and fix any cracks. Remove anything that might make you trip as you walk through a door, such as a raised step or threshold. Trim any bushes or trees on the path to your home. Use bright outdoor lighting. Clear any walking paths of anything that might make someone trip, such as rocks or tools. Regularly check to see if handrails are loose or broken. Make sure that both sides of any steps have handrails. Any raised decks and porches should have guardrails on the edges. Have any leaves, snow, or ice cleared regularly. Use sand or salt on walking paths during winter. Clean up any spills in your garage right away. This includes oil or grease spills. What can I do in the bathroom? Use night lights. Install grab bars by the toilet and in the tub and shower. Do not use towel bars as grab bars. Use non-skid mats or decals in the tub or shower. If you need to sit down in the shower, use a plastic, non-slip stool. Keep the floor dry. Clean up any water that spills on the floor as soon as it happens. Remove soap buildup in the tub or shower regularly. Attach bath mats securely with double-sided non-slip rug tape. Do not have throw rugs and other things on the floor that can make you trip. What can I do in the bedroom? Use night lights. Make sure that you have a light by your bed that  is easy to reach. Do not use any sheets or blankets that are too big for your bed. They should not hang down onto the floor. Have a firm chair that has side arms. You can use this for support while you get dressed. Do not have throw rugs and other things on the floor that can make you trip. What can I do in the kitchen? Clean up any spills right away. Avoid walking on wet floors. Keep items that you use a lot in easy-to-reach places. If you need to reach something above you, use a strong step stool that has a grab bar. Keep electrical cords out of the way. Do not use floor polish or wax that makes floors slippery. If you must use wax, use non-skid floor wax. Do not have throw rugs and other things on the floor that can make you trip. What can I do with my stairs? Do not leave any items on the stairs. Make sure that there are handrails on both sides of the stairs and use them. Fix handrails that are broken or loose. Make sure that handrails are as long as the stairways. Check any carpeting to make sure that it is firmly attached to the stairs. Fix any carpet that is loose or worn. Avoid having throw rugs at the top or bottom of the stairs. If you  do have throw rugs, attach them to the floor with carpet tape. Make sure that you have a light switch at the top of the stairs and the bottom of the stairs. If you do not have them, ask someone to add them for you. What else can I do to help prevent falls? Wear shoes that: Do not have high heels. Have rubber bottoms. Are comfortable and fit you well. Are closed at the toe. Do not wear sandals. If you use a stepladder: Make sure that it is fully opened. Do not climb a closed stepladder. Make sure that both sides of the stepladder are locked into place. Ask someone to hold it for you, if possible. Clearly mark and make sure that you can see: Any grab bars or handrails. First and last steps. Where the edge of each step is. Use tools that help you  move around (mobility aids) if they are needed. These include: Canes. Walkers. Scooters. Crutches. Turn on the lights when you go into a dark area. Replace any light bulbs as soon as they burn out. Set up your furniture so you have a clear path. Avoid moving your furniture around. If any of your floors are uneven, fix them. If there are any pets around you, be aware of where they are. Review your medicines with your doctor. Some medicines can make you feel dizzy. This can increase your chance of falling. Ask your doctor what other things that you can do to help prevent falls. This information is not intended to replace advice given to you by your health care provider. Make sure you discuss any questions you have with your health care provider. Document Released: 01/01/2009 Document Revised: 08/13/2015 Document Reviewed: 04/11/2014 Elsevier Interactive Patient Education  2017 [employer].

## 2021-04-29 NOTE — Telephone Encounter (Signed)
Ms. Brittany Mcclure, broers are scheduled for a virtual visit with your provider today.    Just as we do with appointments in the office, we must obtain your consent to participate.  Your consent will be active for this visit and any virtual visit you may have with one of our providers in the next 365 days.    If you have a MyChart account, I can also send a copy of this consent to you electronically.  All virtual visits are billed to your insurance company just like a traditional visit in the office.  As this is a virtual visit, video technology does not allow for your provider to perform a traditional examination.  This may limit your provider's ability to fully assess your condition.  If your provider identifies any concerns that need to be evaluated in person or the need to arrange testing such as labs, EKG, etc, we will make arrangements to do so.    Although advances in technology are sophisticated, we cannot ensure that it will always work on either your end or our end.  If the connection with a video visit is poor, we may have to switch to a telephone visit.  With either a video or telephone visit, we are not always able to ensure that we have a secure connection.   I need to obtain your verbal consent now.   Are you willing to proceed with your visit today?   [name] has provided verbal consent on 04/29/2021 for a virtual visit (video or telephone).   Carroll Kinds, [facility] 04/29/2021  3:35 PM

## 2021-04-29 NOTE — Progress Notes (Signed)
This service is provided via telemedicine  No vital signs collected/recorded due to the encounter was a telemedicine visit.   Location of patient (ex: home, work):  Home  Patient consents to a telephone visit:  Yes, see encounter dated 04/29/2021  Location of the provider (ex: office, home):  [place]  Name of any referring provider:  Mast, Man X, NP  Names of all persons participating in the telemedicine service and their role in the encounter:  Sherrie Mustache, Nurse Practitioner, Carroll Kinds, CMA, and patient.   Time spent on call:  13 minutes with medical assistant

## 2021-04-29 NOTE — Progress Notes (Signed)
Subjective:   Brittany Mcclure is a 81 y.o. female who presents for Medicare Annual (Subsequent) preventive examination.  Review of Systems     Cardiac Risk Factors include: advanced age (>46men, >53 women);dyslipidemia;hypertension;diabetes mellitus;obesity (BMI >30kg/m2);sedentary lifestyle     Objective:    There were no vitals filed for this visit. There is no height or weight on file to calculate BMI.  Advanced Directives 04/29/2021 11/26/2020 08/27/2020 04/23/2020 03/05/2018 10/17/2017 02/21/2017  Does Patient Have a Medical Advance Directive? Yes No Yes Yes Yes Yes Yes  Type of Paramedic of [place];Living will;Out of facility DNR (pink MOST or yellow form) - [place];Living will;Out of facility DNR (pink MOST or yellow form) [place];Living will;Out of facility DNR (pink MOST or yellow form) [place];Living will;Out of facility DNR (pink MOST or yellow form) [place];Living will;Out of facility DNR (pink MOST or yellow form) [place];Out of facility DNR (pink MOST or yellow form)  Does patient want to make changes to medical advance directive? No - Patient declined - No - Patient declined No - Patient declined No - Patient declined No - Patient declined No - Patient declined  Copy of [place] in Chart? Yes - validated most recent copy scanned in chart (See row information) - Yes - validated most recent copy scanned in chart (See row information) Yes - validated most recent copy scanned in chart (See row information) Yes - validated most recent copy scanned in chart (See row information) Yes Yes  Would patient like information on creating a medical advance directive? - No - Patient declined - - - - -  Pre-existing out of facility DNR order (yellow form or pink MOST form) Yellow form placed in chart (order not valid for inpatient use) - Yellow form placed  in chart (order not valid for inpatient use) - Yellow form placed in chart (order not valid for inpatient use);Pink MOST form placed in chart (order not valid for inpatient use) Yellow form placed in chart (order not valid for inpatient use) Yellow form placed in chart (order not valid for inpatient use);Pink MOST form placed in chart (order not valid for inpatient use)    Current Medications (verified) Outpatient Encounter Medications as of 04/29/2021  Medication Sig   Accu-Chek FastClix Lancets MISC USE AS DIRECTED   ACCU-CHEK SMARTVIEW test strip CHECK BLOOD SUGAR 3 TIMES DAILY AS DIRECTED.   acetaminophen (TYLENOL) 500 MG tablet Take 500 mg by mouth every 6 (six) hours as needed.   allopurinol (ZYLOPRIM) 100 MG tablet TAKE 2 TABLETS BY MOUTH ONCE DAILY.   ALPRAZolam (XANAX) 0.25 MG tablet TAKE (1) TABLET TWICE A DAY AS NEEDED.   bisoprolol-hydrochlorothiazide (ZIAC) 10-6.25 MG tablet Take 1 tablet by mouth daily.   Cholecalciferol (VITAMIN D3) 50 MCG (2000 UT) capsule Take 2,000 Units by mouth daily.   CRANBERRY PO Take by mouth.   esomeprazole (NEXIUM) 40 MG capsule TAKE 1 CAPSULE TWICE DAILY BEFORE MEALS   FLUoxetine (PROZAC) 40 MG capsule TAKE (1) CAPSULE DAILY.   metFORMIN (GLUCOPHAGE) 500 MG tablet TAKE 1 TABLET ONCE DAILY.   Multiple Vitamins-Minerals (PRESERVISION AREDS PO) Take 1 tablet by mouth in the morning and at bedtime.   Omega-3 Fatty Acids (FISH OIL) 1000 MG CAPS Take 1 capsule by mouth daily.   [DISCONTINUED] phenazopyridine (PYRIDIUM) 100 MG tablet Take 1 tablet (100 mg total) by mouth 2 (two) times daily.  No facility-administered encounter medications on file as of 04/29/2021.    Allergies (verified) Crestor [rosuvastatin calcium], Elemental sulfur, Gemfibrozil, Iodine, Lipitor [atorvastatin calcium], Lovastatin, Sulfonamide derivatives, and Zetia [ezetimibe]   History: Past Medical History:  Diagnosis Date   Allergy    Anemia    Anxiety    Arthritis    with  gout   Carotid bruit    right   Cataract    bil cataracts removed   Chest pain    Chronic kidney disease    Colon polyps    Depression    Diabetes type 2, uncontrolled    Diverticulosis    Duodenitis    Exogenous obesity    Gastric ulcer    GERD (gastroesophageal reflux disease)    Hyperlipidemia    Hypertension    Metabolic syndrome    Osteopenia    Seasonal allergies    seasonal   Tubular adenoma of colon    Past Surgical History:  Procedure Laterality Date   COLONOSCOPY     11/90,11/91,1994,2000,2010   DILATION AND CURETTAGE OF UTERUS     DILATION AND CURETTAGE OF UTERUS  1997   FOOT SURGERY     x 2   HAND SURGERY Right    Carpel Tunnel   POLYPECTOMY     TUBAL LIGATION     uterine polyps removed     x2   Family History  Problem Relation Age of Onset   Colon cancer Mother    Stroke Father    Hypertension Father    Heart failure Maternal Grandmother    Diabetes Maternal Grandmother        ?   Rectal cancer Neg Hx    Stomach cancer Neg Hx    Esophageal cancer Neg Hx    Pancreatic cancer Neg Hx    Social History   Socioeconomic History   Marital status: Widowed    Spouse name: Not on file   Number of children: 2   Years of education: Not on file   Highest education level: Not on file  Occupational History   Occupation: retired    Fish farm manager: RETIRED  Tobacco Use   Smoking status: Former   Smokeless tobacco: Never   Tobacco comments:    Late 20's or early 30's  Vaping Use   Vaping Use: Never used  Substance and Sexual Activity   Alcohol use: Yes    Alcohol/week: 0.0 standard drinks    Comment: socially   Drug use: No   Sexual activity: Not on file  Other Topics Concern   Not on file  Social History Narrative   Tobacco use, amount per day now: None      Past tobacco use, amount per day: in her late 59s a pack per day      How many years did you use tobacco: 10      Alcohol use (drinks per week): occasional wine      Diet:      Do you  drink/eat things with caffeine? Tea, soft drinks and coffee      Marital status: Married             What year were you married? 1962      Do you live in a house, apartment, assisted living, condo, trailer? Apartment      Is it one or more stories? Yes      How many persons live in your home? 0      Do you have any  pets in your home? 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 Yes            How often? Water aerobics       Do you have a living will? Yes      Do you have a DNR form? Yes           If not, do you want to discuss one?      Do you have signed POA/HPOA forms?  Yes            Social Determinants of Health   Financial Resource Strain: Not on file  Food Insecurity: Not on file  Transportation Needs: Not on file  Physical Activity: Not on file  Stress: Not on file  Social Connections: Not on file    Tobacco Counseling Counseling given: Not Answered Tobacco comments: Late 20's or early 30's   Clinical Intake:  Pre-visit preparation completed: Yes  Pain : No/denies pain     BMI - recorded: 36 Nutritional Risks: None Diabetes: Yes  How often do you need to have someone help you when you read instructions, pamphlets, or other written materials from your doctor or pharmacy?: 1 - Never  [place]         Activities of Daily Living In your present state of health, do you have any difficulty performing the following activities: 04/29/2021  Hearing? Y  Vision? N  Difficulty concentrating or making decisions? N  Walking or climbing stairs? N  Dressing or bathing? N  Doing errands, shopping? N  Preparing Food and eating ? N  Using the Toilet? N  In the past six months, have you accidently leaked urine? N  Do you have problems with loss of bowel control? N  Managing your Medications? N  Managing your Finances? N  Housekeeping or managing your Housekeeping? N  Some recent data might be hidden    Patient Care  Team: Mast, Man X, NP as PCP - General (Internal Medicine) Iran Planas, MD as Consulting Physician (Orthopedic Surgery) Marygrace Drought, MD as Consulting Physician (Ophthalmology)  Indicate any recent Medical Services you may have received from other than [facility] providers in the past year (date may be approximate).     Assessment:   This is a routine wellness examination for Oretta.  Hearing/Vision screen Hearing Screening - Comments:: Patient wears hearing aids. Vision Screening - Comments:: Patient has no vision problems. Patient has last eye exam about 6 months ago. Patient sees Dr. Satira Sark  Dietary issues and exercise activities discussed: Current Exercise Habits: Structured exercise class;Home exercise routine, Type of exercise: calisthenics, Time (Minutes): 60, Frequency (Times/Week): 3, Weekly Exercise (Minutes/Week): 180   Goals Addressed   None    Depression Screen PHQ 2/9 Scores 04/29/2021 04/23/2020 03/05/2018 01/18/2018 02/21/2017 06/12/2015 02/05/2015  PHQ - 2 Score 0 0 0 0 1 0 0    Fall Risk Fall Risk  04/29/2021 02/04/2021 11/26/2020 08/27/2020 04/23/2020  Falls in the past year? 0 0 0 0 1  Number falls in past yr: 0 0 0 0 0  Injury with Fall? 0 0 0 0 0  Risk for fall due to : No Fall Risks No Fall Risks No Fall Risks - -  Follow up Falls evaluation completed Falls evaluation completed Falls evaluation completed - -    FALL RISK PREVENTION PERTAINING TO THE HOME:  Any stairs in or around the home? No  If  so, are there any without handrails? No  Home free of loose throw rugs in walkways, pet beds, electrical cords, etc? Yes  Adequate lighting in your home to reduce risk of falls? Yes   ASSISTIVE DEVICES UTILIZED TO PREVENT FALLS:  Life alert? Yes  Use of a cane, walker or w/c? No  Grab bars in the bathroom? Yes  Shower chair or bench in shower? No  Elevated toilet seat or a handicapped toilet? Yes   TIMED UP AND GO:  Was the test performed? No .    Cognitive  Function: MMSE - Mini Mental State Exam 03/05/2018 02/21/2017  Orientation to time 5 4  Orientation to Place 5 5  Registration 3 3  Attention/ Calculation 5 5  Recall 3 2  Language- name 2 objects 2 2  Language- repeat 1 1  Language- follow 3 step command 3 3  Language- read & follow direction 1 1  Write a sentence 1 1  Copy design 1 1  Total score 30 28     6CIT Screen 04/29/2021 04/23/2020  What Year? 0 points 0 points  What month? 0 points 0 points  What time? 0 points 0 points  Count back from 20 0 points 0 points  Months in reverse 0 points 4 points  Repeat phrase 0 points 2 points  Total Score 0 6    Immunizations Immunization History  Administered Date(s) Administered   Fluad Quad(high Dose 65+) 12/27/2018   Influenza Whole 12/27/2010   Influenza, High Dose Seasonal PF 12/06/2017   Influenza-Unspecified 10/20/2014, 12/19/2016, 12/02/2020   Moderna Sars-Covid-2 Vaccination 03/25/2019, 04/22/2019, 01/28/2020, 08/18/2020   Pfizer Covid-19 Vaccine Bivalent Booster 35yrs & up 12/02/2020   Pneumococcal-Unspecified 09/19/2014   Tdap 03/03/2015   Zoster Recombinat (Shingrix) 12/06/2017, 02/20/2018   Zoster, Live 09/19/2014    TDAP status: Up to date  Flu Vaccine status: Up to date  Pneumococcal vaccine status: Up to date  Covid-19 vaccine status: Completed vaccines  Qualifies for Shingles Vaccine? Yes   Zostavax completed Yes   Shingrix Completed?: Yes  Screening Tests Health Maintenance  Topic Date Due   Pneumonia Vaccine 37+ Years old (1 - PCV) 07/22/2003   MAMMOGRAM  11/20/2020   HEMOGLOBIN A1C  12/03/2020   FOOT EXAM  06/05/2021   TETANUS/TDAP  03/02/2025   INFLUENZA VACCINE  Completed   DEXA SCAN  Completed   COVID-19 Vaccine  Completed   Zoster Vaccines- Shingrix  Completed   HPV VACCINES  Aged Out   OPHTHALMOLOGY EXAM  Discontinued    Health Maintenance  Health Maintenance Due  Topic Date Due   Pneumonia Vaccine 64+ Years old (1 - PCV)  07/22/2003   MAMMOGRAM  11/20/2020   HEMOGLOBIN A1C  12/03/2020    Colorectal cancer screening: No longer required.   Mammogram status: No longer required due to age.  Bone Density status: Ordered needs to schedule. Marland Kitchen Pt provided with contact info and advised to call to schedule appt.  Lung Cancer Screening: (Low Dose CT Chest recommended if Age 67-80 years, 30 pack-year currently smoking OR have quit w/in 15years.) does not qualify.   Lung Cancer Screening Referral: na  Additional Screening:  Hepatitis C Screening: does not qualify; Completed  Vision Screening: Recommended annual ophthalmology exams for early detection of glaucoma and other disorders of the eye. Is the patient up to date with their annual eye exam?  Yes  Who is the provider or what is the name of the office in which the patient  attends annual eye exams? [facility] If pt is not established with a provider, would they like to be referred to a provider to establish care? No .   Dental Screening: Recommended annual dental exams for proper oral hygiene  Community Resource Referral / Chronic Care Management: CRR required this visit?  No   CCM required this visit?  No      Plan:     I have personally reviewed and noted the following in the patients chart:   Medical and social history Use of alcohol, tobacco or illicit drugs  Current medications and supplements including opioid prescriptions.  Functional ability and status Nutritional status Physical activity Advanced directives List of other physicians Hospitalizations, surgeries, and ER visits in previous 12 months Vitals Screenings to include cognitive, depression, and falls Referrals and appointments  In addition, I have reviewed and discussed with patient certain preventive protocols, quality metrics, and best practice recommendations. A written personalized care plan for preventive services as well as general preventive health recommendations were  provided to patient.     Lauree Chandler, NP   04/29/2021    Virtual Visit via Telephone Note  I connected with patient 04/29/21 at  3:15 PM EST by telephone and verified that I am speaking with the correct person using two identifiers.  Location: Patient: home Provider: [facility]   I discussed the limitations, risks, security and privacy concerns of performing an evaluation and management service by telephone and the availability of in person appointments. I also discussed with the patient that there may be a patient responsible charge related to this service. The patient expressed understanding and agreed to proceed.   I discussed the assessment and treatment plan with the patient. The patient was provided an opportunity to ask questions and all were answered. The patient agreed with the plan and demonstrated an understanding of the instructions.   The patient was advised to call back or seek an in-person evaluation if the symptoms worsen or if the condition fails to improve as anticipated.  I provided 15 minutes of non-face-to-face time during this encounter.  Carlos American. Harle Battiest Avs printed and mailed

## 2021-05-27 ENCOUNTER — Other Ambulatory Visit: Payer: Self-pay

## 2021-05-27 ENCOUNTER — Encounter: Payer: Self-pay | Admitting: Nurse Practitioner

## 2021-05-27 ENCOUNTER — Non-Acute Institutional Stay: Payer: Medicare PPO | Admitting: Nurse Practitioner

## 2021-05-27 VITALS — BP 138/96 | HR 67 | Temp 97.1°F | Ht 62.0 in | Wt 200.0 lb

## 2021-05-27 DIAGNOSIS — E669 Obesity, unspecified: Secondary | ICD-10-CM

## 2021-05-27 DIAGNOSIS — I10 Essential (primary) hypertension: Secondary | ICD-10-CM

## 2021-05-27 DIAGNOSIS — M1A00X0 Idiopathic chronic gout, unspecified site, without tophus (tophi): Secondary | ICD-10-CM

## 2021-05-27 DIAGNOSIS — E785 Hyperlipidemia, unspecified: Secondary | ICD-10-CM

## 2021-05-27 DIAGNOSIS — N1831 Chronic kidney disease, stage 3a: Secondary | ICD-10-CM

## 2021-05-27 DIAGNOSIS — F419 Anxiety disorder, unspecified: Secondary | ICD-10-CM

## 2021-05-27 DIAGNOSIS — E1169 Type 2 diabetes mellitus with other specified complication: Secondary | ICD-10-CM

## 2021-05-27 DIAGNOSIS — Z87828 Personal history of other (healed) physical injury and trauma: Secondary | ICD-10-CM

## 2021-05-27 DIAGNOSIS — K219 Gastro-esophageal reflux disease without esophagitis: Secondary | ICD-10-CM

## 2021-05-27 DIAGNOSIS — M5030 Other cervical disc degeneration, unspecified cervical region: Secondary | ICD-10-CM

## 2021-05-27 DIAGNOSIS — D631 Anemia in chronic kidney disease: Secondary | ICD-10-CM

## 2021-05-27 NOTE — Assessment & Plan Note (Addendum)
Dbp is mildly elevated, the patient denied headache, dizziness, change of vision, chest pain/pressure, or SOB, takes Beets capsule,  Ziac 10/6.'25mg'$  qd. will continue observe Bp ?

## 2021-05-27 NOTE — Assessment & Plan Note (Signed)
takes Metformin.  TSH 2.93 11/24/20, Hgb a1c 6.6 06/02/20, update Hgb a1c ?

## 2021-05-27 NOTE — Assessment & Plan Note (Signed)
[place], struck  head on a raised trip of  tiled floor about a year ago, noted some memory recall issues, but denied headache, change of vision, nausea, vomiting, or focal weakness. There is indented line through forehead extended to the left parietal region, no apparent scar, will CT w/o CM to evaluate further.  ?

## 2021-05-27 NOTE — Progress Notes (Signed)
Location:   clinic [place]   Place of Service:  Clinic (12) Provider: Marlana Latus NP  Code Status: DNR Goals of Care: [place] Advanced Directives 05/27/2021  Does Patient Have a Medical Advance Directive? Yes  Type of Paramedic of [place];Living will;Out of facility DNR (pink MOST or yellow form)  Does patient want to make changes to medical advance directive? No - Patient declined  Copy of [place] in Chart? Yes - validated most recent copy scanned in chart (See row information)  Would patient like information on creating a medical advance directive? -  Pre-existing out of facility DNR order (yellow form or pink MOST form) Yellow form placed in chart (order not valid for inpatient use)     Chief Complaint  Patient presents with   Medical Management of Chronic Issues    Patient returns to the clinic for 6 month follow up and discuss her left side le pain.    Quality Metric Gaps    Verified [facility] and [facility] patient is due for PCV vaccine Prevnar 13 given in 2016 and Mammogram.     HPI: Patient is a 81 y.o. female seen today for medical management of chronic diseases.     T2DM, takes Metformin.  TSH 2.93 11/24/20, Hgb a1c 6.6 06/02/20             No acute gout flare ups, uric acid 4.9 01/01/19, on Allopurinol             Her mood is stable, on Alprazolam 0.17m bid prn, Prozac 447mqd.             GERD, stable on Nexium 4085md, Hgb 11.8 11/24/20             HTN, takes Beets capsule,  Ziac 10/6.25mg qd.              LDL 215 11/24/20, takes Omega 3, allergic to statin.              DDD cervical and lumbar spine: s/p injs, pain is controlled.              CKD Bun/creat 26/1.07 eGFR 52 11/24/20             Anemia, Hgb 11.8 11/24/20  Past Medical History:  Diagnosis Date   Allergy    Anemia    Anxiety    Arthritis    with gout   Carotid bruit    right   Cataract    bil cataracts removed   Chest pain    Chronic kidney disease    Colon polyps     Depression    Diabetes type 2, uncontrolled    Diverticulosis    Duodenitis    Exogenous obesity    Gastric ulcer    GERD (gastroesophageal reflux disease)    Hyperlipidemia    Hypertension    Metabolic syndrome    Osteopenia    Seasonal allergies    seasonal   Tubular adenoma of colon     Past Surgical History:  Procedure Laterality Date   COLONOSCOPY     11/90,11/91,1994,2000,2010   DILATION AND CURETTAGE OF UTERUS     DILATION AND CURETTAGE OF UTERUS  1997   FOOT SURGERY     x 2   HAND SURGERY Right    Carpel Tunnel   POLYPECTOMY     TUBAL LIGATION     uterine polyps removed     x2  Allergies  Allergen Reactions   Crestor [Rosuvastatin Calcium]     Myalgias    Elemental Sulfur     Made infection worse   Gemfibrozil Itching   Iodine     Skin rash   Lipitor [Atorvastatin Calcium]     Myalgias    Lovastatin     myalgias   Sulfonamide Derivatives     REACTION: n\T\v   Zetia [Ezetimibe]     Cough, dry    Allergies as of 05/27/2021       Reactions   Crestor [rosuvastatin Calcium]    Myalgias   Elemental Sulfur    Made infection worse   Gemfibrozil Itching   Iodine    Skin rash   Lipitor [atorvastatin Calcium]    Myalgias   Lovastatin    myalgias   Sulfonamide Derivatives    REACTION: n\T\v   Zetia [ezetimibe]    Cough, dry        Medication List        Accurate as of May 27, 2021 11:59 PM. If you have any questions, ask your nurse or doctor.          Accu-Chek FastClix Lancets Misc USE AS DIRECTED   Accu-Chek SmartView test strip Generic drug: glucose blood CHECK BLOOD SUGAR 3 TIMES DAILY AS DIRECTED.   acetaminophen 500 MG tablet Commonly known as: TYLENOL Take 500 mg by mouth every 6 (six) hours as needed.   allopurinol 100 MG tablet Commonly known as: ZYLOPRIM TAKE 2 TABLETS BY MOUTH ONCE DAILY.   ALPRAZolam 0.25 MG tablet Commonly known as: XANAX TAKE (1) TABLET TWICE A DAY AS NEEDED.    bisoprolol-hydrochlorothiazide 10-6.25 MG tablet Commonly known as: ZIAC Take 1 tablet by mouth daily.   CRANBERRY PO Take by mouth.   esomeprazole 40 MG capsule Commonly known as: NEXIUM TAKE 1 CAPSULE TWICE DAILY BEFORE MEALS   Fish Oil 1000 MG Caps Take 1 capsule by mouth daily.   FLUoxetine 40 MG capsule Commonly known as: PROZAC TAKE (1) CAPSULE DAILY.   metFORMIN 500 MG tablet Commonly known as: GLUCOPHAGE TAKE 1 TABLET ONCE DAILY.   NON FORMULARY Beet Root 1 capsule daily   PRESERVISION AREDS PO Take 1 tablet by mouth in the morning and at bedtime.   Vitamin D3 50 MCG (2000 UT) capsule Take 2,000 Units by mouth daily.        Review of Systems:  Review of Systems  Health Maintenance  Topic Date Due   Pneumonia Vaccine 40+ Years old (1 - PCV) 07/22/2003   MAMMOGRAM  11/20/2020   HEMOGLOBIN A1C  12/03/2020   FOOT EXAM  06/05/2021   TETANUS/TDAP  03/02/2025   INFLUENZA VACCINE  Completed   DEXA SCAN  Completed   COVID-19 Vaccine  Completed   Zoster Vaccines- Shingrix  Completed   HPV VACCINES  Aged Out   OPHTHALMOLOGY EXAM  Discontinued    Physical Exam: Vitals:   05/27/21 1438  BP: (!) 138/96  Pulse: 67  Temp: (!) 97.1 F (36.2 C)  SpO2: 97%  Weight: 200 lb (90.7 kg)  Height: '5\' 2"'  (1.575 m)   Body mass index is 36.58 kg/m. Physical Exam Constitutional:      Appearance: Normal appearance.  HENT:     Head: Normocephalic.     Comments:  There is indented line through forehead extended to the left parietal region, no apparent scar,     Nose: Nose normal.     Mouth/Throat:     Mouth:  Mucous membranes are moist.  Eyes:     Extraocular Movements: Extraocular movements intact.     Conjunctiva/sclera: Conjunctivae normal.     Pupils: Pupils are equal, round, and reactive to light.  Cardiovascular:     Rate and Rhythm: Normal rate and regular rhythm.     Heart sounds: No murmur heard.    Comments: Weak DP pulses Pulmonary:     Breath  sounds: No rales.  Abdominal:     General: Bowel sounds are normal.     Palpations: Abdomen is soft.     Tenderness: There is no abdominal tenderness.  Musculoskeletal:     Cervical back: Normal range of motion and neck supple.     Right lower leg: No edema.     Left lower leg: No edema.     Comments: Neck pain, lower back pain radiating to the left buttock/thigh/knee. Left 2nd hammer toe.   Skin:    General: Skin is warm and dry.  Neurological:     General: No focal deficit present.     Mental Status: She is alert and oriented to person, place, and time. Mental status is at baseline.  Psychiatric:        Mood and Affect: Mood normal.        Behavior: Behavior normal.        Thought Content: Thought content normal.        Judgment: Judgment normal.    Labs reviewed: Basic Metabolic Panel: Recent Labs    06/02/20 0715 11/24/20 0705  NA 140 138  K 5.0 4.3  CL 106 100  CO2 27 26  GLUCOSE 106* 102*  BUN 28* 26*  CREATININE 1.15* 1.07*  CALCIUM 9.4 9.1  TSH  --  2.93   Liver Function Tests: Recent Labs    06/02/20 0715 11/24/20 0705  AST 14 15  ALT 12 13  BILITOT 0.5 0.5  PROT 6.3 6.5   No results for input(s): LIPASE, AMYLASE in the last 8760 hours. No results for input(s): AMMONIA in the last 8760 hours. CBC: Recent Labs    06/02/20 0715 11/24/20 0705  WBC 8.9 8.7  NEUTROABS 4,592 4,959  HGB 11.6* 11.8  HCT 34.2* 36.3  MCV 92.9 93.3  PLT 269 268   Lipid Panel: Recent Labs    06/02/20 0715 11/24/20 0705  CHOL 287* 321*  HDL 47* 43*  LDLCALC 189* 215*  TRIG 300* 373*  CHOLHDL 6.1* 7.5*   Lab Results  Component Value Date   HGBA1C 6.6 (H) 06/02/2020    Procedures since last visit: No results found.  Assessment/Plan  Essential hypertension Dbp is mildly elevated, the patient denied headache, dizziness, change of vision, chest pain/pressure, or SOB, takes Beets capsule,  Ziac 10/6.25mg qd. will continue observe Bp  CKD (chronic kidney  disease) stage 3, GFR 30-59 ml/min (HCC) Bun/creat 26/1.07 eGFR 52 11/24/20, update CMP/eGFR  Diabetes mellitus type 2 in obese [facility])  takes Metformin.  TSH 2.93 11/24/20, Hgb a1c 6.6 06/02/20, update Hgb a1c  Gout  takes Metformin.  TSH 2.93 11/24/20, Hgb a1c 6.6 06/02/20, update uric acid.   Anxiety on Alprazolam 0.86m bid prn, Prozac 458mqd. Update TSH  Chronic GERD  stable on Nexium 4077md, Hgb 11.8 11/24/20  Dyslipidemia 215 11/24/20, takes Omega 3, allergic to statin. update lipid panel.   DDD (degenerative disc disease), cervical  s/p injs, pain is controlled.   Anemia due to chronic kidney disease Hgb 11.8 11/24/20, update CBC/diff.  History of traumatic injury of head [place], struck  head on a raised trip of  tiled floor about a year ago, noted some memory recall issues, but denied headache, change of vision, nausea, vomiting, or focal weakness. There is indented line through forehead extended to the left parietal region, no apparent scar, will CT w/o CM to evaluate further.    Labs/tests ordered: CBC/diff, CMP/eGFR, Hgb a1c, TSH, lipid panel. CT head w/o CM Next appt:  6 months

## 2021-05-27 NOTE — Assessment & Plan Note (Signed)
Bun/creat 26/1.07 eGFR 52 11/24/20, update CMP/eGFR 

## 2021-05-27 NOTE — Assessment & Plan Note (Signed)
stable on Nexium '40mg'$  qd, Hgb 11.8 11/24/20 ?

## 2021-05-27 NOTE — Assessment & Plan Note (Signed)
takes Metformin.  TSH 2.93 11/24/20, Hgb a1c 6.6 06/02/20, update uric acid.  ?

## 2021-05-27 NOTE — Assessment & Plan Note (Signed)
s/p injs, pain is controlled.  ?

## 2021-05-27 NOTE — Assessment & Plan Note (Signed)
Hgb 11.8 11/24/20, update CBC/diff.  ?

## 2021-05-27 NOTE — Assessment & Plan Note (Signed)
215 11/24/20, takes Omega 3, allergic to statin. update lipid panel.  

## 2021-05-27 NOTE — Assessment & Plan Note (Signed)
on Alprazolam 0.'25mg'$  bid prn, Prozac '40mg'$  qd. Update TSH ?

## 2021-06-15 ENCOUNTER — Encounter: Payer: Self-pay | Admitting: *Deleted

## 2021-06-15 DIAGNOSIS — H524 Presbyopia: Secondary | ICD-10-CM | POA: Diagnosis not present

## 2021-06-15 DIAGNOSIS — H353132 Nonexudative age-related macular degeneration, bilateral, intermediate dry stage: Secondary | ICD-10-CM | POA: Diagnosis not present

## 2021-06-15 DIAGNOSIS — H52203 Unspecified astigmatism, bilateral: Secondary | ICD-10-CM | POA: Diagnosis not present

## 2021-06-15 DIAGNOSIS — E119 Type 2 diabetes mellitus without complications: Secondary | ICD-10-CM | POA: Diagnosis not present

## 2021-06-15 LAB — HM DIABETES EYE EXAM

## 2021-06-23 ENCOUNTER — Ambulatory Visit
Admission: RE | Admit: 2021-06-23 | Discharge: 2021-06-23 | Disposition: A | Discharge status: Home / Self Care | Payer: Medicare PPO | Source: Ambulatory Visit | Attending: Nurse Practitioner | Admitting: Nurse Practitioner

## 2021-06-23 DIAGNOSIS — Z87828 Personal history of other (healed) physical injury and trauma: Secondary | ICD-10-CM

## 2021-06-23 DIAGNOSIS — S0990XA Unspecified injury of head, initial encounter: Secondary | ICD-10-CM | POA: Diagnosis not present

## 2021-06-23 DIAGNOSIS — R55 Syncope and collapse: Secondary | ICD-10-CM | POA: Diagnosis not present

## 2021-07-06 ENCOUNTER — Telehealth: Payer: Self-pay

## 2021-07-06 NOTE — Telephone Encounter (Signed)
Patient called asking for results of recent MRI please advise ?

## 2021-07-06 NOTE — Telephone Encounter (Signed)
Below is ManX's  response: ? ?Mast, Man X, NP  Winifred Bodiford L, CMA ?Caller: Unspecified (Today,  1:40 PM) ?Please inform the patient her CT head without contrast media showed Unremarkable head CT for age. Thanks.  ? ? ?Verbal verification of understanding from patient ? ?

## 2021-10-11 ENCOUNTER — Other Ambulatory Visit: Payer: Self-pay | Admitting: Nurse Practitioner

## 2021-10-26 ENCOUNTER — Other Ambulatory Visit: Payer: Self-pay | Admitting: Nurse Practitioner

## 2021-11-18 DIAGNOSIS — M1A00X0 Idiopathic chronic gout, unspecified site, without tophus (tophi): Secondary | ICD-10-CM

## 2021-11-18 DIAGNOSIS — E785 Hyperlipidemia, unspecified: Secondary | ICD-10-CM

## 2021-11-18 DIAGNOSIS — E669 Obesity, unspecified: Secondary | ICD-10-CM

## 2021-11-18 DIAGNOSIS — N1831 Chronic kidney disease, stage 3a: Secondary | ICD-10-CM

## 2021-11-18 DIAGNOSIS — F419 Anxiety disorder, unspecified: Secondary | ICD-10-CM

## 2021-11-24 ENCOUNTER — Encounter: Payer: Self-pay | Admitting: Nurse Practitioner

## 2021-11-24 NOTE — Telephone Encounter (Signed)
This encounter was created in error - please disregard.

## 2021-11-25 ENCOUNTER — Non-Acute Institutional Stay: Payer: Medicare PPO | Admitting: Nurse Practitioner

## 2021-11-25 ENCOUNTER — Encounter: Payer: Self-pay | Admitting: Nurse Practitioner

## 2021-11-25 DIAGNOSIS — N1831 Chronic kidney disease, stage 3a: Secondary | ICD-10-CM

## 2021-11-25 DIAGNOSIS — K219 Gastro-esophageal reflux disease without esophagitis: Secondary | ICD-10-CM

## 2021-11-25 DIAGNOSIS — E785 Hyperlipidemia, unspecified: Secondary | ICD-10-CM

## 2021-11-25 DIAGNOSIS — D631 Anemia in chronic kidney disease: Secondary | ICD-10-CM

## 2021-11-25 DIAGNOSIS — F419 Anxiety disorder, unspecified: Secondary | ICD-10-CM

## 2021-11-25 DIAGNOSIS — M5030 Other cervical disc degeneration, unspecified cervical region: Secondary | ICD-10-CM

## 2021-11-25 DIAGNOSIS — E1169 Type 2 diabetes mellitus with other specified complication: Secondary | ICD-10-CM | POA: Diagnosis not present

## 2021-11-25 DIAGNOSIS — N183 Chronic kidney disease, stage 3 unspecified: Secondary | ICD-10-CM

## 2021-11-25 DIAGNOSIS — I10 Essential (primary) hypertension: Secondary | ICD-10-CM

## 2021-11-25 DIAGNOSIS — M1A00X0 Idiopathic chronic gout, unspecified site, without tophus (tophi): Secondary | ICD-10-CM

## 2021-11-25 DIAGNOSIS — E669 Obesity, unspecified: Secondary | ICD-10-CM

## 2021-11-25 NOTE — Assessment & Plan Note (Signed)
Her mood is stable, on Alprazolam 0.'25mg'$  bid prn, Prozac CT 06/23/21 unremarkable.

## 2021-11-25 NOTE — Progress Notes (Signed)
Location:   Clinic [facility]   Place of Service:   clinic [place] Provider: Marlana Latus NP  Code Status: DNR Goals of Care: [place]    11/24/2021   11:55 AM  Advanced Directives  Does Patient Have a Medical Advance Directive? No  Would patient like information on creating a medical advance directive? No - Patient declined     Chief Complaint  Patient presents with   Medical Management of Chronic Issues    Patient is here for a 46M follow up for chronic conditions, patient is also due for a mammogram, foot exam and several vaccines, discuss the need to update     HPI: Patient is a 81 y.o. female seen today for medical management of chronic diseases.     T2DM, takes Metformin.  TSH 2.93 11/24/20, Hgb a1c 6.6 06/02/20             No acute gout flare ups, uric acid 4.9 01/01/19, on Allopurinol             Her mood is stable, on Alprazolam 0.66m bid prn, Prozac CT 06/23/21 unremarkable.              GERD, stable on Nexium, Hgb 11.8 11/24/20             HTN, takes Beets capsule,  Ziac 10/6.25mg qd.              LDL 215 11/24/20, takes Omega 3, allergic to statin.              DDD cervical and lumbar spine: s/p injs, pain is controlled.              CKD Bun/creat 26/1.07 eGFR 52 11/24/20             Anemia, Hgb 11.8 11/24/20  Past Medical History:  Diagnosis Date   Allergy    Anemia    Anxiety    Arthritis    with gout   Carotid bruit    right   Cataract    bil cataracts removed   Chest pain    Chronic kidney disease    Colon polyps    Depression    Diabetes type 2, uncontrolled    Diverticulosis    Duodenitis    Exogenous obesity    Gastric ulcer    GERD (gastroesophageal reflux disease)    Hyperlipidemia    Hypertension    Metabolic syndrome    Osteopenia    Seasonal allergies    seasonal   Tubular adenoma of colon     Past Surgical History:  Procedure Laterality Date   COLONOSCOPY     11/90,11/91,1994,2000,2010   DILATION AND CURETTAGE OF UTERUS     DILATION AND CURETTAGE OF  UTERUS  1997   FOOT SURGERY     x 2   HAND SURGERY Right    Carpel Tunnel   POLYPECTOMY     TUBAL LIGATION     uterine polyps removed     x2    Allergies  Allergen Reactions   Crestor [Rosuvastatin Calcium]     Myalgias    Elemental Sulfur     Made infection worse   Gemfibrozil Itching   Iodine     Skin rash   Lipitor [Atorvastatin Calcium]     Myalgias    Lovastatin     myalgias   Sulfonamide Derivatives     REACTION: n\T\v   Zetia [Ezetimibe]     Cough, dry  Allergies as of 11/25/2021       Reactions   Crestor [rosuvastatin Calcium]    Myalgias   Elemental Sulfur    Made infection worse   Gemfibrozil Itching   Iodine    Skin rash   Lipitor [atorvastatin Calcium]    Myalgias   Lovastatin    myalgias   Sulfonamide Derivatives    REACTION: n\T\v   Zetia [ezetimibe]    Cough, dry        Medication List        Accurate as of November 25, 2021 11:59 PM. If you have any questions, ask your nurse or doctor.          Accu-Chek FastClix Lancets Misc USE AS DIRECTED   Accu-Chek SmartView test strip Generic drug: glucose blood CHECK BLOOD SUGAR 3 TIMES DAILY AS DIRECTED.   acetaminophen 500 MG tablet Commonly known as: TYLENOL Take 500 mg by mouth every 6 (six) hours as needed.   allopurinol 100 MG tablet Commonly known as: ZYLOPRIM TAKE 2 TABLETS BY MOUTH ONCE DAILY.   ALPRAZolam 0.25 MG tablet Commonly known as: XANAX TAKE (1) TABLET TWICE A DAY AS NEEDED.   bisoprolol-hydrochlorothiazide 10-6.25 MG tablet Commonly known as: ZIAC TAKE ONE TABLET BY MOUTH DAILY   CRANBERRY PO Take by mouth.   esomeprazole 40 MG capsule Commonly known as: NEXIUM TAKE 1 CAPSULE TWICE DAILY BEFORE MEALS   Fish Oil 1000 MG Caps Take 1 capsule by mouth daily.   FLUoxetine 40 MG capsule Commonly known as: PROZAC TAKE (1) CAPSULE DAILY.   metFORMIN 500 MG tablet Commonly known as: GLUCOPHAGE TAKE 1 TABLET ONCE DAILY.   NON FORMULARY Beet Root 1  capsule daily   PRESERVISION AREDS PO Take 1 tablet by mouth in the morning and at bedtime.   Vitamin D3 50 MCG (2000 UT) capsule Take 2,000 Units by mouth daily.        Review of Systems:  Review of Systems  Constitutional:  Negative for fatigue, fever and unexpected weight change.  HENT:  Positive for hearing loss. Negative for congestion and voice change.   Respiratory:  Negative for cough and shortness of breath.   Cardiovascular:  Negative for leg swelling.  Gastrointestinal:  Negative for abdominal pain and constipation.  Genitourinary:  Positive for dysuria, frequency and urgency. Negative for difficulty urinating and hematuria.       1-2x/night  Musculoskeletal:  Positive for arthralgias, back pain and gait problem.  Skin:  Negative for color change.  Neurological:  Negative for speech difficulty, weakness and light-headedness.  Psychiatric/Behavioral:  Negative for behavioral problems and sleep disturbance. The patient is not nervous/anxious.     Health Maintenance  Topic Date Due   Pneumonia Vaccine 46+ Years old (1 - PCV) 07/22/2003   MAMMOGRAM  11/20/2020   HEMOGLOBIN A1C  12/03/2020   COVID-19 Vaccine (6 - Moderna series) 04/03/2021   FOOT EXAM  06/05/2021   INFLUENZA VACCINE  10/19/2021   TETANUS/TDAP  03/02/2025   DEXA SCAN  Completed   Zoster Vaccines- Shingrix  Completed   HPV VACCINES  Aged Out   OPHTHALMOLOGY EXAM  Discontinued    Physical Exam: Vitals:   11/25/21 1446  BP: (!) 132/90  Pulse: 60  Resp: 20  Temp: (!) 97.5 F (36.4 C)  SpO2: 98%  Weight: 196 lb (88.9 kg)  Height: '5\' 2"'  (1.575 m)   Body mass index is 35.85 kg/m. Physical Exam Constitutional:      Appearance: Normal appearance.  HENT:     Head: Normocephalic.     Comments:  There is indented line through forehead extended to the left parietal region, no apparent scar,     Nose: Nose normal.     Mouth/Throat:     Mouth: Mucous membranes are moist.  Eyes:     Extraocular  Movements: Extraocular movements intact.     Conjunctiva/sclera: Conjunctivae normal.     Pupils: Pupils are equal, round, and reactive to light.  Cardiovascular:     Rate and Rhythm: Normal rate and regular rhythm.     Heart sounds: No murmur heard.    Comments: Weak DP pulses Pulmonary:     Breath sounds: No rales.  Abdominal:     General: Bowel sounds are normal.     Palpations: Abdomen is soft.     Tenderness: There is no abdominal tenderness.  Musculoskeletal:     Cervical back: Normal range of motion and neck supple.     Right lower leg: No edema.     Left lower leg: No edema.     Comments: Neck pain, lower back pain radiating to the left buttock/thigh/knee. Left 2nd hammer toe.   Skin:    General: Skin is warm and dry.  Neurological:     General: No focal deficit present.     Mental Status: She is alert and oriented to person, place, and time. Mental status is at baseline.  Psychiatric:        Mood and Affect: Mood normal.        Behavior: Behavior normal.        Thought Content: Thought content normal.        Judgment: Judgment normal.     Labs reviewed: Basic Metabolic Panel: No results for input(s): "NA", "K", "CL", "CO2", "GLUCOSE", "BUN", "CREATININE", "CALCIUM", "MG", "PHOS", "TSH" in the last 8760 hours. Liver Function Tests: No results for input(s): "AST", "ALT", "ALKPHOS", "BILITOT", "PROT", "ALBUMIN" in the last 8760 hours. No results for input(s): "LIPASE", "AMYLASE" in the last 8760 hours. No results for input(s): "AMMONIA" in the last 8760 hours. CBC: No results for input(s): "WBC", "NEUTROABS", "HGB", "HCT", "MCV", "PLT" in the last 8760 hours. Lipid Panel: No results for input(s): "CHOL", "HDL", "LDLCALC", "TRIG", "CHOLHDL", "LDLDIRECT" in the last 8760 hours. Lab Results  Component Value Date   HGBA1C 6.6 (H) 06/02/2020    Procedures since last visit: No results found.  Assessment/Plan  Diabetes mellitus type 2 in obese (HCC) akes Metformin.   TSH 2.93 11/24/20, Hgb a1c 6.6 06/02/20, need update Hgb a1c  Gout uric acid 4.9 01/01/19, on Allopurinol  Anxiety  Her mood is stable, on Alprazolam 0.79m bid prn, Prozac CT 06/23/21 unremarkable.   Chronic GERD stable on Nexium, Hgb 11.8 11/24/20, update CBC/diff  Essential hypertension Blood pressure is controlled,  takes Beets capsule,  Ziac 10/6.25mg qd. Update CMP/eGFR  Dyslipidemia 215 11/24/20, takes Omega 3, allergic to statin. update lipid panel.   DDD (degenerative disc disease), cervical cervical and lumbar spine: s/p injs, pain is controlled. Check Vit D level.   CKD (chronic kidney disease) stage 3, GFR 30-59 ml/min (HCC) Bun/creat 26/1.07 eGFR 52 11/24/20, update CMP/eGFR  Anemia due to chronic kidney disease Hgb 11.8 11/24/20. Update CBC/diff, Vit B12   Labs/tests ordered:   CBC/diff, CMP/eGFR, TSH, Hgb a1c, lipid panel, uric acid, Vit B12, Vit D  Next appt:  1 year

## 2021-11-25 NOTE — Assessment & Plan Note (Signed)
215 11/24/20, takes Omega 3, allergic to statin. update lipid panel.

## 2021-11-25 NOTE — Assessment & Plan Note (Addendum)
cervical and lumbar spine: s/p injs, pain is controlled. Check Vit D level.

## 2021-11-25 NOTE — Assessment & Plan Note (Signed)
stable on Nexium, Hgb 11.8 11/24/20, update CBC/diff

## 2021-11-25 NOTE — Assessment & Plan Note (Signed)
uric acid 4.9 01/01/19, on Allopurinol

## 2021-11-25 NOTE — Assessment & Plan Note (Signed)
Bun/creat 26/1.07 eGFR 52 11/24/20, update CMP/eGFR

## 2021-11-25 NOTE — Assessment & Plan Note (Signed)
akes Metformin.  TSH 2.93 11/24/20, Hgb a1c 6.6 06/02/20, need update Hgb a1c

## 2021-11-25 NOTE — Assessment & Plan Note (Signed)
Blood pressure is controlled,  takes Beets capsule,  Ziac 10/6.25mg qd. Update CMP/eGFR

## 2021-11-25 NOTE — Assessment & Plan Note (Addendum)
Hgb 11.8 11/24/20. Update CBC/diff, Vit B12

## 2021-11-26 ENCOUNTER — Encounter: Payer: Self-pay | Admitting: Nurse Practitioner

## 2021-11-30 ENCOUNTER — Other Ambulatory Visit: Payer: Medicare PPO

## 2021-11-30 DIAGNOSIS — M5030 Other cervical disc degeneration, unspecified cervical region: Secondary | ICD-10-CM | POA: Diagnosis not present

## 2021-11-30 DIAGNOSIS — F419 Anxiety disorder, unspecified: Secondary | ICD-10-CM

## 2021-11-30 DIAGNOSIS — N1831 Chronic kidney disease, stage 3a: Secondary | ICD-10-CM | POA: Diagnosis not present

## 2021-11-30 DIAGNOSIS — M1A00X0 Idiopathic chronic gout, unspecified site, without tophus (tophi): Secondary | ICD-10-CM | POA: Diagnosis not present

## 2021-11-30 DIAGNOSIS — D631 Anemia in chronic kidney disease: Secondary | ICD-10-CM

## 2021-11-30 DIAGNOSIS — E785 Hyperlipidemia, unspecified: Secondary | ICD-10-CM

## 2021-11-30 DIAGNOSIS — E1169 Type 2 diabetes mellitus with other specified complication: Secondary | ICD-10-CM | POA: Diagnosis not present

## 2021-11-30 DIAGNOSIS — N183 Chronic kidney disease, stage 3 unspecified: Secondary | ICD-10-CM

## 2021-11-30 DIAGNOSIS — E669 Obesity, unspecified: Secondary | ICD-10-CM

## 2021-12-05 LAB — COMPLETE METABOLIC PANEL WITH GFR
AG Ratio: 1.8 (calc) (ref 1.0–2.5)
ALT: 12 U/L (ref 6–29)
AST: 12 U/L (ref 10–35)
Albumin: 4.1 g/dL (ref 3.6–5.1)
Alkaline phosphatase (APISO): 112 U/L (ref 37–153)
BUN/Creatinine Ratio: 25 (calc) — ABNORMAL HIGH (ref 6–22)
BUN: 30 mg/dL — ABNORMAL HIGH (ref 7–25)
CO2: 27 mmol/L (ref 20–32)
Calcium: 9.2 mg/dL (ref 8.6–10.4)
Chloride: 103 mmol/L (ref 98–110)
Creat: 1.18 mg/dL — ABNORMAL HIGH (ref 0.60–0.95)
Globulin: 2.3 g/dL (calc) (ref 1.9–3.7)
Glucose, Bld: 121 mg/dL — ABNORMAL HIGH (ref 65–99)
Potassium: 4.2 mmol/L (ref 3.5–5.3)
Sodium: 137 mmol/L (ref 135–146)
Total Bilirubin: 0.4 mg/dL (ref 0.2–1.2)
Total Protein: 6.4 g/dL (ref 6.1–8.1)
eGFR: 46 mL/min/{1.73_m2} — ABNORMAL LOW (ref >=60)

## 2021-12-05 LAB — CBC WITH DIFFERENTIAL/PLATELET
Absolute Monocytes: 602 cells/uL (ref 200–950)
Basophils Absolute: 60 cells/uL (ref 0–200)
Basophils Relative: 0.7 %
Eosinophils Absolute: 413 cells/uL (ref 15–500)
Eosinophils Relative: 4.8 %
HCT: 34.4 % — ABNORMAL LOW (ref 35.0–45.0)
Hemoglobin: 11.5 g/dL — ABNORMAL LOW (ref 11.7–15.5)
Lymphs Abs: 2296 cells/uL (ref 850–3900)
MCH: 30.3 pg (ref 27.0–33.0)
MCHC: 33.4 g/dL (ref 32.0–36.0)
MCV: 90.8 fL (ref 80.0–100.0)
MPV: 10.3 fL (ref 7.5–12.5)
Monocytes Relative: 7 %
Neutro Abs: 5229 cells/uL (ref 1500–7800)
Neutrophils Relative %: 60.8 %
Platelets: 266 10*3/uL (ref 140–400)
RBC: 3.79 10*6/uL — ABNORMAL LOW (ref 3.80–5.10)
RDW: 14.3 % (ref 11.0–15.0)
Total Lymphocyte: 26.7 %
WBC: 8.6 10*3/uL (ref 3.8–10.8)

## 2021-12-05 LAB — VITAMIN D 1,25 DIHYDROXY
Vitamin D 1, 25 (OH)2 Total: 37 pg/mL (ref 18–72)
Vitamin D2 1, 25 (OH)2: <8 pg/mL
Vitamin D3 1, 25 (OH)2: 37 pg/mL

## 2021-12-05 LAB — HEMOGLOBIN A1C
Hgb A1c MFr Bld: 6.8 % of total Hgb — ABNORMAL HIGH (ref <5.7)
Mean Plasma Glucose: 148 mg/dL
eAG (mmol/L): 8.2 mmol/L

## 2021-12-05 LAB — LIPID PANEL
Cholesterol: 305 mg/dL — ABNORMAL HIGH (ref <200)
HDL: 42 mg/dL — ABNORMAL LOW (ref >=50)
Non-HDL Cholesterol (Calc): 263 mg/dL (calc) — ABNORMAL HIGH (ref <130)
Total CHOL/HDL Ratio: 7.3 (calc) — ABNORMAL HIGH (ref <5.0)
Triglycerides: 413 mg/dL — ABNORMAL HIGH (ref <150)

## 2021-12-05 LAB — URIC ACID: Uric Acid, Serum: 4.7 mg/dL (ref 2.5–7.0)

## 2021-12-05 LAB — VITAMIN B12: Vitamin B-12: 307 pg/mL (ref 200–1100)

## 2021-12-05 LAB — TSH: TSH: 1.99 mIU/L (ref 0.40–4.50)

## 2022-01-10 ENCOUNTER — Other Ambulatory Visit: Payer: Self-pay | Admitting: Nurse Practitioner

## 2022-01-10 NOTE — Telephone Encounter (Signed)
Patient has requested refill medication Xanax. Patient last refill dated 04/05/2021. Patient doesn't have Non Opioid Contract on file. Medication pend and sent to PCP Mast, Man X, NP for approval.

## 2022-01-26 ENCOUNTER — Other Ambulatory Visit: Payer: Self-pay | Admitting: Nurse Practitioner

## 2022-03-30 ENCOUNTER — Encounter: Payer: Self-pay | Admitting: Nurse Practitioner

## 2022-03-31 ENCOUNTER — Encounter: Payer: Medicare PPO | Admitting: Nurse Practitioner

## 2022-03-31 VITALS — Ht 62.0 in

## 2022-03-31 NOTE — Assessment & Plan Note (Signed)
cervical and lumbar spine: s/p injs, pain is controlled. Vit D 37 11/30/21

## 2022-03-31 NOTE — Assessment & Plan Note (Signed)
stable on Nexium, Hgb 11.5 11/30/21

## 2022-03-31 NOTE — Assessment & Plan Note (Signed)
takes Metformin, Hgb A1c 6.8 11/30/21, update Hgb A1c, CMP/eGFR, CBC/diff

## 2022-03-31 NOTE — Progress Notes (Signed)
This encounter was created in error - please disregard.

## 2022-03-31 NOTE — Assessment & Plan Note (Signed)
Blood pressure is controlled,  takes Beets capsule,  Ziac 10/6.'25mg'$  qd.

## 2022-03-31 NOTE — Assessment & Plan Note (Signed)
Her mood is stable, on Alprazolam 0.'25mg'$  bid prn, Prozac CT 06/23/21 unremarkable. TSH 1.99 11/30/21

## 2022-03-31 NOTE — Assessment & Plan Note (Signed)
No acute gout flare ups, uric acid 4.9 01/01/19, on Allopurinol

## 2022-03-31 NOTE — Assessment & Plan Note (Signed)
Vit B12 307Hgb 11.5 11/30/21

## 2022-03-31 NOTE — Assessment & Plan Note (Signed)
takes Omega 3, allergic to statin. LDL not calculated, repeat lipid panel

## 2022-03-31 NOTE — Assessment & Plan Note (Signed)
Bun/creat 30/1.18 11/30/21

## 2022-04-07 ENCOUNTER — Encounter: Payer: Self-pay | Admitting: Nurse Practitioner

## 2022-04-07 ENCOUNTER — Non-Acute Institutional Stay: Payer: Medicare PPO | Admitting: Nurse Practitioner

## 2022-04-07 VITALS — BP 140/80 | HR 65 | Temp 97.3°F | Resp 18 | Ht 62.0 in | Wt 201.0 lb

## 2022-04-07 DIAGNOSIS — N183 Chronic kidney disease, stage 3 unspecified: Secondary | ICD-10-CM | POA: Diagnosis not present

## 2022-04-07 DIAGNOSIS — I10 Essential (primary) hypertension: Secondary | ICD-10-CM | POA: Diagnosis not present

## 2022-04-07 DIAGNOSIS — E785 Hyperlipidemia, unspecified: Secondary | ICD-10-CM

## 2022-04-07 DIAGNOSIS — M5030 Other cervical disc degeneration, unspecified cervical region: Secondary | ICD-10-CM

## 2022-04-07 DIAGNOSIS — F419 Anxiety disorder, unspecified: Secondary | ICD-10-CM

## 2022-04-07 DIAGNOSIS — E1169 Type 2 diabetes mellitus with other specified complication: Secondary | ICD-10-CM | POA: Diagnosis not present

## 2022-04-07 DIAGNOSIS — M1A00X0 Idiopathic chronic gout, unspecified site, without tophus (tophi): Secondary | ICD-10-CM

## 2022-04-07 DIAGNOSIS — D631 Anemia in chronic kidney disease: Secondary | ICD-10-CM

## 2022-04-07 DIAGNOSIS — K219 Gastro-esophageal reflux disease without esophagitis: Secondary | ICD-10-CM

## 2022-04-07 DIAGNOSIS — N1831 Chronic kidney disease, stage 3a: Secondary | ICD-10-CM

## 2022-04-07 DIAGNOSIS — E669 Obesity, unspecified: Secondary | ICD-10-CM

## 2022-04-07 DIAGNOSIS — R5381 Other malaise: Secondary | ICD-10-CM

## 2022-04-07 DIAGNOSIS — R5383 Other fatigue: Secondary | ICD-10-CM

## 2022-04-07 NOTE — Assessment & Plan Note (Signed)
Blood pressure is controlled,  takes Beets capsule,  Ziac 10/6.'25mg'$  qd.

## 2022-04-07 NOTE — Assessment & Plan Note (Signed)
Vit B12 307Hgb 11.5 11/30/21

## 2022-04-07 NOTE — Assessment & Plan Note (Signed)
Not feeling well, occasionally dizziness when turning in bed, denied headache, change of vision, or focal weakness, c/o occasional intermittent left lower rib cage region pain, no associated with deep breathing, body movement, diaphoresis, sense of impending doom, or SOB. Update labs, may consider CT head, X-ray chest if no better.

## 2022-04-07 NOTE — Assessment & Plan Note (Signed)
cervical and lumbar spine: s/p injs, pain is controlled. Vit D 37 11/30/21

## 2022-04-07 NOTE — Assessment & Plan Note (Signed)
stable on Nexium, Hgb 11.5 11/30/21

## 2022-04-07 NOTE — Assessment & Plan Note (Signed)
takes Omega 3, allergic to statin. LDL not calculated, repeat lipid panel

## 2022-04-07 NOTE — Assessment & Plan Note (Signed)
takes Metformin, Hgb A1c 6.8 11/30/21, update Hgb A1c, CMP/eGFR, CBC/diff, UACR

## 2022-04-07 NOTE — Progress Notes (Signed)
Location:   clinic [place] [facility] Number: [place] 1405-P Place of Service:  Clinic (12) Provider: Marlana Latus NP  Code Status: DNR Goals of Care:     04/07/2022    8:28 AM  Advanced Directives  Does Patient Have a Medical Advance Directive? No  Would patient like information on creating a medical advance directive? No - Patient declined     Chief Complaint  Patient presents with   Medical Management of Chronic Issues    Patient is here for a follow up for chronic conditions    Immunizations    Patient is due for pneumonia vaccine   Quality Metric Gaps    Patient is due for AWV, mammogram, foot exam,and urine ACR    HPI: Patient is a 82 y.o. female seen today for medical management of chronic diseases.    Not feeling well, occasionally dizziness when turning in bed, denied headache, change of vision, or focal weakness, c/o occasional intermittent left lower rib cage region pain, no associated with deep breathing, body movement, diaphoresis, sense of impending doom, or SOB T2DM, takes Metformin, Hgb A1c 6.8 11/30/21             No acute gout flare ups, uric acid 4.9 01/01/19, on Allopurinol             Her mood is stable, on Alprazolam 0.'25mg'$  bid prn, Prozac CT 06/23/21 unremarkable. TSH 1.99 11/30/21             GERD, stable on Nexium, Hgb 11.5 11/30/21             HTN, takes Beets capsule,  Ziac 10/6.'25mg'$  qd.              LDL, takes Omega 3, allergic to statin. LDL not calculated             DDD cervical and lumbar spine: s/p injs, pain is controlled. Vit D 37 11/30/21             CKD Bun/creat 30/1.18 11/30/21             Anemia, Vit B12 307Hgb 11.5 11/30/21 Past Medical History:  Diagnosis Date   Allergy    Anemia    Anxiety    Arthritis    with gout   Carotid bruit    right   Cataract    bil cataracts removed   Chest pain    Chronic kidney disease    Colon polyps    Depression    Diabetes type 2, uncontrolled    Diverticulosis    Duodenitis    Exogenous  obesity    Gastric ulcer    GERD (gastroesophageal reflux disease)    Hyperlipidemia    Hypertension    Metabolic syndrome    Osteopenia    Seasonal allergies    seasonal   Tubular adenoma of colon     Past Surgical History:  Procedure Laterality Date   COLONOSCOPY     11/90,11/91,1994,2000,2010   DILATION AND CURETTAGE OF UTERUS     DILATION AND CURETTAGE OF UTERUS  1997   FOOT SURGERY     x 2   HAND SURGERY Right    Carpel Tunnel   POLYPECTOMY     TUBAL LIGATION     uterine polyps removed     x2    Allergies  Allergen Reactions   Crestor [Rosuvastatin Calcium]     Myalgias    Elemental Sulfur     Made  infection worse   Gemfibrozil Itching   Iodine     Skin rash   Lipitor [Atorvastatin Calcium]     Myalgias    Lovastatin     myalgias   Sulfonamide Derivatives     REACTION: n\T\v   Zetia [Ezetimibe]     Cough, dry    Allergies as of 04/07/2022       Reactions   Crestor [rosuvastatin Calcium]    Myalgias   Elemental Sulfur    Made infection worse   Gemfibrozil Itching   Iodine    Skin rash   Lipitor [atorvastatin Calcium]    Myalgias   Lovastatin    myalgias   Sulfonamide Derivatives    REACTION: n\T\v   Zetia [ezetimibe]    Cough, dry        Medication List        Accurate as of April 07, 2022  4:06 PM. If you have any questions, ask your nurse or doctor.          Accu-Chek FastClix Lancets Misc USE AS DIRECTED   Accu-Chek SmartView test strip Generic drug: glucose blood CHECK BLOOD SUGAR 3 TIMES DAILY AS DIRECTED.   acetaminophen 500 MG tablet Commonly known as: TYLENOL Take 500 mg by mouth every 6 (six) hours as needed.   allopurinol 100 MG tablet Commonly known as: ZYLOPRIM TAKE 2 TABLETS BY MOUTH ONCE DAILY.   ALPRAZolam 0.25 MG tablet Commonly known as: XANAX TAKE ONE TABLET BY MOUTH TWICE DAILY AS NEEDED   bisoprolol-hydrochlorothiazide 10-6.25 MG tablet Commonly known as: ZIAC TAKE ONE TABLET BY MOUTH  DAILY   CRANBERRY PO Take by mouth.   esomeprazole 40 MG capsule Commonly known as: NEXIUM TAKE 1 CAPSULE TWICE DAILY BEFORE MEALS   Fish Oil 1000 MG Caps Take 1 capsule by mouth daily.   FLUoxetine 40 MG capsule Commonly known as: PROZAC TAKE (1) CAPSULE DAILY.   metFORMIN 500 MG tablet Commonly known as: GLUCOPHAGE TAKE 1 TABLET ONCE DAILY.   NON FORMULARY Beet Root 1 capsule daily   PRESERVISION AREDS PO Take 1 tablet by mouth in the morning and at bedtime.   Vitamin D3 50 MCG (2000 UT) capsule Take 2,000 Units by mouth daily.        Review of Systems:  Review of Systems  Constitutional:  Negative for fatigue, fever and unexpected weight change.  HENT:  Positive for hearing loss. Negative for congestion and voice change.   Respiratory:  Negative for cough, chest tightness, shortness of breath and wheezing.   Cardiovascular:  Negative for leg swelling.  Gastrointestinal:  Negative for abdominal pain and constipation.  Genitourinary:  Positive for frequency. Negative for difficulty urinating, dysuria, hematuria and urgency.       1-2x/night  Musculoskeletal:  Positive for arthralgias, back pain and gait problem.  Skin:  Negative for color change.  Neurological:  Positive for light-headedness. Negative for facial asymmetry and weakness.  Psychiatric/Behavioral:  Negative for behavioral problems and sleep disturbance. The patient is not nervous/anxious.     Health Maintenance  Topic Date Due   Diabetic kidney evaluation - Urine ACR  10/13/2018   MAMMOGRAM  11/20/2020   COVID-19 Vaccine (7 - 2023-24 season) 03/17/2022   Medicare Annual Wellness (AWV)  04/29/2022   HEMOGLOBIN A1C  05/31/2022   Diabetic kidney evaluation - eGFR measurement  12/01/2022   FOOT EXAM  04/08/2023   DTaP/Tdap/Td (2 - Td or Tdap) 03/02/2025   INFLUENZA VACCINE  Completed   DEXA SCAN  Completed   Zoster Vaccines- Shingrix  Completed   HPV VACCINES  Aged Out   Pneumonia Vaccine 66+  Years old  Discontinued   OPHTHALMOLOGY EXAM  Discontinued    Physical Exam: Vitals:   04/07/22 1312  BP: (!) 140/80  Pulse: 65  Resp: 18  Temp: (!) 97.3 F (36.3 C)  SpO2: 97%  Weight: 201 lb (91.2 kg)  Height: '5\' 2"'$  (1.575 m)   Body mass index is 36.76 kg/m. Physical Exam Constitutional:      Appearance: Normal appearance.  HENT:     Head: Normocephalic.     Comments:  There is indented line through forehead extended to the left parietal region, no apparent scar,     Nose: Nose normal.     Mouth/Throat:     Mouth: Mucous membranes are moist.  Eyes:     Extraocular Movements: Extraocular movements intact.     Conjunctiva/sclera: Conjunctivae normal.     Pupils: Pupils are equal, round, and reactive to light.  Cardiovascular:     Rate and Rhythm: Normal rate and regular rhythm.     Heart sounds: No murmur heard.    Comments: Weak DP pulse left>>left Pulmonary:     Effort: Pulmonary effort is normal.     Breath sounds: No wheezing, rhonchi or rales.  Chest:     Chest wall: No tenderness.  Abdominal:     General: Bowel sounds are normal.     Palpations: Abdomen is soft.     Tenderness: There is no abdominal tenderness.  Musculoskeletal:     Cervical back: Normal range of motion and neck supple.     Right lower leg: No edema.     Left lower leg: No edema.     Comments: Neck pain, lower back pain radiating to the left buttock/thigh/knee. Left 2nd hammer toe.   Skin:    General: Skin is warm and dry.  Neurological:     General: No focal deficit present.     Mental Status: She is alert and oriented to person, place, and time. Mental status is at baseline.     Cranial Nerves: No cranial nerve deficit.     Sensory: No sensory deficit.     Motor: No weakness.     Coordination: Coordination normal.     Gait: Gait normal.  Psychiatric:        Mood and Affect: Mood normal.        Behavior: Behavior normal.        Thought Content: Thought content normal.         Judgment: Judgment normal.     Labs reviewed: Basic Metabolic Panel: Recent Labs    11/30/21 0705  NA 137  K 4.2  CL 103  CO2 27  GLUCOSE 121*  BUN 30*  CREATININE 1.18*  CALCIUM 9.2  TSH 1.99   Liver Function Tests: Recent Labs    11/30/21 0705  AST 12  ALT 12  BILITOT 0.4  PROT 6.4   No results for input(s): "LIPASE", "AMYLASE" in the last 8760 hours. No results for input(s): "AMMONIA" in the last 8760 hours. CBC: Recent Labs    11/30/21 0705  WBC 8.6  NEUTROABS 5,229  HGB 11.5*  HCT 34.4*  MCV 90.8  PLT 266   Lipid Panel: Recent Labs    11/30/21 0705  CHOL 305*  HDL 42*  TRIG 413*  CHOLHDL 7.3*   Lab Results  Component Value Date   HGBA1C 6.8 (H) 11/30/2021  Procedures since last visit: No results found.  Assessment/Plan  Diabetes mellitus type 2 in obese [facility]) takes Metformin, Hgb A1c 6.8 11/30/21, update Hgb A1c, CMP/eGFR, CBC/diff, UACR  Gout No acute gout flare ups, uric acid 4.9 01/01/19, on Allopurinol   Anxiety Her mood is stable, on Alprazolam 0.'25mg'$  bid prn, Prozac CT 06/23/21 unremarkable. TSH 1.99 11/30/21    Chronic GERD stable on Nexium, Hgb 11.5 11/30/21   Essential hypertension Blood pressure is controlled,  takes Beets capsule,  Ziac 10/6.'25mg'$  qd.     Dyslipidemia  takes Omega 3, allergic to statin. LDL not calculated, repeat lipid panel   DDD (degenerative disc disease), cervical cervical and lumbar spine: s/p injs, pain is controlled. Vit D 37 11/30/21   CKD (chronic kidney disease) stage 3, GFR 30-59 ml/min (HCC) Bun/creat 30/1.18 11/30/21   Anemia due to chronic kidney disease Vit B12 307Hgb 11.5 11/30/21    Malaise and fatigue Not feeling well, occasionally dizziness when turning in bed, denied headache, change of vision, or focal weakness, c/o occasional intermittent left lower rib cage region pain, no associated with deep breathing, body movement, diaphoresis, sense of impending doom, or SOB. Update labs,  may consider CT head, X-ray chest if no better.    Labs/tests ordered:  CBC/diff, CMP/eGFR, lipid panel, UACR, Hgb A1c one week.   Next appt: 2 weeks.  AWV 3 weeks

## 2022-04-07 NOTE — Assessment & Plan Note (Signed)
Her mood is stable, on Alprazolam 0.'25mg'$  bid prn, Prozac CT 06/23/21 unremarkable. TSH 1.99 11/30/21

## 2022-04-07 NOTE — Assessment & Plan Note (Signed)
No acute gout flare ups, uric acid 4.9 01/01/19, on Allopurinol

## 2022-04-07 NOTE — Assessment & Plan Note (Signed)
Bun/creat 30/1.18 11/30/21

## 2022-04-11 ENCOUNTER — Other Ambulatory Visit: Payer: Self-pay | Admitting: Nurse Practitioner

## 2022-04-12 ENCOUNTER — Other Ambulatory Visit: Payer: Medicare PPO

## 2022-04-12 DIAGNOSIS — N183 Chronic kidney disease, stage 3 unspecified: Secondary | ICD-10-CM | POA: Diagnosis not present

## 2022-04-12 DIAGNOSIS — D631 Anemia in chronic kidney disease: Secondary | ICD-10-CM | POA: Diagnosis not present

## 2022-04-12 DIAGNOSIS — E785 Hyperlipidemia, unspecified: Secondary | ICD-10-CM | POA: Diagnosis not present

## 2022-04-12 DIAGNOSIS — N1831 Chronic kidney disease, stage 3a: Secondary | ICD-10-CM | POA: Diagnosis not present

## 2022-04-12 DIAGNOSIS — E1169 Type 2 diabetes mellitus with other specified complication: Secondary | ICD-10-CM

## 2022-04-12 DIAGNOSIS — E669 Obesity, unspecified: Secondary | ICD-10-CM | POA: Diagnosis not present

## 2022-04-13 ENCOUNTER — Encounter: Payer: Self-pay | Admitting: Nurse Practitioner

## 2022-04-13 LAB — COMPLETE METABOLIC PANEL WITH GFR
AG Ratio: 1.8 (calc) (ref 1.0–2.5)
ALT: 12 U/L (ref 6–29)
AST: 13 U/L (ref 10–35)
Albumin: 4.1 g/dL (ref 3.6–5.1)
Alkaline phosphatase (APISO): 89 U/L (ref 37–153)
BUN/Creatinine Ratio: 26 (calc) — ABNORMAL HIGH (ref 6–22)
BUN: 29 mg/dL — ABNORMAL HIGH (ref 7–25)
CO2: 25 mmol/L (ref 20–32)
Calcium: 9.2 mg/dL (ref 8.6–10.4)
Chloride: 105 mmol/L (ref 98–110)
Creat: 1.1 mg/dL — ABNORMAL HIGH (ref 0.60–0.95)
Globulin: 2.3 g/dL (calc) (ref 1.9–3.7)
Glucose, Bld: 114 mg/dL — ABNORMAL HIGH (ref 65–99)
Potassium: 4.6 mmol/L (ref 3.5–5.3)
Sodium: 139 mmol/L (ref 135–146)
Total Bilirubin: 0.5 mg/dL (ref 0.2–1.2)
Total Protein: 6.4 g/dL (ref 6.1–8.1)
eGFR: 50 mL/min/{1.73_m2} — ABNORMAL LOW (ref >=60)

## 2022-04-13 LAB — CBC WITH DIFFERENTIAL/PLATELET
Absolute Monocytes: 595 cells/uL (ref 200–950)
Basophils Absolute: 60 cells/uL (ref 0–200)
Basophils Relative: 0.7 %
Eosinophils Absolute: 340 cells/uL (ref 15–500)
Eosinophils Relative: 4 %
HCT: 34.3 % — ABNORMAL LOW (ref 35.0–45.0)
Hemoglobin: 11.8 g/dL (ref 11.7–15.5)
Lymphs Abs: 2261 cells/uL (ref 850–3900)
MCH: 31.4 pg (ref 27.0–33.0)
MCHC: 34.4 g/dL (ref 32.0–36.0)
MCV: 91.2 fL (ref 80.0–100.0)
MPV: 11 fL (ref 7.5–12.5)
Monocytes Relative: 7 %
Neutro Abs: 5245 cells/uL (ref 1500–7800)
Neutrophils Relative %: 61.7 %
Platelets: 264 10*3/uL (ref 140–400)
RBC: 3.76 10*6/uL — ABNORMAL LOW (ref 3.80–5.10)
RDW: 13.7 % (ref 11.0–15.0)
Total Lymphocyte: 26.6 %
WBC: 8.5 10*3/uL (ref 3.8–10.8)

## 2022-04-13 LAB — HEMOGLOBIN A1C
Hgb A1c MFr Bld: 7.2 % of total Hgb — ABNORMAL HIGH (ref <5.7)
Mean Plasma Glucose: 160 mg/dL
eAG (mmol/L): 8.9 mmol/L

## 2022-04-13 LAB — LIPID PANEL
Cholesterol: 321 mg/dL — ABNORMAL HIGH (ref <200)
HDL: 47 mg/dL — ABNORMAL LOW (ref >=50)
LDL Cholesterol (Calc): 209 mg/dL (calc) — ABNORMAL HIGH
Non-HDL Cholesterol (Calc): 274 mg/dL (calc) — ABNORMAL HIGH (ref <130)
Total CHOL/HDL Ratio: 6.8 (calc) — ABNORMAL HIGH (ref <5.0)
Triglycerides: 381 mg/dL — ABNORMAL HIGH (ref <150)

## 2022-04-14 ENCOUNTER — Non-Acute Institutional Stay: Payer: Medicare PPO | Admitting: Nurse Practitioner

## 2022-04-14 ENCOUNTER — Encounter: Payer: Self-pay | Admitting: Nurse Practitioner

## 2022-04-14 VITALS — BP 138/80 | HR 64 | Temp 97.5°F | Resp 18 | Ht 62.0 in | Wt 201.0 lb

## 2022-04-14 DIAGNOSIS — R5381 Other malaise: Secondary | ICD-10-CM | POA: Diagnosis not present

## 2022-04-14 DIAGNOSIS — R5383 Other fatigue: Secondary | ICD-10-CM

## 2022-04-14 DIAGNOSIS — E785 Hyperlipidemia, unspecified: Secondary | ICD-10-CM

## 2022-04-14 DIAGNOSIS — K219 Gastro-esophageal reflux disease without esophagitis: Secondary | ICD-10-CM

## 2022-04-14 DIAGNOSIS — E1169 Type 2 diabetes mellitus with other specified complication: Secondary | ICD-10-CM

## 2022-04-14 DIAGNOSIS — E669 Obesity, unspecified: Secondary | ICD-10-CM

## 2022-04-14 DIAGNOSIS — F419 Anxiety disorder, unspecified: Secondary | ICD-10-CM | POA: Diagnosis not present

## 2022-04-14 DIAGNOSIS — D631 Anemia in chronic kidney disease: Secondary | ICD-10-CM

## 2022-04-14 DIAGNOSIS — N1831 Chronic kidney disease, stage 3a: Secondary | ICD-10-CM

## 2022-04-14 DIAGNOSIS — M1A00X0 Idiopathic chronic gout, unspecified site, without tophus (tophi): Secondary | ICD-10-CM

## 2022-04-14 DIAGNOSIS — N183 Chronic kidney disease, stage 3 unspecified: Secondary | ICD-10-CM | POA: Diagnosis not present

## 2022-04-14 DIAGNOSIS — M5030 Other cervical disc degeneration, unspecified cervical region: Secondary | ICD-10-CM

## 2022-04-14 DIAGNOSIS — I10 Essential (primary) hypertension: Secondary | ICD-10-CM

## 2022-04-14 MED ORDER — VITAMIN B-12 1000 MCG PO TABS: 1000.0000 ug | ORAL_TABLET | Freq: Every day | ORAL | 3 refills | Status: DC

## 2022-04-14 MED ORDER — GLIPIZIDE 5 MG PO TABS: 2.5000 mg | ORAL_TABLET | Freq: Two times a day (BID) | ORAL | 3 refills | Status: DC

## 2022-04-14 NOTE — Assessment & Plan Note (Signed)
Feeling down,  on Alprazolam 0.'25mg'$  bid prn, will increase Prozac '50mg'$  qd, CT 06/23/21 unremarkable. TSH 1.99 11/30/21

## 2022-04-14 NOTE — Assessment & Plan Note (Addendum)
Persisted, labs 04/12/22 shoed LDL 209, Hgb A1c increased to 7.2, Vit B12 309 11/2021, the patient desires to dc Metformin, try Glipizide 2.'5mg'$  to better managing blood sugar, adding Vit B12 1063mg qd, continue life style modification to managed cholesterol since the patient is allergic to statin, may consider increase Prozac to '50mg'$  qd in the future.

## 2022-04-14 NOTE — Assessment & Plan Note (Signed)
takes Omega 3, allergic to statin. LDL 209 04/12/22

## 2022-04-14 NOTE — Progress Notes (Addendum)
Location:   Clinic [facility]   Place of Service:  Clinic (12) Provider: Marlana Latus NP  Code Status: DNR Goals of Care:     04/13/2022    9:01 AM  Advanced Directives  Does Patient Have a Medical Advance Directive? No  Would patient like information on creating a medical advance directive? No - Patient declined     Chief Complaint  Patient presents with   Medical Management of Chronic Issues    Patient is being seen for a F/U  and lab review   Quality Metric Gaps    Patient is due for mammogram    HPI: Patient is a 82 y.o. female seen today for medical management of chronic diseases.      Not feeling well, occasionally dizziness when turning in bed, denied headache, change of vision, or focal weakness, c/o occasional intermittent left lower rib cage region pain, no associated with deep breathing, body movement, diaphoresis, sense of impending doom, or SOB T2DM, wants to dc Metformin-bloated, starts Glipizide, Hgb A1c 6.8 11/30/21<<7.2 04/12/22             No acute gout flare ups, uric acid 4.9 01/01/19, on Allopurinol             Depression,  on Alprazolam 0.'25mg'$  bid prn, on Prozac CT 06/23/21 unremarkable. TSH 1.99 11/30/21             GERD, stable on Nexium,              HTN, takes Beets capsule,  Ziac 10/6.'25mg'$  qd.              LDL, takes Omega 3, allergic to statin. LDL 209 04/12/22             DDD cervical and lumbar spine: s/p injs, pain is controlled. Vit D 37 11/30/21             CKD Bun/creat 29/1.10 04/12/22             Anemia, Vit B12 307 12/01/22, adding Vit B12 supplement,  Hgb 11.8 04/12/22  Past Medical History:  Diagnosis Date   Allergy    Anemia    Anxiety    Arthritis    with gout   Carotid bruit    right   Cataract    bil cataracts removed   Chest pain    Chronic kidney disease    Colon polyps    Depression    Diabetes type 2, uncontrolled    Diverticulosis    Duodenitis    Exogenous obesity    Gastric ulcer    GERD (gastroesophageal reflux disease)     Hyperlipidemia    Hypertension    Metabolic syndrome    Osteopenia    Seasonal allergies    seasonal   Tubular adenoma of colon     Past Surgical History:  Procedure Laterality Date   COLONOSCOPY     11/90,11/91,1994,2000,2010   DILATION AND CURETTAGE OF UTERUS     DILATION AND CURETTAGE OF UTERUS  1997   FOOT SURGERY     x 2   HAND SURGERY Right    Carpel Tunnel   POLYPECTOMY     TUBAL LIGATION     uterine polyps removed     x2    Allergies  Allergen Reactions   Crestor [Rosuvastatin Calcium]     Myalgias    Elemental Sulfur     Made infection worse   Gemfibrozil Itching   Iodine  Skin rash   Lipitor [Atorvastatin Calcium]     Myalgias    Lovastatin     myalgias   Sulfonamide Derivatives     REACTION: n\T\v   Zetia [Ezetimibe]     Cough, dry    Allergies as of 04/14/2022       Reactions   Crestor [rosuvastatin Calcium]    Myalgias   Elemental Sulfur    Made infection worse   Gemfibrozil Itching   Iodine    Skin rash   Lipitor [atorvastatin Calcium]    Myalgias   Lovastatin    myalgias   Sulfonamide Derivatives    REACTION: n\T\v   Zetia [ezetimibe]    Cough, dry        Medication List        Accurate as of April 14, 2022 11:59 PM. If you have any questions, ask your nurse or doctor.          STOP taking these medications    metFORMIN 500 MG tablet Commonly known as: GLUCOPHAGE Stopped by: Tonesha Tsou X Iliani Vejar, NP       TAKE these medications    Accu-Chek FastClix Lancets Misc USE AS DIRECTED   Accu-Chek SmartView test strip Generic drug: glucose blood CHECK BLOOD SUGAR 3 TIMES DAILY AS DIRECTED.   acetaminophen 500 MG tablet Commonly known as: TYLENOL Take 500 mg by mouth every 6 (six) hours as needed.   allopurinol 100 MG tablet Commonly known as: ZYLOPRIM TAKE 2 TABLETS BY MOUTH ONCE DAILY.   ALPRAZolam 0.25 MG tablet Commonly known as: XANAX TAKE ONE TABLET BY MOUTH TWICE DAILY AS NEEDED    bisoprolol-hydrochlorothiazide 10-6.25 MG tablet Commonly known as: ZIAC TAKE ONE TABLET BY MOUTH DAILY   CRANBERRY PO Take by mouth.   cyanocobalamin 1000 MCG tablet Commonly known as: VITAMIN B12 Take 1 tablet (1,000 mcg total) by mouth daily. Started by: Kayce Betty X Landen Knoedler, NP   esomeprazole 40 MG capsule Commonly known as: NEXIUM TAKE 1 CAPSULE TWICE DAILY BEFORE MEALS   Fish Oil 1000 MG Caps Take 1 capsule by mouth daily.   FLUoxetine 40 MG capsule Commonly known as: PROZAC TAKE (1) CAPSULE DAILY.   glipiZIDE 5 MG tablet Commonly known as: GLUCOTROL Take 0.5 tablets (2.5 mg total) by mouth 2 (two) times daily before a meal. Started by: Santasia Rew X Duward Allbritton, NP   NON FORMULARY Beet Root 1 capsule daily   PRESERVISION AREDS PO Take 1 tablet by mouth in the morning and at bedtime.   Vitamin D3 50 MCG (2000 UT) capsule Take 2,000 Units by mouth daily.        Review of Systems:  Review of Systems  Constitutional:  Positive for fatigue. Negative for fever and unexpected weight change.  HENT:  Positive for hearing loss. Negative for congestion and voice change.   Respiratory:  Negative for cough, chest tightness, shortness of breath and wheezing.   Cardiovascular:  Negative for leg swelling.  Gastrointestinal:  Negative for abdominal pain and constipation.  Genitourinary:  Positive for frequency. Negative for difficulty urinating, dysuria, hematuria and urgency.       1-2x/night  Musculoskeletal:  Positive for arthralgias, back pain and gait problem.  Skin:  Negative for color change.  Neurological:  Positive for light-headedness. Negative for facial asymmetry and weakness.  Psychiatric/Behavioral:  Negative for behavioral problems and sleep disturbance. The patient is not nervous/anxious.     Health Maintenance  Topic Date Due   MAMMOGRAM  11/20/2020   Medicare Annual Wellness (  AWV)  04/29/2022   COVID-19 Vaccine (7 - 2023-24 season) 06/21/2022 (Originally 03/17/2022)    HEMOGLOBIN A1C  10/11/2022   FOOT EXAM  04/08/2023   Diabetic kidney evaluation - eGFR measurement  04/13/2023   Diabetic kidney evaluation - Urine ACR  04/15/2023   DTaP/Tdap/Td (2 - Td or Tdap) 03/02/2025   INFLUENZA VACCINE  Completed   DEXA SCAN  Completed   Zoster Vaccines- Shingrix  Completed   HPV VACCINES  Aged Out   Pneumonia Vaccine 71+ Years old  Discontinued   OPHTHALMOLOGY EXAM  Discontinued    Physical Exam: Vitals:   04/14/22 1350  BP: 138/80  Pulse: 64  Resp: 18  Temp: (!) 97.5 F (36.4 C)  SpO2: 99%  Weight: 201 lb (91.2 kg)  Height: '5\' 2"'$  (1.575 m)   Body mass index is 36.76 kg/m. Physical Exam Constitutional:      Appearance: Normal appearance.  HENT:     Head: Normocephalic.     Comments:  There is indented line through forehead extended to the left parietal region, no apparent scar,     Nose: Nose normal.     Mouth/Throat:     Mouth: Mucous membranes are moist.  Eyes:     Extraocular Movements: Extraocular movements intact.     Conjunctiva/sclera: Conjunctivae normal.     Pupils: Pupils are equal, round, and reactive to light.  Cardiovascular:     Rate and Rhythm: Normal rate and regular rhythm.     Heart sounds: No murmur heard.    Comments: Weak DP pulse left>>left Pulmonary:     Effort: Pulmonary effort is normal.     Breath sounds: No wheezing, rhonchi or rales.  Chest:     Chest wall: No tenderness.  Abdominal:     General: Bowel sounds are normal.     Palpations: Abdomen is soft.     Tenderness: There is no abdominal tenderness.  Musculoskeletal:     Cervical back: Normal range of motion and neck supple.     Right lower leg: No edema.     Left lower leg: No edema.     Comments: Neck pain, lower back pain radiating to the left buttock/thigh/knee. Left 2nd hammer toe.   Skin:    General: Skin is warm and dry.  Neurological:     General: No focal deficit present.     Mental Status: She is alert and oriented to person, place, and  time. Mental status is at baseline.     Cranial Nerves: No cranial nerve deficit.     Sensory: No sensory deficit.     Motor: No weakness.     Coordination: Coordination normal.     Gait: Gait normal.  Psychiatric:        Mood and Affect: Mood normal.        Behavior: Behavior normal.        Thought Content: Thought content normal.        Judgment: Judgment normal.     Labs reviewed: Basic Metabolic Panel: Recent Labs    11/30/21 0705 04/12/22 0703  NA 137 139  K 4.2 4.6  CL 103 105  CO2 27 25  GLUCOSE 121* 114*  BUN 30* 29*  CREATININE 1.18* 1.10*  CALCIUM 9.2 9.2  TSH 1.99  --    Liver Function Tests: Recent Labs    11/30/21 0705 04/12/22 0703  AST 12 13  ALT 12 12  BILITOT 0.4 0.5  PROT 6.4 6.4   No  results for input(s): "LIPASE", "AMYLASE" in the last 8760 hours. No results for input(s): "AMMONIA" in the last 8760 hours. CBC: Recent Labs    11/30/21 0705 04/12/22 0703  WBC 8.6 8.5  NEUTROABS 5,229 5,245  HGB 11.5* 11.8  HCT 34.4* 34.3*  MCV 90.8 91.2  PLT 266 264   Lipid Panel: Recent Labs    11/30/21 0705 04/12/22 0703  CHOL 305* 321*  HDL 42* 47*  LDLCALC  --  209*  TRIG 413* 381*  CHOLHDL 7.3* 6.8*   Lab Results  Component Value Date   HGBA1C 7.2 (H) 04/12/2022    Procedures since last visit: No results found.  Assessment/Plan  Malaise and fatigue Persisted, labs 04/12/22 shoed LDL 209, Hgb A1c increased to 7.2, Vit B12 309 11/2021, the patient desires to dc Metformin, try Glipizide 2.'5mg'$  to better managing blood sugar, adding Vit B12 1026mg qd, continue life style modification to managed cholesterol since the patient is allergic to statin, may consider increase Prozac to '50mg'$  qd in the future.   Diabetes mellitus type 2 in obese (HCC) Hgb A1c 6.8 11/30/21<<7.2 04/12/22, the patient desires to dc Metformin due to abd bloating, will try Glipizide 2.'5mg'$  qd, fasting CBG in am.   Gout No acute gout flare ups, uric acid 4.9 01/01/19, on  Allopurinol  Anxiety Feeling down,  on Alprazolam 0.'25mg'$  bid prn, will increase Prozac '50mg'$  qd, CT 06/23/21 unremarkable. TSH 1.99 11/30/21  Chronic GERD  stable on Nexium,   Essential hypertension Blood pressure is controlled,  takes Beets capsule,  Ziac 10/6.'25mg'$  qd.   Dyslipidemia  takes Omega 3, allergic to statin. LDL 209 04/12/22  DDD (degenerative disc disease), cervical DDD cervical and lumbar spine: s/p injs, pain is controlled. Vit D 37 11/30/21  CKD (chronic kidney disease) stage 3, GFR 30-59 ml/min (HCC) Bun/creat 29/1.10 04/12/22  Anemia due to chronic kidney disease Vit B12 307 12/01/22, Hgb 11.8 04/12/22   Labs/tests ordered: none  Next appt:  04/21/2022

## 2022-04-14 NOTE — Assessment & Plan Note (Signed)
Vit B12 307 12/01/22, Hgb 11.8 04/12/22

## 2022-04-14 NOTE — Assessment & Plan Note (Signed)
Bun/creat 29/1.10 04/12/22

## 2022-04-14 NOTE — Assessment & Plan Note (Signed)
Blood pressure is controlled,  takes Beets capsule,  Ziac 10/6.'25mg'$  qd.

## 2022-04-14 NOTE — Assessment & Plan Note (Signed)
No acute gout flare ups, uric acid 4.9 01/01/19, on Allopurinol

## 2022-04-14 NOTE — Assessment & Plan Note (Signed)
stable on Nexium,

## 2022-04-14 NOTE — Assessment & Plan Note (Signed)
DDD cervical and lumbar spine: s/p injs, pain is controlled. Vit D 37 11/30/21

## 2022-04-14 NOTE — Assessment & Plan Note (Addendum)
Hgb A1c 6.8 11/30/21<<7.2 04/12/22, the patient desires to dc Metformin due to abd bloating, will try Glipizide 2.'5mg'$  qd, fasting CBG in am.

## 2022-04-15 ENCOUNTER — Encounter: Payer: Self-pay | Admitting: Nurse Practitioner

## 2022-04-15 LAB — MICROALBUMIN / CREATININE URINE RATIO
Creatinine, Urine: 34 mg/dL (ref 20–275)
Microalb Creat Ratio: 132 mcg/mg creat — ABNORMAL HIGH (ref <30)
Microalb, Ur: 4.5 mg/dL

## 2022-04-18 ENCOUNTER — Other Ambulatory Visit: Payer: Self-pay | Admitting: Nurse Practitioner

## 2022-04-18 DIAGNOSIS — E669 Obesity, unspecified: Secondary | ICD-10-CM

## 2022-04-20 ENCOUNTER — Encounter: Payer: Self-pay | Admitting: Nurse Practitioner

## 2022-04-21 ENCOUNTER — Ambulatory Visit (SKILLED_NURSING_FACILITY): Payer: Medicare PPO | Admitting: Nurse Practitioner

## 2022-04-21 ENCOUNTER — Encounter: Payer: Self-pay | Admitting: Nurse Practitioner

## 2022-04-21 VITALS — BP 138/80 | HR 71 | Temp 97.1°F | Resp 18 | Ht 62.0 in | Wt 204.0 lb

## 2022-04-21 DIAGNOSIS — Z0000 Encounter for general adult medical examination without abnormal findings: Secondary | ICD-10-CM | POA: Diagnosis not present

## 2022-04-21 DIAGNOSIS — Z66 Do not resuscitate: Secondary | ICD-10-CM | POA: Diagnosis not present

## 2022-04-21 NOTE — Progress Notes (Signed)
Subjective:   Brittany Mcclure is a 82 y.o. female who presents for Medicare Annual (Subsequent) preventive examination in the clinic [facility]       Objective:    Today's Vitals   04/20/22 1133  BP: 138/80  Pulse: 71  Resp: 18  Temp: (!) 97.1 F (36.2 C)  SpO2: 97%  Weight: 204 lb (92.5 kg)  Height: '5\' 2"'$  (6.222 m)   Body mass index is 37.31 kg/m.     04/20/2022   11:35 AM 04/13/2022    9:01 AM 04/07/2022    8:28 AM 03/30/2022   10:30 AM 11/24/2021   11:55 AM 05/27/2021    2:34 PM 04/29/2021    2:37 PM  Advanced Directives  Does Patient Have a Medical Advance Directive? Yes No No No No Yes Yes  Type of Paramedic of [place];Living will;Out of facility DNR (pink MOST or yellow form)     [place];Living will;Out of facility DNR (pink MOST or yellow form) [place];Living will;Out of facility DNR (pink MOST or yellow form)  Does patient want to make changes to medical advance directive? No - Patient declined     No - Patient declined No - Patient declined  Copy of [place] in Chart? Yes - validated most recent copy scanned in chart (See row information)     Yes - validated most recent copy scanned in chart (See row information) Yes - validated most recent copy scanned in chart (See row information)  Would patient like information on creating a medical advance directive?  No - Patient declined No - Patient declined No - Patient declined No - Patient declined    Pre-existing out of facility DNR order (yellow form or pink MOST form) Yellow form placed in chart (order not valid for inpatient use)     Yellow form placed in chart (order not valid for inpatient use) Yellow form placed in chart (order not valid for inpatient use)    Current Medications (verified) Outpatient Encounter Medications as of 04/21/2022  Medication Sig   Accu-Chek FastClix Lancets MISC USE AS DIRECTED.   acetaminophen  (TYLENOL) 500 MG tablet Take 500 mg by mouth every 6 (six) hours as needed.   allopurinol (ZYLOPRIM) 100 MG tablet TAKE 2 TABLETS BY MOUTH ONCE DAILY.   ALPRAZolam (XANAX) 0.25 MG tablet TAKE ONE TABLET BY MOUTH TWICE DAILY AS NEEDED   bisoprolol-hydrochlorothiazide (ZIAC) 10-6.25 MG tablet TAKE ONE TABLET BY MOUTH DAILY   Cholecalciferol (VITAMIN D3) 50 MCG (2000 UT) capsule Take 2,000 Units by mouth daily.   CRANBERRY PO Take by mouth.   cyanocobalamin (VITAMIN B12) 1000 MCG tablet Take 1 tablet (1,000 mcg total) by mouth daily.   esomeprazole (NEXIUM) 40 MG capsule TAKE 1 CAPSULE TWICE DAILY BEFORE MEALS   FLUoxetine (PROZAC) 40 MG capsule TAKE (1) CAPSULE DAILY.   glipiZIDE (GLUCOTROL) 5 MG tablet Take 0.5 tablets (2.5 mg total) by mouth 2 (two) times daily before a meal.   glucose blood (ACCU-CHEK SMARTVIEW) test strip CHECK BLOOD SUGAR 3 TIMES DAILY AS DIRECTED.   Multiple Vitamins-Minerals (PRESERVISION AREDS PO) Take 1 tablet by mouth in the morning and at bedtime.   NON FORMULARY Beet Root 1 capsule daily   Omega-3 Fatty Acids (FISH OIL) 1000 MG CAPS Take 1 capsule by mouth daily.   No facility-administered encounter medications on file as of 04/21/2022.    Allergies (verified) Crestor [rosuvastatin calcium], Elemental sulfur, Gemfibrozil,  Iodine, Lipitor [atorvastatin calcium], Lovastatin, Sulfonamide derivatives, and Zetia [ezetimibe]   History: Past Medical History:  Diagnosis Date   Allergy    Anemia    Anxiety    Arthritis    with gout   Carotid bruit    right   Cataract    bil cataracts removed   Chest pain    Chronic kidney disease    Colon polyps    Depression    Diabetes type 2, uncontrolled    Diverticulosis    Duodenitis    Exogenous obesity    Gastric ulcer    GERD (gastroesophageal reflux disease)    Hyperlipidemia    Hypertension    Metabolic syndrome    Osteopenia    Seasonal allergies    seasonal   Tubular adenoma of colon    Past Surgical  History:  Procedure Laterality Date   COLONOSCOPY     11/90,11/91,1994,2000,2010   DILATION AND CURETTAGE OF UTERUS     DILATION AND CURETTAGE OF UTERUS  1997   FOOT SURGERY     x 2   HAND SURGERY Right    Carpel Tunnel   POLYPECTOMY     TUBAL LIGATION     uterine polyps removed     x2   Family History  Problem Relation Age of Onset   Colon cancer Mother    Stroke Father    Hypertension Father    Heart failure Maternal Grandmother    Diabetes Maternal Grandmother        ?   Rectal cancer Neg Hx    Stomach cancer Neg Hx    Esophageal cancer Neg Hx    Pancreatic cancer Neg Hx    Social History   Socioeconomic History   Marital status: Widowed    Spouse name: Not on file   Number of children: 2   Years of education: Not on file   Highest education level: Not on file  Occupational History   Occupation: retired    Fish farm manager: RETIRED  Tobacco Use   Smoking status: Former   Smokeless tobacco: Never   Tobacco comments:    Late 20's or early 30's  Vaping Use   Vaping Use: Never used  Substance and Sexual Activity   Alcohol use: Yes    Alcohol/week: 0.0 standard drinks of alcohol    Comment: socially   Drug use: No   Sexual activity: Not on file  Other Topics Concern   Not on file  Social History Narrative   Tobacco use, amount per day now: None      Past tobacco use, amount per day: in her late 78s a pack per day      How many years did you use tobacco: 10      Alcohol use (drinks per week): occasional wine      Diet:      Do you drink/eat things with caffeine? Tea, soft drinks and coffee      Marital status: Married             What year were you married? 1962      Do you live in a house, apartment, assisted living, condo, trailer? Apartment      Is it one or more stories? Yes      How many persons live in your home? 0      Do you have any pets in your home? 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 Yes             How often? Water aerobics       Do you have a living will? Yes      Do you have a DNR form? Yes           If not, do you want to discuss one?      Do you have signed POA/HPOA forms?  Yes            Social Determinants of Health   Financial Resource Strain: Low Risk  (02/21/2017)   Overall Financial Resource Strain (CARDIA)    Difficulty of Paying Living Expenses: Not hard at all  Food Insecurity: No Food Insecurity (02/21/2017)   Hunger Vital Sign    Worried About Running Out of Food in the Last Year: Never true    Ran Out of Food in the Last Year: Never true  Transportation Needs: No Transportation Needs (02/21/2017)   PRAPARE - Hydrologist (Medical): No    Lack of Transportation (Non-Medical): No  Physical Activity: Insufficiently Active (02/21/2017)   Exercise Vital Sign    Days of Exercise per Week: 3 days    Minutes of Exercise per Session: 30 min  Stress: No Stress Concern Present (02/21/2017)   [place]    Feeling of Stress : Only a little  Social Connections: Moderately Integrated (03/05/2018)   Social Connection and Isolation Panel [NHANES]    Frequency of Communication with Friends and Family: More than three times a week    Frequency of Social Gatherings with Friends and Family: More than three times a week    Attends Religious Services: 1 to 4 times per year    Active Member of [employer] or Organizations: Yes    Attends Archivist Meetings: 1 to 4 times per year    Marital Status: Widowed    Tobacco Counseling Counseling given: Not Answered Tobacco comments: Late 20's or early 30's   Clinical Intake:  Pre-visit preparation completed: Yes  Pain : No/denies pain     BMI - recorded: 37.31 Nutritional Status: BMI > 30  Obese Nutritional Risks: None Diabetes: Yes CBG done?: No Did pt. bring in CBG monitor from home?: No  How often do you need to have  someone help you when you read instructions, pamphlets, or other written materials from your doctor or pharmacy?: 2 - Rarely What is the last grade level you completed in school?: college 2 years  Diabetic?yes  Interpreter Needed?: No  Information entered by :: Alexine Pilant Bretta Bang NP   Activities of Daily Living    04/21/2022    2:07 PM 04/29/2021    3:29 PM  In your present state of health, do you have any difficulty performing the following activities:  Hearing? 0 1  Vision? 0 0  Difficulty concentrating or making decisions? 0 0  Walking or climbing stairs? 0 0  Dressing or bathing? 0 0  Doing errands, shopping? 0 0  Preparing Food and eating ?  N  Using the Toilet?  N  In the past six months, have you accidently leaked urine?  N  Do you have problems with loss of bowel control?  N  Managing your Medications?  N  Managing your Finances?  N  Housekeeping or managing your Housekeeping?  N    Patient Care Team: Jaren Vanetten X, NP as PCP - General (Internal  Medicine) Iran Planas, MD as Consulting Physician (Orthopedic Surgery) Marygrace Drought, MD as Consulting Physician (Ophthalmology)  Indicate any recent Medical Services you may have received from other than [facility] providers in the past year (date may be approximate).     Assessment:   This is a routine wellness examination for Angelin.  Hearing/Vision screen No results found.  Dietary issues and exercise activities discussed:     Goals Addressed             This Visit's Progress    Develop a Weight Loss Readiness Plan       Timeframe:  Long-Range Goal Priority:  Medium Start Date:                             Expected End Date:                       Follow Up Date 04/22/2023    - get rid of junk food - track feelings, emotional and physical, when eating    Why is this important?   Losing weight requires time to prepare your mind and your home.  Identify your eating habits and the emotions attached to eating.  Think  about ways to be successful.  When you are not ready, you could lose motivation and give up on your plan.     Notes:        Depression Screen    04/29/2021    3:15 PM 04/23/2020    3:03 PM 03/05/2018   11:29 AM 01/18/2018    3:32 PM 02/21/2017    1:36 PM 06/12/2015   11:21 AM 02/05/2015    4:44 PM  PHQ 2/9 Scores  PHQ - 2 Score 0 0 0 0 1 0 0    Fall Risk    04/13/2022    9:01 AM 04/07/2022    8:28 AM 03/30/2022   10:31 AM 11/24/2021   11:55 AM 05/27/2021    2:30 PM  [place] in the past year? 0 0 0 0 0  Number falls in past yr: 0 0 0 0 0  Injury with Fall? 0 0 0 0 0  Risk for fall due to : No Fall Risks No Fall Risks No Fall Risks No Fall Risks No Fall Risks  Follow up Falls evaluation completed Falls evaluation completed Falls evaluation completed Falls evaluation completed     FALL RISK PREVENTION PERTAINING TO THE HOME:  Any stairs in or around the home? Yes  If so, are there any without handrails? No  Home free of loose throw rugs in walkways, pet beds, electrical cords, etc? Yes  Adequate lighting in your home to reduce risk of falls? Yes   ASSISTIVE DEVICES UTILIZED TO PREVENT FALLS:  Life alert? Yes  Use of a cane, walker or w/c? No  Grab bars in the bathroom? Yes  Shower chair or bench in shower? Yes  Elevated toilet seat or a handicapped toilet? Yes   TIMED UP AND GO:  Was the test performed? No .    Cognitive Function:    04/21/2022    1:59 PM 03/05/2018   11:38 AM 02/21/2017    1:36 PM  MMSE - Mini Mental State Exam  Orientation to time '5 5 4  '$ Orientation to Place '5 5 5  '$ Registration '3 3 3  '$ Attention/ Calculation '5 5 5  '$ Recall '3 3 2  '$ Language-  name 2 objects '2 2 2  '$ Language- repeat '1 1 1  '$ Language- follow 3 step command '3 3 3  '$ Language- read & follow direction '1 1 1  '$ Write a sentence '1 1 1  '$ Copy design '1 1 1  '$ Total score '30 30 28        '$ 04/29/2021    3:20 PM 04/23/2020    3:05 PM  6CIT Screen  What Year? 0 points 0 points  What  month? 0 points 0 points  What time? 0 points 0 points  Count back from 20 0 points 0 points  Months in reverse 0 points 4 points  Repeat phrase 0 points 2 points  Total Score 0 points 6 points    Immunizations Immunization History  Administered Date(s) Administered   Fluad Quad(high Dose 65+) 12/27/2018   Influenza Whole 12/27/2010   Influenza, High Dose Seasonal PF 12/06/2017   Influenza-Unspecified 10/20/2014, 12/19/2016, 12/02/2020, 01/10/2022   Moderna Sars-Covid-2 Vaccination 03/25/2019, 04/22/2019, 01/28/2020, 08/18/2020   Pfizer Covid-19 Vaccine Bivalent Booster 66yr & up 12/02/2020, 01/20/2022   Pneumococcal Conjugate-13 10/09/2014   Pneumococcal-Unspecified 09/19/2014   Respiratory Syncytial Virus Vaccine,Recomb Aduvanted(Arexvy) 03/18/2022   Tdap 03/03/2015   Zoster Recombinat (Shingrix) 12/06/2017, 02/20/2018   Zoster, Live 09/19/2014    TDAP status: Up to date  Flu Vaccine status: Up to date  Pneumococcal vaccine status: Up to date  Covid-19 vaccine status: Information provided on how to obtain vaccines.   Qualifies for Shingles Vaccine? Yes   Zostavax completed Yes   Shingrix Completed?: Yes  Screening Tests Health Maintenance  Topic Date Due   MAMMOGRAM  11/20/2020   COVID-19 Vaccine (7 - 2023-24 season) 06/21/2022 (Originally 03/17/2022)   HEMOGLOBIN A1C  10/11/2022   FOOT EXAM  04/08/2023   Diabetic kidney evaluation - eGFR measurement  04/13/2023   Diabetic kidney evaluation - Urine ACR  04/15/2023   Medicare Annual Wellness (AWV)  04/22/2023   DTaP/Tdap/Td (2 - Td or Tdap) 03/02/2025   INFLUENZA VACCINE  Completed   DEXA SCAN  Completed   Zoster Vaccines- Shingrix  Completed   HPV VACCINES  Aged Out   Pneumonia Vaccine 82 Years old  Discontinued   OCarteretMaintenance Due  Topic Date Due   MAMMOGRAM  11/20/2020    Colorectal cancer screening: No longer required.   Mammogram  status: No longer required due to aged out.  Bone Density status: Ordered 04/21/22. Pt provided with contact info and advised to call to schedule appt.  Lung Cancer Screening: (Low Dose CT Chest recommended if Age 84-80years, 30 pack-year currently smoking OR have quit w/in 15years.) does not qualify.     Additional Screening:  Hepatitis C Screening: does not qualify  Vision Screening: Recommended annual ophthalmology exams for early detection of glaucoma and other disorders of the eye. Is the patient up to date with their annual eye exam?  Yes  Who is the provider or what is the name of the office in which the patient attends annual eye exams? Dr. TSatira SarkIf pt is not established with a provider, would they like to be referred to a provider to establish care? No .   Dental Screening: Recommended annual dental exams for proper oral hygiene  Community Resource Referral / Chronic Care Management: CRR required this visit?  No   CCM required this visit?  No      Plan:     I have personally reviewed and  noted the following in the patient's chart:   Medical and social history Use of alcohol, tobacco or illicit drugs  Current medications and supplements including opioid prescriptions. Patient is not currently taking opioid prescriptions. Functional ability and status Nutritional status Physical activity Advanced directives List of other physicians Hospitalizations, surgeries, and ER visits in previous 12 months Vitals Screenings to include cognitive, depression, and falls Referrals and appointments  In addition, I have reviewed and discussed with patient certain preventive protocols, quality metrics, and best practice recommendations. A written personalized care plan for preventive services as well as general preventive health recommendations were provided to patient.     Ahri Olson X Casady Voshell, NP   04/21/2022

## 2022-05-16 ENCOUNTER — Telehealth: Payer: Medicare PPO | Admitting: *Deleted

## 2022-05-16 DIAGNOSIS — E669 Obesity, unspecified: Secondary | ICD-10-CM

## 2022-05-16 MED ORDER — GLIPIZIDE 5 MG PO TABS: 5.0000 mg | ORAL_TABLET | Freq: Two times a day (BID) | ORAL | 3 refills | Status: DC

## 2022-05-16 NOTE — Telephone Encounter (Signed)
Patient called and stated that she has been taking her Blood Sugar Medication Glipizide '5mg'$  Wrong.   Stated that she has been taking a WHOLE tablet twice daily Instead of 1/2 tablet twice daily.   Stated that she is doing well on the WHOLE tablet twice daily and her blood sugars have been good.   Patient is wondering if she can continue taking the WHOLE tablet twice daily and if so a Rx has to be sent to her pharmacy.   Please Advise.

## 2022-05-16 NOTE — Telephone Encounter (Signed)
Mast, Man X, NP  You1 hour ago (12:26 PM)   Okay since her blood sugar is stable.  Thanks.    Rx updated and sent to [facility] for approval due to [place].

## 2022-06-20 DIAGNOSIS — H04123 Dry eye syndrome of bilateral lacrimal glands: Secondary | ICD-10-CM | POA: Diagnosis not present

## 2022-06-20 DIAGNOSIS — H353132 Nonexudative age-related macular degeneration, bilateral, intermediate dry stage: Secondary | ICD-10-CM | POA: Diagnosis not present

## 2022-06-20 DIAGNOSIS — H52203 Unspecified astigmatism, bilateral: Secondary | ICD-10-CM | POA: Diagnosis not present

## 2022-06-20 DIAGNOSIS — H43813 Vitreous degeneration, bilateral: Secondary | ICD-10-CM | POA: Diagnosis not present

## 2022-06-20 DIAGNOSIS — H524 Presbyopia: Secondary | ICD-10-CM | POA: Diagnosis not present

## 2022-06-20 DIAGNOSIS — E119 Type 2 diabetes mellitus without complications: Secondary | ICD-10-CM | POA: Diagnosis not present

## 2022-07-04 ENCOUNTER — Encounter: Payer: Self-pay | Admitting: Adult Health

## 2022-07-04 NOTE — Progress Notes (Signed)
She placed a call on 07-01-22 requesting abt for an infected tooth until she could be seen by dentist was give amoxicillin 500 mg three times daily for 5 days

## 2022-07-08 ENCOUNTER — Other Ambulatory Visit: Payer: Self-pay | Admitting: Nurse Practitioner

## 2022-07-08 NOTE — Telephone Encounter (Signed)
Pharmacy requested refill.  Epic LR: 01/10/2022 No Contract on File No Upcoming appointment  Pended Rx and sent to [facility] for approval.

## 2022-08-22 ENCOUNTER — Other Ambulatory Visit: Payer: Self-pay

## 2022-08-22 MED ORDER — FLUOXETINE HCL 40 MG PO CAPS: ORAL_CAPSULE | ORAL | 2 refills | Status: DC

## 2022-08-22 NOTE — Telephone Encounter (Signed)
Refill request received from [facility]

## 2022-09-10 IMAGING — CT CT HEAD W/O CM
1 series · 16 of 30 positions shown, 20 images · non-contrast
Comparison: None.

CLINICAL DATA: Minor head trauma.  Syncope with fall 1 year ago.



[Series 2: head w/(date) · axial · 0.41mm/px · z∈[+51,+186]mm · 16 of 31 slices shown, 20 images]
[im 2/31  brain]
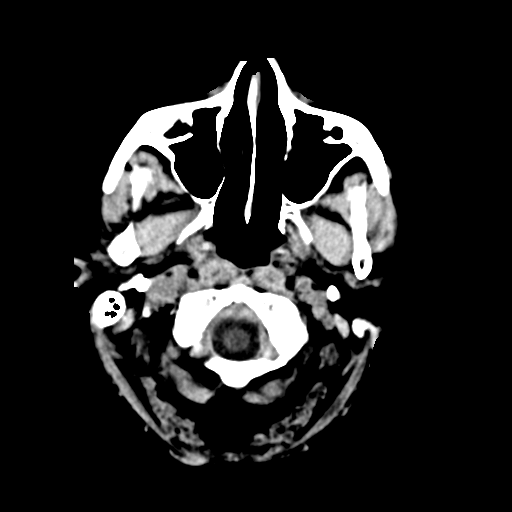
[im 2/31  bone]
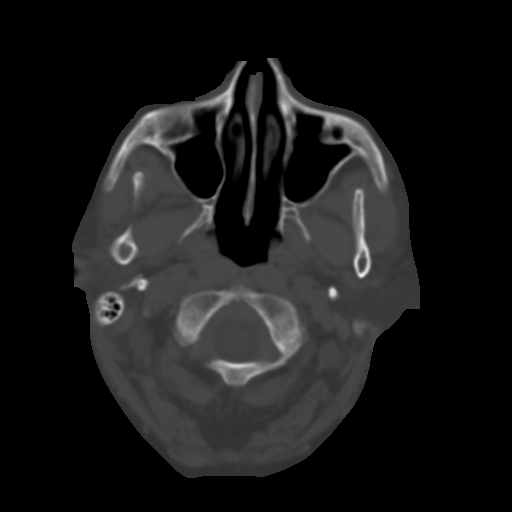
[im 4/31  brain]
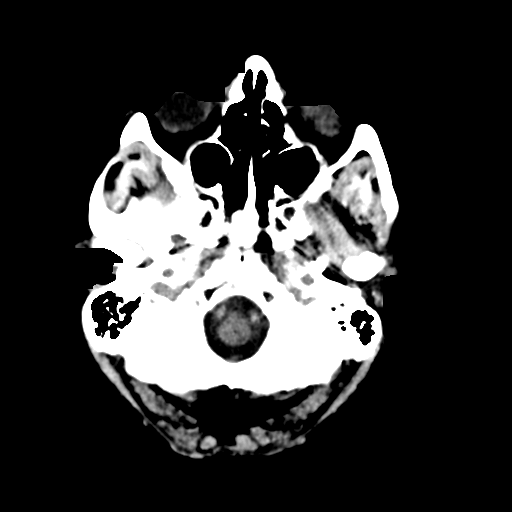
[im 6/31  brain]
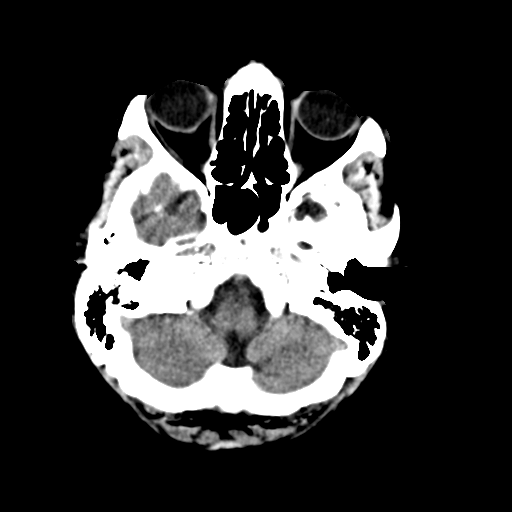
[im 8/31  brain]
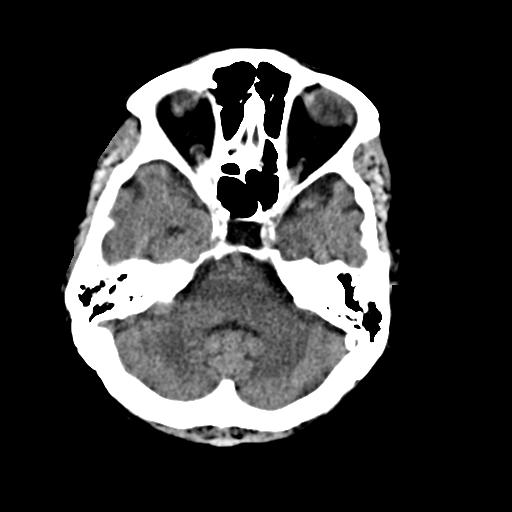
[im 9/31  brain]
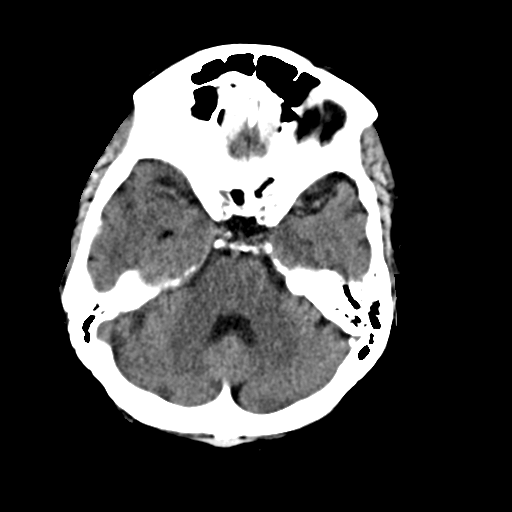
[im 9/31  bone]
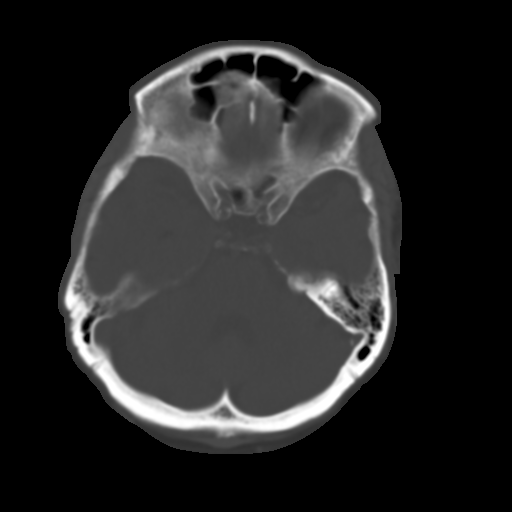
[im 11/31  brain]
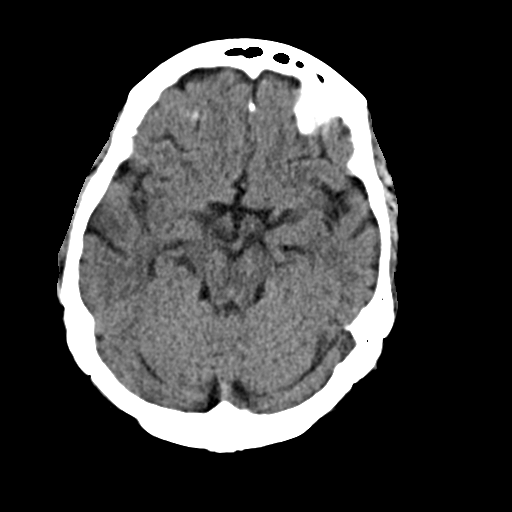
[im 13/31  brain]
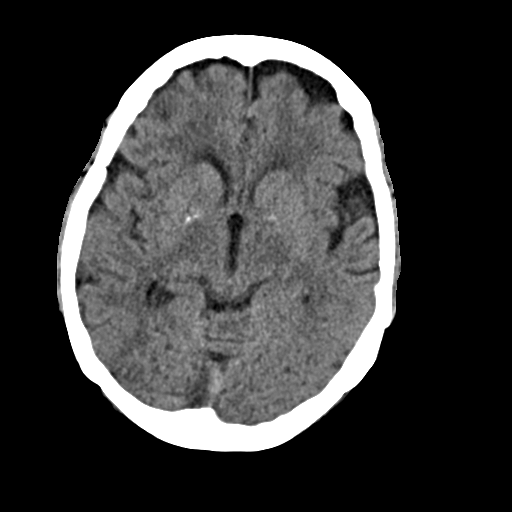
[im 15/31  brain]
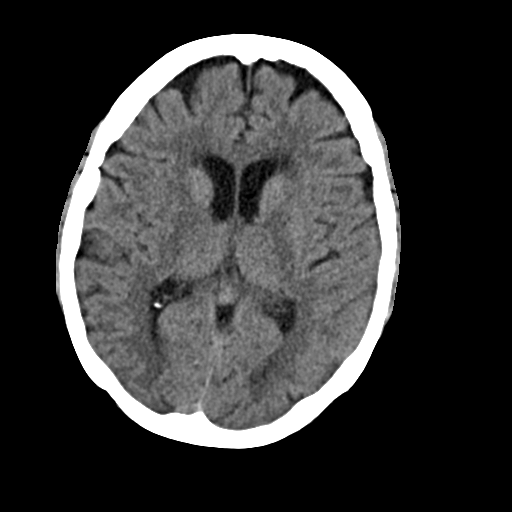
[im 16/31  brain]
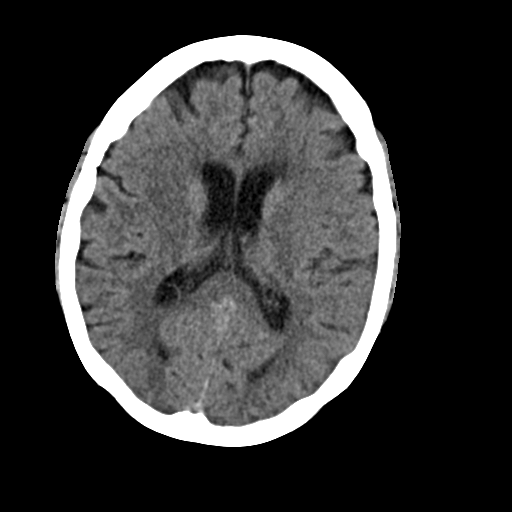
[im 16/31  bone]
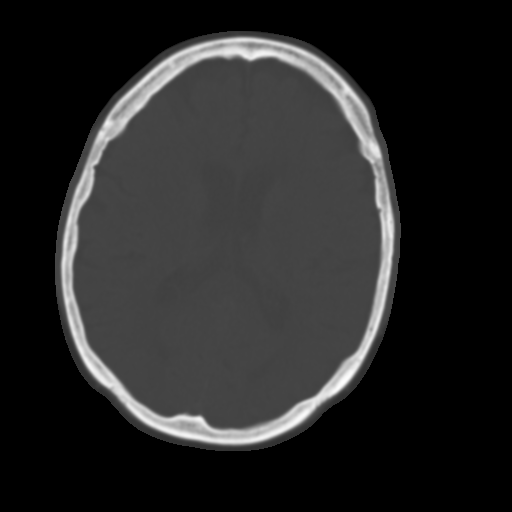
[im 18/31  brain]
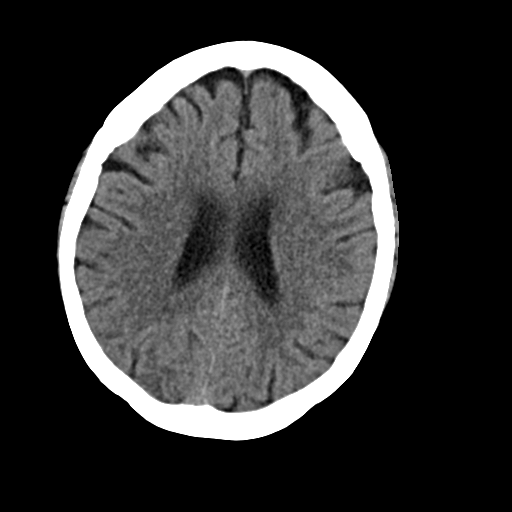
[im 20/31  brain]
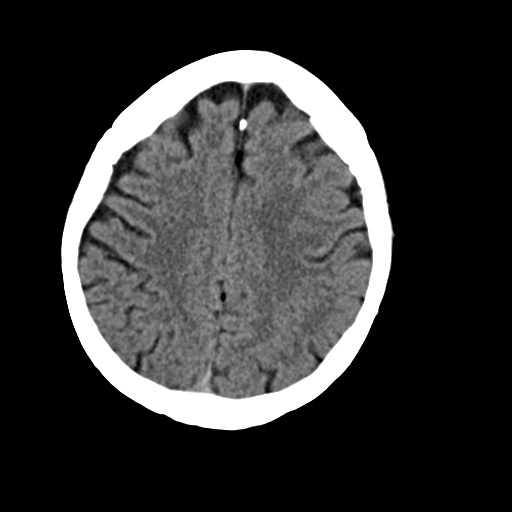
[im 22/31  brain]
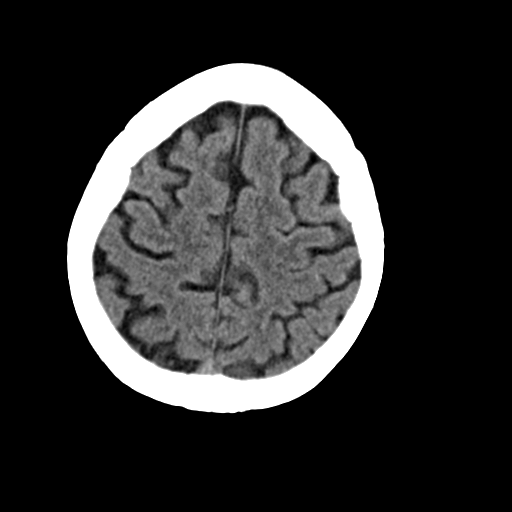
[im 23/31  brain]
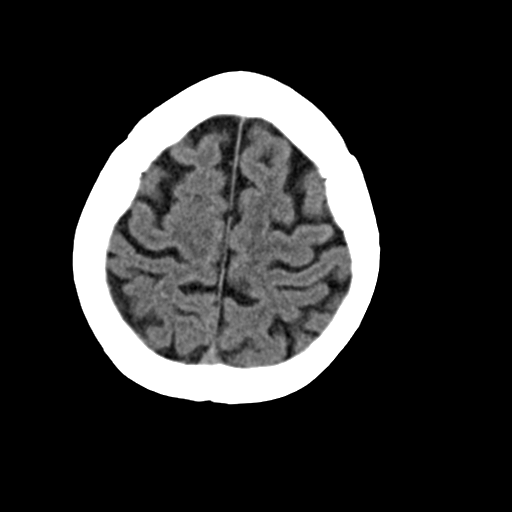
[im 23/31  bone]
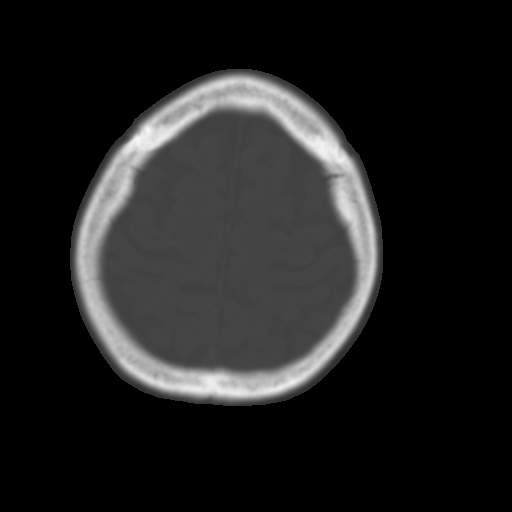
[im 25/31  brain]
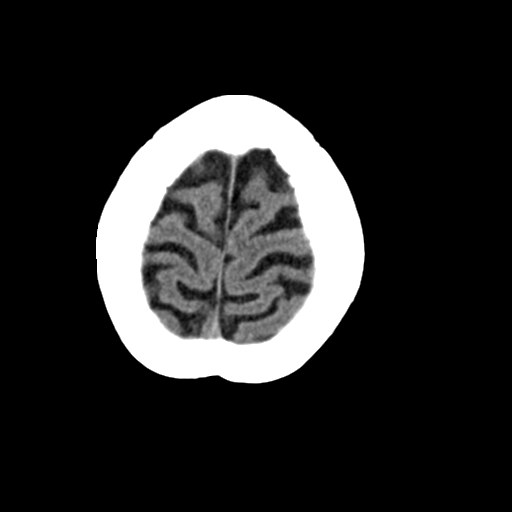
[im 27/31  brain]
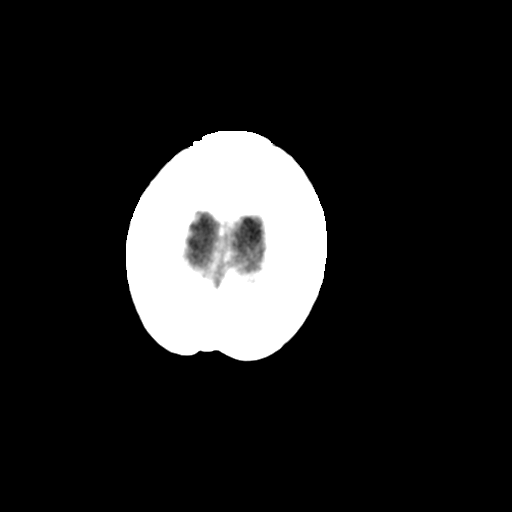
[im 29/31  brain]
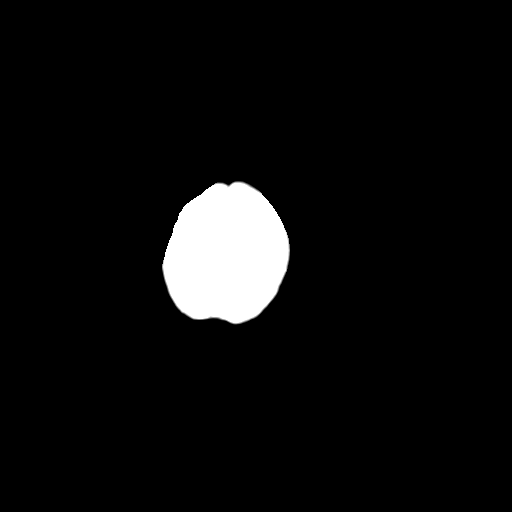

[16 of 30 positions shown; findings below may reference images not displayed]

FINDINGS: Brain: No evidence of acute infarction, hemorrhage, hydrocephalus,
extra-axial collection or mass lesion/mass effect. Partially empty
sella, nonspecific in isolation. Mild for age cerebral volume loss
and periventricular white matter low-density.

Vascular: No hyperdense vessel or unexpected calcification.

Skull: Normal. Negative for fracture or focal lesion.

Sinuses/Orbits: Bilateral cataract resection
IMPRESSION: Unremarkable head CT for age.

## 2022-09-14 ENCOUNTER — Other Ambulatory Visit: Payer: Self-pay | Admitting: Nurse Practitioner

## 2022-09-14 DIAGNOSIS — E1169 Type 2 diabetes mellitus with other specified complication: Secondary | ICD-10-CM

## 2022-09-14 NOTE — Telephone Encounter (Signed)
High allergy warning came up when trying to refill medication.  Medication pended and sent to Man X Mast, NP

## 2022-10-10 ENCOUNTER — Other Ambulatory Visit: Payer: Self-pay | Admitting: Nurse Practitioner

## 2022-11-02 ENCOUNTER — Ambulatory Visit
Admission: RE | Admit: 2022-11-02 | Discharge: 2022-11-02 | Disposition: A | Discharge status: Home / Self Care | Payer: Medicare PPO | Source: Ambulatory Visit | Attending: Nurse Practitioner | Admitting: Nurse Practitioner

## 2022-11-02 DIAGNOSIS — N958 Other specified menopausal and perimenopausal disorders: Secondary | ICD-10-CM | POA: Diagnosis not present

## 2022-11-02 DIAGNOSIS — Z0000 Encounter for general adult medical examination without abnormal findings: Secondary | ICD-10-CM

## 2022-11-02 DIAGNOSIS — E349 Endocrine disorder, unspecified: Secondary | ICD-10-CM | POA: Diagnosis not present

## 2022-11-02 DIAGNOSIS — M8588 Other specified disorders of bone density and structure, other site: Secondary | ICD-10-CM | POA: Diagnosis not present

## 2022-11-04 ENCOUNTER — Encounter: Payer: Self-pay | Admitting: Nurse Practitioner

## 2022-11-04 DIAGNOSIS — Z780 Asymptomatic menopausal state: Secondary | ICD-10-CM | POA: Insufficient documentation

## 2022-11-08 ENCOUNTER — Other Ambulatory Visit: Payer: Self-pay | Admitting: Nurse Practitioner

## 2022-11-08 MED ORDER — ALENDRONATE SODIUM 70 MG PO TABS: 70.0000 mg | ORAL_TABLET | ORAL | 11 refills | Status: DC

## 2022-12-30 ENCOUNTER — Encounter: Payer: Self-pay | Admitting: Sports Medicine

## 2022-12-30 ENCOUNTER — Non-Acute Institutional Stay: Payer: Medicare PPO | Admitting: Sports Medicine

## 2022-12-30 VITALS — BP 136/88 | HR 74 | Temp 96.7°F | Resp 18 | Ht 62.0 in | Wt 205.6 lb

## 2022-12-30 DIAGNOSIS — L989 Disorder of the skin and subcutaneous tissue, unspecified: Secondary | ICD-10-CM

## 2022-12-30 DIAGNOSIS — Z6837 Body mass index (BMI) 37.0-37.9, adult: Secondary | ICD-10-CM | POA: Diagnosis not present

## 2022-12-30 DIAGNOSIS — K219 Gastro-esophageal reflux disease without esophagitis: Secondary | ICD-10-CM

## 2022-12-30 DIAGNOSIS — B3789 Other sites of candidiasis: Secondary | ICD-10-CM | POA: Diagnosis not present

## 2022-12-30 DIAGNOSIS — E669 Obesity, unspecified: Secondary | ICD-10-CM

## 2022-12-30 DIAGNOSIS — E66812 Obesity, class 2: Secondary | ICD-10-CM

## 2022-12-30 DIAGNOSIS — E1169 Type 2 diabetes mellitus with other specified complication: Secondary | ICD-10-CM

## 2022-12-30 MED ORDER — OZEMPIC (0.25 OR 0.5 MG/DOSE) 2 MG/3ML ~~LOC~~ SOPN
0.2500 mg | PEN_INJECTOR | SUBCUTANEOUS | 0 refills | Status: DC
Start: 2022-12-30 — End: 2023-02-15

## 2022-12-30 MED ORDER — NYSTATIN 100000 UNIT/GM EX POWD
1.0000 | Freq: Three times a day (TID) | CUTANEOUS | 4 refills | Status: DC
Start: 2022-12-30 — End: 2023-04-14

## 2022-12-30 NOTE — Progress Notes (Signed)
Careteam: Patient Care Team: Mast, Man X, NP as PCP - General (Internal Medicine) Bradly Bienenstock, MD as Consulting Physician (Orthopedic Surgery) Janet Berlin, MD as Consulting Physician (Ophthalmology)  PLACE OF SERVICE:  [facility]  Advanced Directive information Does Patient Have a Medical Advance Directive?: Yes, Type of Advance Directive: Healthcare Power of Marina;Living will;Out of facility DNR (pink MOST or yellow form), Does patient want to make changes to medical advance directive?: No - Patient declined  Allergies  Allergen Reactions   Crestor [Rosuvastatin Calcium]     Myalgias    Elemental Sulfur     Made infection worse   Gemfibrozil Itching   Iodine     Skin rash   Lipitor [Atorvastatin Calcium]     Myalgias    Lovastatin     myalgias   Sulfonamide Derivatives     REACTION: n\T\v   Zetia [Ezetimibe]     Cough, dry    Chief Complaint  Patient presents with   Acute Visit    Patient complains of rash spreading between her legs that may be caused by new prescription Nexium     HPI: Patient is a 82 y.o. female  is here for groin rash  She noticed it few days ago  Tried otc cream, benadryl cream,  hydrocortisone  Obesity  Pt reports that she sits and reads books most of the day  Does not exercise   Skin lesions C/o dry spots on her body    Review of Systems:  Review of Systems  Constitutional:  Negative for chills and fever.  HENT:  Negative for congestion and sore throat.   Eyes:  Negative for double vision.  Respiratory:  Negative for cough, sputum production and shortness of breath.   Cardiovascular:  Negative for chest pain, palpitations and leg swelling.  Gastrointestinal:  Negative for abdominal pain, heartburn and nausea.  Genitourinary:  Negative for dysuria, frequency and hematuria.  Musculoskeletal:  Negative for falls and myalgias.  Skin:  Positive for itching and rash.  Neurological:  Negative for dizziness, sensory change  and focal weakness.    Past Medical History:  Diagnosis Date   Allergy    Anemia    Anxiety    Arthritis    with gout   Carotid bruit    right   Cataract    bil cataracts removed   Chest pain    Chronic kidney disease    Colon polyps    Depression    Diabetes type 2, uncontrolled    Diverticulosis    Duodenitis    Exogenous obesity    Gastric ulcer    GERD (gastroesophageal reflux disease)    Hyperlipidemia    Hypertension    Metabolic syndrome    Osteopenia    Seasonal allergies    seasonal   Tubular adenoma of colon    Past Surgical History:  Procedure Laterality Date   COLONOSCOPY     11/90,11/91,1994,2000,2010   DILATION AND CURETTAGE OF UTERUS     DILATION AND CURETTAGE OF UTERUS  1997   FOOT SURGERY     x 2   HAND SURGERY Right    Carpel Tunnel   POLYPECTOMY     TUBAL LIGATION     uterine polyps removed     x2   Social History:   reports that she has quit smoking. She has never used smokeless tobacco. She reports current alcohol use. She reports that she does not use drugs.  Family History  Problem Relation Age of Onset   Colon cancer Mother    Stroke Father    Hypertension Father    Heart failure Maternal Grandmother    Diabetes Maternal Grandmother        ?   Rectal cancer Neg Hx    Stomach cancer Neg Hx    Esophageal cancer Neg Hx    Pancreatic cancer Neg Hx     Medications: Patient's Medications  New Prescriptions   No medications on file  Previous Medications   ACCU-CHEK FASTCLIX LANCETS MISC    USE AS DIRECTED.   ACETAMINOPHEN (TYLENOL) 500 MG TABLET    Take 1,000 mg by mouth every morning.   ACETAMINOPHEN (TYLENOL) 650 MG CR TABLET    Take 1,300 mg by mouth at bedtime.   ALENDRONATE (FOSAMAX) 70 MG TABLET    Take 1 tablet (70 mg total) by mouth every 7 (seven) days. Take with a full glass of water on an empty stomach.   ALLOPURINOL (ZYLOPRIM) 100 MG TABLET    TAKE TWO TABLETS BY MOUTH ONCE DAILY   ALPRAZOLAM (XANAX) 0.25 MG TABLET     TAKE ONE TABLET BY MOUTH TWICE DAILY AS NEEDED   BIOTIN 5000 MCG CAPS    Take 1 capsule by mouth daily.   BISOPROLOL-HYDROCHLOROTHIAZIDE (ZIAC) 10-6.25 MG TABLET    TAKE ONE TABLET BY MOUTH DAILY   CALCIUM PO    Take 600 mg by mouth daily.   CHOLECALCIFEROL (VITAMIN D3) 50 MCG (2000 UT) CAPSULE    Take 2,000 Units by mouth daily.   CRANBERRY PO    Take by mouth.   CYANOCOBALAMIN (VITAMIN B12) 1000 MCG TABLET    Take 1 tablet (1,000 mcg total) by mouth daily.   FLUOXETINE (PROZAC) 40 MG CAPSULE    TAKE (1) CAPSULE DAILY.   GLIPIZIDE (GLUCOTROL) 5 MG TABLET    Take 1 tablet (5 mg total) by mouth 2 (two) times daily before a meal.   GLUCOSE BLOOD (ACCU-CHEK SMARTVIEW) TEST STRIP    CHECK BLOOD SUGAR 3 TIMES DAILY AS DIRECTED.   MISC NATURAL PRODUCTS (NEURIVA PO)    Take 1 capsule by mouth daily at 12 noon.   MULTIPLE VITAMINS-MINERALS (PRESERVISION AREDS PO)    Take 1 tablet by mouth in the morning and at bedtime.   NON FORMULARY    Beet Root 1 capsule daily   OMEGA-3 FATTY ACIDS (FISH OIL) 1000 MG CAPS    Take 1 capsule by mouth daily.  Modified Medications   No medications on file  Discontinued Medications   ESOMEPRAZOLE (NEXIUM) 40 MG CAPSULE    TAKE 1 CAPSULE TWICE DAILY BEFORE MEALS    Physical Exam:  Vitals:   12/30/22 1023  BP: 136/88  Pulse: 74  Resp: 18  Temp: (!) 96.7 F (35.9 C)  SpO2: 97%  Weight: 205 lb 9.6 oz (93.3 kg)  Height: 5\' 2"  (1.575 m)   Body mass index is 37.6 kg/m. Wt Readings from Last 3 Encounters:  12/30/22 205 lb 9.6 oz (93.3 kg)  04/20/22 204 lb (92.5 kg)  04/14/22 201 lb (91.2 kg)    Physical Exam Constitutional:      Appearance: Normal appearance.  HENT:     Head: Normocephalic and atraumatic.  Cardiovascular:     Rate and Rhythm: Normal rate and regular rhythm.     Heart sounds: No murmur heard. Pulmonary:     Effort: Pulmonary effort is normal. No respiratory distress.     Breath sounds: Normal  breath sounds. No wheezing.   Abdominal:     General: Bowel sounds are normal. There is no distension.     Tenderness: There is no abdominal tenderness. There is no guarding or rebound.  Musculoskeletal:        General: No swelling or tenderness.  Skin:    Comments: Groin rash bilaterally  And rash under breast   Neurological:     Mental Status: She is alert. Mental status is at baseline.     Sensory: No sensory deficit.     Motor: No weakness.     Labs reviewed: Basic Metabolic Panel: Recent Labs    04/12/22 0703  NA 139  K 4.6  CL 105  CO2 25  GLUCOSE 114*  BUN 29*  CREATININE 1.10*  CALCIUM 9.2   Liver Function Tests: Recent Labs    04/12/22 0703  AST 13  ALT 12  BILITOT 0.5  PROT 6.4   No results for input(s): "LIPASE", "AMYLASE" in the last 8760 hours. No results for input(s): "AMMONIA" in the last 8760 hours. CBC: Recent Labs    04/12/22 0703  WBC 8.5  NEUTROABS 5,245  HGB 11.8  HCT 34.3*  MCV 91.2  PLT 264   Lipid Panel: Recent Labs    04/12/22 0703  CHOL 321*  HDL 47*  LDLCALC 209*  TRIG 381*  CHOLHDL 6.8*   TSH: No results for input(s): "TSH" in the last 8760 hours. A1C: Lab Results  Component Value Date   HGBA1C 7.2 (H) 04/12/2022     Assessment/Plan  1. Candida rash of groin  - nystatin (MYCOSTATIN/NYSTOP) powder; Apply 1 Application topically 3 (three) times daily.  Dispense: 15 g; Refill: 4  2. Class 2 severe obesity with serious comorbidity and body mass index (BMI) of 37.0 to 37.9 in adult, unspecified obesity type (HCC) Will start on ozempic Informed patient about the side effects of the medication  Instructed to lower sweets and exercise regularly - Semaglutide,0.25 or 0.5MG /DOS, (OZEMPIC, 0.25 OR 0.5 MG/DOSE,) 2 MG/3ML SOPN; Inject 0.25 mg into the skin once a week.  Dispense: 3 mL; Refill: 0  3. Type 2 diabetes mellitus with obesity (HCC) Will start on ozempic Spoke with RN Rosey Bath to do teaching  regarding administering ozempic Will follow up  in 4 weeks  - Semaglutide,0.25 or 0.5MG /DOS, (OZEMPIC, 0.25 OR 0.5 MG/DOSE,) 2 MG/3ML SOPN; Inject 0.25 mg into the skin once a week.  Dispense: 3 mL; Refill: 0  4. Gastroesophageal reflux disease without esophagitis Avoid spicy foods Denies dark stools Cont with  nexium   5. Skin lesions  - Ambulatory referral to Dermatology  Other orders - acetaminophen (TYLENOL) 650 MG CR tablet; Take 1,300 mg by mouth at bedtime. - Misc Natural Products (NEURIVA PO); Take 1 capsule by mouth daily at 12 noon. - Biotin 5000 MCG CAPS; Take 1 capsule by mouth daily. - CALCIUM PO; Take 600 mg by mouth daily.   No follow-ups on file.:  1 month

## 2023-01-02 ENCOUNTER — Encounter: Payer: Self-pay | Admitting: Sports Medicine

## 2023-01-03 ENCOUNTER — Telehealth: Payer: Self-pay

## 2023-01-03 NOTE — Telephone Encounter (Signed)
Message left on clinical intake voicemail:   Patient called to say she was seen last week for a rash and a prescription was to be sent in however, she has not received any medication for her rash. Patient asked for a return call to discuss.  I called the pharmacy first to confirm rx received and it was determined that rx received, although it requires a prior authorization (which we were not notified about prior to calling [facility]). The pharmacist emphasized that if patient pays out of pocket rx will only cost 19 dollars and some change.  Call returned to patient and I left a detailed message with the above.

## 2023-01-03 NOTE — Telephone Encounter (Signed)
Fax received from [facility] stating nystatin approved through 03/20/2024  I called and left a detailed message notifying patient. I also called the pharmacy to inform them medication was approved

## 2023-01-03 NOTE — Telephone Encounter (Signed)
Incoming fax received from patients pharmacy to initiate a prior authorization for                   .  PA initiated through covermymeds. Key: [id number]  Awaiting reply from the insurance company which will be determined in 48-72 hours.

## 2023-01-03 NOTE — Telephone Encounter (Signed)
Patient called you back. She preferred to speak to you if you can call her back when you return.

## 2023-01-13 ENCOUNTER — Other Ambulatory Visit: Payer: Self-pay | Admitting: Nurse Practitioner

## 2023-01-27 ENCOUNTER — Encounter: Payer: Medicare PPO | Admitting: Sports Medicine

## 2023-02-15 ENCOUNTER — Other Ambulatory Visit: Payer: Self-pay

## 2023-02-15 DIAGNOSIS — E1169 Type 2 diabetes mellitus with other specified complication: Secondary | ICD-10-CM

## 2023-02-15 MED ORDER — OZEMPIC (0.25 OR 0.5 MG/DOSE) 2 MG/3ML ~~LOC~~ SOPN
0.2500 mg | PEN_INJECTOR | SUBCUTANEOUS | 2 refills | Status: DC
Start: 2023-02-15 — End: 2023-04-06

## 2023-03-29 ENCOUNTER — Other Ambulatory Visit: Payer: Medicare PPO

## 2023-03-29 ENCOUNTER — Encounter: Payer: Self-pay | Admitting: Physician Assistant

## 2023-03-29 ENCOUNTER — Ambulatory Visit: Payer: Medicare PPO | Admitting: Physician Assistant

## 2023-03-29 VITALS — BP 160/80 | HR 76 | Ht 62.0 in | Wt 198.4 lb

## 2023-03-29 DIAGNOSIS — R1013 Epigastric pain: Secondary | ICD-10-CM

## 2023-03-29 DIAGNOSIS — K449 Diaphragmatic hernia without obstruction or gangrene: Secondary | ICD-10-CM

## 2023-03-29 DIAGNOSIS — K219 Gastro-esophageal reflux disease without esophagitis: Secondary | ICD-10-CM

## 2023-03-29 DIAGNOSIS — E119 Type 2 diabetes mellitus without complications: Secondary | ICD-10-CM | POA: Diagnosis not present

## 2023-03-29 DIAGNOSIS — R0609 Other forms of dyspnea: Secondary | ICD-10-CM | POA: Diagnosis not present

## 2023-03-29 DIAGNOSIS — Z8711 Personal history of peptic ulcer disease: Secondary | ICD-10-CM

## 2023-03-29 DIAGNOSIS — N83202 Unspecified ovarian cyst, left side: Secondary | ICD-10-CM

## 2023-03-29 DIAGNOSIS — R195 Other fecal abnormalities: Secondary | ICD-10-CM | POA: Diagnosis not present

## 2023-03-29 DIAGNOSIS — D649 Anemia, unspecified: Secondary | ICD-10-CM

## 2023-03-29 DIAGNOSIS — R634 Abnormal weight loss: Secondary | ICD-10-CM | POA: Diagnosis not present

## 2023-03-29 DIAGNOSIS — R1012 Left upper quadrant pain: Secondary | ICD-10-CM

## 2023-03-29 DIAGNOSIS — Z7985 Long-term (current) use of injectable non-insulin antidiabetic drugs: Secondary | ICD-10-CM | POA: Diagnosis not present

## 2023-03-29 LAB — CBC WITH DIFFERENTIAL/PLATELET
Basophils Absolute: 0.1 10*3/uL (ref 0.0–0.1)
Basophils Relative: 0.7 % (ref 0.0–3.0)
Eosinophils Absolute: 0.2 10*3/uL (ref 0.0–0.7)
Eosinophils Relative: 2 % (ref 0.0–5.0)
HCT: 39.6 % (ref 36.0–46.0)
Hemoglobin: 12.8 g/dL (ref 12.0–15.0)
Lymphocytes Relative: 18 % (ref 12.0–46.0)
Lymphs Abs: 1.8 10*3/uL (ref 0.7–4.0)
MCHC: 32.4 g/dL (ref 30.0–36.0)
MCV: 96.5 fL (ref 78.0–100.0)
Monocytes Absolute: 0.6 10*3/uL (ref 0.1–1.0)
Monocytes Relative: 6.1 % (ref 3.0–12.0)
Neutro Abs: 7.1 10*3/uL (ref 1.4–7.7)
Neutrophils Relative %: 73.2 % (ref 43.0–77.0)
Platelets: 305 10*3/uL (ref 150.0–400.0)
RBC: 4.11 Mil/uL (ref 3.87–5.11)
RDW: 15.7 % — ABNORMAL HIGH (ref 11.5–15.5)
WBC: 9.7 10*3/uL (ref 4.0–10.5)

## 2023-03-29 LAB — COMPREHENSIVE METABOLIC PANEL
ALT: 22 U/L (ref 0–35)
AST: 21 U/L (ref 0–37)
Albumin: 4.3 g/dL (ref 3.5–5.2)
Alkaline Phosphatase: 85 U/L (ref 39–117)
BUN: 30 mg/dL — ABNORMAL HIGH (ref 6–23)
CO2: 29 meq/L (ref 19–32)
Calcium: 9.2 mg/dL (ref 8.4–10.5)
Chloride: 102 meq/L (ref 96–112)
Creatinine, Ser: 1.1 mg/dL (ref 0.40–1.20)
GFR: 46.09 mL/min — ABNORMAL LOW (ref >60.00)
Glucose, Bld: 129 mg/dL — ABNORMAL HIGH (ref 70–99)
Potassium: 4.7 meq/L (ref 3.5–5.1)
Sodium: 138 meq/L (ref 135–145)
Total Bilirubin: 0.4 mg/dL (ref 0.2–1.2)
Total Protein: 6.6 g/dL (ref 6.0–8.3)

## 2023-03-29 LAB — VITAMIN B12: Vitamin B-12: >1537 pg/mL — ABNORMAL HIGH (ref 211–911)

## 2023-03-29 LAB — IBC + FERRITIN
Ferritin: 40.5 ng/mL (ref 10.0–291.0)
Iron: 59 ug/dL (ref 42–145)
Saturation Ratios: 17.3 % — ABNORMAL LOW (ref 20.0–50.0)
TIBC: 340.2 ug/dL (ref 250.0–450.0)
Transferrin: 243 mg/dL (ref 212.0–360.0)

## 2023-03-29 LAB — LIPASE: Lipase: 48 U/L (ref 11.0–59.0)

## 2023-03-29 MED ORDER — ESOMEPRAZOLE MAGNESIUM 40 MG PO CPDR: 40.0000 mg | DELAYED_RELEASE_CAPSULE | Freq: Two times a day (BID) | ORAL | 0 refills | Status: DC

## 2023-03-29 NOTE — Progress Notes (Signed)
 03/29/2023 Brittany Mcclure [id number] November 08, 1940  Referring provider: Mast, Man X, NP Primary GI doctor: Dr. Albertus  ASSESSMENT AND PLAN:  Possible Peptic Ulcer Disease/GERD/Hiatal hernia 2017 EGD with gastric ulcers 2020 EGD with 4 cm HH Reports left upper abdominal pain, similar to previous ulcer pain. History of recent illness versus ozempic  use with prolonged recovery and recent changes in bowel movements, some pale stools and weight loss.  Currently on Nexium  as needed. -Increase Nexium  to twice daily before meals for 2-3 months. -Order CT of abdomen to visualize stomach and hiatal hernia, pancrease with tan stools -EGD after cardiology clearance. - can consider GES if symptoms continue  Possible Cardiac Disease CAD risks: diabetes, chol, hypertension, maternal GM with heart disease. Reports chest discomfort, DOE, and back pain, worsening exercise capacity and fatigue. -Urgent referral to cardiology, no active chest pain at this time -Advise patient to seek immediate medical attention if chest pain or shortness of breath worsens.  Diabetes Recently switched from glipizide  to Ozempic  due to episodes of low blood sugar. Likely contributing to worsening symptoms mentioned above.  -Discuss with primary care provider about potential need to discontinue Ozempic .  Follow-up in 3 months.     Patient Care Team: Mast, Man X, NP as PCP - General (Internal Medicine) Shari Easter, MD as Consulting Physician (Orthopedic Surgery) [name], MD as Consulting Physician (Ophthalmology)  HISTORY OF PRESENT ILLNESS: 83 y.o. female with a past medical history of hypertension, GERD, type 2 diabetes CKD stage IIIa, dyslipidemia, anxiety, anemia of CKD , history of PUD, family history of colon cancer in mother, diverticulosis, history of adenomatous polyps and others listed below presents for evaluation of acid reflux and hernia.   05/18/2018 last seen in the office by Dr.  Albertus 05/22/2018 EGD and colonoscopy with Dr. Pyrtle Colonoscopy showed 3 mm polyp ascending colon diverticula sigmoid and descending colon.  Polyp was benign no recall for age Endoscopy for epigastric pain and GERD showed 4 cm hiatal hernia, normal mucosa in entire esophagus, erosive gastritis, normal duodenum.  Path with negative H. pylori no abnormal cells.  Last A1C 7.2 in Jan. HGB 11.8 ( 11.5, 11.6) normal MCV at 91 Has been on ozempic  since Oct/Nov, changed from glipizide .  She has been on fosamax  since Oct, but has been off of this since Dec.   She had 5 days of stomach flu after christmas, was weak and had to build strength up. She states bra is too tight, having a lot of epigastric discomfort, reflux, beltching and gas.   Discussed the use of AI scribe software for clinical note transcription with the patient, who gave verbal consent to proceed.  History of Present Illness   The patient, with a history of diabetes and hypertension, presents with a chief complaint of persistent upper abdominal discomfort and chest tightness. The discomfort, initially attributed to heartburn, is described as a sensation of tightness, akin to the feeling of a too-tight bra, and is associated with belching and gas, which the patient reports as unusual for them. The patient also reports a change in the color of their stool, which has become notably pale.  The patient's symptoms began following a bout of intestinal flu around Christmas, which left them feeling weak and took longer than usual to recover from. The patient also reports a decrease in appetite and a feeling of nausea, which they describe as unusual for them.  The patient has a history of an ulcer and a small hernia, diagnosed  several years ago, and reports similar discomfort in the same location as the previous ulcer. They deny any use of NSAIDs, which could potentially exacerbate these conditions.  The patient was recently switched from glipizide   to Ozempic  for their diabetes management by their primary care provider. They report that since starting the Ozempic , they have experienced a decrease in appetite and a feeling of fullness after eating small amounts. The patient also reports shortness of breath, which they attribute to their weight and age, but note that it has been worsening.  The patient has a family history of heart disease, with a grandmother who suffered a severe heart attack. They report some concern about their heart due to the chest discomfort and a recent sensation of pain in their upper back, which is different from their usual lower back problems.  The patient has been taking Nexium  for heartburn, but not on a regular basis. They report that they have been taking it more frequently recently due to the increase in their symptoms. They were also prescribed Fosamax  for bone health, but stopped taking it after a few weeks due to illness.  The patient reports a decrease in physical activity in recent months, attributing it to feeling unwell and the winter weather. They report that they have been more short of breath with exertion, such as walking around their complex or bringing in groceries.  She  reports that she has quit smoking. She has never used smokeless tobacco. She reports current alcohol use. She reports that she does not use drugs.  RELEVANT LABS AND IMAGING:  CBC    Component Value Date/Time   WBC 8.5 04/12/2022 0703   RBC 3.76 (L) 04/12/2022 0703   HGB 11.8 04/12/2022 0703   HCT 34.3 (L) 04/12/2022 0703   PLT 264 04/12/2022 0703   MCV 91.2 04/12/2022 0703   MCH 31.4 04/12/2022 0703   MCHC 34.4 04/12/2022 0703   RDW 13.7 04/12/2022 0703   LYMPHSABS 2,261 04/12/2022 0703   MONOABS 651 11/22/2016 0740   EOSABS 340 04/12/2022 0703   BASOSABS 60 04/12/2022 0703   Recent Labs    04/12/22 0703  HGB 11.8    CMP     Component Value Date/Time   NA 139 04/12/2022 0703   K 4.6 04/12/2022 0703   CL 105  04/12/2022 0703   CO2 25 04/12/2022 0703   GLUCOSE 114 (H) 04/12/2022 0703   BUN 29 (H) 04/12/2022 0703   CREATININE 1.10 (H) 04/12/2022 0703   CALCIUM 9.2 04/12/2022 0703   PROT 6.4 04/12/2022 0703   ALBUMIN 4.0 11/22/2016 0740   AST 13 04/12/2022 0703   ALT 12 04/12/2022 0703   ALKPHOS 83 11/22/2016 0740   BILITOT 0.5 04/12/2022 0703   GFRNONAA 45 (L) 06/02/2020 0715   GFRAA 52 (L) 06/02/2020 0715      Latest Ref Rng & Units 04/12/2022    7:03 AM 11/30/2021    7:05 AM 11/24/2020    7:05 AM  Hepatic Function  Total Protein 6.1 - 8.1 g/dL 6.4  6.4  6.5   AST 10 - 35 U/L 13  12  15    ALT 6 - 29 U/L 12  12  13    Total Bilirubin 0.2 - 1.2 mg/dL 0.5  0.4  0.5       Current Medications:   Current Outpatient Medications (Endocrine & Metabolic):    alendronate  (FOSAMAX ) 70 MG tablet, Take 1 tablet (70 mg total) by mouth every 7 (seven) days. Take  with a full glass of water on an empty stomach.   Semaglutide ,0.25 or 0.5MG /DOS, (OZEMPIC , 0.25 OR 0.5 MG/DOSE,) 2 MG/3ML SOPN, Inject 0.25 mg into the skin once a week.  Current Outpatient Medications (Cardiovascular):    bisoprolol -hydrochlorothiazide  (ZIAC ) 10-6.25 MG tablet, TAKE ONE TABLET BY MOUTH DAILY   Current Outpatient Medications (Analgesics):    acetaminophen  (TYLENOL ) 500 MG tablet, Take 1,000 mg by mouth every morning.   acetaminophen  (TYLENOL ) 650 MG CR tablet, Take 1,300 mg by mouth at bedtime.   allopurinol  (ZYLOPRIM ) 100 MG tablet, TAKE TWO TABLETS BY MOUTH ONCE DAILY  Current Outpatient Medications (Hematological):    cyanocobalamin  (VITAMIN B12) 1000 MCG tablet, Take 1 tablet (1,000 mcg total) by mouth daily.  Current Outpatient Medications (Other):    Accu-Chek FastClix Lancets MISC, USE AS DIRECTED.   ALPRAZolam  (XANAX ) 0.25 MG tablet, TAKE ONE TABLET BY MOUTH TWICE DAILY AS NEEDED   Biotin 5000 MCG CAPS, Take 1 capsule by mouth daily.   CALCIUM PO, Take 600 mg by mouth daily.   Cholecalciferol  (VITAMIN D3) 50  MCG (2000 UT) capsule, Take 2,000 Units by mouth daily.   CRANBERRY PO, Take by mouth.   FLUoxetine  (PROZAC ) 40 MG capsule, TAKE (1) CAPSULE DAILY.   glucose blood (ACCU-CHEK SMARTVIEW) test strip, CHECK BLOOD SUGAR 3 TIMES DAILY AS DIRECTED.   Misc Natural Products (NEURIVA PO), Take 1 capsule by mouth daily at 12 noon.   Multiple Vitamins-Minerals (PRESERVISION AREDS PO), Take 1 tablet by mouth in the morning and at bedtime.   NON FORMULARY, Beet Root 1 capsule daily   nystatin  (MYCOSTATIN /NYSTOP ) powder, Apply 1 Application topically 3 (three) times daily.   Omega-3 Fatty Acids (FISH OIL) 1000 MG CAPS, Take 1 capsule by mouth daily.   esomeprazole  (NEXIUM ) 40 MG capsule, Take 1 capsule (40 mg total) by mouth 2 (two) times daily before a meal.  Medical History:  Past Medical History:  Diagnosis Date   Allergy    Anemia    Anxiety    Arthritis    with gout   Carotid bruit    right   Cataract    bil cataracts removed   Chest pain    Chronic kidney disease    Colon polyps    Depression    Diabetes type 2, uncontrolled    Diverticulosis    Duodenitis    Exogenous obesity    Gastric ulcer    GERD (gastroesophageal reflux disease)    Hyperlipidemia    Hypertension    Metabolic syndrome    Osteopenia    Seasonal allergies    seasonal   Tubular adenoma of colon    Allergies:  Allergies  Allergen Reactions   Crestor [Rosuvastatin Calcium]     Myalgias    Elemental Sulfur     Made infection worse   Gemfibrozil  Itching   Iodine      Skin rash   Lipitor [Atorvastatin Calcium]     Myalgias    Lovastatin     myalgias   Sulfonamide Derivatives     REACTION: n\T\v   Zetia  [Ezetimibe ]     Cough, dry     Surgical History:  She  has a past surgical history that includes Foot surgery; Tubal ligation; Dilation and curettage of uterus; Colonoscopy; Polypectomy; Hand surgery (Right); uterine polyps removed; and Dilation and curettage of uterus (1997). Family History:  Her  family history includes Colon cancer in her mother; Diabetes in her maternal grandmother; Heart failure in her maternal grandmother; Hypertension  in her father; Stroke in her father.  REVIEW OF SYSTEMS  : All other systems reviewed and negative except where noted in the History of Present Illness.  PHYSICAL EXAM: BP (!) 160/80   Pulse 76   Ht 5' 2 (1.575 m)   Wt 198 lb 6.4 oz (90 kg)   BMI 36.29 kg/m  General Appearance: Well nourished, in no apparent distress. Head:   Normocephalic and atraumatic. Eyes:  sclerae anicteric,conjunctive pink  Respiratory: Respiratory effort normal, BS equal bilaterally without rales, rhonchi, wheezing. Cardio: RRR with no MRGs. Peripheral pulses intact.  Abdomen: Soft,  Obese ,active bowel sounds. mild tenderness in the entire abdomen and chest wall due to vomiting/muscular pain per patient after christmas.Without guarding and Without rebound. No masses. Rectal: Not evaluated Musculoskeletal: Full ROM, Normal gait. Without edema. Skin:  Dry and intact without significant lesions or rashes Neuro: Alert and  oriented x4;  No focal deficits. Psych:  Cooperative. Normal mood and affect.    Alan JONELLE Coombs, PA-C 12:15 PM

## 2023-03-29 NOTE — Patient Instructions (Addendum)
 Your provider has requested that you go to the basement level for lab work before leaving today. Press B on the elevator. The lab is located at the first door on the left as you exit the elevator.  Consider stopping the ozempic , talk with your primary care  Will get a CT scan  You will be contacted by [facility] in the next 2 days to arrange a CT abdomen/pelvis.  The number on your caller ID will be [phone], please answer when they call.  If you have not heard from them in 2 days please call [phone] to schedule.     Will send to cardiology, if this is negative can consider CT   Please take your proton pump inhibitor medication, nexium  40 mg TWICE a day for a month Please take this medication 30 minutes to 1 hour before meals- this makes it more effective.  Avoid spicy and acidic foods Avoid fatty foods Limit your intake of coffee, tea, alcohol, and carbonated drinks Work to maintain a healthy weight Keep the head of the bed elevated at least 3 inches with blocks or a wedge pillow if you are having any nighttime symptoms Stay upright for 2 hours after eating Avoid meals and snacks three to four hours before bedtime

## 2023-03-30 ENCOUNTER — Telehealth: Payer: Self-pay | Admitting: Physician Assistant

## 2023-03-30 ENCOUNTER — Ambulatory Visit (HOSPITAL_BASED_OUTPATIENT_CLINIC_OR_DEPARTMENT_OTHER)
Admission: RE | Admit: 2023-03-30 | Discharge: 2023-03-30 | Disposition: A | Discharge status: Home / Self Care | Payer: Medicare PPO | Source: Ambulatory Visit | Attending: Physician Assistant | Admitting: Physician Assistant

## 2023-03-30 DIAGNOSIS — R1013 Epigastric pain: Secondary | ICD-10-CM | POA: Insufficient documentation

## 2023-03-30 DIAGNOSIS — K5730 Diverticulosis of large intestine without perforation or abscess without bleeding: Secondary | ICD-10-CM | POA: Diagnosis not present

## 2023-03-30 MED ORDER — IOHEXOL 300 MG/ML  SOLN
100.0000 mL | Freq: Once | INTRAMUSCULAR | Status: AC | PRN
  Administered 2023-03-30: 100 mL via INTRAVENOUS

## 2023-03-30 NOTE — Telephone Encounter (Signed)
 Spencer from Radiology called to advise the patient has been scheduled for today and he noticed she is allergic to Iodine they would like to discuss further. [phone].

## 2023-03-30 NOTE — Telephone Encounter (Signed)
 Lm with imaging regarding patient being allergic to iodine

## 2023-03-31 ENCOUNTER — Telehealth: Payer: Self-pay

## 2023-03-31 ENCOUNTER — Other Ambulatory Visit: Payer: Self-pay | Admitting: Physician Assistant

## 2023-03-31 ENCOUNTER — Ambulatory Visit (HOSPITAL_COMMUNITY)
Admission: RE | Admit: 2023-03-31 | Discharge: 2023-03-31 | Disposition: A | Discharge status: Home / Self Care | Payer: Medicare PPO | Source: Ambulatory Visit | Attending: Physician Assistant | Admitting: Physician Assistant

## 2023-03-31 DIAGNOSIS — N83292 Other ovarian cyst, left side: Secondary | ICD-10-CM | POA: Diagnosis not present

## 2023-03-31 DIAGNOSIS — N83202 Unspecified ovarian cyst, left side: Secondary | ICD-10-CM | POA: Insufficient documentation

## 2023-03-31 DIAGNOSIS — R109 Unspecified abdominal pain: Secondary | ICD-10-CM | POA: Diagnosis not present

## 2023-03-31 DIAGNOSIS — Z8711 Personal history of peptic ulcer disease: Secondary | ICD-10-CM

## 2023-03-31 DIAGNOSIS — D649 Anemia, unspecified: Secondary | ICD-10-CM

## 2023-03-31 DIAGNOSIS — R0609 Other forms of dyspnea: Secondary | ICD-10-CM

## 2023-03-31 DIAGNOSIS — R9389 Abnormal findings on diagnostic imaging of other specified body structures: Secondary | ICD-10-CM | POA: Diagnosis not present

## 2023-03-31 DIAGNOSIS — K219 Gastro-esophageal reflux disease without esophagitis: Secondary | ICD-10-CM

## 2023-03-31 DIAGNOSIS — R1013 Epigastric pain: Secondary | ICD-10-CM

## 2023-03-31 DIAGNOSIS — R634 Abnormal weight loss: Secondary | ICD-10-CM | POA: Diagnosis not present

## 2023-03-31 NOTE — Progress Notes (Signed)
 Addendum: Reviewed and agree with assessment and management plan. Asha Grumbine, Carie Caddy, MD

## 2023-03-31 NOTE — Telephone Encounter (Signed)
 CT completed

## 2023-03-31 NOTE — Telephone Encounter (Signed)
 Patient states she is calling because she is feeling bad and she thinks it has been the Ozempic. Patient states she would like a phone call because its time to refill the medication but she would not like to.

## 2023-03-31 NOTE — Telephone Encounter (Signed)
 Discussed medication with Mast, Man X, NP. Patient is scheduled for next Thursday

## 2023-03-31 NOTE — Addendum Note (Signed)
 Addended by: Quentin Mulling on: 03/31/2023 12:35 PM   Modules accepted: Orders

## 2023-04-03 ENCOUNTER — Other Ambulatory Visit: Payer: Self-pay

## 2023-04-03 DIAGNOSIS — R9389 Abnormal findings on diagnostic imaging of other specified body structures: Secondary | ICD-10-CM

## 2023-04-03 DIAGNOSIS — N83202 Unspecified ovarian cyst, left side: Secondary | ICD-10-CM

## 2023-04-04 ENCOUNTER — Encounter (HOSPITAL_BASED_OUTPATIENT_CLINIC_OR_DEPARTMENT_OTHER): Payer: Self-pay | Admitting: Obstetrics & Gynecology

## 2023-04-04 ENCOUNTER — Other Ambulatory Visit (HOSPITAL_COMMUNITY)
Admission: RE | Admit: 2023-04-04 | Discharge: 2023-04-04 | Disposition: A | Discharge status: Home / Self Care | Payer: Medicare PPO | Source: Ambulatory Visit | Attending: Obstetrics & Gynecology | Admitting: Obstetrics & Gynecology

## 2023-04-04 ENCOUNTER — Ambulatory Visit (HOSPITAL_BASED_OUTPATIENT_CLINIC_OR_DEPARTMENT_OTHER): Payer: Medicare PPO | Admitting: Obstetrics & Gynecology

## 2023-04-04 VITALS — BP 204/74 | HR 80 | Ht 60.5 in | Wt 197.8 lb

## 2023-04-04 DIAGNOSIS — R9389 Abnormal findings on diagnostic imaging of other specified body structures: Secondary | ICD-10-CM

## 2023-04-04 DIAGNOSIS — N9489 Other specified conditions associated with female genital organs and menstrual cycle: Secondary | ICD-10-CM

## 2023-04-04 DIAGNOSIS — R935 Abnormal findings on diagnostic imaging of other abdominal regions, including retroperitoneum: Secondary | ICD-10-CM | POA: Diagnosis not present

## 2023-04-04 DIAGNOSIS — K219 Gastro-esophageal reflux disease without esophagitis: Secondary | ICD-10-CM | POA: Diagnosis not present

## 2023-04-04 NOTE — Progress Notes (Signed)
 GYNECOLOGY  VISIT  CC:   New patient/discuss ultrasound  HPI: 83 y.o. G2P2 Widowed White or Caucasian female here for discussion of ultrasound findings that were obtained on 03/31/2023.  Pt reports she was having significant epigastric discomfort but to starting Ozempic .  Ct was done showing 8cm left ovarian cystic lesion.  Transabdominal ultrasound was also obtained and this showed 6.0cm cystic left adnexal lesion.  Reviewed images personally.  Endometrial imaging not ideal but does appear to be 6mm.  Biopsy recommended.  Denies vaginal bleeding.  Discussed with pt adnexal imaging appears to show benign cyst.  Ultrasound measurement 6.0cm.  Likely this is a cystadenoma.  She really does not want surgery unless necessary.  If can be assured this is benign, she would prefer to monitor conservatively.     Past Medical History:  Diagnosis Date   Allergy    Anemia    Anxiety    Arthritis    with gout   Carotid bruit    right   Cataract    bil cataracts removed   Chest pain    Chronic kidney disease    Colon polyps    Depression    Diabetes type 2, uncontrolled    Diverticulosis    Duodenitis    Exogenous obesity    Gastric ulcer    GERD (gastroesophageal reflux disease)    Hyperlipidemia    Hypertension    Metabolic syndrome    Osteopenia    Seasonal allergies    seasonal   Tubular adenoma of colon     MEDS:   Current Outpatient Medications on File Prior to Visit  Medication Sig Dispense Refill   Accu-Chek FastClix Lancets MISC USE AS DIRECTED. 200 each 2   acetaminophen  (TYLENOL ) 500 MG tablet Take 1,000 mg by mouth every morning.     acetaminophen  (TYLENOL ) 650 MG CR tablet Take 1,300 mg by mouth at bedtime.     alendronate  (FOSAMAX ) 70 MG tablet Take 1 tablet (70 mg total) by mouth every 7 (seven) days. Take with a full glass of water on an empty stomach. 4 tablet 11   allopurinol  (ZYLOPRIM ) 100 MG tablet TAKE TWO TABLETS BY MOUTH ONCE DAILY 180 tablet 1   ALPRAZolam   (XANAX ) 0.25 MG tablet TAKE ONE TABLET BY MOUTH TWICE DAILY AS NEEDED 60 tablet 1   Biotin 5000 MCG CAPS Take 1 capsule by mouth daily.     bisoprolol -hydrochlorothiazide  (ZIAC ) 10-6.25 MG tablet TAKE ONE TABLET BY MOUTH DAILY 90 tablet 1   CALCIUM PO Take 600 mg by mouth daily.     Cholecalciferol  (VITAMIN D3) 50 MCG (2000 UT) capsule Take 2,000 Units by mouth daily.     CRANBERRY PO Take by mouth.     cyanocobalamin  (VITAMIN B12) 1000 MCG tablet Take 1 tablet (1,000 mcg total) by mouth daily. 90 tablet 3   esomeprazole  (NEXIUM ) 40 MG capsule Take 1 capsule (40 mg total) by mouth 2 (two) times daily before a meal. 180 capsule 0   FLUoxetine  (PROZAC ) 40 MG capsule TAKE (1) CAPSULE DAILY. 90 capsule 2   glipiZIDE  (GLUCOTROL ) 5 MG tablet Take 5 mg by mouth 2 (two) times daily.     glucose blood (ACCU-CHEK SMARTVIEW) test strip CHECK BLOOD SUGAR 3 TIMES DAILY AS DIRECTED. 200 strip 2   Misc Natural Products (NEURIVA PO) Take 1 capsule by mouth daily at 12 noon.     Multiple Vitamins-Minerals (PRESERVISION AREDS PO) Take 1 tablet by mouth in the morning and at bedtime.  NON FORMULARY Beet Root 1 capsule daily     nystatin  (MYCOSTATIN /NYSTOP ) powder Apply 1 Application topically 3 (three) times daily. 15 g 4   Omega-3 Fatty Acids (FISH OIL) 1000 MG CAPS Take 1 capsule by mouth daily.     Semaglutide ,0.25 or 0.5MG /DOS, (OZEMPIC , 0.25 OR 0.5 MG/DOSE,) 2 MG/3ML SOPN Inject 0.25 mg into the skin once a week. (Patient not taking: Reported on 04/04/2023) 3 mL 2   No current facility-administered medications on file prior to visit.    ALLERGIES: Crestor [rosuvastatin calcium], Elemental sulfur, Gemfibrozil , Iodine , Lipitor [atorvastatin calcium], Lovastatin, Sulfonamide derivatives, and Zetia  [ezetimibe ]  SH:  widowed, non smoker  Review of Systems  Constitutional: Negative.   Genitourinary: Negative.     PHYSICAL EXAMINATION:    BP (!) 204/74 (BP Location: Right Arm, Patient Position: Sitting,  Cuff Size: Large)   Pulse 80   Ht 5' 0.5 (1.537 m)   Wt 197 lb 12.8 oz (89.7 kg)   BMI 37.99 kg/m     General appearance: alert, cooperative and appears stated age Abdomen: soft, non-tender; bowel sounds normal; no masses,  no organomegaly Lymph:  no inguinal LAD noted  Pelvic: External genitalia:  no lesions              Urethra:  normal appearing urethra with no masses, tenderness or lesions              Bartholins and Skenes: normal                 Vagina:  atrophic mucosa with small cystocele              Cervix: no lesions              Bimanual Exam:  Uterus:  normal size, contour, position, consistency, mobility, non-tender              Adnexa: no mass, fullness, tenderness and mass seen on ultrasound not palpable on exam  Endometrial biopsy recommended.  Discussed with patient.  Verbal and written consent obtained.   Procedure:  Speculum placed.  Cervix visualized and cleansed with betadine  prep.  A single toothed tenaculum was applied to the anterior lip of the cervix.  Endometrial pipelle was advanced through the cervix into the endometrial cavity without difficulty.  Pipelle passed to 6.5cm.  Suction applied and pipelle removed with scant tissue sample obtained.  Second pass performed.  Tenculum removed.  No bleeding noted.  Patient tolerated procedure well.   Chaperone, Bascom Kotyk, CMA, was present for exam.  Assessment/Plan: 1. Adnexal mass (Primary) - will check ca-125.  If normal, repeat ultrasound 3-4 months.  If stable, can follow conservatively.  If not, will proceed with surgical planning - CA 125 - US  PELVIS TRANSVAGINAL NON-OB (TV ONLY); Future  2. Abnormal ultrasound of endometrium - endometrial biopsy obtained today.  Pathology results will be called to pt - Surgical pathology( [place]/ [facility])  3. Gastroesophageal reflux disease, unspecified whether esophagitis present - discussed with pt common side effect of alendronate  is GERD.  Given more recent  symptoms and only starting this late last year, it could be related.  Encouraged her to discuss with provider.

## 2023-04-05 LAB — CA 125: Cancer Antigen (CA) 125: 9.3 U/mL (ref 0.0–38.1)

## 2023-04-06 ENCOUNTER — Encounter: Payer: Self-pay | Admitting: Nurse Practitioner

## 2023-04-06 ENCOUNTER — Non-Acute Institutional Stay: Payer: Medicare PPO | Admitting: Nurse Practitioner

## 2023-04-06 VITALS — BP 138/80 | HR 71 | Temp 97.0°F | Ht 60.6 in | Wt 200.0 lb

## 2023-04-06 DIAGNOSIS — M1A00X0 Idiopathic chronic gout, unspecified site, without tophus (tophi): Secondary | ICD-10-CM

## 2023-04-06 DIAGNOSIS — K219 Gastro-esophageal reflux disease without esophagitis: Secondary | ICD-10-CM

## 2023-04-06 DIAGNOSIS — N183 Chronic kidney disease, stage 3 unspecified: Secondary | ICD-10-CM

## 2023-04-06 DIAGNOSIS — E785 Hyperlipidemia, unspecified: Secondary | ICD-10-CM | POA: Diagnosis not present

## 2023-04-06 DIAGNOSIS — I10 Essential (primary) hypertension: Secondary | ICD-10-CM

## 2023-04-06 DIAGNOSIS — E1169 Type 2 diabetes mellitus with other specified complication: Secondary | ICD-10-CM | POA: Diagnosis not present

## 2023-04-06 DIAGNOSIS — F419 Anxiety disorder, unspecified: Secondary | ICD-10-CM

## 2023-04-06 DIAGNOSIS — M5030 Other cervical disc degeneration, unspecified cervical region: Secondary | ICD-10-CM

## 2023-04-06 DIAGNOSIS — E669 Obesity, unspecified: Secondary | ICD-10-CM | POA: Diagnosis not present

## 2023-04-06 LAB — SURGICAL PATHOLOGY

## 2023-04-06 MED ORDER — VITAMIN B-12 1000 MCG PO TABS: 1000.0000 ug | ORAL_TABLET | ORAL | Status: DC

## 2023-04-06 NOTE — Assessment & Plan Note (Signed)
Bun/creat 30/1.10 03/29/23

## 2023-04-06 NOTE — Assessment & Plan Note (Signed)
Blood pressure is controlled,  takes Beets capsule,  Ziac 10/6.25mg qd.    

## 2023-04-06 NOTE — Assessment & Plan Note (Signed)
No acute gout flare ups, uric acid 4.9 01/01/19, on Allopurinol

## 2023-04-06 NOTE — Assessment & Plan Note (Signed)
takes Omega 3, allergic to statin. LDL 209 04/12/22

## 2023-04-06 NOTE — Progress Notes (Signed)
Location:   Clinic [facility]   Place of Service:  Clinic (12) Provider: Chipper Oman NP  Code Status: DNR Goals of Care:     04/06/2023    9:05 AM  Advanced Directives  Does Patient Have a Medical Advance Directive? Yes  Type of Estate agent of [place];Living will;Out of facility DNR (pink MOST or yellow form)  Does patient want to make changes to medical advance directive? No - Patient declined  Copy of Healthcare Power of Attorney in Chart? Yes - validated most recent copy scanned in chart (See row information)  Pre-existing out of facility DNR order (yellow form or pink MOST form) Yellow form placed in chart (order not valid for inpatient use)     Chief Complaint  Patient presents with   Medical Management of Chronic Issues    Routine visit. Discuss need for mammogram, diabetic kidney evaluation, and A1c. B/P increases when patient gets upset, would like to discuss     HPI: Patient is a 83 y.o. female seen today for medical management of chronic diseases.    Adnexal mass, likely cystadenoma, conservative management. CT 8cm left ovarian cystic lesion, [place] 6.0 cystic left adnexal lesion, biopsy pending, followed by GYN T2DM, didn't tolerated Metformin-bloated, Ozempic-feelign bed, resumed Glipizide, Hgb A1c 6.8 11/30/21<<7.2 04/12/22             No acute gout flare ups, uric acid 4.9 01/01/19, on Allopurinol             Depression,  on Alprazolam 0.25mg  bid prn, on Prozac CT 06/23/21 unremarkable. TSH 1.99 11/30/21             GERD, stable on Nexium             HTN, takes Beets capsule,  Ziac 10/6.25mg  qd.              LDL, takes Omega 3, allergic to statin. LDL 209 04/12/22             DDD cervical and lumbar spine: s/p injs, pain is controlled. Vit D 37 11/30/21             CKD Bun/creat 30/1.10 03/29/23             Anemia, Vit B12 >1537 03/28/22, reduce Vit B12 supplement 2x/wk,   Hgb 12.8 03/29/23    Past Medical History:  Diagnosis Date   Allergy    Anemia     Anxiety    Arthritis    with gout   Carotid bruit    right   Cataract    bil cataracts removed   Chest pain    Chronic kidney disease    Colon polyps    Depression    Diabetes type 2, uncontrolled    Diverticulosis    Duodenitis    Exogenous obesity    Gastric ulcer    GERD (gastroesophageal reflux disease)    Hyperlipidemia    Hypertension    Metabolic syndrome    Osteopenia    Seasonal allergies    seasonal   Tubular adenoma of colon     Past Surgical History:  Procedure Laterality Date   COLONOSCOPY     11/90,11/91,1994,2000,2010   DILATION AND CURETTAGE OF UTERUS     DILATION AND CURETTAGE OF UTERUS  1997   FOOT SURGERY     x 2   HAND SURGERY Right    Carpel Tunnel   POLYPECTOMY     TUBAL LIGATION  uterine polyps removed     x2    Allergies  Allergen Reactions   Crestor [Rosuvastatin Calcium]     Myalgias    Elemental Sulfur     Made infection worse   Gemfibrozil Itching   Iodine     Skin rash   Lipitor [Atorvastatin Calcium]     Myalgias    Lovastatin     myalgias   Sulfonamide Derivatives     REACTION: n\T\v   Zetia [Ezetimibe]     Cough, dry    Allergies as of 04/06/2023       Reactions   Crestor [rosuvastatin Calcium]    Myalgias   Elemental Sulfur    Made infection worse   Gemfibrozil Itching   Iodine    Skin rash   Lipitor [atorvastatin Calcium]    Myalgias   Lovastatin    myalgias   Sulfonamide Derivatives    REACTION: n\T\v   Zetia [ezetimibe]    Cough, dry        Medication List        Accurate as of April 06, 2023 11:59 PM. If you have any questions, ask your nurse or doctor.          STOP taking these medications    Ozempic (0.25 or 0.5 MG/DOSE) 2 MG/3ML Sopn Generic drug: Semaglutide(0.25 or 0.5MG /DOS) Stopped by: Kunal Levario X Abdirahman Chittum       TAKE these medications    Accu-Chek FastClix Lancets Misc USE AS DIRECTED.   Accu-Chek SmartView test strip Generic drug: glucose blood CHECK BLOOD SUGAR 3  TIMES DAILY AS DIRECTED.   acetaminophen 500 MG tablet Commonly known as: TYLENOL Take 1,000 mg by mouth every morning.   acetaminophen 650 MG CR tablet Commonly known as: TYLENOL Take 1,300 mg by mouth at bedtime.   alendronate 70 MG tablet Commonly known as: Fosamax Take 1 tablet (70 mg total) by mouth every 7 (seven) days. Take with a full glass of water on an empty stomach.   allopurinol 100 MG tablet Commonly known as: ZYLOPRIM TAKE TWO TABLETS BY MOUTH ONCE DAILY   ALPRAZolam 0.25 MG tablet Commonly known as: XANAX TAKE ONE TABLET BY MOUTH TWICE DAILY AS NEEDED   Biotin 5000 MCG Caps Take 1 capsule by mouth daily.   bisoprolol-hydrochlorothiazide 10-6.25 MG tablet Commonly known as: ZIAC TAKE ONE TABLET BY MOUTH DAILY   CALCIUM PO Take 600 mg by mouth daily.   CRANBERRY PO Take by mouth.   cyanocobalamin 1000 MCG tablet Commonly known as: VITAMIN B12 Take 1 tablet (1,000 mcg total) by mouth 2 (two) times a week. What changed: when to take this Changed by: Cosmo Tetreault X Oakley Kossman   esomeprazole 40 MG capsule Commonly known as: NEXIUM Take 1 capsule (40 mg total) by mouth 2 (two) times daily before a meal.   Fish Oil 1000 MG Caps Take 1 capsule by mouth daily.   FLUoxetine 40 MG capsule Commonly known as: PROZAC TAKE (1) CAPSULE DAILY.   glipiZIDE 5 MG tablet Commonly known as: GLUCOTROL Take 5 mg by mouth 2 (two) times daily.   NEURIVA PO Take 1 capsule by mouth daily at 12 noon.   NON FORMULARY Beet Root 1 capsule daily   nystatin powder Commonly known as: MYCOSTATIN/NYSTOP Apply 1 Application topically 3 (three) times daily.   PRESERVISION AREDS PO Take 1 tablet by mouth in the morning and at bedtime.   Vitamin D3 50 MCG (2000 UT) capsule Take 2,000 Units by mouth daily.  Review of Systems:  Review of Systems  Constitutional:  Negative for appetite change, fatigue and fever.  HENT:  Positive for hearing loss. Negative for congestion and  voice change.   Respiratory:  Negative for cough, chest tightness, shortness of breath and wheezing.   Cardiovascular:  Negative for leg swelling.  Gastrointestinal:  Negative for abdominal pain and constipation.  Genitourinary:  Positive for frequency. Negative for difficulty urinating, dysuria, hematuria and urgency.       1-2x/night  Musculoskeletal:  Positive for arthralgias, back pain and gait problem.  Skin:  Negative for color change.  Neurological:  Positive for light-headedness. Negative for facial asymmetry and weakness.  Psychiatric/Behavioral:  Negative for behavioral problems and sleep disturbance. The patient is not nervous/anxious.     Health Maintenance  Topic Date Due   MAMMOGRAM  11/20/2020   HEMOGLOBIN A1C  10/11/2022   Diabetic kidney evaluation - Urine ACR  04/15/2023   FOOT EXAM  04/08/2023   Medicare Annual Wellness (AWV)  11/02/2023   Diabetic kidney evaluation - eGFR measurement  03/28/2024   DTaP/Tdap/Td (2 - Td or Tdap) 03/02/2025   INFLUENZA VACCINE  Completed   DEXA SCAN  Completed   COVID-19 Vaccine  Completed   Zoster Vaccines- Shingrix  Completed   HPV VACCINES  Aged Out   Pneumonia Vaccine 35+ Years old  Discontinued   OPHTHALMOLOGY EXAM  Discontinued    Physical Exam: Vitals:   04/06/23 0902 04/06/23 1330  BP: (!) 138/90 138/80  Pulse: 71   Temp: (!) 97 F (36.1 C)   TempSrc: Temporal   SpO2: 98%   Weight: 200 lb (90.7 kg)   Height: 5' 0.6" (1.539 m)    Body mass index is 38.29 kg/m. Physical Exam Constitutional:      Appearance: Normal appearance.  HENT:     Head: Normocephalic.     Comments:  There is indented line through forehead extended to the left parietal region, no apparent scar,     Nose: Nose normal.     Mouth/Throat:     Mouth: Mucous membranes are moist.  Eyes:     Extraocular Movements: Extraocular movements intact.     Conjunctiva/sclera: Conjunctivae normal.     Pupils: Pupils are equal, round, and reactive to  light.  Cardiovascular:     Rate and Rhythm: Normal rate and regular rhythm.     Heart sounds: No murmur heard.    Comments: Weak DP pulse left>>left Pulmonary:     Effort: Pulmonary effort is normal.     Breath sounds: No rales.  Abdominal:     General: Bowel sounds are normal.     Palpations: Abdomen is soft.     Tenderness: There is no abdominal tenderness.  Musculoskeletal:     Cervical back: Normal range of motion and neck supple.     Right lower leg: No edema.     Left lower leg: No edema.     Comments: Not new, neck pain, lower back pain radiating to the left buttock/thigh/knee. Left 2nd hammer toe.   Skin:    General: Skin is warm and dry.  Neurological:     General: No focal deficit present.     Mental Status: She is alert and oriented to person, place, and time. Mental status is at baseline.     Gait: Gait normal.  Psychiatric:        Mood and Affect: Mood normal.        Behavior: Behavior normal.  Thought Content: Thought content normal.        Judgment: Judgment normal.     Labs reviewed: Basic Metabolic Panel: Recent Labs    04/12/22 0703 03/29/23 1105  NA 139 138  K 4.6 4.7  CL 105 102  CO2 25 29  GLUCOSE 114* 129*  BUN 29* 30*  CREATININE 1.10* 1.10  CALCIUM 9.2 9.2   Liver Function Tests: Recent Labs    04/12/22 0703 03/29/23 1105  AST 13 21  ALT 12 22  ALKPHOS  --  85  BILITOT 0.5 0.4  PROT 6.4 6.6  ALBUMIN  --  4.3   Recent Labs    03/29/23 1105  LIPASE 48.0   No results for input(s): "AMMONIA" in the last 8760 hours. CBC: Recent Labs    04/12/22 0703 03/29/23 1105  WBC 8.5 9.7  NEUTROABS 5,245 7.1  HGB 11.8 12.8  HCT 34.3* 39.6  MCV 91.2 96.5  PLT 264 305.0   Lipid Panel: Recent Labs    04/12/22 0703  CHOL 321*  HDL 47*  LDLCALC 209*  TRIG 381*  CHOLHDL 6.8*   Lab Results  Component Value Date   HGBA1C 7.2 (H) 04/12/2022    Procedures since last visit: US PELVIS (TRANSABDOMINAL ONLY) Result Date:  03/31/2023 CLINICAL DATA:  Abdominal pain and weight loss. Left adnexal cyst seen on recent CT. EXAM: TRANSABDOMINAL ULTRASOUND OF PELVIS TECHNIQUE: Transabdominal ultrasound examination of the pelvis was performed including evaluation of the uterus, ovaries, adnexal regions, and pelvic cul-de-sac. COMPARISON:  CT abdomen pelvis dated 03/30/2023. FINDINGS: Evaluation is limited as the patient refused transvaginal imaging. Uterus Measurements: 6.1 x 3.5 x 5.1 cm = volume: 58 mL. No fibroids or other mass visualized. Endometrium Thickness: 7 mm. The endometrium is poorly visualized and suboptimally evaluated. The endometrium is however thickened for a postmenopausal female and may represent hyperplasia, polyp, or neoplasm. Right ovary Measurements: 2.7 x 1.5 x 1.8 cm = volume: 3.8 mL. Normal appearance/no adnexal mass. Left ovary Measurements: 7.4 x 6.9 x 8.8 cm = volume: 234 mL. There is a simple appearing cyst in the left ovary measuring 5.7 x 6.0 x 6.7 cm corresponding to the cyst seen on the CT. Other findings No abnormal free fluid. IMPRESSION: 1. Thickened endometrium. Gynecology referral and further evaluation with hysteroscopy is recommended. 2. Simple appearing left adnexal cyst. Follow-up as per gynecology recommendation. Electronically Signed   By: Elgie Collard M.D.   On: 03/31/2023 16:41   CT ABDOMEN PELVIS W CONTRAST Result Date: 03/30/2023 CLINICAL DATA:  Left lower quadrant and epigastric abdominal pain. EXAM: CT ABDOMEN AND PELVIS WITH CONTRAST TECHNIQUE: Multidetector CT imaging of the abdomen and pelvis was performed using the standard protocol following bolus administration of intravenous contrast. RADIATION DOSE REDUCTION: This exam was performed according to the departmental dose-optimization program which includes automated exposure control, adjustment of the mA and/or kV according to patient size and/or use of iterative reconstruction technique. CONTRAST:  OMNIPAQUE IOHEXOL 300 MG/ML   SOLN COMPARISON:  None Available. FINDINGS: Lower chest: The visualized lung bases are clear. No intra-abdominal free air or free fluid. Hepatobiliary: The liver is unremarkable. No biliary dilatation. The gallbladder is unremarkable. Pancreas: Unremarkable. No pancreatic ductal dilatation or surrounding inflammatory changes. Spleen: Normal in size without focal abnormality. Adrenals/Urinary Tract: The adrenal glands are unremarkable. There is mild bilateral renal parenchyma atrophy and cortical scarring. Subcentimeter bilateral renal hypodense lesions are too small to characterize, likely cysts. There is no hydronephrosis on either side. There  is symmetric enhancement and excretion of contrast by both kidneys. The visualized ureters and urinary bladder unremarkable. Stomach/Bowel: There is sigmoid diverticulosis. There is no bowel obstruction or active inflammation. The appendix is normal. Vascular/Lymphatic: Mild aortoiliac atherosclerotic disease. The IVC is unremarkable. No portal venous gas. There is no adenopathy. Reproductive: The uterus is anteverted. The right ovary is unremarkable. There is an 8 cm left adnexal cyst. Because this lesion is not adequately characterized, prompt [place] is recommended for further evaluation. Note: This recommendation does not apply to premenarchal patients and to those with increased risk (genetic, family history, elevated tumor markers or other high-risk factors) of ovarian cancer. Reference: JACR 2020 Feb; 17(2):248-254 Other: None Musculoskeletal: Osteopenia with degenerative changes of the spine. No acute osseous pathology. IMPRESSION: 1. No acute intra-abdominal or pelvic pathology. 2. Sigmoid diverticulosis. No bowel obstruction. Normal appendix. 3. An 8 cm left adnexal cyst. Further evaluation with pelvic ultrasound is recommended. 4.  Aortic Atherosclerosis (ICD10-I70.0). Electronically Signed   By: Elgie Collard M.D.   On: 03/30/2023 17:22     Assessment/Plan  Type 2 diabetes mellitus with obesity (HCC) didn't tolerated Metformin-bloated, Ozempic-feelign bed, resumed Glipizide, Hgb A1c 6.8 11/30/21<<7.2 04/12/22  Gout  No acute gout flare ups, uric acid 4.9 01/01/19, on Allopurinol  Anxiety on Alprazolam 0.25mg  bid prn, on Prozac CT 06/23/21 unremarkable. TSH 1.99 11/30/21  Chronic GERD  stable on Nexium  Essential hypertension Blood pressure is controlled,  takes Beets capsule,  Ziac 10/6.25mg  qd.   Dyslipidemia takes Omega 3, allergic to statin. LDL 209 04/12/22  DDD (degenerative disc disease), cervical cervical and lumbar spine: s/p injs, pain is controlled. Vit D 37 11/30/21  CKD (chronic kidney disease) stage 3, GFR 30-59 ml/min (HCC) Bun/creat 30/1.10 03/29/23  Anemia due to chronic kidney disease Vit B12 >1537 03/28/22, reduce Vit B12 supplement 2x/wk,   Hgb 12.8 03/29/23   Labs/tests ordered:  Hgb A1c urine ACR  Next appt:  3 months.

## 2023-04-06 NOTE — Assessment & Plan Note (Signed)
Vit B12 >1537 03/28/22, reduce Vit B12 supplement 2x/wk,   Hgb 12.8 03/29/23

## 2023-04-06 NOTE — Assessment & Plan Note (Signed)
cervical and lumbar spine: s/p injs, pain is controlled. Vit D 37 11/30/21

## 2023-04-06 NOTE — Assessment & Plan Note (Signed)
on Alprazolam 0.25mg  bid prn, on Prozac CT 06/23/21 unremarkable. TSH 1.99 11/30/21

## 2023-04-06 NOTE — Assessment & Plan Note (Signed)
stable on Nexium

## 2023-04-06 NOTE — Assessment & Plan Note (Signed)
didn't tolerated Metformin-bloated, Ozempic-feelign bed, resumed Glipizide, Hgb A1c 6.8 11/30/21<<7.2 04/12/22

## 2023-04-07 ENCOUNTER — Encounter: Payer: Self-pay | Admitting: Nurse Practitioner

## 2023-04-12 ENCOUNTER — Other Ambulatory Visit: Payer: Self-pay | Admitting: Nurse Practitioner

## 2023-04-14 ENCOUNTER — Non-Acute Institutional Stay: Payer: Medicare PPO | Admitting: Sports Medicine

## 2023-04-14 ENCOUNTER — Encounter: Payer: Self-pay | Admitting: Sports Medicine

## 2023-04-14 VITALS — BP 120/78 | HR 81 | Temp 96.9°F | Resp 17 | Ht 60.6 in | Wt 197.4 lb

## 2023-04-14 DIAGNOSIS — R051 Acute cough: Secondary | ICD-10-CM | POA: Diagnosis not present

## 2023-04-14 DIAGNOSIS — R0989 Other specified symptoms and signs involving the circulatory and respiratory systems: Secondary | ICD-10-CM | POA: Diagnosis not present

## 2023-04-14 MED ORDER — BENZONATATE 200 MG PO CAPS: 200.0000 mg | ORAL_CAPSULE | Freq: Two times a day (BID) | ORAL | 0 refills | Status: DC | PRN

## 2023-04-14 NOTE — Progress Notes (Signed)
Careteam: Patient Care Team: Mast, Man X, NP as PCP - General (Internal Medicine) Bradly Bienenstock, MD as Consulting Physician (Orthopedic Surgery) Janet Berlin, MD as Consulting Physician (Ophthalmology)  PLACE OF SERVICE:  [facility]  Advanced Directive information Does Patient Have a Medical Advance Directive?: Yes, Type of Advance Directive: Healthcare Power of [place];Living will;Out of facility DNR (pink MOST or yellow form), Does patient want to make changes to medical advance directive?: No - Patient declined  Allergies  Allergen Reactions   Crestor [Rosuvastatin Calcium]     Myalgias    Elemental Sulfur     Made infection worse   Gemfibrozil Itching   Iodine     Skin rash   Lipitor [Atorvastatin Calcium]     Myalgias    Lovastatin     myalgias   Sulfonamide Derivatives     REACTION: n\T\v   Zetia [Ezetimibe]     Cough, dry    Chief Complaint  Patient presents with   Acute Visit    Patient complains of runny nose, cough, congestion, sneezing, sore throat, fatigue, and chills. Patient symptoms started Sunday 04/09/2023.     Discussed the use of AI scribe software for clinical note transcription with the patient, who gave verbal consent to proceed.  History of Present Illness   The patient presented to the clinic with symptoms suggestive of an upper respiratory infection, which she believed to be the flu. The onset of symptoms was on the preceding Sunday, beginning with nasal congestion and frequent sneezing. The patient's condition progressed to include a runny nose and coughing. The patient described the cough as persistent and productive, but was unable to expectorate the sputum.  The patient also reported a sore throat, which she described as feeling like a knife sticking in her.   She attempted to alleviate the discomfort by gargling with salt water but only had a small amount of salt available for a single treatment.  The patient's symptoms have been severe  enough to keep her mostly bedridden. She reported that her nasal discharge has transitioned from clear to yellowish. Despite the nasal symptoms, the patient denied any pain in the sinus areas or ears. She also denied any fever, but did not have a thermometer to confirm this.  The patient reported feeling a sensation of swelling in her throat upon waking, which she felt obstructed her airway, but denied any significant breathing difficulties. She also denied any gastrointestinal symptoms such as nausea, vomiting, or diarrhea.  The patient reported body aches associated with her illness, which have improved over the past two to three days. She has been managing her symptoms with over-the-counter cough drops and Benadryl, which she believes has been helpful. The patient denied any history of asthma or COPD.     Review of Systems:  Review of Systems  Constitutional:  Negative for chills and fever.  HENT:  Positive for congestion and sore throat. Negative for ear pain and sinus pain.   Eyes:  Negative for double vision.  Respiratory:  Positive for cough. Negative for sputum production and shortness of breath.   Cardiovascular:  Negative for chest pain, palpitations and leg swelling.  Gastrointestinal:  Negative for abdominal pain, heartburn and nausea.  Genitourinary:  Negative for dysuria, frequency and hematuria.  Musculoskeletal:  Negative for falls and myalgias.  Neurological:  Negative for dizziness, sensory change and focal weakness.   Negative unless indicated in HPI.   Past Medical History:  Diagnosis Date   Allergy  Anemia    Anxiety    Arthritis    with gout   Carotid bruit    right   Cataract    bil cataracts removed   Chest pain    Chronic kidney disease    Colon polyps    Depression    Diabetes type 2, uncontrolled    Diverticulosis    Duodenitis    Exogenous obesity    Gastric ulcer    GERD (gastroesophageal reflux disease)    Hyperlipidemia    Hypertension     Metabolic syndrome    Osteopenia    Seasonal allergies    seasonal   Tubular adenoma of colon    Past Surgical History:  Procedure Laterality Date   COLONOSCOPY     11/90,11/91,1994,2000,2010   DILATION AND CURETTAGE OF UTERUS     DILATION AND CURETTAGE OF UTERUS  1997   FOOT SURGERY     x 2   HAND SURGERY Right    Carpel Tunnel   POLYPECTOMY     TUBAL LIGATION     uterine polyps removed     x2   Social History:   reports that she has quit smoking. She has never used smokeless tobacco. She reports current alcohol use. She reports that she does not use drugs.  Family History  Problem Relation Age of Onset   Colon cancer Mother    Stroke Father    Hypertension Father    Heart failure Maternal Grandmother    Diabetes Maternal Grandmother        ?   Rectal cancer Neg Hx    Stomach cancer Neg Hx    Esophageal cancer Neg Hx    Pancreatic cancer Neg Hx     Medications: Patient's Medications  New Prescriptions   No medications on file  Previous Medications   ACCU-CHEK FASTCLIX LANCETS MISC    USE AS DIRECTED.   ACETAMINOPHEN (TYLENOL) 500 MG TABLET    Take 1,000 mg by mouth every morning.   ACETAMINOPHEN (TYLENOL) 650 MG CR TABLET    Take 1,300 mg by mouth at bedtime.   ALLOPURINOL (ZYLOPRIM) 100 MG TABLET    TAKE TWO TABLETS BY MOUTH ONCE DAILY   ALPRAZOLAM (XANAX) 0.25 MG TABLET    TAKE ONE TABLET BY MOUTH TWICE DAILY AS NEEDED   BISOPROLOL-HYDROCHLOROTHIAZIDE (ZIAC) 10-6.25 MG TABLET    TAKE ONE TABLET BY MOUTH DAILY   CALCIUM PO    Take 600 mg by mouth daily.   CHOLECALCIFEROL (VITAMIN D3) 50 MCG (2000 UT) CAPSULE    Take 2,000 Units by mouth daily.   CYANOCOBALAMIN (VITAMIN B12) 1000 MCG TABLET    Take 1 tablet (1,000 mcg total) by mouth 2 (two) times a week.   ESOMEPRAZOLE (NEXIUM) 40 MG CAPSULE    Take 1 capsule (40 mg total) by mouth 2 (two) times daily before a meal.   FLUOXETINE (PROZAC) 40 MG CAPSULE    TAKE (1) CAPSULE DAILY.   GLIPIZIDE (GLUCOTROL) 5 MG  TABLET    Take 5 mg by mouth 2 (two) times daily.   GLUCOSE BLOOD (ACCU-CHEK SMARTVIEW) TEST STRIP    CHECK BLOOD SUGAR 3 TIMES DAILY AS DIRECTED.   MULTIPLE VITAMINS-MINERALS (PRESERVISION AREDS PO)    Take 1 tablet by mouth in the morning and at bedtime.   NYSTATIN (MYCOSTATIN/NYSTOP) POWDER    Apply 1 Application topically as needed.   OMEGA-3 FATTY ACIDS (FISH OIL) 1000 MG CAPS    Take 1 capsule by mouth  daily.  Modified Medications   No medications on file  Discontinued Medications   ALENDRONATE (FOSAMAX) 70 MG TABLET    Take 1 tablet (70 mg total) by mouth every 7 (seven) days. Take with a full glass of water on an empty stomach.   BIOTIN 5000 MCG CAPS    Take 1 capsule by mouth daily.   CRANBERRY PO    Take by mouth.   MISC NATURAL PRODUCTS (NEURIVA PO)    Take 1 capsule by mouth daily at 12 noon.   NON FORMULARY    Beet Root 1 capsule daily   NYSTATIN (MYCOSTATIN/NYSTOP) POWDER    Apply 1 Application topically 3 (three) times daily.    Physical Exam: Vitals:   04/14/23 0959  BP: 120/78  Pulse: 81  Resp: 17  Temp: (!) 96.9 F (36.1 C)  SpO2: 97%  Weight: 197 lb 6.4 oz (89.5 kg)  Height: 5' 0.6" (1.539 m)   Body mass index is 37.79 kg/m. BP Readings from Last 3 Encounters:  04/14/23 120/78  04/06/23 138/80  04/04/23 (!) 204/74   Wt Readings from Last 3 Encounters:  04/14/23 197 lb 6.4 oz (89.5 kg)  04/06/23 200 lb (90.7 kg)  04/04/23 197 lb 12.8 oz (89.7 kg)    Physical Exam Constitutional:      Appearance: Normal appearance.  HENT:     Head: Normocephalic and atraumatic.     Nose: Congestion present.     Comments: No sinus tenderness    Mouth/Throat:     Pharynx: Posterior oropharyngeal erythema present.  Cardiovascular:     Rate and Rhythm: Normal rate and regular rhythm.  Pulmonary:     Effort: Pulmonary effort is normal. No respiratory distress.     Breath sounds: Normal breath sounds. No wheezing.  Abdominal:     General: Bowel sounds are normal.  There is no distension.     Tenderness: There is no abdominal tenderness. There is no guarding or rebound.     Comments:    Musculoskeletal:        General: No swelling or tenderness.  Neurological:     Mental Status: She is alert. Mental status is at baseline.     Sensory: No sensory deficit.     Motor: No weakness.     Labs reviewed: Basic Metabolic Panel: Recent Labs    03/29/23 1105  NA 138  K 4.7  CL 102  CO2 29  GLUCOSE 129*  BUN 30*  CREATININE 1.10  CALCIUM 9.2   Liver Function Tests: Recent Labs    03/29/23 1105  AST 21  ALT 22  ALKPHOS 85  BILITOT 0.4  PROT 6.6  ALBUMIN 4.3   Recent Labs    03/29/23 1105  LIPASE 48.0   No results for input(s): "AMMONIA" in the last 8760 hours. CBC: Recent Labs    03/29/23 1105  WBC 9.7  NEUTROABS 7.1  HGB 12.8  HCT 39.6  MCV 96.5  PLT 305.0   Lipid Panel: No results for input(s): "CHOL", "HDL", "LDLCALC", "TRIG", "CHOLHDL", "LDLDIRECT" in the last 8760 hours. TSH: No results for input(s): "TSH" in the last 8760 hours. A1C: Lab Results  Component Value Date   HGBA1C 7.2 (H) 04/12/2022     Assessment/Plan 1. Acute cough (Primary) Lungs clear  No sinus tenderness Post pharyngeal wall erythema + Will start tessalon  - benzonatate (TESSALON) 200 MG capsule; Take 1 capsule (200 mg total) by mouth 2 (two) times daily as needed for cough.  Dispense: 20 capsule; Refill: 0  2.  Upper Respiratory Infection Symptoms started on Sunday with nasal congestion, sneezing, runny nose, cough, and body aches. No fever, no ear pain, no abdominal pain, no nausea, no diarrhea, no rash. No history of asthma or COPD. Lungs clear on auscultation.  -Order flu and COVID tests- neg   Other orders - nystatin (MYCOSTATIN/NYSTOP) powder; Apply 1 Application topically as needed.   30 min Total time spent for obtaining history,  performing a medically appropriate examination and evaluation, reviewing the tests,ordering  tests,   documenting clinical information in the electronic or other health record, care coordination (not separately reported)

## 2023-05-12 ENCOUNTER — Other Ambulatory Visit: Payer: Self-pay | Admitting: Nurse Practitioner

## 2023-05-12 NOTE — Telephone Encounter (Signed)
Patient is requesting a refill of the following medications: Requested Prescriptions   Pending Prescriptions Disp Refills   ALPRAZolam (XANAX) 0.25 MG tablet [Pharmacy Med Name: alprazolam 0.25 mg tablet] 60 tablet 1    Sig: TAKE ONE TABLET BY MOUTH TWICE DAILY AS NEEDED   glipiZIDE (GLUCOTROL) 5 MG tablet [Pharmacy Med Name: glipizide 5 mg tablet] 180 tablet 1    Sig: Take 1 tablet (5 mg total) by mouth 2 (two) times daily before a meal.   FLUoxetine (PROZAC) 40 MG capsule [Pharmacy Med Name: fluoxetine 40 mg capsule] 90 capsule 1    Sig: TAKE ONE CAPSULE BY MOUTH EVERY DAY.    Date of last refill: 07/11/22  Refill amount: 60 tab/ 1 refill  Treatment agreement date: n/A

## 2023-05-22 NOTE — Progress Notes (Deleted)
 CARDIOLOGY CONSULT NOTE       Patient ID: Brittany Mcclure MRN: [id number] DOB/AGE: [birth year] 83 y.o.  Admit date: (Not on file) Referring Physician: [facility] Primary Physician: Mast, Man X, NP Primary Cardiologist: New Reason for Consultation: Chest pain and dyspnea  Active Problems:   * No active hospital problems. *   HPI:  83 y.o. referred by Quentin Mulling for chest pain and dyspnea Sees her for GI issues concerning PUD, GERD, hiatal hernia. Wanted to do EGD after cardiac clearance She is diabetic , with HLD and HTN, Describes chest discomfort, dyspnea and back pain worsening functional status and fatigue   She has no history of cardiac issues. No arrhythmias, CHF, or CAD or other vascular dx  ***  ROS All other systems reviewed and negative except as noted above  Past Medical History:  Diagnosis Date   Allergy    Anemia    Anxiety    Arthritis    with gout   Carotid bruit    right   Cataract    bil cataracts removed   Chest pain    Chronic kidney disease    Colon polyps    Depression    Diabetes type 2, uncontrolled    Diverticulosis    Duodenitis    Exogenous obesity    Gastric ulcer    GERD (gastroesophageal reflux disease)    Hyperlipidemia    Hypertension    Metabolic syndrome    Osteopenia    Seasonal allergies    seasonal   Tubular adenoma of colon     Family History  Problem Relation Age of Onset   Colon cancer Mother    Stroke Father    Hypertension Father    Heart failure Maternal Grandmother    Diabetes Maternal Grandmother        ?   Rectal cancer Neg Hx    Stomach cancer Neg Hx    Esophageal cancer Neg Hx    Pancreatic cancer Neg Hx     Social History   Socioeconomic History   Marital status: Widowed    Spouse name: Not on file   Number of children: 2   Years of education: Not on file   Highest education level: Not on file  Occupational History   Occupation: retired    Associate Professor: RETIRED  Tobacco Use   Smoking status:  Former   Smokeless tobacco: Never   Tobacco comments:    Late 20's or early 30's  Vaping Use   Vaping status: Never Used  Substance and Sexual Activity   Alcohol use: Yes    Alcohol/week: 0.0 standard drinks of alcohol    Comment: socially   Drug use: No   Sexual activity: Not on file  Other Topics Concern   Not on file  Social History Narrative   Tobacco use, amount per day now: None      Past tobacco use, amount per day: in her late 17s a pack per day      How many years did you use tobacco: 10      Alcohol use (drinks per week): occasional wine      Diet:      Do you drink/eat things with caffeine? Tea, soft drinks and coffee      Marital status: Married             What year were you married? 1962      Do you live in a house, apartment, assisted living, [place],  trailer? Apartment      Is it one or more stories? Yes      How many persons live in your home? 0      Do you have any pets in your home? 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 Yes            How often? Water aerobics       Do you have a living will? Yes      Do you have a DNR form? Yes           If not, do you want to discuss one?      Do you have signed POA/HPOA forms?  Yes            Social Drivers of Corporate investment banker Strain: Low Risk  (02/21/2017)   Overall Financial Resource Strain (CARDIA)    Difficulty of Paying Living Expenses: Not hard at all  Food Insecurity: No Food Insecurity (02/21/2017)   Hunger Vital Sign    Worried About Running Out of Food in the Last Year: Never true    Ran Out of Food in the Last Year: Never true  Transportation Needs: No Transportation Needs (02/21/2017)   PRAPARE - Administrator, Civil Service (Medical): No    Lack of Transportation (Non-Medical): No  Physical Activity: Insufficiently Active (02/21/2017)   Exercise Vital Sign    Days of Exercise per Week: 3 days    Minutes of Exercise per Session: 30  min  Stress: No Stress Concern Present (02/21/2017)   [employer] of Occupational Health - Occupational Stress Questionnaire    Feeling of Stress : Only a little  Social Connections: Moderately Integrated (03/05/2018)   Social Connection and Isolation Panel [NHANES]    Frequency of Communication with Friends and Family: More than three times a week    Frequency of Social Gatherings with Friends and Family: More than three times a week    Attends Religious Services: 1 to 4 times per year    Active Member of [employer] or Organizations: Yes    Attends Banker Meetings: 1 to 4 times per year    Marital Status: Widowed  Intimate Partner Violence: Not At Risk (02/21/2017)   Humiliation, Afraid, Rape, and Kick questionnaire    Fear of Current or Ex-Partner: No    Emotionally Abused: No    Physically Abused: No    Sexually Abused: No    Past Surgical History:  Procedure Laterality Date   COLONOSCOPY     11/90,11/91,1994,2000,2010   DILATION AND CURETTAGE OF UTERUS     DILATION AND CURETTAGE OF UTERUS  1997   FOOT SURGERY     x 2   HAND SURGERY Right    Carpel Tunnel   POLYPECTOMY     TUBAL LIGATION     uterine polyps removed     x2      Current Outpatient Medications:    Accu-Chek FastClix Lancets MISC, USE AS DIRECTED., Disp: 200 each, Rfl: 2   acetaminophen (TYLENOL) 500 MG tablet, Take 1,000 mg by mouth every morning., Disp: , Rfl:    acetaminophen (TYLENOL) 650 MG CR tablet, Take 1,300 mg by mouth at bedtime., Disp: , Rfl:    allopurinol (ZYLOPRIM) 100 MG tablet, TAKE TWO TABLETS BY MOUTH ONCE DAILY, Disp: 180 tablet, Rfl: 1   ALPRAZolam (XANAX) 0.25 MG tablet, TAKE ONE  TABLET BY MOUTH TWICE DAILY AS NEEDED, Disp: 60 tablet, Rfl: 1   benzonatate (TESSALON) 200 MG capsule, Take 1 capsule (200 mg total) by mouth 2 (two) times daily as needed for cough., Disp: 20 capsule, Rfl: 0   bisoprolol-hydrochlorothiazide (ZIAC) 10-6.25 MG tablet, TAKE ONE TABLET BY MOUTH DAILY,  Disp: 90 tablet, Rfl: 1   CALCIUM PO, Take 600 mg by mouth daily., Disp: , Rfl:    Cholecalciferol (VITAMIN D3) 50 MCG (2000 UT) capsule, Take 2,000 Units by mouth daily., Disp: , Rfl:    cyanocobalamin (VITAMIN B12) 1000 MCG tablet, Take 1 tablet (1,000 mcg total) by mouth 2 (two) times a week., Disp: , Rfl:    esomeprazole (NEXIUM) 40 MG capsule, Take 1 capsule (40 mg total) by mouth 2 (two) times daily before a meal., Disp: 180 capsule, Rfl: 0   FLUoxetine (PROZAC) 40 MG capsule, TAKE ONE CAPSULE BY MOUTH EVERY DAY., Disp: 90 capsule, Rfl: 1   glipiZIDE (GLUCOTROL) 5 MG tablet, Take 1 tablet (5 mg total) by mouth 2 (two) times daily before a meal., Disp: 180 tablet, Rfl: 1   glucose blood (ACCU-CHEK SMARTVIEW) test strip, CHECK BLOOD SUGAR 3 TIMES DAILY AS DIRECTED., Disp: 200 strip, Rfl: 2   Multiple Vitamins-Minerals (PRESERVISION AREDS PO), Take 1 tablet by mouth in the morning and at bedtime., Disp: , Rfl:    nystatin (MYCOSTATIN/NYSTOP) powder, Apply 1 Application topically as needed., Disp: , Rfl:    Omega-3 Fatty Acids (FISH OIL) 1000 MG CAPS, Take 1 capsule by mouth daily., Disp: , Rfl:     Physical Exam: There were no vitals taken for this visit.    Affect appropriate Healthy:  appears stated age HEENT: normal Neck supple with no adenopathy JVP normal no bruits no thyromegaly Lungs clear with no wheezing and good diaphragmatic motion Heart:  S1/S2 no murmur, no rub, gallop or click PMI normal Abdomen: benighn, BS positve, no tenderness, no AAA no bruit.  No HSM or HJR Distal pulses intact with no bruits No edema Neuro non-focal Skin warm and dry No muscular weakness   Labs:   Lab Results  Component Value Date   WBC 9.7 03/29/2023   HGB 12.8 03/29/2023   HCT 39.6 03/29/2023   MCV 96.5 03/29/2023   PLT 305.0 03/29/2023   No results for input(s): "NA", "K", "CL", "CO2", "BUN", "CREATININE", "CALCIUM", "PROT", "BILITOT", "ALKPHOS", "ALT", "AST", "GLUCOSE" in the  last 168 hours.  Invalid input(s): "LABALBU" No results found for: "CKTOTAL", "CKMB", "CKMBINDEX", "TROPONINI"  Lab Results  Component Value Date   CHOL 321 (H) 04/12/2022   CHOL 305 (H) 11/30/2021   CHOL 321 (H) 11/24/2020   Lab Results  Component Value Date   HDL 47 (L) 04/12/2022   HDL 42 (L) 11/30/2021   HDL 43 (L) 11/24/2020   Lab Results  Component Value Date   LDLCALC 209 (H) 04/12/2022   LDLCALC  11/30/2021     Comment:     . LDL cholesterol not calculated. Triglyceride levels greater than 400 mg/dL invalidate calculated LDL results. . Reference range: <100 . Desirable range <100 mg/dL for primary prevention;   <70 mg/dL for patients with CHD or diabetic patients  with > or = 2 CHD risk factors. Marland Kitchen LDL-C is now calculated using the Martin-Hopkins  calculation, which is a validated novel method providing  better accuracy than the Friedewald equation in the  estimation of LDL-C.  Horald Pollen et al. Lenox Ahr. 1610;960(45): 2061-2068  (http://education.[facility].com/faq/FAQ164)  LDLCALC 215 (H) 11/24/2020   Lab Results  Component Value Date   TRIG 381 (H) 04/12/2022   TRIG 413 (H) 11/30/2021   TRIG 373 (H) 11/24/2020   Lab Results  Component Value Date   CHOLHDL 6.8 (H) 04/12/2022   CHOLHDL 7.3 (H) 11/30/2021   CHOLHDL 7.5 (H) 11/24/2020   Lab Results  Component Value Date   LDLDIRECT 136.7 02/15/2013   LDLDIRECT 137.1 11/20/2012   LDLDIRECT 158.7 04/14/2011      Radiology: No results found.  EKG: ***   ASSESSMENT AND PLAN:   Chest Pain:  atypical no history of Modest risk factors given age Shared decision making favor PET/CT to risk stratify Dyspnea:  likely functional with restrictive lung dx and hiatal hernia TTE to assess EF GERD: with hiatal hernia EGD low risk ok to proceed with study HTN:  continue Ziac not clear why she is not on ACE/ARB with DM DM:  Discussed low carb diet.  Target hemoglobin A1c is 6.5 or less.  Continue current  medications. Depression:  continue Prozac    Echo PET/CT  F/U PRN pending studies   Signed: Charlton Haws 05/22/2023, 9:53 AM

## 2023-05-29 ENCOUNTER — Ambulatory Visit: Payer: Medicare PPO | Attending: Cardiovascular Disease | Admitting: Cardiovascular Disease

## 2023-05-30 ENCOUNTER — Encounter: Payer: Self-pay | Admitting: Cardiovascular Disease

## 2023-05-31 ENCOUNTER — Ambulatory Visit
Admission: RE | Admit: 2023-05-31 | Discharge: 2023-05-31 | Disposition: A | Discharge status: Home / Self Care | Source: Ambulatory Visit | Attending: Nurse Practitioner | Admitting: Nurse Practitioner

## 2023-05-31 DIAGNOSIS — E1169 Type 2 diabetes mellitus with other specified complication: Secondary | ICD-10-CM

## 2023-05-31 DIAGNOSIS — Z1231 Encounter for screening mammogram for malignant neoplasm of breast: Secondary | ICD-10-CM | POA: Diagnosis not present

## 2023-06-13 ENCOUNTER — Encounter: Payer: Self-pay | Admitting: Dermatology

## 2023-06-13 ENCOUNTER — Ambulatory Visit: Payer: Medicare PPO | Admitting: Dermatology

## 2023-06-13 VITALS — BP 137/85 | HR 60

## 2023-06-13 DIAGNOSIS — L820 Inflamed seborrheic keratosis: Secondary | ICD-10-CM | POA: Diagnosis not present

## 2023-06-13 DIAGNOSIS — D492 Neoplasm of unspecified behavior of bone, soft tissue, and skin: Secondary | ICD-10-CM

## 2023-06-13 DIAGNOSIS — L309 Dermatitis, unspecified: Secondary | ICD-10-CM

## 2023-06-13 DIAGNOSIS — D3611 Benign neoplasm of peripheral nerves and autonomic nervous system of face, head, and neck: Secondary | ICD-10-CM | POA: Diagnosis not present

## 2023-06-13 DIAGNOSIS — L570 Actinic keratosis: Secondary | ICD-10-CM | POA: Diagnosis not present

## 2023-06-13 DIAGNOSIS — L720 Epidermal cyst: Secondary | ICD-10-CM

## 2023-06-13 DIAGNOSIS — L814 Other melanin hyperpigmentation: Secondary | ICD-10-CM | POA: Diagnosis not present

## 2023-06-13 DIAGNOSIS — W908XXA Exposure to other nonionizing radiation, initial encounter: Secondary | ICD-10-CM | POA: Diagnosis not present

## 2023-06-13 DIAGNOSIS — L578 Other skin changes due to chronic exposure to nonionizing radiation: Secondary | ICD-10-CM

## 2023-06-13 DIAGNOSIS — D485 Neoplasm of uncertain behavior of skin: Secondary | ICD-10-CM

## 2023-06-13 DIAGNOSIS — Z1283 Encounter for screening for malignant neoplasm of skin: Secondary | ICD-10-CM

## 2023-06-13 DIAGNOSIS — D229 Melanocytic nevi, unspecified: Secondary | ICD-10-CM

## 2023-06-13 DIAGNOSIS — D1801 Hemangioma of skin and subcutaneous tissue: Secondary | ICD-10-CM | POA: Diagnosis not present

## 2023-06-13 DIAGNOSIS — L821 Other seborrheic keratosis: Secondary | ICD-10-CM

## 2023-06-13 MED ORDER — TRIAMCINOLONE ACETONIDE 0.1 % EX CREA: 1.0000 | TOPICAL_CREAM | Freq: Two times a day (BID) | CUTANEOUS | 2 refills | Status: AC

## 2023-06-13 NOTE — Patient Instructions (Addendum)
Cryotherapy Aftercare  Wash gently with soap and water everyday.   Apply Vaseline and Band-Aid daily until healed.  Patient Handout: Wound Care for Skin Biopsy Site  Taking Care of Your Skin Biopsy Site  Proper care of the biopsy site is essential for promoting healing and minimizing scarring. This handout provides instructions on how to care for your biopsy site to ensure optimal recovery.  1. Cleaning the Wound:  Clean the biopsy site daily with gentle soap and water. Gently pat the area dry with a clean, soft towel. Avoid harsh scrubbing or rubbing the area, as this can irritate the skin and delay healing.  2. Applying Aquaphor and Bandage:  After cleaning the wound, apply a thin layer of Aquaphor ointment to the biopsy site. Cover the area with a sterile bandage to protect it from dirt, bacteria, and friction. Change the bandage daily or as needed if it becomes soiled or wet.  3. Continued Care for One Week:  Repeat the cleaning, Aquaphor application, and bandaging process daily for one week following the biopsy procedure. Keeping the wound clean and moist during this initial healing period will help prevent infection and promote optimal healing.  4. Massaging Aquaphor into the Area:  ---After one week, discontinue the use of bandages but continue to apply Aquaphor to the biopsy site. ----Gently massage the Aquaphor into the area using circular motions. ---Massaging the skin helps to promote circulation and prevent the formation of scar tissue.   Additional Tips:  Avoid exposing the biopsy site to direct sunlight during the healing process, as this can cause hyperpigmentation or worsen scarring. If you experience any signs of infection, such as increased redness, swelling, warmth, or drainage from the wound, contact your healthcare provider immediately. Follow any additional instructions provided by your healthcare provider for caring for the biopsy site and managing any  discomfort. Conclusion:  Taking proper care of your skin biopsy site is crucial for ensuring optimal healing and minimizing scarring. By following these instructions for cleaning, applying Aquaphor, and massaging the area, you can promote a smooth and successful recovery. If you have any questions or concerns about caring for your biopsy site, don't hesitate to contact your healthcare provider for guidance.    Important Information   Due to recent changes in healthcare laws, you may see results of your pathology and/or laboratory studies on MyChart before the doctors have had a chance to review them. We understand that in some cases there may be results that are confusing or concerning to you. Please understand that not all results are received at the same time and often the doctors may need to interpret multiple results in order to provide you with the best plan of care or course of treatment. Therefore, we ask that you please give [place] 2 business days to thoroughly review all your results before contacting the office for clarification. Should we see a critical lab result, you will be contacted sooner.     If You Need Anything After Your Visit   If you have any questions or concerns for your doctor, please call our main line at [phone]. If no one answers, please leave a voicemail as directed and we will return your call as soon as possible. Messages left after 4 pm will be answered the following business day.    You may also send [place] a message via MyChart. We typically respond to MyChart messages within 1-2 business days.  For prescription refills, please ask your pharmacy to contact our  office. Our fax number is [phone].  If you have an urgent issue when the clinic is closed that cannot wait until the next business day, you can page your doctor at the number below.     Please note that while we do our best to be available for urgent issues outside of office hours, we are not available  24/7.    If you have an urgent issue and are unable to reach [place], you may choose to seek medical care at your doctor's office, retail clinic, urgent care center, or emergency room.   If you have a medical emergency, please immediately call 911 or go to the emergency department. In the event of inclement weather, please call our main line at [phone] for an update on the status of any delays or closures.  Dermatology Medication Tips: Please keep the boxes that topical medications come in in order to help keep track of the instructions about where and how to use these. Pharmacies typically print the medication instructions only on the boxes and not directly on the medication tubes.   If your medication is too expensive, please contact our office at [phone] or send [place] a message through MyChart.    We are unable to tell what your co-pay for medications will be in advance as this is different depending on your insurance coverage. However, we may be able to find a substitute medication at lower cost or fill out paperwork to get insurance to cover a needed medication.    If a prior authorization is required to get your medication covered by your insurance company, please allow [place] 1-2 business days to complete this process.   Drug prices often vary depending on where the prescription is filled and some pharmacies may offer cheaper prices.   The website www.goodrx.com contains coupons for medications through different pharmacies. The prices here do not account for what the cost may be with help from insurance (it may be cheaper with your insurance), but the website can give you the price if you did not use any insurance.  - You can print the associated coupon and take it with your prescription to the pharmacy.  - You may also stop by our office during regular business hours and pick up a GoodRx coupon card.  - If you need your prescription sent electronically to a different pharmacy, notify our  office through [facility] or by phone at [phone]

## 2023-06-13 NOTE — Progress Notes (Signed)
 New Patient Visit   Subjective  Brittany Mcclure is a 83 y.o. female who presents for the following: Total Body Skin Exam (TBSE)  Patient present today for new patient visit for TBSE.The patient reports she has spots, moles and lesions to be evaluated, some may be new or changing and the patient may have concern these could be cancer. Patient has previously been treated by a dermatologist. Patient reports she does not have hx of bx. Patient denies family history of skin cancers. Patient reports throughout her lifetime has had moderate sun exposure. Currently, patient reports if he  has excessive sun exposure, she does not apply sunscreen and/or wears protective coverings.  The following portions of the chart were reviewed this encounter and updated as appropriate: medications, allergies, medical history  Review of Systems:  No other skin or systemic complaints except as noted in HPI or Assessment and Plan.  Objective  Well appearing patient in no apparent distress; mood and affect are within normal limits.  A full examination was performed including scalp, head, eyes, ears, nose, lips, neck, chest, axillae, abdomen, back, buttocks, bilateral upper extremities, bilateral lower extremities, hands, feet, fingers, toes, fingernails, and toenails. All findings within normal limits unless otherwise noted below.   Relevant exam findings are noted in the Assessment and Plan.  Chest - Medial [facility]), Left Breast, Right Breast Erythematous thin papules/macules with gritty scale.  Right Chin 6 mm pearly papule  Assessment & Plan   LENTIGINES, SEBORRHEIC KERATOSES, HEMANGIOMAS - Benign normal skin lesions - Benign-appearing - Call for any changes  MELANOCYTIC NEVI - Tan-brown and/or pink-flesh-colored symmetric macules and papules - Benign appearing on exam today - Observation - Call clinic for new or changing moles - Recommend daily use of broad spectrum spf 30+ sunscreen to sun-exposed  areas.   ACTINIC DAMAGE - Chronic condition, secondary to cumulative UV/sun exposure - diffuse scaly erythematous macules with underlying dyspigmentation - Recommend daily broad spectrum sunscreen SPF 30+ to sun-exposed areas, reapply every 2 hours as needed.  - Staying in the shade or wearing long sleeves, sun glasses (UVA+UVB protection) and wide brim hats (4-inch brim around the entire circumference of the hat) are also recommended for sun protection.  - Call for new or changing lesions.  Milia - tiny firm white papules - type of cyst - benign - sometimes these will clear with nightly OTC adapalene/Differin 0.1% gel or retinol. - may be extracted if symptomatic - observe  ACTINIC KERATOSIS Exam: Erythematous thin papules/macules with gritty scale at the medial, left and right chest   Actinic keratoses are precancerous spots that appear secondary to cumulative UV radiation exposure/sun exposure over time. They are chronic with expected duration over 1 year. A portion of actinic keratoses will progress to squamous cell carcinoma of the skin. It is not possible to reliably predict which spots will progress to skin cancer and so treatment is recommended to prevent development of skin cancer.  Recommend daily broad spectrum sunscreen SPF 30+ to sun-exposed areas, reapply every 2 hours as needed.  Recommend staying in the shade or wearing long sleeves, sun glasses (UVA+UVB protection) and wide brim hats (4-inch brim around the entire circumference of the hat). Call for new or changing lesions.  Treatment Plan: - Cryo completed while in office today  Dermatitis  Exam: Scaly pink papules coalescing to plaques on back  Treatment Plan: - We will prescribe TMC to apply 2 times  SKIN CANCER SCREENING PERFORMED TODAY INFLAMED SEBORRHEIC KERATOSIS Right Thigh -  Anterior Destruction of lesion - Right Thigh - Anterior Complexity: simple   Destruction method: cryotherapy   Informed  consent: discussed and consent obtained   Timeout:  patient name, date of birth, surgical site, and procedure verified Lesion destroyed using liquid nitrogen: Yes   Cryotherapy cycles:  2 Post-procedure details: wound care instructions given   AK (ACTINIC KERATOSIS) (3) Chest - Medial (Center), Left Breast, Right Breast Destruction of lesion - Chest - Medial (Center), Left Breast, Right Breast Complexity: simple   Destruction method: cryotherapy   Informed consent: discussed and consent obtained   Timeout:  patient name, date of birth, surgical site, and procedure verified Lesion destroyed using liquid nitrogen: Yes   Cryotherapy cycles:  3 Post-procedure details: wound care instructions given   NEOPLASM OF UNCERTAIN BEHAVIOR OF SKIN Right Chin Epidermal / dermal shaving  Lesion diameter (cm):  0.6 Informed consent: discussed and consent obtained   Timeout: patient name, date of birth, surgical site, and procedure verified   Procedure prep:  Patient was prepped and draped in usual sterile fashion Prep type:  Isopropyl alcohol Anesthesia: the lesion was anesthetized in a standard fashion   Anesthetic:  1% lidocaine w/ epinephrine 1-100,000 buffered w/ 8.4% NaHCO3 Instrument used: DermaBlade and flexible razor blade   Hemostasis achieved with: pressure, aluminum chloride and electrodesiccation   Outcome: patient tolerated procedure well   Post-procedure details: sterile dressing applied and wound care instructions given   Dressing type: bandage and petrolatum   Additional details:  Pt aware that benign results will be sent to mychart and the staff will call abnormal results will Specimen A - Surgical pathology Differential Diagnosis: Bcc vs spiradenoma  Check Margins: No DERMATITIS   Related Medications triamcinolone cream (KENALOG) 0.1 % Apply 1 Application topically 2 (two) times daily. Apply 2 times daily to the back for up to 2 weeks then STOP, Resume in 2 weeks if  irritation is not resolved SKIN EXAM FOR MALIGNANT NEOPLASM   MULTIPLE BENIGN MELANOCYTIC NEVI   CHERRY ANGIOMA   LENTIGINES   SEBORRHEIC KERATOSIS   ACTINIC SKIN DAMAGE    Return in about 1 year (around 06/12/2024) for TBSE.   Documentation: I have reviewed the above documentation for accuracy and completeness, and I agree with the above.  Langston Reusing, DO

## 2023-06-14 LAB — SURGICAL PATHOLOGY

## 2023-06-19 ENCOUNTER — Ambulatory Visit: Payer: Medicare PPO | Admitting: Physician Assistant

## 2023-06-19 ENCOUNTER — Encounter: Payer: Self-pay | Admitting: Physician Assistant

## 2023-06-19 VITALS — BP 126/74 | HR 76 | Ht 61.0 in | Wt 200.0 lb

## 2023-06-19 DIAGNOSIS — K219 Gastro-esophageal reflux disease without esophagitis: Secondary | ICD-10-CM | POA: Diagnosis not present

## 2023-06-19 DIAGNOSIS — Z8711 Personal history of peptic ulcer disease: Secondary | ICD-10-CM

## 2023-06-19 DIAGNOSIS — R0609 Other forms of dyspnea: Secondary | ICD-10-CM | POA: Diagnosis not present

## 2023-06-19 DIAGNOSIS — K449 Diaphragmatic hernia without obstruction or gangrene: Secondary | ICD-10-CM | POA: Diagnosis not present

## 2023-06-19 MED ORDER — ESOMEPRAZOLE MAGNESIUM 40 MG PO CPDR: 40.0000 mg | DELAYED_RELEASE_CAPSULE | Freq: Every day | ORAL | 3 refills | Status: DC

## 2023-06-19 NOTE — Patient Instructions (Addendum)
 Please take your proton pump inhibitor medication, nexium 40 mg once daily  Please take this medication 30 minutes to 1 hour before meals- this makes it more effective.  Avoid spicy and acidic foods Avoid fatty foods Limit your intake of coffee, tea, alcohol, and carbonated drinks Work to maintain a healthy weight Keep the head of the bed elevated at least 3 inches with blocks or a wedge pillow if you are having any nighttime symptoms Stay upright for 2 hours after eating Avoid meals and snacks three to four hours before bedtime   Follow up with cardiology  I appreciate the  opportunity to care for you  Thank You   [facility]

## 2023-06-19 NOTE — Progress Notes (Signed)
 06/19/2023 Brittany Mcclure [id number] 03-Aug-1940  Referring provider: Mast, Man X, NP Primary GI doctor: Dr. Rhea Belton  ASSESSMENT AND PLAN:  Possible Peptic Ulcer Disease/GERD/Hiatal hernia 2017 EGD with gastric ulcers 2020 EGD with 4 cm [facility] 03/30/2023 CT abdomen pelvis with contrast sigmoid diverticulosis no diverticulitis 8 cm leave adnexal cyst 03/31/2023 abdominal pelvic ultrasound showed thickened endometrium simple appearing left adnexal cyst referred to GYN No melena, has had improvement of her symptoms from GI on nexium twice a day and off ozempic but continues with DOE/chest discomfort - stay on nexium 40 mg once daily - stay off ozempic - follow up 2-3 months .consider EGD at that time  Possible Cardiac Disease CAD risks: diabetes, chol, hypertension, maternal GM with heart disease.  Appointment rescheduled April 28th with cardiology.  Reports chest discomfort, DOE, and back pain, worsening exercise capacity and fatigue. -Advise patient to seek immediate medical attention if chest pain or shortness of breath worsens.  Diabetes Off ozempic due to worsening GI symptoms Follow with PCP   Thickened endometrium and left adnexal cyst Referred to GYN following with Dr. Hyacinth Meeker    Patient Care Team: Mast, Man X, NP as PCP - General (Internal Medicine) Bradly Bienenstock, MD as Consulting Physician (Orthopedic Surgery) Janet Berlin, MD as Consulting Physician (Ophthalmology)  HISTORY OF PRESENT ILLNESS: 83 y.o. female with a past medical history of hypertension, GERD, type 2 diabetes CKD stage IIIa, dyslipidemia, anxiety, anemia of CKD , history of PUD, family history of colon cancer in mother, diverticulosis, history of adenomatous polyps and others listed below presents for follow-up, last seen in January by myself.   05/18/2018 last seen in the office by Dr. Rhea Belton 05/22/2018 EGD and colonoscopy with Dr. Rhea Belton Colonoscopy showed 3 mm polyp ascending colon diverticula sigmoid  and descending colon.  Polyp was benign no recall for age Endoscopy for epigastric pain and GERD showed 4 cm hiatal hernia, normal mucosa in entire esophagus, erosive gastritis, normal duodenum.  Path with negative H. pylori no abnormal cells.  Discussed the use of AI scribe software for clinical note transcription with the patient, who gave verbal consent to proceed.  History of Present Illness   Brittany Mcclure is an 83 year old female with a history of hiatal hernia and ulcer who presents for follow-up of gastrointestinal symptoms.  She manages her gastrointestinal symptoms with over-the-counter Nexium as needed, especially after late meals or greasy foods. Previously, she completed a prescription of Nexium at a higher dose, taken twice daily for two to three months, which improved her symptoms significantly. She no longer experiences significant discomfort and describes past episodes of needing to burp but being unable to, which resolved after burping. Currently, she feels her stomach is 'okay now.'  She discontinued Ozempic due to adverse effects, stating it made her feel sick. Her blood sugar levels have been variable, sometimes slightly elevated, but she does not consistently monitor them.  She has a history of a hiatal hernia diagnosed in 2020, which was not prominent on a recent CT scan. She acknowledges that the hernia may contribute to her symptoms, including reflux and difficulty burping.  No recent episodes of black or bloody stools. She is aware of a past ulcer and recognizes symptoms when they occur, although they are infrequent.  She continues to have decreased exercise capacity and DOE on exertion. She is follow up with Dr. Hyacinth Meeker for ovarian cyst and thickened endometrium.      She  reports that she  has quit smoking. She has never been exposed to tobacco smoke. She has never used smokeless tobacco. She reports current alcohol use. She reports that she does not use  drugs.  RELEVANT LABS AND IMAGING:  CBC    Component Value Date/Time   WBC 9.7 03/29/2023 1105   RBC 4.11 03/29/2023 1105   HGB 12.8 03/29/2023 1105   HCT 39.6 03/29/2023 1105   PLT 305.0 03/29/2023 1105   MCV 96.5 03/29/2023 1105   MCH 31.4 04/12/2022 0703   MCHC 32.4 03/29/2023 1105   RDW 15.7 (H) 03/29/2023 1105   LYMPHSABS 1.8 03/29/2023 1105   MONOABS 0.6 03/29/2023 1105   EOSABS 0.2 03/29/2023 1105   BASOSABS 0.1 03/29/2023 1105   Recent Labs    03/29/23 1105  HGB 12.8    CMP     Component Value Date/Time   NA 138 03/29/2023 1105   K 4.7 03/29/2023 1105   CL 102 03/29/2023 1105   CO2 29 03/29/2023 1105   GLUCOSE 129 (H) 03/29/2023 1105   BUN 30 (H) 03/29/2023 1105   CREATININE 1.10 03/29/2023 1105   CREATININE 1.10 (H) 04/12/2022 0703   CALCIUM 9.2 03/29/2023 1105   PROT 6.6 03/29/2023 1105   ALBUMIN 4.3 03/29/2023 1105   AST 21 03/29/2023 1105   ALT 22 03/29/2023 1105   ALKPHOS 85 03/29/2023 1105   BILITOT 0.4 03/29/2023 1105   GFRNONAA 45 (L) 06/02/2020 0715   GFRAA 52 (L) 06/02/2020 0715      Latest Ref Rng & Units 03/29/2023   11:05 AM 04/12/2022    7:03 AM 11/30/2021    7:05 AM  Hepatic Function  Total Protein 6.0 - 8.3 g/dL 6.6  6.4  6.4   Albumin 3.5 - 5.2 g/dL 4.3     AST 0 - 37 U/L 21  13  12    ALT 0 - 35 U/L 22  12  12    Alk Phosphatase 39 - 117 U/L 85     Total Bilirubin 0.2 - 1.2 mg/dL 0.4  0.5  0.4       Current Medications:   Current Outpatient Medications (Endocrine & Metabolic):    glipiZIDE (GLUCOTROL) 5 MG tablet, Take 1 tablet (5 mg total) by mouth 2 (two) times daily before a meal.  Current Outpatient Medications (Cardiovascular):    bisoprolol-hydrochlorothiazide (ZIAC) 10-6.25 MG tablet, TAKE ONE TABLET BY MOUTH DAILY   Current Outpatient Medications (Analgesics):    acetaminophen (TYLENOL) 500 MG tablet, Take 1,000 mg by mouth every morning.   acetaminophen (TYLENOL) 650 MG CR tablet, Take 1,300 mg by mouth at  bedtime.  Current Outpatient Medications (Hematological):    cyanocobalamin (VITAMIN B12) 1000 MCG tablet, Take 1 tablet (1,000 mcg total) by mouth 2 (two) times a week.  Current Outpatient Medications (Other):    Accu-Chek FastClix Lancets MISC, USE AS DIRECTED.   ALPRAZolam (XANAX) 0.25 MG tablet, TAKE ONE TABLET BY MOUTH TWICE DAILY AS NEEDED   CALCIUM PO, Take 600 mg by mouth daily.   Cholecalciferol (VITAMIN D3) 50 MCG (2000 UT) capsule, Take 2,000 Units by mouth daily.   FLUoxetine (PROZAC) 40 MG capsule, TAKE ONE CAPSULE BY MOUTH EVERY DAY.   glucose blood (ACCU-CHEK SMARTVIEW) test strip, CHECK BLOOD SUGAR 3 TIMES DAILY AS DIRECTED.   Multiple Vitamins-Minerals (PRESERVISION AREDS PO), Take 1 tablet by mouth in the morning and at bedtime.   nystatin (MYCOSTATIN/NYSTOP) powder, Apply 1 Application topically as needed.   Omega-3 Fatty Acids (FISH OIL) 1000 MG  CAPS, Take 1 capsule by mouth daily.   triamcinolone cream (KENALOG) 0.1 %, Apply 1 Application topically 2 (two) times daily. Apply 2 times daily to the back for up to 2 weeks then STOP, Resume in 2 weeks if irritation is not resolved   esomeprazole (NEXIUM) 40 MG capsule, Take 1 capsule (40 mg total) by mouth daily.  Medical History:  Past Medical History:  Diagnosis Date   Allergy    Anemia    Anxiety    Arthritis    with gout   Carotid bruit    right   Cataract    bil cataracts removed   Chest pain    Chronic kidney disease    Colon polyps    Depression    Diabetes type 2, uncontrolled    Diverticulosis    Duodenitis    Exogenous obesity    Gastric ulcer    GERD (gastroesophageal reflux disease)    Hyperlipidemia    Hypertension    Metabolic syndrome    Osteopenia    Seasonal allergies    seasonal   Tubular adenoma of colon    Allergies:  Allergies  Allergen Reactions   Crestor [Rosuvastatin Calcium]     Myalgias    Elemental Sulfur     Made infection worse   Gemfibrozil Itching   Iodine      Skin rash   Lipitor [Atorvastatin Calcium]     Myalgias    Lovastatin     myalgias   Sulfonamide Derivatives     REACTION: n\T\v   Zetia [Ezetimibe]     Cough, dry     Surgical History:  She  has a past surgical history that includes Foot surgery; Tubal ligation; Dilation and curettage of uterus; Colonoscopy; Polypectomy; Hand surgery (Right); uterine polyps removed; and Dilation and curettage of uterus (1997). Family History:  Her family history includes Colon cancer in her mother; Diabetes in her maternal grandmother; Heart failure in her maternal grandmother; Hypertension in her father; Stroke in her father.  REVIEW OF SYSTEMS  : All other systems reviewed and negative except where noted in the History of Present Illness.  PHYSICAL EXAM: BP 126/74   Pulse 76   Ht 5\' 1"  (1.549 m)   Wt 200 lb (90.7 kg)   BMI 37.79 kg/m  General Appearance: Well nourished, in no apparent distress. Head:   Normocephalic and atraumatic. Eyes:  sclerae anicteric,conjunctive pink  Respiratory: Respiratory effort normal, BS equal bilaterally without rales, rhonchi, wheezing. Cardio: RRR with no MRGs. Peripheral pulses intact.  Abdomen: Soft,  Obese ,active bowel sounds.Non tender. Without guarding and Without rebound. No masses. Rectal: Not evaluated Musculoskeletal: Full ROM, Normal gait. Without edema. Skin:  Dry and intact without significant lesions or rashes Neuro: Alert and  oriented x4;  No focal deficits. Psych:  Cooperative. Normal mood and affect.    Doree Albee, PA-C 10:32 AM

## 2023-06-22 DIAGNOSIS — H40013 Open angle with borderline findings, low risk, bilateral: Secondary | ICD-10-CM | POA: Diagnosis not present

## 2023-06-22 DIAGNOSIS — E119 Type 2 diabetes mellitus without complications: Secondary | ICD-10-CM | POA: Diagnosis not present

## 2023-06-22 DIAGNOSIS — H524 Presbyopia: Secondary | ICD-10-CM | POA: Diagnosis not present

## 2023-06-22 DIAGNOSIS — H04123 Dry eye syndrome of bilateral lacrimal glands: Secondary | ICD-10-CM | POA: Diagnosis not present

## 2023-06-22 DIAGNOSIS — H353132 Nonexudative age-related macular degeneration, bilateral, intermediate dry stage: Secondary | ICD-10-CM | POA: Diagnosis not present

## 2023-06-22 DIAGNOSIS — H43813 Vitreous degeneration, bilateral: Secondary | ICD-10-CM | POA: Diagnosis not present

## 2023-06-22 DIAGNOSIS — H52203 Unspecified astigmatism, bilateral: Secondary | ICD-10-CM | POA: Diagnosis not present

## 2023-06-22 LAB — HM DIABETES EYE EXAM

## 2023-06-29 NOTE — Progress Notes (Signed)
 Addendum: Reviewed and agree with assessment and management plan. Asha Grumbine, Carie Caddy, MD

## 2023-07-05 ENCOUNTER — Ambulatory Visit (HOSPITAL_BASED_OUTPATIENT_CLINIC_OR_DEPARTMENT_OTHER): Payer: Medicare PPO | Admitting: Obstetrics & Gynecology

## 2023-07-05 ENCOUNTER — Other Ambulatory Visit (HOSPITAL_BASED_OUTPATIENT_CLINIC_OR_DEPARTMENT_OTHER): Payer: Medicare PPO

## 2023-07-06 ENCOUNTER — Telehealth: Payer: Self-pay

## 2023-07-06 ENCOUNTER — Non-Acute Institutional Stay: Payer: Medicare PPO | Admitting: Nurse Practitioner

## 2023-07-06 ENCOUNTER — Encounter: Payer: Self-pay | Admitting: Nurse Practitioner

## 2023-07-06 VITALS — BP 138/84 | HR 71 | Temp 97.3°F | Ht 61.0 in | Wt 200.0 lb

## 2023-07-06 DIAGNOSIS — R300 Dysuria: Secondary | ICD-10-CM

## 2023-07-06 DIAGNOSIS — I10 Essential (primary) hypertension: Secondary | ICD-10-CM | POA: Diagnosis not present

## 2023-07-06 DIAGNOSIS — M1A00X0 Idiopathic chronic gout, unspecified site, without tophus (tophi): Secondary | ICD-10-CM | POA: Diagnosis not present

## 2023-07-06 DIAGNOSIS — F419 Anxiety disorder, unspecified: Secondary | ICD-10-CM | POA: Diagnosis not present

## 2023-07-06 DIAGNOSIS — E785 Hyperlipidemia, unspecified: Secondary | ICD-10-CM

## 2023-07-06 DIAGNOSIS — E669 Obesity, unspecified: Secondary | ICD-10-CM

## 2023-07-06 DIAGNOSIS — N9489 Other specified conditions associated with female genital organs and menstrual cycle: Secondary | ICD-10-CM | POA: Insufficient documentation

## 2023-07-06 DIAGNOSIS — K219 Gastro-esophageal reflux disease without esophagitis: Secondary | ICD-10-CM | POA: Diagnosis not present

## 2023-07-06 DIAGNOSIS — E1169 Type 2 diabetes mellitus with other specified complication: Secondary | ICD-10-CM | POA: Diagnosis not present

## 2023-07-06 NOTE — Progress Notes (Signed)
 Location:   Clinic [facility]   Place of Service:  Clinic (12) Provider: Abner Hoffman Felice Hope NP  Aniya Jolicoeur X, NP  Patient Care Team: Omesha Bowerman X, NP as PCP - General (Internal Medicine) Arvil Birks, MD as Consulting Physician (Orthopedic Surgery) Rudine Cos, MD as Consulting Physician (Ophthalmology)  Extended Emergency Contact Information Primary Emergency Contact: Myers,Sharon  [place] Mobile Phone: [phone] Relation: Daughter Secondary Emergency Contact: Myers,Randall  [place] Mobile Phone: [phone] Relation: Relative  Code Status:  DNR Goals of care: Advanced Directive information    04/14/2023    9:44 AM  Advanced Directives  Does Patient Have a Medical Advance Directive? Yes  Type of Estate agent of [place];Living will;Out of facility DNR (pink MOST or yellow form)  Does patient want to make changes to medical advance directive? No - Patient declined  Copy of Healthcare Power of Attorney in Chart? No - copy requested     Chief Complaint  Patient presents with   Medical Management of Chronic Issues    3 month follow-up and foot exam. Discussed need for  A1c and diabetic kidney evaluation. Patient c/o urinary frequency, urgency, and lower back pain x 1 week or longer.     HPI:  Pt is a 83 y.o. female seen today for medical management of chronic diseases.      Adnexal mass/L ovarian cystic lesion 6.9 cm, benign? 04/04/23 biopsy endometrium benign,  f/u USOBGYN evaluation for US  T2DM, off Metformin -bloated, failed Ozempic -GI symptoms, on Glipizide , Hgb A1c 6.8 11/30/21<<7.2 04/12/22             No acute gout flare ups, uric acid 4.9 01/01/19, on Allopurinol              Depression, on Alprazolam   prn, on Prozac  CT 06/23/21 unremarkable. TSH 1.99 11/30/21    GERD, stable on Nexium , followed by GI, Hgb 12.8 03/29/23             HTN, takes Beets capsule,  Ziac  10/6.25mg  qd.              LDL, takes Omega 3, allergic to  statin. LDL 209 04/12/22             DDD cervical and lumbar spine: s/p injs, pain is controlled. Vit D 37 11/30/21             CKD Bun/creat 30/1.10 03/29/23             Anemia, Hgb 12.8, Iron 59, Vit B12 >1537 03/29/23  Past Medical History:  Diagnosis Date   Allergy    Anemia    Anxiety    Arthritis    with gout   Carotid bruit    right   Cataract    bil cataracts removed   Chest pain    Chronic kidney disease    Colon polyps    Depression    Diabetes type 2, uncontrolled    Diverticulosis    Duodenitis    Exogenous obesity    Gastric ulcer    GERD (gastroesophageal reflux disease)    Hyperlipidemia    Hypertension    Metabolic syndrome    Osteopenia    Seasonal allergies    seasonal   Tubular adenoma of colon    Past Surgical History:  Procedure Laterality Date   COLONOSCOPY     11/90,11/91,1994,2000,2010   DILATION AND CURETTAGE OF UTERUS     DILATION AND CURETTAGE OF UTERUS  1997  FOOT SURGERY     x 2   HAND SURGERY Right    Carpel Tunnel   POLYPECTOMY     TUBAL LIGATION     uterine polyps removed     x2    Allergies  Allergen Reactions   Crestor [Rosuvastatin Calcium]     Myalgias    Elemental Sulfur     Made infection worse   Gemfibrozil  Itching   Iodine     Skin rash   Lipitor [Atorvastatin Calcium]     Myalgias    Lovastatin     myalgias   Sulfonamide Derivatives     REACTION: n\T\v   Zetia  [Ezetimibe ]     Cough, dry    Allergies as of 07/06/2023       Reactions   Crestor [rosuvastatin Calcium]    Myalgias   Elemental Sulfur    Made infection worse   Gemfibrozil  Itching   Iodine    Skin rash   Lipitor [atorvastatin Calcium]    Myalgias   Lovastatin    myalgias   Sulfonamide Derivatives    REACTION: n\T\v   Zetia  [ezetimibe ]    Cough, dry        Medication List        Accurate as of July 06, 2023 11:59 PM. If you have any questions, ask your nurse or doctor.          Accu-Chek FastClix Lancets Misc USE AS  DIRECTED.   Accu-Chek SmartView test strip Generic drug: glucose blood CHECK BLOOD SUGAR 3 TIMES DAILY AS DIRECTED.   acetaminophen 500 MG tablet Commonly known as: TYLENOL Take 1,000 mg by mouth every morning.   acetaminophen 650 MG CR tablet Commonly known as: TYLENOL Take 1,300 mg by mouth at bedtime.   ALPRAZolam  0.25 MG tablet Commonly known as: XANAX  TAKE ONE TABLET BY MOUTH TWICE DAILY AS NEEDED   bisoprolol -hydrochlorothiazide  10-6.25 MG tablet Commonly known as: ZIAC  TAKE ONE TABLET BY MOUTH DAILY   CALCIUM PO Take 600 mg by mouth daily.   cyanocobalamin  1000 MCG tablet Commonly known as: VITAMIN B12 Take 1 tablet (1,000 mcg total) by mouth 2 (two) times a week.   esomeprazole  40 MG capsule Commonly known as: NEXIUM  Take 1 capsule (40 mg total) by mouth daily.   Fish Oil 1000 MG Caps Take 1 capsule by mouth daily.   FLUoxetine  40 MG capsule Commonly known as: PROZAC  TAKE ONE CAPSULE BY MOUTH EVERY DAY.   glipiZIDE  5 MG tablet Commonly known as: GLUCOTROL  Take 1 tablet (5 mg total) by mouth 2 (two) times daily before a meal.   nystatin  powder Commonly known as: MYCOSTATIN /NYSTOP  Apply 1 Application topically as needed.   PRESERVISION AREDS PO Take 1 tablet by mouth in the morning and at bedtime.   triamcinolone  cream 0.1 % Commonly known as: KENALOG  Apply 1 Application topically 2 (two) times daily. Apply 2 times daily to the back for up to 2 weeks then STOP, Resume in 2 weeks if irritation is not resolved   Vitamin D3 50 MCG (2000 UT) capsule Take 2,000 Units by mouth daily.        Review of Systems  Constitutional:  Negative for appetite change, fatigue and fever.  HENT:  Positive for hearing loss. Negative for congestion and voice change.   Respiratory:  Negative for cough, chest tightness and shortness of breath.   Cardiovascular:  Negative for leg swelling.  Gastrointestinal:  Negative for abdominal pain and constipation.   Genitourinary:  Positive  for dysuria and frequency. Negative for difficulty urinating, hematuria and urgency.       1-2x/night  Musculoskeletal:  Positive for arthralgias, back pain and gait problem.  Skin:  Negative for color change.  Neurological:  Negative for facial asymmetry, weakness and light-headedness.  Psychiatric/Behavioral:  Negative for behavioral problems and sleep disturbance. The patient is not nervous/anxious.     Immunization History  Administered Date(s) Administered   Fluad Quad(high Dose 65+) 12/27/2018   Influenza Whole 12/27/2010   Influenza, High Dose Seasonal PF 12/06/2017   Influenza-Unspecified 10/20/2014, 12/19/2016, 12/02/2020, 01/03/2022, 01/10/2022, 01/03/2023   Moderna Sars-Covid-2 Vaccination 03/25/2019, 04/22/2019, 01/28/2020, 08/18/2020   Pfizer Covid-19 Vaccine Bivalent Booster 74yrs & up 12/02/2020, 01/20/2022   Pneumococcal Conjugate-13 10/09/2014   Pneumococcal-Unspecified 09/19/2014   Respiratory Syncytial Virus Vaccine,Recomb Aduvanted(Arexvy) 03/18/2022   Tdap 03/03/2015   Unspecified SARS-COV-2 Vaccination 02/08/2023   Zoster Recombinant(Shingrix) 12/06/2017, 02/20/2018   Zoster, Live 09/19/2014   Pertinent  Health Maintenance Due  Topic Date Due   HEMOGLOBIN A1C  10/11/2022   INFLUENZA VACCINE  10/20/2023   MAMMOGRAM  05/30/2024   FOOT EXAM  07/05/2024   DEXA SCAN  Completed   OPHTHALMOLOGY EXAM  Discontinued      04/07/2022    8:28 AM 04/13/2022    9:01 AM 12/30/2022   10:35 AM 04/06/2023    1:27 PM 04/14/2023    9:44 AM  Fall Risk  Falls in the past year? 0 0 0 0 0  Was there an injury with Fall? 0 0 0  0  Fall Risk Category Calculator 0 0 0  0  Patient at Risk for Falls Due to No Fall Risks No Fall Risks No Fall Risks No Fall Risks No Fall Risks  Fall risk Follow up Falls evaluation completed Falls evaluation completed Education provided;Falls evaluation completed;Falls prevention discussed Falls evaluation completed Falls  evaluation completed;Education provided;Falls prevention discussed   Functional Status Survey:    Vitals:   07/05/23 1107  BP: 138/84  Pulse: 71  Temp: (!) 97.3 F (36.3 C)  SpO2: 96%  Weight: 200 lb (90.7 kg)  Height: 5\' 1"  (1.549 m)   Body mass index is 37.79 kg/m. Physical Exam Constitutional:      Appearance: Normal appearance.  HENT:     Head: Normocephalic.     Comments:  There is indented line through forehead extended to the left parietal region, no apparent scar,     Nose: Nose normal.     Mouth/Throat:     Mouth: Mucous membranes are moist.  Eyes:     Extraocular Movements: Extraocular movements intact.     Conjunctiva/sclera: Conjunctivae normal.     Pupils: Pupils are equal, round, and reactive to light.  Cardiovascular:     Rate and Rhythm: Normal rate and regular rhythm.     Heart sounds: No murmur heard.    Comments: Weak DP pulse left>>left Pulmonary:     Effort: Pulmonary effort is normal.     Breath sounds: No rales.  Abdominal:     General: Bowel sounds are normal.     Palpations: Abdomen is soft.     Tenderness: There is no abdominal tenderness.  Musculoskeletal:     Cervical back: Normal range of motion and neck supple.     Right lower leg: No edema.     Left lower leg: No edema.     Comments: Not new, neck pain, lower back pain radiating to the left buttock/thigh/knee. Left 2nd hammer toe.  Skin:    General: Skin is warm and dry.  Neurological:     General: No focal deficit present.     Mental Status: She is alert and oriented to person, place, and time. Mental status is at baseline.     Gait: Gait normal.  Psychiatric:        Mood and Affect: Mood normal.        Behavior: Behavior normal.        Thought Content: Thought content normal.        Judgment: Judgment normal.     Labs reviewed: Recent Labs    03/29/23 1105  NA 138  K 4.7  CL 102  CO2 29  GLUCOSE 129*  BUN 30*  CREATININE 1.10  CALCIUM 9.2   Recent Labs     03/29/23 1105  AST 21  ALT 22  ALKPHOS 85  BILITOT 0.4  PROT 6.6  ALBUMIN 4.3   Recent Labs    03/29/23 1105  WBC 9.7  NEUTROABS 7.1  HGB 12.8  HCT 39.6  MCV 96.5  PLT 305.0   Lab Results  Component Value Date   TSH 1.99 11/30/2021   Lab Results  Component Value Date   HGBA1C 7.2 (H) 04/12/2022   Lab Results  Component Value Date   CHOL 321 (H) 04/12/2022   HDL 47 (L) 04/12/2022   LDLCALC 209 (H) 04/12/2022   LDLDIRECT 136.7 02/15/2013   TRIG 381 (H) 04/12/2022   CHOLHDL 6.8 (H) 04/12/2022    Significant Diagnostic Results in last 30 days:  No results found.  Assessment/Plan  Type 2 diabetes mellitus with obesity (HCC) off Metformin -bloated, failed Ozempic -GI symptoms, on Glipizide , Hgb A1c 6.8 11/30/21<<7.2 04/12/22 Update Hgb A1c, ACR  Gout  uric acid 4.9 01/01/19, on Allopurinol   Anxiety Stable, on Alprazolam   prn, on Prozac  CT 06/23/21 unremarkable. TSH 1.99 11/30/21     Chronic GERD  stable on Nexium , followed by GI, Hgb 12.8 03/29/23  Essential hypertension Blood pressure is controlled, , takes Beets capsule,  Ziac  10/6.25mg  qd.   Dyslipidemia  takes Omega 3, allergic to statin. LDL 209 04/12/22  Anemia due to chronic kidney disease Hgb 12.8, Iron 59, Vit B12 >1537 03/29/23  Adnexal mass Adnexal mass/L ovarian cystic lesion 6.9 cm, benign? 04/04/23 biopsy endometrium benign,  f/u USOBGYN evaluation for US   Dysuria UA C/S, dysuria and lower back pain.    Family/ staff Communication: plan of care reviewed with the patient and charge nurse.   Labs/tests ordered:  UA C/S today, A1c and ACR 07/11/23

## 2023-07-06 NOTE — Telephone Encounter (Signed)
 Outgoing call placed to [facility] to schedule a pick-up at [facility] with Larinda Plover and confirmation number is [id number]   A manual requisition (we were unable to access printer in there clinic) was filled out by Ammon Bales (independent living nurse), whom also collected specimen from patient and placed in the lock box outside of [place] (back entrance).

## 2023-07-06 NOTE — Assessment & Plan Note (Signed)
 UA C/S, dysuria and lower back pain.

## 2023-07-06 NOTE — Assessment & Plan Note (Signed)
 Hgb 12.8, Iron 59, Vit B12 >1537 03/29/23

## 2023-07-06 NOTE — Assessment & Plan Note (Signed)
 off Metformin-bloated, failed Ozempic-GI symptoms, on Glipizide, Hgb A1c 6.8 11/30/21<<7.2 04/12/22 Update Hgb A1c, ACR

## 2023-07-06 NOTE — Assessment & Plan Note (Signed)
uric acid 4.9 01/01/19, on Allopurinol

## 2023-07-06 NOTE — Assessment & Plan Note (Signed)
 Blood pressure is controlled, , takes Beets capsule,  Ziac 10/6.25mg  qd.

## 2023-07-06 NOTE — Patient Instructions (Addendum)
 Urine culture today Labs 07/11/23 F/u 4 months.

## 2023-07-06 NOTE — Assessment & Plan Note (Signed)
 Stable, on Alprazolam  prn, on Prozac CT 06/23/21 unremarkable. TSH 1.99 11/30/21

## 2023-07-06 NOTE — Assessment & Plan Note (Signed)
takes Omega 3, allergic to statin. LDL 209 04/12/22

## 2023-07-06 NOTE — Assessment & Plan Note (Signed)
 stable on Nexium, followed by GI, Hgb 12.8 03/29/23

## 2023-07-06 NOTE — Assessment & Plan Note (Signed)
 Adnexal mass/L ovarian cystic lesion 6.9 cm, benign? 04/04/23 biopsy endometrium benign,  f/u USOBGYN evaluation for US 

## 2023-07-10 ENCOUNTER — Encounter: Payer: Self-pay | Admitting: Nurse Practitioner

## 2023-07-12 ENCOUNTER — Encounter (HOSPITAL_BASED_OUTPATIENT_CLINIC_OR_DEPARTMENT_OTHER): Payer: Self-pay | Admitting: Obstetrics & Gynecology

## 2023-07-12 ENCOUNTER — Ambulatory Visit (HOSPITAL_BASED_OUTPATIENT_CLINIC_OR_DEPARTMENT_OTHER): Payer: Medicare PPO

## 2023-07-12 ENCOUNTER — Ambulatory Visit (HOSPITAL_BASED_OUTPATIENT_CLINIC_OR_DEPARTMENT_OTHER): Payer: Medicare PPO | Admitting: Obstetrics & Gynecology

## 2023-07-12 ENCOUNTER — Encounter (HOSPITAL_BASED_OUTPATIENT_CLINIC_OR_DEPARTMENT_OTHER): Payer: Self-pay | Admitting: *Deleted

## 2023-07-12 VITALS — BP 148/61 | HR 61 | Ht 61.0 in | Wt 203.0 lb

## 2023-07-12 DIAGNOSIS — N840 Polyp of corpus uteri: Secondary | ICD-10-CM

## 2023-07-12 DIAGNOSIS — I10 Essential (primary) hypertension: Secondary | ICD-10-CM | POA: Diagnosis not present

## 2023-07-12 DIAGNOSIS — N9489 Other specified conditions associated with female genital organs and menstrual cycle: Secondary | ICD-10-CM

## 2023-07-12 DIAGNOSIS — N83202 Unspecified ovarian cyst, left side: Secondary | ICD-10-CM | POA: Diagnosis not present

## 2023-07-12 DIAGNOSIS — R935 Abnormal findings on diagnostic imaging of other abdominal regions, including retroperitoneum: Secondary | ICD-10-CM

## 2023-07-12 NOTE — Progress Notes (Addendum)
 Cardiology Office Note:   Date:  07/17/2023  ID:  KASIYA BURCK, DOB Jan 21, 1941, MRN [id number] PCP:  Mast, Man X, NP  [facility] Providers Cardiologist:  Wendel Haws, MD Referring MD: Mast, Man X, NP  Chief Complaint/Reason for Referral: Dyspnea chest pain, pre-op for BSO ASSESSMENT:    1. Precordial pain   2. Type 2 diabetes mellitus with complication, without long-term current use of insulin (HCC)   3. Hypertension associated with diabetes (HCC)   4. Hyperlipidemia associated with type 2 diabetes mellitus (HCC)   5. Aortic atherosclerosis (HCC)   6. Stage 3a chronic kidney disease (HCC)   7. BMI 38.0-38.9,adult   8. Preoperative cardiovascular examination   9. Other fatigue   10. DOE (dyspnea on exertion)     PLAN:   In order of problems listed above: Chest pain:  We will obtain a coronary CTA and echocardiogram to evaluate further.  Only if high risk findings are found would further evaluation and treatment from cardiovascular perspective be required.   T2DM: Would benefit from antiplatelet therapy given history of diabetes.  She does have issues with GERD and peptic ulcer disease.  She is on Nexium  40 mg.  Her last episode of peptic ulcer disease was several years ago.  Will trial Plavix  75 mg instead of aspirin . Would benefit from being on a statin but is intolerant to multiple statins.  Given CKD stage IIIa we will start Micardis  40 mg mg daily; BMP next week.  Has had issues with urinary tract infections and urinary frequency so we will defer SGLT2 inhibitor for now. Hypertension: Continue bisoprolol /hydrochlorothiazide  10 x 6.25 and start Micardis  40 mg. Hyperlipidemia, intolerant to multiple statins.  Given her advanced age and DNR status will defer intensive lipid-lowering. Aortic atherosclerosis: Start plavix  75 but will defer intensive lipid-lowering as above. Stage IIIa chronic kidney disease: Start losartan  25 mg and defer SGLT2 inhibitor as above.   Elevated BMI:  Intolerant of Ozempic . Preoperative cardiovascular examination: See discussion #1 above. Fatigue:  Obtain sleep study due to snoring and daytime somnolence.            Dispo:  Return in about 6 months (around 01/16/2024).      Medication Adjustments/Labs and Tests Ordered: Current medicines are reviewed at length with the patient today.  Concerns regarding medicines are outlined above.  The following changes have been made:     Labs/tests ordered: Orders Placed This Encounter  Procedures   CT CORONARY MORPH W/CTA COR W/SCORE W/CA W/CM &/OR WO/CM   Basic metabolic panel with GFR   EKG 87-Ozji   ECHOCARDIOGRAM COMPLETE   Home sleep test    Medication Changes: Meds ordered this encounter  Medications   clopidogrel  (PLAVIX ) 75 MG tablet    Sig: Take 1 tablet (75 mg total) by mouth daily.    Dispense:  90 tablet    Refill:  3   telmisartan  (MICARDIS ) 40 MG tablet    Sig: Take 1 tablet (40 mg total) by mouth daily.    Dispense:  90 tablet    Refill:  3   metoprolol  tartrate (LOPRESSOR ) 25 MG tablet    Sig: Take 1 tablet (25 mg total) by mouth once for 1 dose. Take 90-120 minutes prior to scan. Hold for SBP less than 110.    Dispense:  1 tablet    Refill:  0    Current medicines are reviewed at length with the patient today.  The patient does not have concerns regarding  medicines.     History of Present Illness:      FOCUSED PROBLEM LIST:   T2DM Not on insulin Hypertension Hyperlipidemia Intolerant of statins Aortic atherosclerosis CT abdomen pelvis 2025 CKD stage IIIa BMI 38 Unable to tolerate Ozempic  Hiatal hernia and peptic ulcer disease DNR  April 2025:  Patient consents to use of AI scribe. Patient is 83 year old female with the above listed medical problems referred by the GI department for chest pain and dyspnea.  The patient was seen by GI recently due to her history of peptic ulcer disease and hiatal hernia.  At that appointment she reported dyspnea  on exertion and chest discomfort.  She also was scheduled for BSO for an ovarian cyst and so she is here for preoperative indication.  She is scheduled for surgery to remove an ovarian cyst, which is the size of an orange, and the left ovary to prevent future issues. The cyst has been monitored for some time.  She experiences significant fatigue and shortness of breath, with no chest pain. She notes occasional tightness in her bra and numbness in her arm, though these symptoms do not consistently occur with physical activity. Her physical activity has decreased due to fatigue, but she has resumed walking in the pool twice a week. She spends her time reading and watching TV.  She has a history of diabetes, which she states is under control. She is not on aspirin  due to a past significant bleed and stomach ulcers. Her current medications include two Tylenol  extra strength in the morning, two arthritis-strength Tylenol  at night, and Nexium  for stomach protection.  She mentions a past episode of stopped breathing during sleep, suggesting possible sleep apnea. She takes frequent afternoon naps and feels drowsy during the day.  She has experienced urinary symptoms, including frequent urination and difficulty holding urine, suspecting a urinary tract infection. A urine sample was taken, but results are pending.  She cannot tolerate cholesterol medications and has had issues with anti-inflammatories due to a previous bleed        Current Medications: Current Meds  Medication Sig   Accu-Chek FastClix Lancets MISC USE AS DIRECTED.   acetaminophen  (TYLENOL ) 500 MG tablet Take 1,000 mg by mouth every morning.   acetaminophen  (TYLENOL ) 650 MG CR tablet Take 1,300 mg by mouth at bedtime.   ALPRAZolam  (XANAX ) 0.25 MG tablet TAKE ONE TABLET BY MOUTH TWICE DAILY AS NEEDED   bisoprolol -hydrochlorothiazide  (ZIAC ) 10-6.25 MG tablet TAKE ONE TABLET BY MOUTH DAILY   CALCIUM PO Take 600 mg by mouth daily.    Cholecalciferol (VITAMIN D3) 50 MCG (2000 UT) capsule Take 2,000 Units by mouth daily.   clopidogrel  (PLAVIX ) 75 MG tablet Take 1 tablet (75 mg total) by mouth daily.   cyanocobalamin  (VITAMIN B12) 1000 MCG tablet Take 1 tablet (1,000 mcg total) by mouth 2 (two) times a week.   esomeprazole  (NEXIUM ) 40 MG capsule Take 1 capsule (40 mg total) by mouth daily.   FLUoxetine  (PROZAC ) 40 MG capsule TAKE ONE CAPSULE BY MOUTH EVERY DAY.   glipiZIDE  (GLUCOTROL ) 5 MG tablet Take 1 tablet (5 mg total) by mouth 2 (two) times daily before a meal.   glucose blood (ACCU-CHEK SMARTVIEW) test strip CHECK BLOOD SUGAR 3 TIMES DAILY AS DIRECTED.   metoprolol  tartrate (LOPRESSOR ) 25 MG tablet Take 1 tablet (25 mg total) by mouth once for 1 dose. Take 90-120 minutes prior to scan. Hold for SBP less than 110.   Multiple Vitamins-Minerals (PRESERVISION AREDS PO) Take 1  tablet by mouth in the morning and at bedtime.   nystatin  (MYCOSTATIN /NYSTOP ) powder Apply 1 Application topically as needed.   Omega-3 Fatty Acids (FISH OIL) 1000 MG CAPS Take 1 capsule by mouth daily.   telmisartan  (MICARDIS ) 40 MG tablet Take 1 tablet (40 mg total) by mouth daily.   triamcinolone  cream (KENALOG ) 0.1 % Apply 1 Application topically 2 (two) times daily. Apply 2 times daily to the back for up to 2 weeks then STOP, Resume in 2 weeks if irritation is not resolved     Review of Systems:   Please see the history of present illness.    All other systems reviewed and are negative.     EKGs/Labs/Other Test Reviewed:   EKG: 2017 sinus rhythm  EKG Interpretation Date/Time:  Monday July 17 2023 10:22:37 EDT Ventricular Rate:  58 PR Interval:  168 QRS Duration:  84 QT Interval:  424 QTC Calculation: 416 R Axis:   12  Text Interpretation: Sinus bradycardia Minimal voltage criteria for LVH, may be normal variant ( R in aVL ) When compared with ECG of 16-Jan-2009 13:39, No significant change was found Confirmed by Wendel Haws ([id number]) on  07/17/2023 10:27:58 AM         Risk Assessment/Calculations:          Physical Exam:   VS:  BP (!) 150/80   Pulse (!) 58   Ht 5' 1.5 (1.562 m)   Wt 200 lb 6.4 oz (90.9 kg)   SpO2 94%   BMI 37.25 kg/m    HYPERTENSION CONTROL Vitals:   07/17/23 1017 07/17/23 1043  BP: (!) 150/72 (!) 150/80    The patient's blood pressure is elevated above target today.  In order to address the patient's elevated BP: A new medication was prescribed today.      Wt Readings from Last 3 Encounters:  07/17/23 200 lb 6.4 oz (90.9 kg)  07/12/23 203 lb (92.1 kg)  07/05/23 200 lb (90.7 kg)      GENERAL:  No apparent distress, AOx3 HEENT:  No carotid bruits, +2 carotid impulses, no scleral icterus CAR: RRR no murmurs, gallops, rubs, or thrills RES:  Clear to auscultation bilaterally ABD:  Soft, nontender, nondistended, positive bowel sounds x 4 VASC:  +2 radial pulses, +2 carotid pulses NEURO:  CN 2-12 grossly intact; motor and sensory grossly intact PSYCH:  No active depression or anxiety EXT:  No edema, ecchymosis, or cyanosis  Signed, Amberlyn Martinezgarcia K Shara Hartis, MD  07/17/2023 11:28 AM    [facility] [address] [place], [place], [place]  [address] Phone: [phone]; Fax: [phone]   Note:  This document was prepared using Dragon voice recognition software and may include unintentional dictation errors.

## 2023-07-13 ENCOUNTER — Telehealth: Payer: Self-pay | Admitting: *Deleted

## 2023-07-13 NOTE — Telephone Encounter (Signed)
 Pt already scheduled to see Dr. Lorie Rook 07/17/23.

## 2023-07-13 NOTE — Telephone Encounter (Signed)
    Primary Cardiologist:None  Chart reviewed as part of pre-operative protocol coverage. Because of Karinne Schmader Valenti's past medical history and time since last visit, he/she will require a follow-up visit in order to better assess preoperative cardiovascular risk.  Pre-op covering staff: - Please schedule in office appointment and call patient to inform them. - Please contact requesting surgeon's office via preferred method (i.e, phone, fax) to inform them of need for appointment prior to surgery.  If applicable, this message will also be routed to pharmacy pool and/or primary cardiologist for input on holding anticoagulant/antiplatelet agent as requested below so that this information is available at time of patient's appointment.   Carie Charity, NP  07/13/2023, 3:34 PM

## 2023-07-13 NOTE — Telephone Encounter (Signed)
   Pre-operative Risk Assessment    Patient Name: Brittany Mcclure  DOB: November 06, 1940 MRN: [id number]   Date of last office visit: PT HAS NEW PT APPT 07/17/23 APPT NOTES ALREADY UPDATED Date of next office visit:    Request for Surgical Clearance    Procedure:   BSO DUE TO OVARIAN CYST  Date of Surgery:  Clearance TBD                                Surgeon:  Lillian Rein, MD Surgeon's Group or Practice Name:  [facility] Phone number:  [phone] Fax number:  [phone]   Type of Clearance Requested:   - Medical    Type of Anesthesia:  Not Indicated   Additional requests/questions:    [name]   07/13/2023, 3:10 PM

## 2023-07-15 ENCOUNTER — Encounter (HOSPITAL_BASED_OUTPATIENT_CLINIC_OR_DEPARTMENT_OTHER): Payer: Self-pay | Admitting: Obstetrics & Gynecology

## 2023-07-15 NOTE — Progress Notes (Signed)
 GYNECOLOGY  VISIT  CC:   Discuss ultrasound results, h/o left ovarian cyst ~8cm  HPI: 83 y.o. G2P2 Widowed White or Caucasian female here for discussion of ultrasound results.  Ultrasound obtained to recheck ovarian cyst noted on CT and ultrasound in January.  At that time, pt had been struggling with a URI that was difficult to get rid of and she really wasn't interested in any surgery.  Ca-125 was normal.  Follow up imaging recommended to watch for change.  She does report some intermittent left sided pain from time to time that she feels is likely the cyst now that she knows it is present.  Ultrasound today showed thickened endometrium with findings c/w probable polyp as well as continued enlarged cyst on the left.  Right ovary not well seen but a probably hydrosalpinx noted.  No free fluid in pelvis noted.  Reviewed images and results.  Pt would like to proceed with surgical removal.  We discussed laparoscopic BSO if possible with hysteroscopy and polyp removal, D&C as well.  Procedures discussed with patient.  Hospital stay, recovery and pain management all discussed.  Risks discussed including but not limited to bleeding, <1% risk of receiving a  transfusion, infection, <1% risk of bowel/bladder/ureteral/vascular injury discussed as well as possible need for additional surgery if injury does occur discussed.  DVT/PE and rare risk of death discussed.  My actual complications with prior surgeries discussed. Hernia formation discussed.  Positioning and incision locations discussed.  Patient aware if pathology abnormal she may need additional treatment.  All questions answered.  . Pt ready to proceed with scheduling.    Past Medical History:  Diagnosis Date   Allergy    Anemia    Anxiety    Arthritis    with gout   Carotid bruit    right   Cataract    bil cataracts removed   Chest pain    Chronic kidney disease    Colon polyps    Depression    Diabetes type 2, uncontrolled     Diverticulosis    Duodenitis    Exogenous obesity    Gastric ulcer    GERD (gastroesophageal reflux disease)    Hyperlipidemia    Hypertension    Metabolic syndrome    Osteopenia    Seasonal allergies    seasonal   Tubular adenoma of colon     MEDS:   Current Outpatient Medications on File Prior to Visit  Medication Sig Dispense Refill   Accu-Chek FastClix Lancets MISC USE AS DIRECTED. 200 each 2   acetaminophen (TYLENOL) 500 MG tablet Take 1,000 mg by mouth every morning.     acetaminophen (TYLENOL) 650 MG CR tablet Take 1,300 mg by mouth at bedtime.     ALPRAZolam  (XANAX ) 0.25 MG tablet TAKE ONE TABLET BY MOUTH TWICE DAILY AS NEEDED 60 tablet 1   bisoprolol -hydrochlorothiazide  (ZIAC ) 10-6.25 MG tablet TAKE ONE TABLET BY MOUTH DAILY 90 tablet 1   CALCIUM PO Take 600 mg by mouth daily.     Cholecalciferol (VITAMIN D3) 50 MCG (2000 UT) capsule Take 2,000 Units by mouth daily.     cyanocobalamin  (VITAMIN B12) 1000 MCG tablet Take 1 tablet (1,000 mcg total) by mouth 2 (two) times a week.     esomeprazole  (NEXIUM ) 40 MG capsule Take 1 capsule (40 mg total) by mouth daily. 90 capsule 3   FLUoxetine  (PROZAC ) 40 MG capsule TAKE ONE CAPSULE BY MOUTH EVERY DAY. 90 capsule 1   glipiZIDE  (GLUCOTROL ) 5  MG tablet Take 1 tablet (5 mg total) by mouth 2 (two) times daily before a meal. 180 tablet 1   glucose blood (ACCU-CHEK SMARTVIEW) test strip CHECK BLOOD SUGAR 3 TIMES DAILY AS DIRECTED. 200 strip 2   Multiple Vitamins-Minerals (PRESERVISION AREDS PO) Take 1 tablet by mouth in the morning and at bedtime.     nystatin  (MYCOSTATIN /NYSTOP ) powder Apply 1 Application topically as needed.     Omega-3 Fatty Acids (FISH OIL) 1000 MG CAPS Take 1 capsule by mouth daily.     triamcinolone  cream (KENALOG ) 0.1 % Apply 1 Application topically 2 (two) times daily. Apply 2 times daily to the back for up to 2 weeks then STOP, Resume in 2 weeks if irritation is not resolved 453.6 g 2   No current  facility-administered medications on file prior to visit.    ALLERGIES: Crestor [rosuvastatin calcium], Elemental sulfur, Gemfibrozil , Iodine, Lipitor [atorvastatin calcium], Lovastatin, Sulfonamide derivatives, and Zetia  [ezetimibe ]  SH:  widowed, non smoker  Review of Systems  Constitutional: Negative.   Gastrointestinal:        LLQ pain, at times  Genitourinary: Negative.     PHYSICAL EXAMINATION:    BP (!) 148/61 (BP Location: Left Arm, Patient Position: Sitting, Cuff Size: Normal)   Pulse 61   Ht 5\' 1"  (1.549 m)   Wt 203 lb (92.1 kg)   BMI 38.36 kg/m      Physical Exam Constitutional:      Appearance: Normal appearance.  Cardiovascular:     Rate and Rhythm: Normal rate and regular rhythm.  Pulmonary:     Breath sounds: Normal breath sounds.  Neurological:     General: No focal deficit present.     Mental Status: She is alert.  Psychiatric:        Mood and Affect: Mood normal.     Assessment/Plan: 1. Adnexal mass (Primary) - will proceed with scheduling laparoscopic BSO.  If right ovary had significant adhesions, she would prefer to leave in place if looks normal vs increased surgical risks.  She knows I would plan to remove tube if possible - Ambulatory Referral For Surgery Scheduling  2. Abnormal ultrasound of endometrium - will also plan hysteroscopy with polyp resection, D&C at same time - Ambulatory Referral For Surgery Scheduling  3. Endometrial polyp  4. Essential hypertension - is seeing cardiology for first time next week.  Will request cardiac clearance for surgery as well.

## 2023-07-17 ENCOUNTER — Encounter: Payer: Self-pay | Admitting: Internal Medicine

## 2023-07-17 ENCOUNTER — Ambulatory Visit: Attending: Internal Medicine | Admitting: Internal Medicine

## 2023-07-17 VITALS — BP 150/80 | HR 58 | Ht 61.5 in | Wt 200.4 lb

## 2023-07-17 DIAGNOSIS — E1159 Type 2 diabetes mellitus with other circulatory complications: Secondary | ICD-10-CM

## 2023-07-17 DIAGNOSIS — R5383 Other fatigue: Secondary | ICD-10-CM | POA: Diagnosis not present

## 2023-07-17 DIAGNOSIS — E118 Type 2 diabetes mellitus with unspecified complications: Secondary | ICD-10-CM

## 2023-07-17 DIAGNOSIS — I700 Atherosclerosis of aorta: Secondary | ICD-10-CM | POA: Diagnosis not present

## 2023-07-17 DIAGNOSIS — I152 Hypertension secondary to endocrine disorders: Secondary | ICD-10-CM

## 2023-07-17 DIAGNOSIS — E1169 Type 2 diabetes mellitus with other specified complication: Secondary | ICD-10-CM | POA: Diagnosis not present

## 2023-07-17 DIAGNOSIS — R072 Precordial pain: Secondary | ICD-10-CM

## 2023-07-17 DIAGNOSIS — Z01810 Encounter for preprocedural cardiovascular examination: Secondary | ICD-10-CM

## 2023-07-17 DIAGNOSIS — R0609 Other forms of dyspnea: Secondary | ICD-10-CM

## 2023-07-17 DIAGNOSIS — E785 Hyperlipidemia, unspecified: Secondary | ICD-10-CM

## 2023-07-17 DIAGNOSIS — Z6838 Body mass index (BMI) 38.0-38.9, adult: Secondary | ICD-10-CM

## 2023-07-17 DIAGNOSIS — N1831 Chronic kidney disease, stage 3a: Secondary | ICD-10-CM | POA: Diagnosis not present

## 2023-07-17 MED ORDER — TELMISARTAN 40 MG PO TABS: 40.0000 mg | ORAL_TABLET | Freq: Every day | ORAL | 3 refills | Status: DC

## 2023-07-17 MED ORDER — METOPROLOL TARTRATE 25 MG PO TABS: 25.0000 mg | ORAL_TABLET | Freq: Once | ORAL | 0 refills | Status: DC

## 2023-07-17 MED ORDER — CLOPIDOGREL BISULFATE 75 MG PO TABS: 75.0000 mg | ORAL_TABLET | Freq: Every day | ORAL | 3 refills | Status: DC

## 2023-07-17 NOTE — Patient Instructions (Addendum)
 Medication Instructions:  Your physician has recommended you make the following change in your medication:   1) START telmisartan (Micardis) 40 mg daily 2) START clopidogrel (Plavix) 75 mg daily  *If you need a refill on your cardiac medications before your next appointment, please call your pharmacy*  Lab Work: Next week: BMP If you have labs (blood work) drawn today and your tests are completely normal, you will receive your results only by: MyChart Message (if you have MyChart) OR A paper copy in the mail If you have any lab test that is abnormal or we need to change your treatment, we will call you to review the results.  Testing/Procedures: Your physician has requested that you have an echocardiogram. Echocardiography is a painless test that uses sound waves to create images of your heart. It provides your doctor with information about the size and shape of your heart and how well your heart's chambers and valves are working. This procedure takes approximately one hour. There are no restrictions for this procedure. Please do NOT wear cologne, perfume, aftershave, or lotions (deodorant is allowed). Please arrive 15 minutes prior to your appointment time.  Please note: We ask at that you not bring children with you during ultrasound (echo/ vascular) testing. Due to room size and safety concerns, children are not allowed in the ultrasound rooms during exams. Our front office staff cannot provide observation of children in our lobby area while testing is being conducted. An adult accompanying a patient to their appointment will only be allowed in the ultrasound room at the discretion of the ultrasound technician under special circumstances. We apologize for any inconvenience.  Your physician has recommended that you have a sleep study. This test records several body functions during sleep, including: brain activity, eye movement, oxygen and carbon dioxide blood levels, heart rate and rhythm,  breathing rate and rhythm, the flow of air through your mouth and nose, snoring, body muscle movements, and chest and belly movement.   Your physician has requested that you have cardiac CT. Cardiac computed tomography (CT) is a painless test that uses an x-ray machine to take clear, detailed pictures of your heart. For further information please visit [email or web address] Please follow instruction sheet as given.   Follow-Up: At [facility], you and your health needs are our priority.  As part of our continuing mission to provide you with exceptional heart care, our providers are all part of one team.  This team includes your primary Cardiologist (physician) and Advanced Practice Providers or APPs (Physician Assistants and Nurse Practitioners) who all work together to provide you with the care you need, when you need it.  Your next appointment:   6 month(s)  Provider:   Lovette Rud, PA-C, Charles Connor, NP, Graciela Lava, PA-C, Theotis Flake, PA-C, Marlyse Single, PA-C, or Leala Prince, PA-C   We recommend signing up for the patient portal called "MyChart".  Sign up information is provided on this After Visit Summary.  MyChart is used to connect with patients for Virtual Visits (Telemedicine).  Patients are able to view lab/test results, encounter notes, upcoming appointments, etc.  Non-urgent messages can be sent to your provider as well.   To learn more about what you can do with MyChart, go to [email or web address].  .  Your cardiac CT will be scheduled at one of the below locations:   [facility] [address] [place], [place] [address] [phone]  OR  [facility] [address]  Geraldene Kleine [place], [place] [address] [phone]  OR   [facility] [address] [place], [place] [address] [phone]  OR   [facility] [address] [place], [place] [address] [phone]  OR   [facility] and Vascular Tower [address]  [place], [place] [address] Opening July 17, 2023  If scheduled at [facility], please arrive at the [facility] and Children's Entrance (Entrance C2) of [facility] 30 minutes prior to test start time. You can use the FREE valet parking offered at entrance C (encouraged to control the heart rate for the test)  Proceed to the [facility] Radiology Department (first floor) to check-in and test prep.   All radiology patients and guests should use entrance C2 at [facility], accessed from [facility], even though the hospital's physical address listed is [address].    If scheduled at the Heart and Vascular Tower at [address], please enter the parking lot using the [address] entrance and use the FREE valet service at the patient drop-off area. Enter the buidling and check-in with registration on the main floor.  If scheduled at [facility] or [facility], please arrive 15 mins early for check-in and test prep.  There is spacious parking and easy access to the radiology department from the [facility] Heart and Vascular entrance. Please enter here and check-in with the desk attendant.   If scheduled at [facility], please arrive 30 minutes early for check-in and test prep.  Please follow these instructions carefully (unless otherwise directed):  An IV will be required for this test and Nitroglycerin will be given.  Hold all erectile dysfunction medications at least 3 days (72 hrs) prior to test. (Ie viagra, cialis, sildenafil, tadalafil, etc)   On the Night Before the Test: Be sure to Drink plenty of water. Do not consume any caffeinated/decaffeinated beverages or chocolate 12 hours prior to your test. Do not take any antihistamines 12 hours prior to your test.  On the Day of the Test: Drink plenty of water  until 1 hour prior to the test. Do not eat any food 1 hour prior to test. You may take your regular medications prior to the test.  Take metoprolol (Lopressor) 25 mg  two hours prior to test. Patients who wear a continuous glucose monitor MUST remove the device prior to scanning. FEMALES- please wear underwire-free bra if available, avoid dresses & tight clothing  After the Test: Drink plenty of water. After receiving IV contrast, you may experience a mild flushed feeling. This is normal. On occasion, you may experience a mild rash up to 24 hours after the test. This is not dangerous. If this occurs, you can take Benadryl 25 mg, Zyrtec, Claritin, or Allegra and increase your fluid intake. (Patients taking Tikosyn should avoid Benadryl, and may take Zyrtec, Claritin, or Allegra) If you experience trouble breathing, this can be serious. If it is severe call 911 IMMEDIATELY. If it is mild, please call our office.  We will call to schedule your test 2-4 weeks out understanding that some insurance companies will need an authorization prior to the service being performed.   For more information and frequently asked questions, please visit our website : [email or web address]  For non-scheduling related questions, please contact the cardiac imaging nurse navigator should you have any questions/concerns: Cardiac Imaging Nurse Navigators Direct Office Dial: [phone]   For scheduling  needs, including cancellations and rescheduling, please call [place], [phone].

## 2023-07-18 ENCOUNTER — Telehealth: Payer: Self-pay

## 2023-07-18 NOTE — Telephone Encounter (Signed)
 [facility] report in portal states that Urine Culture wasn't done because it wasn't preformed by lab tech at [facility]. Message routed back to PCP Mast, Man X, NP

## 2023-07-18 NOTE — Telephone Encounter (Signed)
Message routed to PCP Mast, Man X, NP

## 2023-07-18 NOTE — Telephone Encounter (Signed)
 I don't know the results

## 2023-07-18 NOTE — Telephone Encounter (Signed)
 Copied from CRM [id number]. Topic: Clinical - Lab/Test Results >> Jul 18, 2023  9:11 AM Blair Bumpers wrote: Reason for CRM: Patient states she saw Dr. Jammie Mccune at [facility] about a week ago & had a urinalysis done. Patient states she hasn't heard anything in regards to the results. Advised pt that they results has not been read yet by provider. Please contact patient once the results has been read. CB #: [id number].

## 2023-07-18 NOTE — Telephone Encounter (Signed)
 Correction I had Beth our lab tech call and she states that neither UA/Culture was done because urine specimen was sent in a cup and for some reason the Tech at [facility] did not sent it in the tubes it was suppose to be tested in.

## 2023-07-18 NOTE — Telephone Encounter (Signed)
 Mast, Man X, NP  You24 minutes ago (10:39 AM)    Can you send the UA C/S result to me? Thanks.

## 2023-07-18 NOTE — Telephone Encounter (Signed)
 I made another attempt to contact patient. Voicemail was left again.

## 2023-07-18 NOTE — Telephone Encounter (Signed)
Called patient and no answer. Voicemail was left with office call back number.   

## 2023-07-18 NOTE — Telephone Encounter (Signed)
 Results are viewable in [facility] portal.

## 2023-07-19 NOTE — Telephone Encounter (Signed)
 Patient called the office and I spoke to her. She expressed that her frustration about [facility] clinic not having cups, wipes, large BP cuff, etc. She also stated that she's not having any other issue so she doesn't need to collect another specimen. Patient was transferred to Practice Admin Lino Richmond and she spoke to her. All concerns were addressed and followed up with Ammon Bales the Nurse at [facility] and Era supervisor for [employer]. Message routed to [email or web address] as FYI.

## 2023-07-24 NOTE — Telephone Encounter (Signed)
 Noted.

## 2023-07-25 DIAGNOSIS — I152 Hypertension secondary to endocrine disorders: Secondary | ICD-10-CM | POA: Diagnosis not present

## 2023-07-25 DIAGNOSIS — E1159 Type 2 diabetes mellitus with other circulatory complications: Secondary | ICD-10-CM | POA: Diagnosis not present

## 2023-07-26 ENCOUNTER — Telehealth (HOSPITAL_BASED_OUTPATIENT_CLINIC_OR_DEPARTMENT_OTHER): Payer: Self-pay

## 2023-07-26 DIAGNOSIS — E875 Hyperkalemia: Secondary | ICD-10-CM

## 2023-07-26 LAB — BASIC METABOLIC PANEL WITH GFR
BUN/Creatinine Ratio: 22 (ref 12–28)
BUN: 27 mg/dL (ref 8–27)
CO2: 22 mmol/L (ref 20–29)
Calcium: 9.7 mg/dL (ref 8.7–10.3)
Chloride: 102 mmol/L (ref 96–106)
Creatinine, Ser: 1.24 mg/dL — ABNORMAL HIGH (ref 0.57–1.00)
Glucose: 123 mg/dL — ABNORMAL HIGH (ref 70–99)
Potassium: 5.3 mmol/L — ABNORMAL HIGH (ref 3.5–5.2)
Sodium: 139 mmol/L (ref 134–144)
eGFR: 43 mL/min/{1.73_m2} — ABNORMAL LOW (ref >59)

## 2023-07-26 NOTE — Telephone Encounter (Addendum)
 Results called to patient who verbalizes understanding! Labs ordered.   ----- Message from Arun K Thukkani sent at 07/26/2023  8:48 AM EDT ----- Let her know that her blood work shows that her potassium level is a little bit high.  She should avoid potassium containing foods such as bananas for a few days and increase her water intake a bit.  Lets check a BMP next week.

## 2023-08-01 DIAGNOSIS — E875 Hyperkalemia: Secondary | ICD-10-CM | POA: Diagnosis not present

## 2023-08-02 ENCOUNTER — Telehealth: Payer: Self-pay

## 2023-08-02 LAB — BASIC METABOLIC PANEL WITH GFR
BUN/Creatinine Ratio: 18 (ref 12–28)
BUN: 20 mg/dL (ref 8–27)
CO2: 16 mmol/L — ABNORMAL LOW (ref 20–29)
Calcium: 9.5 mg/dL (ref 8.7–10.3)
Chloride: 104 mmol/L (ref 96–106)
Creatinine, Ser: 1.12 mg/dL — ABNORMAL HIGH (ref 0.57–1.00)
Glucose: 74 mg/dL (ref 70–99)
Potassium: 4.7 mmol/L (ref 3.5–5.2)
Sodium: 140 mmol/L (ref 134–144)
eGFR: 48 mL/min/{1.73_m2} — ABNORMAL LOW (ref >59)

## 2023-08-02 NOTE — Telephone Encounter (Signed)
 I left patient a voicemail advising Dr. Annabell Key has an opening for surgery on 09/25/23 at [facility]. I asked patient to call me to discuss her availability. [phone].

## 2023-08-03 ENCOUNTER — Telehealth: Payer: Self-pay

## 2023-08-03 ENCOUNTER — Telehealth (HOSPITAL_BASED_OUTPATIENT_CLINIC_OR_DEPARTMENT_OTHER): Payer: Self-pay | Admitting: *Deleted

## 2023-08-03 NOTE — Telephone Encounter (Signed)
 TC from pt's daughter who is on pt's current DPR.  Daughter Normajean Becket [phone] was advised by patient that you advised if she had questions regarding her mom's July 7th surgery to call and that you could speak with her regarding the surgery.  Terri Fester CMA

## 2023-08-03 NOTE — Telephone Encounter (Signed)
 Patient called to verify that she spoke w/ her daughter and she's available for surgery on 09/25/23 at [facility] w/ Dr. Annabell Key. Patient is concerned that she's diabetic and will be unable to eat. Patient would like to scheduled at 11 am w/ arrival time of 9 am. Pre-Op instructions and surgery details were provided by phone.

## 2023-08-07 ENCOUNTER — Ambulatory Visit (HOSPITAL_BASED_OUTPATIENT_CLINIC_OR_DEPARTMENT_OTHER): Payer: Self-pay | Admitting: Cardiovascular Disease

## 2023-08-09 NOTE — Telephone Encounter (Signed)
 Pt returning nurse call

## 2023-08-17 ENCOUNTER — Ambulatory Visit: Payer: Self-pay | Admitting: Internal Medicine

## 2023-08-17 ENCOUNTER — Telehealth (HOSPITAL_COMMUNITY): Payer: Self-pay | Admitting: Emergency Medicine

## 2023-08-17 ENCOUNTER — Ambulatory Visit (HOSPITAL_COMMUNITY)
Admission: RE | Admit: 2023-08-17 | Discharge: 2023-08-17 | Disposition: A | Discharge status: Home / Self Care | Source: Ambulatory Visit | Attending: Cardiology | Admitting: Cardiology

## 2023-08-17 DIAGNOSIS — R0609 Other forms of dyspnea: Secondary | ICD-10-CM

## 2023-08-17 DIAGNOSIS — Z01810 Encounter for preprocedural cardiovascular examination: Secondary | ICD-10-CM | POA: Diagnosis not present

## 2023-08-17 LAB — ECHOCARDIOGRAM COMPLETE
Area-P 1/2: 3.78 cm2
S' Lateral: 3.2 cm

## 2023-08-17 NOTE — Telephone Encounter (Signed)
 Reaching out to patient to offer assistance regarding upcoming cardiac imaging study; pt verbalizes understanding of appt date/time, parking situation and where to check in, pre-test NPO status and medications ordered, and verified current allergies; name and call back number provided for further questions should they arise Rockwell Alexandria RN Navigator Cardiac Imaging [facility] [phone] office [phone] cell

## 2023-08-18 ENCOUNTER — Ambulatory Visit (HOSPITAL_COMMUNITY)
Admission: RE | Admit: 2023-08-18 | Discharge: 2023-08-18 | Disposition: A | Discharge status: Home / Self Care | Source: Ambulatory Visit | Attending: Internal Medicine | Admitting: Internal Medicine

## 2023-08-18 DIAGNOSIS — R072 Precordial pain: Secondary | ICD-10-CM | POA: Insufficient documentation

## 2023-08-18 DIAGNOSIS — R931 Abnormal findings on diagnostic imaging of heart and coronary circulation: Secondary | ICD-10-CM | POA: Diagnosis present

## 2023-08-18 DIAGNOSIS — I2510 Atherosclerotic heart disease of native coronary artery without angina pectoris: Secondary | ICD-10-CM | POA: Diagnosis not present

## 2023-08-18 DIAGNOSIS — Z01810 Encounter for preprocedural cardiovascular examination: Secondary | ICD-10-CM | POA: Insufficient documentation

## 2023-08-18 DIAGNOSIS — E785 Hyperlipidemia, unspecified: Secondary | ICD-10-CM | POA: Diagnosis present

## 2023-08-18 DIAGNOSIS — R0609 Other forms of dyspnea: Secondary | ICD-10-CM | POA: Diagnosis present

## 2023-08-18 DIAGNOSIS — E1169 Type 2 diabetes mellitus with other specified complication: Secondary | ICD-10-CM | POA: Diagnosis not present

## 2023-08-18 DIAGNOSIS — R5383 Other fatigue: Secondary | ICD-10-CM | POA: Insufficient documentation

## 2023-08-18 DIAGNOSIS — Z6838 Body mass index (BMI) 38.0-38.9, adult: Secondary | ICD-10-CM | POA: Diagnosis not present

## 2023-08-18 DIAGNOSIS — E1159 Type 2 diabetes mellitus with other circulatory complications: Secondary | ICD-10-CM | POA: Diagnosis not present

## 2023-08-18 DIAGNOSIS — N1831 Chronic kidney disease, stage 3a: Secondary | ICD-10-CM | POA: Diagnosis not present

## 2023-08-18 DIAGNOSIS — E118 Type 2 diabetes mellitus with unspecified complications: Secondary | ICD-10-CM | POA: Insufficient documentation

## 2023-08-18 DIAGNOSIS — I700 Atherosclerosis of aorta: Secondary | ICD-10-CM | POA: Insufficient documentation

## 2023-08-18 DIAGNOSIS — I152 Hypertension secondary to endocrine disorders: Secondary | ICD-10-CM | POA: Insufficient documentation

## 2023-08-18 MED ORDER — NITROGLYCERIN 0.4 MG SL SUBL
0.8000 mg | SUBLINGUAL_TABLET | Freq: Once | SUBLINGUAL | Status: AC
  Administered 2023-08-18: 0.8 mg via SUBLINGUAL

## 2023-08-18 MED ORDER — METOPROLOL TARTRATE 5 MG/5ML IV SOLN: 10.0000 mg | INTRAVENOUS | Status: DC | PRN

## 2023-08-18 MED ORDER — IOHEXOL 350 MG/ML SOLN
100.0000 mL | Freq: Once | INTRAVENOUS | Status: AC | PRN
  Administered 2023-08-18: 100 mL via INTRAVENOUS

## 2023-08-18 MED ORDER — DILTIAZEM HCL 25 MG/5ML IV SOLN: 10.0000 mg | INTRAVENOUS | Status: DC | PRN

## 2023-08-20 ENCOUNTER — Other Ambulatory Visit: Payer: Self-pay | Admitting: Cardiology

## 2023-08-20 ENCOUNTER — Ambulatory Visit (HOSPITAL_BASED_OUTPATIENT_CLINIC_OR_DEPARTMENT_OTHER)
Admission: RE | Admit: 2023-08-20 | Discharge: 2023-08-20 | Disposition: A | Discharge status: Home / Self Care | Payer: Self-pay | Source: Ambulatory Visit | Attending: Cardiology | Admitting: Cardiology

## 2023-08-20 DIAGNOSIS — R931 Abnormal findings on diagnostic imaging of heart and coronary circulation: Secondary | ICD-10-CM

## 2023-08-20 DIAGNOSIS — I2510 Atherosclerotic heart disease of native coronary artery without angina pectoris: Secondary | ICD-10-CM | POA: Diagnosis not present

## 2023-08-20 DIAGNOSIS — R072 Precordial pain: Secondary | ICD-10-CM | POA: Diagnosis not present

## 2023-08-21 ENCOUNTER — Ambulatory Visit: Payer: Self-pay | Admitting: Internal Medicine

## 2023-08-22 NOTE — Telephone Encounter (Signed)
 Patient is aware and answered all questions at this time.

## 2023-08-22 NOTE — Telephone Encounter (Signed)
 Scheduled with DOD Paulita Boss at 2:20 on 08/28/23 to establish cath.

## 2023-08-22 NOTE — Telephone Encounter (Signed)
-----   Message from Arun K Thukkani sent at 08/21/2023  7:11 AM EDT ----- Please let her know she has a blockage in the heart.  Please get her in to see me to discuss further.

## 2023-08-28 ENCOUNTER — Encounter: Payer: Self-pay | Admitting: Internal Medicine

## 2023-08-28 ENCOUNTER — Ambulatory Visit: Attending: Internal Medicine | Admitting: Internal Medicine

## 2023-08-28 VITALS — BP 180/92 | HR 94 | Ht 62.0 in | Wt 199.4 lb

## 2023-08-28 DIAGNOSIS — R931 Abnormal findings on diagnostic imaging of heart and coronary circulation: Secondary | ICD-10-CM | POA: Diagnosis not present

## 2023-08-28 DIAGNOSIS — Z01812 Encounter for preprocedural laboratory examination: Secondary | ICD-10-CM

## 2023-08-28 DIAGNOSIS — R0609 Other forms of dyspnea: Secondary | ICD-10-CM | POA: Diagnosis not present

## 2023-08-28 DIAGNOSIS — R072 Precordial pain: Secondary | ICD-10-CM | POA: Diagnosis not present

## 2023-08-28 DIAGNOSIS — Z01810 Encounter for preprocedural cardiovascular examination: Secondary | ICD-10-CM

## 2023-08-28 DIAGNOSIS — E875 Hyperkalemia: Secondary | ICD-10-CM

## 2023-08-28 MED ORDER — ASPIRIN 81 MG PO TBEC: 81.0000 mg | DELAYED_RELEASE_TABLET | Freq: Every day | ORAL | Status: AC

## 2023-08-28 NOTE — Patient Instructions (Signed)
 Medication Instructions:  Start Aspirin 81 mg once daily  Your physician recommends that you continue on your current medications as directed. Please refer to the Current Medication list given to you today.  *If you need a refill on your cardiac medications before your next appointment, please call your pharmacy*  Lab Work: CBC and BMET today If you have labs (blood work) drawn today and your tests are completely normal, you will receive your results only by: MyChart Message (if you have MyChart) OR A paper copy in the mail If you have any lab test that is abnormal or we need to change your treatment, we will call you to review the results.  Testing/Procedures: Your physician has requested that you have a cardiac catheterization. Cardiac catheterization is used to diagnose and/or treat various heart conditions. Doctors may recommend this procedure for a number of different reasons. The most common reason is to evaluate chest pain. Chest pain can be a symptom of coronary artery disease (CAD), and cardiac catheterization can show whether plaque is narrowing or blocking your heart's arteries. This procedure is also used to evaluate the valves, as well as measure the blood flow and oxygen levels in different parts of your heart. For further information please visit [email or web address] Please follow instruction sheet, as given.   Follow-Up: At [facility], you and your health needs are our priority.  As part of our continuing mission to provide you with exceptional heart care, our providers are all part of one team.  This team includes your primary Cardiologist (physician) and Advanced Practice Providers or APPs (Physician Assistants and Nurse Practitioners) who all work together to provide you with the care you need, when you need it.  Your next appointment:   1 month(s)  Provider:   Arun K Thukkani, MD or One of our Advanced Practice Providers (APPs): Melita Springer, PA-C  Friddie Jetty, NP Evaline Hill, NP  Theotis Flake, PA-C Lawana Pray, NP  Willis Harter, PA-C Lovette Rud, PA-C  Hao Meng, PA-C Ernest Dick, NP  Marlana Silvan, NP Marcie Sever, PA-C  Laquita Plant, PA-C    Dayna Dunn, PA-C  Scott Weaver, PA-C Palmer Bobo, NP Katlyn West, NP Callie [place], PA-C  Evan Williams, PA-C Sheng Haley, PA-C  Xika Zhao, NP Kathleen Johnson, PA-C    We recommend signing up for the patient portal called "MyChart".  Sign up information is provided on this After Visit Summary.  MyChart is used to connect with patients for Virtual Visits (Telemedicine).  Patients are able to view lab/test results, encounter notes, upcoming appointments, etc.  Non-urgent messages can be sent to your provider as well.   To learn more about what you can do with MyChart, go to [email or web address].   Other Instructions  [place] [facility] A DEPT OF [place]ART[facility] AT [address] A DEPT OF THE [place] [address] [place] [address] Dept: [phone] Loc: [phone]  Brittany Mcclure  08/28/2023  You are scheduled for a Cardiac Catheterization on Thursday, June 12 with Dr. Veryl Gottron End.  1. Please arrive at the [facility] (Main Entrance A) at [facility]: [address] [place], [place] [address] at 5:00 AM (This time is 2 hour(s) before your procedure to ensure your preparation).   Free valet parking service is available. You will check in at ADMITTING. The support person will be asked to wait in the waiting room.  It is OK to have someone  drop you off and come back when you are ready to be discharged.    Special note: Every effort is made to have your procedure done on time. Please understand that emergencies sometimes delay scheduled procedures.  2. Diet: Do not eat solid foods after midnight.  The patient may have clear liquids until 5am upon the day of the procedure.  3. Labs: Today 6/9  4. Medication  instructions in preparation for your procedure:   Contrast Allergy: No   Stop taking, Micardis  (Telmisartan ) Thursday, June 12,  Hold Glipizide  Thursday June 12  Take Plavix  and Aspirin 81 mg the morning of your procedure, June 12.  Hold NSAIDS (ibuprofen) the day before and the day of.  On the morning of your procedure, take your Plavix /Clopidogrel  and any morning medicines NOT listed above.  You may use sips of water.  5. Plan to go home the same day, you will only stay overnight if medically necessary. 6. Bring a current list of your medications and current insurance cards. 7. You MUST have a responsible person to drive you home. 8. Someone MUST be with you the first 24 hours after you arrive home or your discharge will be delayed. 9. Please wear clothes that are easy to get on and off and wear slip-on shoes.  Thank you for allowing us  to care for you!   -- [place] [facility]

## 2023-08-28 NOTE — H&P (View-Only) (Signed)
 Cardiology Office Note:  .    Date:  08/28/2023  ID:  TAMAIRA CIRIELLO, DOB 1940/12/05, MRN [id number] PCP: Mast, Man X, NP  [facility] Providers Cardiologist:  Kyra Phy, MD     CC: DOD visit- F/u CCTA   History of Present Illness: .    LORICE LAFAVE is a 83 y.o. female with obstructive coronary artery disease who presents for evaluation prior to a potential heart catheterization.  She experiences shortness of breath, weakness, and trembling, particularly after activities such as swimming. No significant atrial enlargement or aortic stenosis symptoms. She has a history of obstructive coronary artery disease with preserved ejection fraction, mild mitral regurgitation, and mild left ventricular hypertrophy.  She has been informed of two blockages in her coronary arteries, one serious and one less so. Her blood pressure is usually elevated, but it was significantly higher today.  She is scheduled for surgery on July 7th to remove a cyst on her left ovary, which was previously the size of an orange, and a large polyp in her uterus. She is concerned that the coronary blockages might delay this surgery. She has a history of bilateral carpal tunnel syndrome.  Discussed the use of AI scribe software for clinical note transcription with the patient, who gave verbal consent to proceed.  Relevant histories: .  Social - Married - AT Patient ROS: As per HPI.   Studies Reviewed: .     Cardiac Studies & Procedures   ______________________________________________________________________________________________     ECHOCARDIOGRAM  ECHOCARDIOGRAM COMPLETE 08/17/2023  Narrative ECHOCARDIOGRAM REPORT    Patient Name:   MIAH BOYE [facility] Date of Exam: 08/17/2023 Medical Rec #:  [id number]       Height:       61.5 in Accession #:    [id number]      Weight:       200.4 lb Date of Birth:  1940-04-24        BSA:          1.902 m Patient Age:    83 years        BP:           150/80  mmHg Patient Gender: F               HR:           82 bpm. Exam Location:  [address]  Procedure: 2D Echo, 3D Echo, Cardiac Doppler, Color Doppler and Strain Analysis (Both Spectral and Color Flow Doppler were utilized during procedure).  Indications:    R06.00 Dyspnea  History:        Patient has no prior history of Echocardiogram examinations. Signs/Symptoms:Chest Pain; Risk Factors:Hypertension, Diabetes and Dyslipidemia.  Sonographer:    Brigid Canada RDCS Referring Phys: [id number] ARUN K THUKKANI  IMPRESSIONS   1. Left ventricular ejection fraction, by estimation, is 55 to 60%. Left ventricular ejection fraction by 3D volume is 60 %. The left ventricle has normal function. The left ventricle has no regional wall motion abnormalities. There is mild left ventricular hypertrophy of the basal-septal segment. Left ventricular diastolic parameters are indeterminate. Elevated left atrial pressure. The E/e' is 16.5. The average left ventricular global longitudinal strain is -21.4 %. The global longitudinal strain is normal. 2. Right ventricular systolic function is normal. The right ventricular size is normal. There is normal pulmonary artery systolic pressure. 3. The mitral valve is normal in structure. Mild mitral valve regurgitation. No evidence of mitral stenosis. 4. The aortic valve  is tricuspid. Aortic valve regurgitation is trivial. No aortic stenosis is present. 5. The inferior vena cava is normal in size with greater than 50% respiratory variability, suggesting right atrial pressure of 3 mmHg.  Comparison(s): No prior Echocardiogram.  FINDINGS Left Ventricle: Left ventricular ejection fraction, by estimation, is 55 to 60%. Left ventricular ejection fraction by 3D volume is 60 %. The left ventricle has normal function. The left ventricle has no regional wall motion abnormalities. The average left ventricular global longitudinal strain is -21.4 %. Strain was performed  and the global longitudinal strain is normal. The left ventricular internal cavity size was normal in size. There is mild left ventricular hypertrophy of the basal-septal segment. Left ventricular diastolic parameters are indeterminate. Elevated left atrial pressure. The E/e' is 16.5.  Right Ventricle: The right ventricular size is normal. No increase in right ventricular wall thickness. Right ventricular systolic function is normal. There is normal pulmonary artery systolic pressure. The tricuspid regurgitant velocity is 2.59 m/s, and with an assumed right atrial pressure of 3 mmHg, the estimated right ventricular systolic pressure is 29.8 mmHg.  Left Atrium: Left atrial size was normal in size.  Right Atrium: Right atrial size was normal in size.  Pericardium: There is no evidence of pericardial effusion.  Mitral Valve: The mitral valve is normal in structure. Mild mitral valve regurgitation. No evidence of mitral valve stenosis.  Tricuspid Valve: The tricuspid valve is normal in structure. Tricuspid valve regurgitation is mild . No evidence of tricuspid stenosis.  Aortic Valve: The aortic valve is tricuspid. Aortic valve regurgitation is trivial. No aortic stenosis is present.  Pulmonic Valve: The pulmonic valve was not well visualized. Pulmonic valve regurgitation is not visualized. No evidence of pulmonic stenosis.  Aorta: The aortic root and ascending aorta are structurally normal, with no evidence of dilitation.  Venous: The inferior vena cava is normal in size with greater than 50% respiratory variability, suggesting right atrial pressure of 3 mmHg.  IAS/Shunts: The atrial septum is grossly normal.  Additional Comments: 3D was performed not requiring image post processing on an independent workstation and was normal.   LEFT VENTRICLE PLAX 2D LVIDd:         4.50 cm         Diastology LVIDs:         3.20 cm         LV e' medial:    6.42 cm/s LV PW:         1.10 cm         LV  E/e' medial:  17.3 LV IVS:        1.10 cm         LV e' lateral:   7.13 cm/s LVOT diam:     2.00 cm         LV E/e' lateral: 15.6 LV SV:         57 LV SV Index:   30              2D Longitudinal LVOT Area:     3.14 cm        Strain 2D Strain GLS   -21.8 % (A4C): 2D Strain GLS   -20.2 % (A3C): 2D Strain GLS   -22.3 % (A2C): 2D Strain GLS   -21.4 % Avg:  3D Volume EF LV 3D EF:    Left ventricul ar ejection fraction by 3D volume is 60 %.  3D Volume EF: 3D EF:  60 % LV EDV:       148 ml LV ESV:       59 ml LV SV:        89 ml  RIGHT VENTRICLE             IVC RV Basal diam:  3.60 cm     IVC diam: 1.60 cm RV S prime:     10.53 cm/s TAPSE (M-mode): 1.7 cm  LEFT ATRIUM             Index        RIGHT ATRIUM          Index LA diam:        4.40 cm 2.31 cm/m   RA Area:     9.78 cm LA Vol (A2C):   57.4 ml 30.18 ml/m  RA Volume:   20.70 ml 10.88 ml/m LA Vol (A4C):   55.0 ml 28.92 ml/m LA Biplane Vol: 57.3 ml 30.13 ml/m AORTIC VALVE LVOT Vmax:   84.30 cm/s LVOT Vmean:  56.367 cm/s LVOT VTI:    0.180 m  AORTA Ao Root diam: 3.30 cm Ao Asc diam:  3.50 cm  MITRAL VALVE                TRICUSPID VALVE MV Area (PHT): 3.78 cm     TR Peak grad:   26.8 mmHg MV Decel Time: 201 msec     TR Vmax:        259.00 cm/s MV E velocity: 111.00 cm/s MV A velocity: 90.40 cm/s   SHUNTS MV E/A ratio:  1.23         Systemic VTI:  0.18 m Systemic Diam: 2.00 cm  Sunit Tolia Electronically signed by Olinda Bertrand Signature Date/Time: 08/17/2023/5:38:05 PM    Final      CT SCANS  CT CORONARY MORPH W/CTA COR W/SCORE 08/18/2023  Addendum 08/23/2023  9:55 PM ADDENDUM REPORT: 08/23/2023 21:52  EXAM: OVER-READ INTERPRETATION  CT CHEST  The following report is an over-read performed by radiologist Dr. Cyndia Drape [facility] on 08/23/2023. This over-read does not include interpretation of cardiac or coronary anatomy or pathology. The coronary CTA interpretation  by the cardiologist is attached.  COMPARISON:  12/11/2006.  FINDINGS: Cardiovascular:  See findings discussed in the body of the report.  Mediastinum/Nodes: No suspicious adenopathy identified. Imaged mediastinal structures are unremarkable.  Lungs/Pleura: Imaged lungs are clear. No pleural effusion or pneumothorax.  Upper Abdomen: No acute abnormality.  Musculoskeletal: No chest wall abnormality. No acute osseous findings.  IMPRESSION: No acute extracardiac incidental findings.   Electronically Signed By: Sydell Eva M.D. On: 08/23/2023 21:52  Narrative CLINICAL DATA:  Chest pain  EXAM: Cardiac/Coronary CTA  TECHNIQUE: A non-contrast, gated CT scan was obtained with axial slices of 2.5 mm through the heart for calcium scoring. Calcium scoring was performed using the Agatston method. A 120 kV prospective, gated, contrast cardiac CT scan was obtained. Gantry rotation speed was 230 msec and collimation was 0.63 mm. Two sublingual nitroglycerin  tablets (0.8 mg) were given. The 3D data set was reconstructed with motion correction for the best systolic or diastolic phase. Images were analyzed on a dedicated workstation using MPR, MIP, and VRT modes. The patient received 95 cc of contrast.  FINDINGS: Image quality: Good  Noise artifact is: Limited.  Coronary Arteries:  Normal coronary origin.  Right dominance.  Left main: The left main is a large caliber vessel with a normal take off from the left  coronary cusp that bifurcates into a LAD and LCX. Calcified plaque in the left main causes 0-24% stenosis  Left anterior descending artery: The LAD is patent. The LAD gives off 2 patent diagonal branches. Calcified plaque in the proximal LAD causes 0-24% stenosis. Mixed plaque in mid LAD causes 50-69% stenosis  Left circumflex artery: The LCX is non-dominant and patent. Mixed plaque in the proximal LCX causes 50-69% stenosis  Right coronary artery: The RCA is  dominant with normal take off from the right coronary cusp. The RCA terminates as a PDA and right posterolateral branch. Mixed plaque in the proximal to mid RCA causes 70-99% stenosis; high risk plaque features including positive remodeling and low attenuation plaque  Right Atrium: Right atrial size is within normal limits.  Right Ventricle: The right ventricular cavity is within normal limits.  Left Atrium: Left atrial size is normal in size with no left atrial appendage filling defect.  Left Ventricle: The ventricular cavity size is within normal limits.  Pulmonary arteries: Dilated main pulmonary artery measuring 30mm  Pulmonary veins: Normal pulmonary venous drainage.  Pericardium: Normal thickness without significant effusion or calcium present.  Cardiac valves: The aortic valve is trileaflet without significant calcification. The mitral valve is normal without significant calcification.  Aorta: Normal caliber without significant disease.  Extra-cardiac findings: See attached radiology report for non-cardiac structures.  IMPRESSION: 1. Coronary calcium score of 395. Percentile not available for age over 80  2. Total plaque volume 545mm3 which is 61st percentile for age- and sex-matched controls (calcified plaque 180mm3; non-calcified plaque 440mm3). TPV is severe  3. Normal coronary origin with right dominance.  4. Severe (70-99%) stenosis in proximal to mid RCA. There are high risk plaque features including positive remodeling and low attenuation plaque  5. Moderate (50-69%) stenosis in mid LAD and proximal LCX  6. Dilated main pulmonary artery measuring 30mm  7. Will send study for CTFFR  RECOMMENDATIONS: CAD-RADS 4: Severe stenosis. (70-99% or > 50% left main). Cardiac catheterization or CT FFR is recommended. Consider symptom-guided anti-ischemic pharmacotherapy as well as risk factor modification per guideline directed care. Invasive coronary  angiography recommended with revascularization per published guideline statements.  Electronically Signed: By: Carson Clara M.D. On: 08/20/2023 22:25     ______________________________________________________________________________________________      Risk Assessment/Calculations:         Physical Exam:    VS:  BP (!) 180/92 (BP Location: Left Arm)   Pulse 94   Ht 5' 2 (1.575 m)   Wt 199 lb 6.4 oz (90.4 kg)   SpO2 95%   BMI 36.47 kg/m    Wt Readings from Last 3 Encounters:  08/28/23 199 lb 6.4 oz (90.4 kg)  07/17/23 200 lb 6.4 oz (90.9 kg)  07/12/23 203 lb (92.1 kg)    Gen: no distress   Neck: No JVD Cardiac: No Rubs or Gallops, no murmur, RRR +2 radial pulses Respiratory: Clear to auscultation bilaterally, normal effort, normal  respiratory rate GI: Soft, nontender, non-distended  MS: No  edema;  moves all extremities Integument: Skin feels warm Neuro:  At time of evaluation, alert and oriented to person/place/time/situation  Psych: Normal affect, patient feels well   ASSESSMENT AND PLAN: .    Obstructive coronary artery disease Obstructive coronary artery disease with significant soft plaque in the right coronary artery (RCA) and borderline blockage in the left anterior descending artery (LAD). The condition requires evaluation for potential intervention to facilitate future surgery on an ovarian cyst and uterine polyp.  Risks of the procedure include bleeding, stroke, and need for open heart surgery. The procedure aims to assess the severity of blockages and determine the need for stenting, particularly in the RCA and the D1. - start asa 81 mg PO daily - Perform heart catheterization to assess the severity of blockages. - Evaluate the hemodynamic significance of the LAD blockage using FFR Risks and benefits of cardiac catheterization have been discussed with the patient.  These include bleeding, infection, kidney damage, stroke, heart attack, death.   The patient understands these risks and is willing to proceed.  Access recommendations: R radial Procedural considerations  she is pending July surgery for an ovarian cyst if no intervention  Weakness and tremble - may be anginal equivalent  Hypertension Elevated blood pressure today, likely related to the stress of the upcoming procedure. AMB BP is 150 /80; will not make changes at this time prior to surgery  Ovarian cyst and uterine polyp Cyst on the left ovary, previously the size of an orange, and a large uterine polyp. Surgery planned for July 7th to remove both ovaries and the polyp, contingent on cardiac evaluation.  Post cath f/u with Dr. Arthuro Billow team  Gloriann Larger, MD FASE [facility] Cardiologist [facility]  [facility]  [address] [place], [address] [place], [place] [address] [phone]  3:20 PM

## 2023-08-28 NOTE — Progress Notes (Signed)
 Cardiology Office Note:  .    Date:  08/28/2023  ID:  Brittany Mcclure, DOB 1940/12/05, MRN [id number] PCP: Mast, Man X, NP  [facility] Providers Cardiologist:  Kyra Phy, MD     CC: DOD visit- F/u CCTA   History of Present Illness: .    Brittany Mcclure is a 83 y.o. female with obstructive coronary artery disease who presents for evaluation prior to a potential heart catheterization.  She experiences shortness of breath, weakness, and trembling, particularly after activities such as swimming. No significant atrial enlargement or aortic stenosis symptoms. She has a history of obstructive coronary artery disease with preserved ejection fraction, mild mitral regurgitation, and mild left ventricular hypertrophy.  She has been informed of two blockages in her coronary arteries, one serious and one less so. Her blood pressure is usually elevated, but it was significantly higher today.  She is scheduled for surgery on July 7th to remove a cyst on her left ovary, which was previously the size of an orange, and a large polyp in her uterus. She is concerned that the coronary blockages might delay this surgery. She has a history of bilateral carpal tunnel syndrome.  Discussed the use of AI scribe software for clinical note transcription with the patient, who gave verbal consent to proceed.  Relevant histories: .  Social - Married - AT Patient ROS: As per HPI.   Studies Reviewed: .     Cardiac Studies & Procedures   ______________________________________________________________________________________________     ECHOCARDIOGRAM  ECHOCARDIOGRAM COMPLETE 08/17/2023  Narrative ECHOCARDIOGRAM REPORT    Patient Name:   Brittany Mcclure [facility] Date of Exam: 08/17/2023 Medical Rec #:  [id number]       Height:       61.5 in Accession #:    [id number]      Weight:       200.4 lb Date of Birth:  1940-04-24        BSA:          1.902 m Patient Age:    83 years        BP:           150/80  mmHg Patient Gender: F               HR:           82 bpm. Exam Location:  [address]  Procedure: 2D Echo, 3D Echo, Cardiac Doppler, Color Doppler and Strain Analysis (Both Spectral and Color Flow Doppler were utilized during procedure).  Indications:    R06.00 Dyspnea  History:        Patient has no prior history of Echocardiogram examinations. Signs/Symptoms:Chest Pain; Risk Factors:Hypertension, Diabetes and Dyslipidemia.  Sonographer:    Brigid Canada RDCS Referring Phys: [id number] ARUN K THUKKANI  IMPRESSIONS   1. Left ventricular ejection fraction, by estimation, is 55 to 60%. Left ventricular ejection fraction by 3D volume is 60 %. The left ventricle has normal function. The left ventricle has no regional wall motion abnormalities. There is mild left ventricular hypertrophy of the basal-septal segment. Left ventricular diastolic parameters are indeterminate. Elevated left atrial pressure. The E/e' is 16.5. The average left ventricular global longitudinal strain is -21.4 %. The global longitudinal strain is normal. 2. Right ventricular systolic function is normal. The right ventricular size is normal. There is normal pulmonary artery systolic pressure. 3. The mitral valve is normal in structure. Mild mitral valve regurgitation. No evidence of mitral stenosis. 4. The aortic valve  is tricuspid. Aortic valve regurgitation is trivial. No aortic stenosis is present. 5. The inferior vena cava is normal in size with greater than 50% respiratory variability, suggesting right atrial pressure of 3 mmHg.  Comparison(s): No prior Echocardiogram.  FINDINGS Left Ventricle: Left ventricular ejection fraction, by estimation, is 55 to 60%. Left ventricular ejection fraction by 3D volume is 60 %. The left ventricle has normal function. The left ventricle has no regional wall motion abnormalities. The average left ventricular global longitudinal strain is -21.4 %. Strain was performed  and the global longitudinal strain is normal. The left ventricular internal cavity size was normal in size. There is mild left ventricular hypertrophy of the basal-septal segment. Left ventricular diastolic parameters are indeterminate. Elevated left atrial pressure. The E/e' is 16.5.  Right Ventricle: The right ventricular size is normal. No increase in right ventricular wall thickness. Right ventricular systolic function is normal. There is normal pulmonary artery systolic pressure. The tricuspid regurgitant velocity is 2.59 m/s, and with an assumed right atrial pressure of 3 mmHg, the estimated right ventricular systolic pressure is 29.8 mmHg.  Left Atrium: Left atrial size was normal in size.  Right Atrium: Right atrial size was normal in size.  Pericardium: There is no evidence of pericardial effusion.  Mitral Valve: The mitral valve is normal in structure. Mild mitral valve regurgitation. No evidence of mitral valve stenosis.  Tricuspid Valve: The tricuspid valve is normal in structure. Tricuspid valve regurgitation is mild . No evidence of tricuspid stenosis.  Aortic Valve: The aortic valve is tricuspid. Aortic valve regurgitation is trivial. No aortic stenosis is present.  Pulmonic Valve: The pulmonic valve was not well visualized. Pulmonic valve regurgitation is not visualized. No evidence of pulmonic stenosis.  Aorta: The aortic root and ascending aorta are structurally normal, with no evidence of dilitation.  Venous: The inferior vena cava is normal in size with greater than 50% respiratory variability, suggesting right atrial pressure of 3 mmHg.  IAS/Shunts: The atrial septum is grossly normal.  Additional Comments: 3D was performed not requiring image post processing on an independent workstation and was normal.   LEFT VENTRICLE PLAX 2D LVIDd:         4.50 cm         Diastology LVIDs:         3.20 cm         LV e' medial:    6.42 cm/s LV PW:         1.10 cm         LV  E/e' medial:  17.3 LV IVS:        1.10 cm         LV e' lateral:   7.13 cm/s LVOT diam:     2.00 cm         LV E/e' lateral: 15.6 LV SV:         57 LV SV Index:   30              2D Longitudinal LVOT Area:     3.14 cm        Strain 2D Strain GLS   -21.8 % (A4C): 2D Strain GLS   -20.2 % (A3C): 2D Strain GLS   -22.3 % (A2C): 2D Strain GLS   -21.4 % Avg:  3D Volume EF LV 3D EF:    Left ventricul ar ejection fraction by 3D volume is 60 %.  3D Volume EF: 3D EF:  60 % LV EDV:       148 ml LV ESV:       59 ml LV SV:        89 ml  RIGHT VENTRICLE             IVC RV Basal diam:  3.60 cm     IVC diam: 1.60 cm RV S prime:     10.53 cm/s TAPSE (M-mode): 1.7 cm  LEFT ATRIUM             Index        RIGHT ATRIUM          Index LA diam:        4.40 cm 2.31 cm/m   RA Area:     9.78 cm LA Vol (A2C):   57.4 ml 30.18 ml/m  RA Volume:   20.70 ml 10.88 ml/m LA Vol (A4C):   55.0 ml 28.92 ml/m LA Biplane Vol: 57.3 ml 30.13 ml/m AORTIC VALVE LVOT Vmax:   84.30 cm/s LVOT Vmean:  56.367 cm/s LVOT VTI:    0.180 m  AORTA Ao Root diam: 3.30 cm Ao Asc diam:  3.50 cm  MITRAL VALVE                TRICUSPID VALVE MV Area (PHT): 3.78 cm     TR Peak grad:   26.8 mmHg MV Decel Time: 201 msec     TR Vmax:        259.00 cm/s MV E velocity: 111.00 cm/s MV A velocity: 90.40 cm/s   SHUNTS MV E/A ratio:  1.23         Systemic VTI:  0.18 m Systemic Diam: 2.00 cm  Sunit Tolia Electronically signed by Brittany Mcclure Signature Date/Time: 08/17/2023/5:38:05 PM    Final      CT SCANS  CT CORONARY MORPH W/CTA COR W/SCORE 08/18/2023  Addendum 08/23/2023  9:55 PM ADDENDUM REPORT: 08/23/2023 21:52  EXAM: OVER-READ INTERPRETATION  CT CHEST  The following report is an over-read performed by radiologist Dr. Cyndia Drape [facility] on 08/23/2023. This over-read does not include interpretation of cardiac or coronary anatomy or pathology. The coronary CTA interpretation  by the cardiologist is attached.  COMPARISON:  12/11/2006.  FINDINGS: Cardiovascular:  See findings discussed in the body of the report.  Mediastinum/Nodes: No suspicious adenopathy identified. Imaged mediastinal structures are unremarkable.  Lungs/Pleura: Imaged lungs are clear. No pleural effusion or pneumothorax.  Upper Abdomen: No acute abnormality.  Musculoskeletal: No chest wall abnormality. No acute osseous findings.  IMPRESSION: No acute extracardiac incidental findings.   Electronically Signed By: Brittany Eva M.D. On: 08/23/2023 21:52  Narrative CLINICAL DATA:  Chest pain  EXAM: Cardiac/Coronary CTA  TECHNIQUE: A non-contrast, gated CT scan was obtained with axial slices of 2.5 mm through the heart for calcium scoring. Calcium scoring was performed using the Agatston method. A 120 kV prospective, gated, contrast cardiac CT scan was obtained. Gantry rotation speed was 230 msec and collimation was 0.63 mm. Two sublingual nitroglycerin  tablets (0.8 mg) were given. The 3D data set was reconstructed with motion correction for the best systolic or diastolic phase. Images were analyzed on a dedicated workstation using MPR, MIP, and VRT modes. The patient received 95 cc of contrast.  FINDINGS: Image quality: Good  Noise artifact is: Limited.  Coronary Arteries:  Normal coronary origin.  Right dominance.  Left main: The left main is a large caliber vessel with a normal take off from the left  coronary cusp that bifurcates into a LAD and LCX. Calcified plaque in the left main causes 0-24% stenosis  Left anterior descending artery: The LAD is patent. The LAD gives off 2 patent diagonal branches. Calcified plaque in the proximal LAD causes 0-24% stenosis. Mixed plaque in mid LAD causes 50-69% stenosis  Left circumflex artery: The LCX is non-dominant and patent. Mixed plaque in the proximal LCX causes 50-69% stenosis  Right coronary artery: The RCA is  dominant with normal take off from the right coronary cusp. The RCA terminates as a PDA and right posterolateral branch. Mixed plaque in the proximal to mid RCA causes 70-99% stenosis; high risk plaque features including positive remodeling and low attenuation plaque  Right Atrium: Right atrial size is within normal limits.  Right Ventricle: The right ventricular cavity is within normal limits.  Left Atrium: Left atrial size is normal in size with no left atrial appendage filling defect.  Left Ventricle: The ventricular cavity size is within normal limits.  Pulmonary arteries: Dilated main pulmonary artery measuring 30mm  Pulmonary veins: Normal pulmonary venous drainage.  Pericardium: Normal thickness without significant effusion or calcium present.  Cardiac valves: The aortic valve is trileaflet without significant calcification. The mitral valve is normal without significant calcification.  Aorta: Normal caliber without significant disease.  Extra-cardiac findings: See attached radiology report for non-cardiac structures.  IMPRESSION: 1. Coronary calcium score of 395. Percentile not available for age over 80  2. Total plaque volume 545mm3 which is 61st percentile for age- and sex-matched controls (calcified plaque 180mm3; non-calcified plaque 440mm3). TPV is severe  3. Normal coronary origin with right dominance.  4. Severe (70-99%) stenosis in proximal to mid RCA. There are high risk plaque features including positive remodeling and low attenuation plaque  5. Moderate (50-69%) stenosis in mid LAD and proximal LCX  6. Dilated main pulmonary artery measuring 30mm  7. Will send study for CTFFR  RECOMMENDATIONS: CAD-RADS 4: Severe stenosis. (70-99% or > 50% left main). Cardiac catheterization or CT FFR is recommended. Consider symptom-guided anti-ischemic pharmacotherapy as well as risk factor modification per guideline directed care. Invasive coronary  angiography recommended with revascularization per published guideline statements.  Electronically Signed: By: Brittany Clara M.D. On: 08/20/2023 22:25     ______________________________________________________________________________________________      Risk Assessment/Calculations:         Physical Exam:    VS:  BP (!) 180/92 (BP Location: Left Arm)   Pulse 94   Ht 5' 2 (1.575 m)   Wt 199 lb 6.4 oz (90.4 kg)   SpO2 95%   BMI 36.47 kg/m    Wt Readings from Last 3 Encounters:  08/28/23 199 lb 6.4 oz (90.4 kg)  07/17/23 200 lb 6.4 oz (90.9 kg)  07/12/23 203 lb (92.1 kg)    Gen: no distress   Neck: No JVD Cardiac: No Rubs or Gallops, no murmur, RRR +2 radial pulses Respiratory: Clear to auscultation bilaterally, normal effort, normal  respiratory rate GI: Soft, nontender, non-distended  MS: No  edema;  moves all extremities Integument: Skin feels warm Neuro:  At time of evaluation, alert and oriented to person/place/time/situation  Psych: Normal affect, patient feels well   ASSESSMENT AND PLAN: .    Obstructive coronary artery disease Obstructive coronary artery disease with significant soft plaque in the right coronary artery (RCA) and borderline blockage in the left anterior descending artery (LAD). The condition requires evaluation for potential intervention to facilitate future surgery on an ovarian cyst and uterine polyp.  Risks of the procedure include bleeding, stroke, and need for open heart surgery. The procedure aims to assess the severity of blockages and determine the need for stenting, particularly in the RCA and the D1. - start asa 81 mg PO daily - Perform heart catheterization to assess the severity of blockages. - Evaluate the hemodynamic significance of the LAD blockage using FFR Risks and benefits of cardiac catheterization have been discussed with the patient.  These include bleeding, infection, kidney damage, stroke, heart attack, death.   The patient understands these risks and is willing to proceed.  Access recommendations: R radial Procedural considerations  she is pending July surgery for an ovarian cyst if no intervention  Weakness and tremble - may be anginal equivalent  Hypertension Elevated blood pressure today, likely related to the stress of the upcoming procedure. AMB BP is 150 /80; will not make changes at this time prior to surgery  Ovarian cyst and uterine polyp Cyst on the left ovary, previously the size of an orange, and a large uterine polyp. Surgery planned for July 7th to remove both ovaries and the polyp, contingent on cardiac evaluation.  Post cath f/u with Dr. Arthuro Billow team  Brittany Larger, MD FASE [facility] Cardiologist [facility]  [facility]  [address] [place], [address] [place], [place] [address] [phone]  3:20 PM

## 2023-08-29 ENCOUNTER — Telehealth: Payer: Self-pay | Admitting: *Deleted

## 2023-08-29 ENCOUNTER — Ambulatory Visit: Payer: Self-pay

## 2023-08-29 LAB — BASIC METABOLIC PANEL WITH GFR
BUN/Creatinine Ratio: 23 (ref 12–28)
BUN: 29 mg/dL — ABNORMAL HIGH (ref 8–27)
CO2: 21 mmol/L (ref 20–29)
Calcium: 9.8 mg/dL (ref 8.7–10.3)
Chloride: 101 mmol/L (ref 96–106)
Creatinine, Ser: 1.28 mg/dL — ABNORMAL HIGH (ref 0.57–1.00)
Glucose: 96 mg/dL (ref 70–99)
Potassium: 5.4 mmol/L — ABNORMAL HIGH (ref 3.5–5.2)
Sodium: 140 mmol/L (ref 134–144)
eGFR: 41 mL/min/{1.73_m2} — ABNORMAL LOW (ref >59)

## 2023-08-29 LAB — CBC
Hematocrit: 38.7 % (ref 34.0–46.6)
Hemoglobin: 12.6 g/dL (ref 11.1–15.9)
MCH: 30.5 pg (ref 26.6–33.0)
MCHC: 32.6 g/dL (ref 31.5–35.7)
MCV: 94 fL (ref 79–97)
Platelets: 296 10*3/uL (ref 150–450)
RBC: 4.13 x10E6/uL (ref 3.77–5.28)
RDW: 12.8 % (ref 11.7–15.4)
WBC: 8.9 10*3/uL (ref 3.4–10.8)

## 2023-08-29 NOTE — Telephone Encounter (Addendum)
 Cardiac Catheterization scheduled at [facility] for: Thursday August 31, 2023 10:30 AM Arrival time [facility] Main Entrance A at: 5:30 AM-pre-procedure hydration  Nothing to eat after midnight prior to procedure, clear liquids until 5 AM day of procedure.  Medication instructions: -Hold:  Micardis -day before and day of procedure-per protocol GFR < 60 (41)  Glipizide -AM of procedure -Other usual morning medications can be taken with sips of water including aspirin 81 mg and Plavix  75 mg.  Plan to go home the same day, you will only stay overnight if medically necessary.  You must have responsible adult to drive you home.  Someone must be with you the first 24 hours after you arrive home.  Reviewed procedure instructions/pre-procedure hydration with patient and patient's daughter (DPR).

## 2023-08-29 NOTE — Telephone Encounter (Signed)
 Reviewed procedure instructions/pre-procedure hydration with patient and her daughter (DPR), Genevia Kern. Patient tells me iodine allergy is skin reaction from topical iodine she used several years ago.

## 2023-08-29 NOTE — Telephone Encounter (Signed)
 Call to patient, no answer, voicemail message.

## 2023-08-30 ENCOUNTER — Telehealth: Payer: Self-pay

## 2023-08-30 NOTE — Telephone Encounter (Signed)
 Patient left a voicemail stating she's having a heart procedure on tomorrow (08/31/23) and her provider is recommending she cancel the surgery w/ Dr. Annabell Key on 09/25/23.

## 2023-08-31 ENCOUNTER — Ambulatory Visit (HOSPITAL_COMMUNITY)
Admission: RE | Admit: 2023-08-31 | Discharge: 2023-08-31 | Disposition: A | Discharge status: Home / Self Care | Attending: Internal Medicine | Admitting: Internal Medicine

## 2023-08-31 ENCOUNTER — Other Ambulatory Visit: Payer: Self-pay

## 2023-08-31 ENCOUNTER — Encounter (HOSPITAL_COMMUNITY)
Admission: RE | Disposition: A | Discharge status: Home / Self Care | Payer: Self-pay | Source: Home / Self Care | Attending: Internal Medicine

## 2023-08-31 ENCOUNTER — Ambulatory Visit: Payer: Self-pay | Admitting: Internal Medicine

## 2023-08-31 DIAGNOSIS — I129 Hypertensive chronic kidney disease with stage 1 through stage 4 chronic kidney disease, or unspecified chronic kidney disease: Secondary | ICD-10-CM | POA: Diagnosis not present

## 2023-08-31 DIAGNOSIS — Z79899 Other long term (current) drug therapy: Secondary | ICD-10-CM | POA: Insufficient documentation

## 2023-08-31 DIAGNOSIS — E1122 Type 2 diabetes mellitus with diabetic chronic kidney disease: Secondary | ICD-10-CM | POA: Diagnosis not present

## 2023-08-31 DIAGNOSIS — Z7982 Long term (current) use of aspirin: Secondary | ICD-10-CM | POA: Insufficient documentation

## 2023-08-31 DIAGNOSIS — N83209 Unspecified ovarian cyst, unspecified side: Secondary | ICD-10-CM | POA: Insufficient documentation

## 2023-08-31 DIAGNOSIS — N840 Polyp of corpus uteri: Secondary | ICD-10-CM | POA: Diagnosis not present

## 2023-08-31 DIAGNOSIS — E1169 Type 2 diabetes mellitus with other specified complication: Secondary | ICD-10-CM

## 2023-08-31 DIAGNOSIS — N189 Chronic kidney disease, unspecified: Secondary | ICD-10-CM | POA: Diagnosis not present

## 2023-08-31 DIAGNOSIS — E785 Hyperlipidemia, unspecified: Secondary | ICD-10-CM | POA: Insufficient documentation

## 2023-08-31 DIAGNOSIS — I340 Nonrheumatic mitral (valve) insufficiency: Secondary | ICD-10-CM | POA: Diagnosis not present

## 2023-08-31 DIAGNOSIS — I2510 Atherosclerotic heart disease of native coronary artery without angina pectoris: Secondary | ICD-10-CM | POA: Diagnosis not present

## 2023-08-31 DIAGNOSIS — R931 Abnormal findings on diagnostic imaging of heart and coronary circulation: Secondary | ICD-10-CM | POA: Diagnosis present

## 2023-08-31 DIAGNOSIS — I700 Atherosclerosis of aorta: Secondary | ICD-10-CM

## 2023-08-31 HISTORY — PX: LEFT HEART CATH AND CORONARY ANGIOGRAPHY: CATH118249

## 2023-08-31 HISTORY — PX: CORONARY PRESSURE/FFR STUDY: CATH118243

## 2023-08-31 LAB — POCT ACTIVATED CLOTTING TIME: Activated Clotting Time: 285 s

## 2023-08-31 LAB — BASIC METABOLIC PANEL WITH GFR
Anion gap: 10 (ref 5–15)
BUN: 28 mg/dL — ABNORMAL HIGH (ref 8–23)
CO2: 23 mmol/L (ref 22–32)
Calcium: 9.2 mg/dL (ref 8.9–10.3)
Chloride: 106 mmol/L (ref 98–111)
Creatinine, Ser: 1.14 mg/dL — ABNORMAL HIGH (ref 0.44–1.00)
GFR, Estimated: 47 mL/min — ABNORMAL LOW (ref >60)
Glucose, Bld: 90 mg/dL (ref 70–99)
Potassium: 4.4 mmol/L (ref 3.5–5.1)
Sodium: 139 mmol/L (ref 135–145)

## 2023-08-31 LAB — GLUCOSE, CAPILLARY: Glucose-Capillary: 98 mg/dL (ref 70–99)

## 2023-08-31 SURGERY — LEFT HEART CATH AND CORONARY ANGIOGRAPHY
Anesthesia: LOCAL

## 2023-08-31 MED ORDER — HEPARIN SODIUM (PORCINE) 1000 UNIT/ML IJ SOLN
INTRAMUSCULAR | Status: AC
  Filled 2023-08-31: qty 10

## 2023-08-31 MED ORDER — HYDRALAZINE HCL 20 MG/ML IJ SOLN: 10.0000 mg | INTRAMUSCULAR | Status: DC | PRN

## 2023-08-31 MED ORDER — HEPARIN (PORCINE) IN NACL 1000-0.9 UT/500ML-% IV SOLN
INTRAVENOUS | Status: DC | PRN
  Administered 2023-08-31: 1000 mL via SURGICAL_CAVITY

## 2023-08-31 MED ORDER — ACETAMINOPHEN 325 MG PO TABS: 650.0000 mg | ORAL_TABLET | ORAL | Status: DC | PRN

## 2023-08-31 MED ORDER — SODIUM CHLORIDE 0.9 % IV SOLN: INTRAVENOUS | Status: DC

## 2023-08-31 MED ORDER — SODIUM CHLORIDE 0.9 % WEIGHT BASED INFUSION
3.0000 mL/kg/h | INTRAVENOUS | Status: AC
  Administered 2023-08-31: 3 mL/kg/h via INTRAVENOUS

## 2023-08-31 MED ORDER — NITROGLYCERIN 1 MG/10 ML FOR IR/CATH LAB
INTRA_ARTERIAL | Status: DC | PRN
  Administered 2023-08-31: 200 ug via INTRACORONARY

## 2023-08-31 MED ORDER — IOHEXOL 350 MG/ML SOLN
INTRAVENOUS | Status: DC | PRN
  Administered 2023-08-31: 50 mL

## 2023-08-31 MED ORDER — SODIUM CHLORIDE 0.9 % IV SOLN: 250.0000 mL | INTRAVENOUS | Status: DC | PRN

## 2023-08-31 MED ORDER — ONDANSETRON HCL 4 MG/2ML IJ SOLN
4.0000 mg | Freq: Four times a day (QID) | INTRAMUSCULAR | Status: DC | PRN
Start: 2023-08-31 — End: 2023-08-31

## 2023-08-31 MED ORDER — MIDAZOLAM HCL 2 MG/2ML IJ SOLN
INTRAMUSCULAR | Status: DC | PRN
  Administered 2023-08-31: 1 mg via INTRAVENOUS

## 2023-08-31 MED ORDER — LIDOCAINE HCL (PF) 1 % IJ SOLN
INTRAMUSCULAR | Status: DC | PRN
  Administered 2023-08-31: 2 mL

## 2023-08-31 MED ORDER — NITROGLYCERIN 1 MG/10 ML FOR IR/CATH LAB
INTRA_ARTERIAL | Status: AC
  Filled 2023-08-31: qty 10

## 2023-08-31 MED ORDER — FENTANYL CITRATE (PF) 100 MCG/2ML IJ SOLN
INTRAMUSCULAR | Status: DC | PRN
  Administered 2023-08-31: 25 ug via INTRAVENOUS

## 2023-08-31 MED ORDER — ASPIRIN 81 MG PO CHEW
81.0000 mg | CHEWABLE_TABLET | ORAL | Status: DC
Start: 2023-08-31 — End: 2023-08-31

## 2023-08-31 MED ORDER — SODIUM CHLORIDE 0.9% FLUSH: 3.0000 mL | Freq: Two times a day (BID) | INTRAVENOUS | Status: DC

## 2023-08-31 MED ORDER — MIDAZOLAM HCL 2 MG/2ML IJ SOLN
INTRAMUSCULAR | Status: AC
Start: 2023-08-31 — End: 2023-08-31
  Filled 2023-08-31: qty 2

## 2023-08-31 MED ORDER — HEPARIN SODIUM (PORCINE) 1000 UNIT/ML IJ SOLN
INTRAMUSCULAR | Status: DC | PRN
  Administered 2023-08-31 (×2): 4500 [IU] via INTRAVENOUS

## 2023-08-31 MED ORDER — LIDOCAINE HCL (PF) 1 % IJ SOLN
INTRAMUSCULAR | Status: AC
  Filled 2023-08-31: qty 30

## 2023-08-31 MED ORDER — VERAPAMIL HCL 2.5 MG/ML IV SOLN
INTRAVENOUS | Status: AC
  Filled 2023-08-31: qty 2

## 2023-08-31 MED ORDER — AMLODIPINE BESYLATE 2.5 MG PO TABS: 2.5000 mg | ORAL_TABLET | Freq: Every day | ORAL | 5 refills | Status: DC

## 2023-08-31 MED ORDER — LABETALOL HCL 5 MG/ML IV SOLN: 10.0000 mg | INTRAVENOUS | Status: DC | PRN

## 2023-08-31 MED ORDER — CLOPIDOGREL BISULFATE 75 MG PO TABS
75.0000 mg | ORAL_TABLET | ORAL | Status: DC
Start: 2023-08-31 — End: 2023-08-31

## 2023-08-31 MED ORDER — SODIUM CHLORIDE 0.9 % WEIGHT BASED INFUSION
1.0000 mL/kg/h | INTRAVENOUS | Status: DC
  Administered 2023-08-31: 1 mL/kg/h via INTRAVENOUS

## 2023-08-31 MED ORDER — SODIUM CHLORIDE 0.9% FLUSH: 3.0000 mL | INTRAVENOUS | Status: DC | PRN

## 2023-08-31 MED ORDER — FENTANYL CITRATE (PF) 100 MCG/2ML IJ SOLN
INTRAMUSCULAR | Status: AC
Start: 2023-08-31 — End: 2023-08-31
  Filled 2023-08-31: qty 2

## 2023-08-31 SURGICAL SUPPLY — 9 items
CATH 5FR JL3.5 JR4 ANG PIG MP (CATHETERS) IMPLANT
CATH VISTA GUIDE 6FR JR4 ECOPK (CATHETERS) IMPLANT
DEVICE RAD COMP TR BAND LRG (VASCULAR PRODUCTS) IMPLANT
GLIDESHEATH SLEND SS 6F .021 (SHEATH) IMPLANT
GUIDEWIRE INQWIRE 1.5J.035X260 (WIRE) IMPLANT
GUIDEWIRE PRESSURE X 175 (WIRE) IMPLANT
KIT ESSENTIALS PG (KITS) IMPLANT
PACK CARDIAC CATHETERIZATION (CUSTOM PROCEDURE TRAY) ×1 IMPLANT
SET ATX-X65L (MISCELLANEOUS) IMPLANT

## 2023-08-31 NOTE — Brief Op Note (Signed)
 BRIEF CARDIAC CATHETERIZATION NOTE  08/31/2023  1:58 PM  PATIENT:  Georgina Kitchen  84 y.o. female  PRE-OPERATIVE DIAGNOSIS:  Abnormal CTA  POST-OPERATIVE DIAGNOSIS:  Nonobstructive CAD  PROCEDURE:  Procedure(s): LEFT HEART CATH AND CORONARY ANGIOGRAPHY (N/A) CORONARY PRESSURE/FFR STUDY (N/A)  SURGEON:  Surgeons and Role:    * Kyo Cocuzza, MD - Primary  PHYSICIAN ASSISTANT:   FINDINGS: Moderate to severe but not critical single-vessel CAD with 70-80% stenosis if mid RCA that is not hemodynamically significant (RFR = 0.92).  Mild, non-obstructive disease noted in proximal to mid LAD with ~30% stenosis.  LCx without significant disease. Normal left ventricular filling pressure (LVEDP ~10 mmHg).  RECOMMENDATIONS: Initiate antianginal therapy; will add amlodipine 2.5 mg daily. Consider adding PCSK9 inhibitor to prevent progression of CAD, given statin intolerance. Consider holding clopidogrel  given that PCI was not performed and patient is scheduled for gynecologic surgery next month; will defer to primary cardiology team.  Sammy Crisp, MD [facility]

## 2023-08-31 NOTE — Progress Notes (Signed)
 On arrival from cath lab, swelling noted right radial and pressure held x and site softer after pressure held

## 2023-08-31 NOTE — Interval H&P Note (Signed)
 History and Physical Interval Note:  08/31/2023 1:48 PM  Brittany Mcclure  has presented today for surgery, with the diagnosis of Abnormal CTA.  The various methods of treatment have been discussed with the patient and family. After consideration of risks, benefits and other options for treatment, the patient has consented to  Procedure(s): LEFT HEART CATH AND CORONARY ANGIOGRAPHY (N/A) CORONARY PRESSURE/FFR STUDY (N/A) as a surgical intervention.  The patient's history has been reviewed, patient examined, no change in status, stable for surgery.  I have reviewed the patient's chart and labs.  Questions were answered to the patient's satisfaction.    Cath Lab Visit (complete for each Cath Lab visit)  Clinical Evaluation Leading to the Procedure:   ACS: No.  Non-ACS:    Anginal Classification: CCS II(fatigue and random transient chest pain)  Anti-ischemic medical therapy: No Therapy  Non-Invasive Test Results: Coronary CTA with significant RCA and borderline LCx disease.  Prior CABG: No previous CABG        [name]

## 2023-09-01 ENCOUNTER — Encounter (HOSPITAL_COMMUNITY): Payer: Self-pay | Admitting: Internal Medicine

## 2023-09-01 ENCOUNTER — Telehealth (HOSPITAL_BASED_OUTPATIENT_CLINIC_OR_DEPARTMENT_OTHER): Payer: Self-pay | Admitting: *Deleted

## 2023-09-01 MED FILL — Verapamil HCl IV Soln 2.5 MG/ML: INTRAVENOUS | Qty: 2 | Status: AC

## 2023-09-01 NOTE — Telephone Encounter (Signed)
-----   Message from Lillian Rein sent at 08/30/2023 12:42 PM EDT ----- Regarding: FW: ##Surgery CANCELED 09/25/23## Burdette Carolin, Can you call this pt and let her know we will cancel her surgery.  I'd like to either plan a follow up u/s in 6 months after the last one was done or can she just call us  after the procedure and give an update?  Thanks.  Ottie Blonder ----- Message ----- From: Jonne Netters Sent: 08/30/2023  10:35 AM EDT To: Marliss Simple, RN; Lillian Rein, MD; # Subject: FW: ##Surgery CANCELED 09/25/23##                Patient left a voicemail stating she's having a heart procedure on tomorrow (08/31/23) and her provider is recommending she cancel the surgery w/ Dr. Annabell Key on 09/25/23. ----- Message ----- From: Jonne Netters Sent: 08/03/2023   9:44 AM EDT To: Lillian Rein, MD; Corky Diener; # Subject: ##Surgery 09/25/23##                             Done Posted for 09/25/23 @[facility]  Main w/ Dr. Annabell Key & Dr. Elester Grim at 1100. [id number] & [id number], [id number], [id number]

## 2023-09-01 NOTE — Telephone Encounter (Signed)
 Called pt to discuss setting up an appt for follow up since surgery was cancelled. Pt reports that she did not have a stent put in. She reports that she has 70% blockage but good flow, so the provider did not feel that a stent was the best option. She is ready to proceed with rescheduling her surgery. Advised that I would let provider know and we would reach back out to her.

## 2023-09-01 NOTE — Telephone Encounter (Signed)
 Pt aware that surgery scheduler will reach out to reschedule

## 2023-09-02 ENCOUNTER — Other Ambulatory Visit (HOSPITAL_BASED_OUTPATIENT_CLINIC_OR_DEPARTMENT_OTHER): Payer: Self-pay | Admitting: Obstetrics & Gynecology

## 2023-09-02 ENCOUNTER — Encounter (HOSPITAL_BASED_OUTPATIENT_CLINIC_OR_DEPARTMENT_OTHER): Payer: Self-pay | Admitting: Obstetrics & Gynecology

## 2023-09-02 DIAGNOSIS — Z01818 Encounter for other preprocedural examination: Secondary | ICD-10-CM

## 2023-09-04 ENCOUNTER — Telehealth (HOSPITAL_BASED_OUTPATIENT_CLINIC_OR_DEPARTMENT_OTHER): Payer: Self-pay

## 2023-09-04 NOTE — Telephone Encounter (Signed)
-----   Message from Lillian Rein sent at 09/02/2023  8:09 AM EDT ----- Regarding: surgery 8/18 This pt is scheduled for surgery 8/18.  She needs a pre op with me 2-3 weeks prior.  Please schedule.  She needs to stop her Plavix  5 days before surgery per her cardiologist.  Lastly, can you ask when her last hba1c was drawn?  I see one from December and it was 7.2.  I'd like to know if it's higher.  Thank you.  Dr. Annabell Key

## 2023-09-04 NOTE — Telephone Encounter (Signed)
 Called patient. DOB verified. Patient was informed of her surgery date 8/18. She is also awaiting her call from scheduling for her 2-3 week pre op appointment. Patient was advised to stop Plavix  5 days prior to surgery per her cardiologist. Patient thought she had A1C drawn recently. Orders can be seen in the chart but no results. Looks like the orders are being put in by the facility in which patient resides but no results. I have left a message for Ammon Bales the charge nurse to call our office so that we can find out how we can get those labs drawn and resulted. tbw

## 2023-09-05 ENCOUNTER — Other Ambulatory Visit (HOSPITAL_BASED_OUTPATIENT_CLINIC_OR_DEPARTMENT_OTHER): Payer: Self-pay | Admitting: Obstetrics & Gynecology

## 2023-09-06 NOTE — Telephone Encounter (Signed)
 Called and [facility] for Brittany Mcclure (Nurse) [phone] to give our office a call. We will need to obtain a current A1C. tbw

## 2023-09-11 NOTE — Telephone Encounter (Signed)
 LMOM at 12:25 for Brittany Mcclure to give our office a call. tbw

## 2023-09-12 NOTE — Telephone Encounter (Signed)
 Called and left a detailed message on the voicemail that we are needing a recent Hgb A1c. If there is no recent Hgb A1c, then we would like to know how to go about getting one done. tbw

## 2023-09-18 ENCOUNTER — Other Ambulatory Visit (HOSPITAL_BASED_OUTPATIENT_CLINIC_OR_DEPARTMENT_OTHER): Payer: Self-pay

## 2023-09-18 ENCOUNTER — Ambulatory Visit: Admitting: Gastroenterology

## 2023-09-18 DIAGNOSIS — Z01818 Encounter for other preprocedural examination: Secondary | ICD-10-CM

## 2023-09-23 ENCOUNTER — Other Ambulatory Visit: Payer: Self-pay | Admitting: Nurse Practitioner

## 2023-09-25 ENCOUNTER — Ambulatory Visit (HOSPITAL_COMMUNITY): Admit: 2023-09-25 | Admitting: Obstetrics & Gynecology

## 2023-09-25 SURGERY — SALPINGO-OOPHORECTOMY, UNILATERAL, LAPAROSCOPIC
Anesthesia: Choice

## 2023-09-25 NOTE — Telephone Encounter (Signed)
 Patient has request for refill on Xanax . Patient last refill was 05/12/2023. Patient has NO contract on file. Patient has upcoming appointment 11/13/2023. Updated contract/Sign contract was added to appointment notes. Medication pend and sent to PCP (Mast, Man X, NP) for approval.

## 2023-09-27 NOTE — Telephone Encounter (Signed)
 Called and spoke with Verneita to let her know that we are faxing over order for Hgb A1c. Faxed over to [phone]. Myles Verneita to give me a call back if she does not receive the order. She informed me that blood is drawn every Thursday in the facility. tbw

## 2023-09-28 DIAGNOSIS — E1169 Type 2 diabetes mellitus with other specified complication: Secondary | ICD-10-CM | POA: Diagnosis not present

## 2023-09-28 DIAGNOSIS — E669 Obesity, unspecified: Secondary | ICD-10-CM | POA: Diagnosis not present

## 2023-09-28 DIAGNOSIS — R300 Dysuria: Secondary | ICD-10-CM | POA: Diagnosis not present

## 2023-09-30 LAB — CULTURE, URINE COMPREHENSIVE
MICRO NUMBER:: 16682104
SPECIMEN QUALITY:: ADEQUATE

## 2023-09-30 LAB — HEMOGLOBIN A1C
Hgb A1c MFr Bld: 6.6 % — ABNORMAL HIGH (ref <5.7)
Mean Plasma Glucose: 143 mg/dL
eAG (mmol/L): 7.9 mmol/L

## 2023-09-30 LAB — MICROALBUMIN / CREATININE URINE RATIO
Creatinine, Urine: 107 mg/dL (ref 20–275)
Microalb Creat Ratio: 108 mg/g{creat} — ABNORMAL HIGH (ref <30)
Microalb, Ur: 11.6 mg/dL

## 2023-09-30 LAB — EXTRA URINE SPECIMEN

## 2023-10-02 ENCOUNTER — Other Ambulatory Visit: Payer: Self-pay | Admitting: Nurse Practitioner

## 2023-10-02 ENCOUNTER — Ambulatory Visit: Payer: Self-pay | Admitting: Nurse Practitioner

## 2023-10-02 DIAGNOSIS — N390 Urinary tract infection, site not specified: Secondary | ICD-10-CM

## 2023-10-02 MED ORDER — AMOXICILLIN-POT CLAVULANATE 875-125 MG PO TABS: 1.0000 | ORAL_TABLET | Freq: Two times a day (BID) | ORAL | 0 refills | Status: AC

## 2023-10-04 NOTE — Progress Notes (Signed)
 Cardiology Office Note:    Date:  10/11/2023   ID:  Brittany Mcclure, DOB 18-Mar-1941, MRN [id number]  PCP:  Mast, Man X, NP   [facility] Providers Cardiologist:  Brittany MARLA Red, MD Cardiology APP:  Brittany Jon Garre, PA     Referring MD: Mast, Man X, NP   Chief Complaint  Patient presents with   Follow-up    Post cath    History of Present Illness:    Brittany Mcclure is a 83 y.o. female with a hx of nonobstructive CAD, HLD with statin intolerance, DM2, HTN, HLD, aortic atherosclerosis, CKD 3a, and obesity.   Chest pain lead to abnormal CTA coronary which prompted definitive angiography.  LHC on 08/31/23 which revealed 75% stenosis in the mid RCA that was not hemodynamically significant (RFR 0.92) - no PCI. she was started on plavix , but this can be held for procedures.   She was advised to proceed with gynecologic procedure.   She presents for follow up after heart cath. She feels well, remains active. Describes leg pain after a lot of walking, could be claudication. Cath site without hematoma. No bleeding with ASA and plavix .    Past Medical History:  Diagnosis Date   Allergy    Anxiety    Arthritis    with gout   Carotid bruit    right   Cataract    bil cataracts removed   Chest pain    Chronic kidney disease    Colon polyps    Depression    Diverticulosis    Duodenitis    Exogenous obesity    Gastric ulcer    GERD (gastroesophageal reflux disease)    Hyperlipidemia    Hypertension    Metabolic syndrome    Osteopenia    Seasonal allergies    seasonal   Tubular adenoma of colon    Type 2 diabetes mellitus (HCC)     Past Surgical History:  Procedure Laterality Date   COLONOSCOPY     11/90,11/91,1994,2000,2010   CORONARY PRESSURE/FFR STUDY N/A 08/31/2023   Procedure: CORONARY PRESSURE/FFR STUDY;  Surgeon: Brittany Bruckner, MD;  Location: [facility];  Service: Cardiovascular;  Laterality: N/A;   DILATION AND CURETTAGE OF UTERUS      DILATION AND CURETTAGE OF UTERUS  1997   FOOT SURGERY     Mcclure 2   HAND SURGERY Right    Carpel Tunnel   LEFT HEART CATH AND CORONARY ANGIOGRAPHY N/A 08/31/2023   Procedure: LEFT HEART CATH AND CORONARY ANGIOGRAPHY;  Surgeon: Brittany Bruckner, MD;  Location: [facility];  Service: Cardiovascular;  Laterality: N/A;   POLYPECTOMY     TUBAL LIGATION      Current Medications: Current Meds  Medication Sig   Accu-Chek FastClix Lancets MISC USE AS DIRECTED.   acetaminophen  (TYLENOL ) 500 MG tablet Take 500 mg by mouth every morning.   acetaminophen  (TYLENOL ) 650 MG CR tablet Take 650 mg by mouth at bedtime.   ALPRAZolam  (XANAX ) 0.25 MG tablet TAKE ONE TABLET BY MOUTH TWICE DAILY AS NEEDED   amLODipine  (NORVASC ) 2.5 MG tablet Take 1 tablet (2.5 mg total) by mouth daily.   amoxicillin -clavulanate (AUGMENTIN ) 875-125 MG tablet Take 1 tablet by mouth 2 (two) times daily for 5 days.   aspirin  EC 81 MG tablet Take 1 tablet (81 mg total) by mouth daily. Swallow whole.   CALCIUM PO Take 600 mg by mouth daily.   Cholecalciferol (VITAMIN D3) 125 MCG (5000 UT) TABS  Take 5,000 Units by mouth daily.   clopidogrel  (PLAVIX ) 75 MG tablet Take 1 tablet (75 mg total) by mouth daily.   cyanocobalamin  (VITAMIN B12) 1000 MCG tablet Take 1 tablet (1,000 mcg total) by mouth 2 (two) times a week.   esomeprazole  (NEXIUM ) 40 MG capsule Take 1 capsule (40 mg total) by mouth daily.   FLUoxetine  (PROZAC ) 40 MG capsule TAKE ONE CAPSULE BY MOUTH EVERY DAY.   glipiZIDE  (GLUCOTROL ) 5 MG tablet Take 1 tablet (5 mg total) by mouth 2 (two) times daily before a meal.   glucose blood (ACCU-CHEK SMARTVIEW) test strip CHECK BLOOD SUGAR 3 TIMES DAILY AS DIRECTED.   Glycerin-Hypromellose-PEG 400 (DRY EYE RELIEF DROPS OP) Place 1 drop into both eyes daily as needed (Dry eye).   Multiple Vitamins-Minerals (PRESERVISION AREDS PO) Take 1 tablet by mouth in the morning and at bedtime.   Omega-3 Fatty Acids (FISH OIL) 1000 MG CAPS Take  1,000 mg by mouth daily.   telmisartan  (MICARDIS ) 40 MG tablet Take 1 tablet (40 mg total) by mouth daily.   triamcinolone  cream (KENALOG ) 0.1 % Apply 1 Application topically 2 (two) times daily. Apply 2 times daily to the back for up to 2 weeks then STOP, Resume in 2 weeks if irritation is not resolved (Patient taking differently: Apply 1 Application topically as needed (skin irritation). Apply 2 times daily to the back for up to 2 weeks then STOP, Resume in 2 weeks if irritation is not resolved)     Allergies:   Crestor [rosuvastatin calcium], Elemental sulfur, Gemfibrozil , Iodine, Lipitor [atorvastatin calcium], Lovastatin, Sulfonamide derivatives, and Zetia  [ezetimibe ]   Social History   Socioeconomic History   Marital status: Widowed    Spouse name: Not on file   Number of children: 2   Years of education: Not on file   Highest education level: Not on file  Occupational History   Occupation: retired    Associate Professor: RETIRED  Tobacco Use   Smoking status: Former    Passive exposure: Never   Smokeless tobacco: Never   Tobacco comments:    Late 20's or early 30's  Vaping Use   Vaping status: Never Used  Substance and Sexual Activity   Alcohol use: Yes    Alcohol/week: 0.0 standard drinks of alcohol    Comment: socially   Drug use: No   Sexual activity: Not on file  Other Topics Concern   Not on file  Social History Narrative   Tobacco use, amount per day now: None      Past tobacco use, amount per day: in her late 12s a pack per day      How many years did you use tobacco: 10      Alcohol use (drinks per week): occasional wine      Diet:      Do you drink/eat things with caffeine? Tea, soft drinks and coffee      Marital status: Married             What year were you married? 1962      Do you live in a house, apartment, assisted living, condo, trailer? Apartment      Is it one or more stories? Yes      How many persons live in your home? 0      Do you have any  pets in your home? 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 Yes  How often? Water aerobics       Do you have a living will? Yes      Do you have a DNR form? Yes           If not, do you want to discuss one?      Do you have signed POA/HPOA forms?  Yes            Social Drivers of Corporate investment banker Strain: Low Risk  (02/21/2017)   Overall Financial Resource Strain (CARDIA)    Difficulty of Paying Living Expenses: Not hard at all  Food Insecurity: No Food Insecurity (02/21/2017)   Hunger Vital Sign    Worried About Running Out of Food in the Last Year: Never true    Ran Out of Food in the Last Year: Never true  Transportation Needs: No Transportation Needs (02/21/2017)   PRAPARE - Administrator, Civil Service (Medical): No    Lack of Transportation (Non-Medical): No  Physical Activity: Insufficiently Active (02/21/2017)   Exercise Vital Sign    Days of Exercise per Week: 3 days    Minutes of Exercise per Session: 30 min  Stress: No Stress Concern Present (02/21/2017)   [employer] of Occupational Health - Occupational Stress Questionnaire    Feeling of Stress : Only a little  Social Connections: Moderately Integrated (03/05/2018)   Social Connection and Isolation Panel    Frequency of Communication with Friends and Family: More than three times a week    Frequency of Social Gatherings with Friends and Family: More than three times a week    Attends Religious Services: 1 to 4 times per year    Active Member of [employer] or Organizations: Yes    Attends Banker Meetings: 1 to 4 times per year    Marital Status: Widowed     Family History: The patient's family history includes Colon cancer in her mother; Diabetes in her maternal grandmother; Heart failure in her maternal grandmother; Hypertension in her father; Stroke in her father. There is no history of Rectal cancer, Stomach cancer, Esophageal cancer,  or Pancreatic cancer.  ROS:   Please see the history of present illness.     All other systems reviewed and are negative.  EKGs/Labs/Other Studies Reviewed:    The following studies were reviewed today:       Recent Labs: 03/29/2023: ALT 22 08/28/2023: Hemoglobin 12.6; Platelets 296 08/31/2023: BUN 28; Creatinine, Ser 1.14; Potassium 4.4; Sodium 139  Recent Lipid Panel    Component Value Date/Time   CHOL 321 (H) 04/12/2022 0703   TRIG 381 (H) 04/12/2022 0703   HDL 47 (L) 04/12/2022 0703   CHOLHDL 6.8 (H) 04/12/2022 0703   VLDL 42 (H) 11/22/2016 0740   LDLCALC 209 (H) 04/12/2022 0703   LDLDIRECT 136.7 02/15/2013 1046     Risk Assessment/Calculations:                Physical Exam:    VS:  BP 136/86   Pulse 93   Ht 5' 1.5 (1.562 m)   Wt 203 lb 6.4 oz (92.3 kg)   SpO2 97%   BMI 37.81 kg/m     Wt Readings from Last 3 Encounters:  10/11/23 203 lb 6.4 oz (92.3 kg)  08/31/23 200 lb (90.7 kg)  08/28/23 199 lb 6.4 oz (90.4 kg)     GEN:  Well nourished, well developed in no acute distress HEENT: Normal NECK: No JVD; No carotid bruits  LYMPHATICS: No lymphadenopathy CARDIAC: RRR, no murmurs, rubs, gallops RESPIRATORY:  Clear to auscultation without rales, wheezing or rhonchi  ABDOMEN: Soft, non-tender, non-distended MUSCULOSKELETAL:  No edema; No deformity  SKIN: Warm and dry NEUROLOGIC:  Alert and oriented Mcclure 3 PSYCHIATRIC:  Normal affect  Right radial cath site C/D/I  ASSESSMENT:    1. Coronary artery disease involving native coronary artery of native heart without angina pectoris   2. Hyperlipidemia with target LDL less than 70   3. Statin intolerance   4. Hypertension associated with diabetes (HCC)   5. Stage 3 chronic kidney disease, unspecified whether stage 3a or 3b CKD (HCC)   6. Claudication in peripheral vascular disease (HCC)   7. Gastric ulcer, unspecified chronicity, unspecified whether gastric ulcer hemorrhage or perforation present    PLAN:     In order of problems listed above:  CAD - RFR negative mid RCA stenosis, 30-40% mid LAD stenosis - will consider plavix  monotherapy at follow up - no bleeding issues - no chest pain   HTN - continue 2.5 mg amlodipine , 40 mg telmisartan  - BP well controlled   Hyperlipidemia with LDL goal < 70 - LDL was 209 in Jan 2024 - statin intolerant- has tried several statins, had muscle pain - lipid clinic referral for PCSK9i   Claudication - she would like to defer ABIs for now - sounds more consistent with claudication rather than neuropathy - she will pursue compression stockings and increase exercise   Gynecological procedure - may proceed - may hold plavix  for 5-7 days, continue ASA if OK with surgeon   Epigastric pain - she states she has an ulcer - she had to cancel her appt with GI Oscar Coombs) - she is having issues with the phone system at [facility] - requests help to make a new appt   Follow up in 6 months.      Medication Adjustments/Labs and Tests Ordered: Current medicines are reviewed at length with the patient today.  Concerns regarding medicines are outlined above.  No orders of the defined types were placed in this encounter.  No orders of the defined types were placed in this encounter.   Patient Instructions  Medication Instructions:  No medication changes were made during today's visit.  *If you need a refill on your cardiac medications before your next appointment, please call your pharmacy*   Lab Work: No labs were ordered during today's visit.  If you have labs (blood work) drawn today and your tests are completely normal, you will receive your results only by: MyChart Message (if you have MyChart) OR A paper copy in the mail If you have any lab test that is abnormal or we need to change your treatment, we will call you to review the results.   Testing/Procedures: No procedures were ordered during today's visit.     Follow-Up: At [facility], you and your health needs are our priority.  As part of our continuing mission to provide you with exceptional heart care, we have created designated Provider Care Teams.  These Care Teams include your primary Cardiologist (physician) and Advanced Practice Providers (APPs -  Physician Assistants and Nurse Practitioners) who all work together to provide you with the care you need, when you need it.  We recommend signing up for the patient portal called MyChart.  Sign up information is provided on this After Visit Summary.  MyChart is used to connect with patients for Virtual Visits (Telemedicine).  Patients are able to  view lab/test results, encounter notes, upcoming appointments, etc.  Non-urgent messages can be sent to your provider as well.   To learn more about what you can do with MyChart, go to [email or web address].       Provider:   Jon Hails, PA-C          Other Instructions Thank you for choosing [facility]!          Signed, Jon Nat Hails, [place]  10/11/2023 8:43 AM    [facility]

## 2023-10-11 ENCOUNTER — Encounter: Payer: Self-pay | Admitting: Physician Assistant

## 2023-10-11 ENCOUNTER — Telehealth: Payer: Self-pay

## 2023-10-11 ENCOUNTER — Ambulatory Visit: Attending: Cardiovascular Disease | Admitting: Physician Assistant

## 2023-10-11 VITALS — BP 136/86 | HR 93 | Ht 61.5 in | Wt 203.4 lb

## 2023-10-11 DIAGNOSIS — Z789 Other specified health status: Secondary | ICD-10-CM

## 2023-10-11 DIAGNOSIS — E1159 Type 2 diabetes mellitus with other circulatory complications: Secondary | ICD-10-CM

## 2023-10-11 DIAGNOSIS — I152 Hypertension secondary to endocrine disorders: Secondary | ICD-10-CM

## 2023-10-11 DIAGNOSIS — I2510 Atherosclerotic heart disease of native coronary artery without angina pectoris: Secondary | ICD-10-CM

## 2023-10-11 DIAGNOSIS — I739 Peripheral vascular disease, unspecified: Secondary | ICD-10-CM

## 2023-10-11 DIAGNOSIS — E785 Hyperlipidemia, unspecified: Secondary | ICD-10-CM | POA: Diagnosis not present

## 2023-10-11 DIAGNOSIS — K259 Gastric ulcer, unspecified as acute or chronic, without hemorrhage or perforation: Secondary | ICD-10-CM | POA: Diagnosis not present

## 2023-10-11 DIAGNOSIS — N183 Chronic kidney disease, stage 3 unspecified: Secondary | ICD-10-CM | POA: Diagnosis not present

## 2023-10-11 NOTE — Patient Instructions (Addendum)
 Medication Instructions:  No medication changes were made during today's visit.  *If you need a refill on your cardiac medications before your next appointment, please call your pharmacy*   Lab Work: No labs were ordered during today's visit.  If you have labs (blood work) drawn today and your tests are completely normal, you will receive your results only by: MyChart Message (if you have MyChart) OR A paper copy in the mail If you have any lab test that is abnormal or we need to change your treatment, we will call you to review the results.   Testing/Procedures: No procedures were ordered during today's visit.    Follow-Up: At [facility], you and your health needs are our priority.  As part of our continuing mission to provide you with exceptional heart care, we have created designated Provider Care Teams.  These Care Teams include your primary Cardiologist (physician) and Advanced Practice Providers (APPs -  Physician Assistants and Nurse Practitioners) who all work together to provide you with the care you need, when you need it.  We recommend signing up for the patient portal called MyChart.  Sign up information is provided on this After Visit Summary.  MyChart is used to connect with patients for Virtual Visits (Telemedicine).  Patients are able to view lab/test results, encounter notes, upcoming appointments, etc.  Non-urgent messages can be sent to your provider as well.   To learn more about what you can do with MyChart, go to [email or web address].       Provider:   Jon Hails, PA-C          Other Instructions Thank you for choosing [facility]!      [facility] Address: [address], [place], [place] [address]  Phone: [phone]

## 2023-10-11 NOTE — Telephone Encounter (Signed)
 Patient has been scheduled for a follow up appt with [name], PA-C on 11/02/23 at 2:30 pm. Appt reminder printed and mailed to patient.

## 2023-10-11 NOTE — Telephone Encounter (Signed)
-----   Message from Mcclure JONELLE Coombs sent at 10/11/2023 11:38 AM EDT ----- Please set up follow up OV with Dr. Albertus pod, requesting a letter with appointment.  Thanks, Mcclure ----- Message ----- From: Madie Jon Garre, [place] Sent: 10/11/2023   8:47 AM EDT To: Mcclure JONELLE Coombs, PA-C; Arun K Thukkani, MD  Brittany Mcclure - this patient is having issues with the phone system at [facility]. She says she missed an appt with you guys because she didn't know about it and then had to cancel another one for her heart cath. She is having issue with her ulcer and would like to be rescheduled. She states that a letter in the mail with her new appt date/time would probably be best. Can you please help with this? Thanks so much!

## 2023-10-23 ENCOUNTER — Telehealth: Payer: Self-pay | Admitting: Pharmacist

## 2023-10-23 ENCOUNTER — Other Ambulatory Visit (HOSPITAL_COMMUNITY): Payer: Self-pay

## 2023-10-23 ENCOUNTER — Telehealth: Payer: Self-pay

## 2023-10-23 NOTE — Progress Notes (Deleted)
 Patient ID: Brittany Mcclure                 DOB: [birth year]                    MRN: [id number]      HPI: Brittany Mcclure is a 83 y.o. female patient referred to lipid clinic by Dr.Thukkani.  PMH is significant for nonobstructive CAD, HLD with statin intolerance, DM2, HTN, HLD, aortic atherosclerosis, CKD 3a, and obesity.    Reviewed options for lowering LDL cholesterol, including ezetimibe , PCSK-9 inhibitors, bempedoic acid and inclisiran.  Discussed mechanisms of action, dosing, side effects and potential decreases in LDL cholesterol.  Also reviewed cost information and potential options for patient assistance.  Current Medications: none  Intolerances: lovastatin, rosuvastatin, atorvastatin, Zetia , - myalgia gemfibrozil - itching  Risk Factors: CAD, HLD with statin intolerance, DM2, HTN, HLD, aortic atherosclerosis, CKD 3a, and obesity. LDL goal: <55 mg/dl  Last lab 92/7974 J8r 6.6  Last lipid lab 03/2023 TC 321, TG 381, HDL 47, LDL 209  Diet:   Exercise:   Family History:   Social History:   Labs:  Lipid Panel     Component Value Date/Time   CHOL 321 (H) 04/12/2022 0703   TRIG 381 (H) 04/12/2022 0703   HDL 47 (L) 04/12/2022 0703   CHOLHDL 6.8 (H) 04/12/2022 0703   VLDL 42 (H) 11/22/2016 0740   LDLCALC 209 (H) 04/12/2022 0703   LDLDIRECT 136.7 02/15/2013 1046    Past Medical History:  Diagnosis Date   Allergy    Anxiety    Arthritis    with gout   Carotid bruit    right   Cataract    bil cataracts removed   Chest pain    Chronic kidney disease    Colon polyps    Depression    Diverticulosis    Duodenitis    Exogenous obesity    Gastric ulcer    GERD (gastroesophageal reflux disease)    Hyperlipidemia    Hypertension    Metabolic syndrome    Osteopenia    Seasonal allergies    seasonal   Tubular adenoma of colon    Type 2 diabetes mellitus (HCC)     Current Outpatient Medications on File Prior to Visit  Medication Sig Dispense Refill   Accu-Chek  FastClix Lancets MISC USE AS DIRECTED. 200 each 2   acetaminophen  (TYLENOL ) 500 MG tablet Take 500 mg by mouth every morning.     acetaminophen  (TYLENOL ) 650 MG CR tablet Take 650 mg by mouth at bedtime.     ALPRAZolam  (XANAX ) 0.25 MG tablet TAKE ONE TABLET BY MOUTH TWICE DAILY AS NEEDED 60 tablet 1   amLODipine  (NORVASC ) 2.5 MG tablet Take 1 tablet (2.5 mg total) by mouth daily. 30 tablet 5   aspirin  EC 81 MG tablet Take 1 tablet (81 mg total) by mouth daily. Swallow whole.     CALCIUM PO Take 600 mg by mouth daily.     Cholecalciferol (VITAMIN D3) 125 MCG (5000 UT) TABS Take 5,000 Units by mouth daily.     clopidogrel  (PLAVIX ) 75 MG tablet Take 1 tablet (75 mg total) by mouth daily. 90 tablet 3   cyanocobalamin  (VITAMIN B12) 1000 MCG tablet Take 1 tablet (1,000 mcg total) by mouth 2 (two) times a week.     esomeprazole  (NEXIUM ) 40 MG capsule Take 1 capsule (40 mg total) by mouth daily. 90 capsule 3   FLUoxetine  (PROZAC ) 40 MG capsule  TAKE ONE CAPSULE BY MOUTH EVERY DAY. 90 capsule 1   glipiZIDE  (GLUCOTROL ) 5 MG tablet Take 1 tablet (5 mg total) by mouth 2 (two) times daily before a meal. 180 tablet 1   glucose blood (ACCU-CHEK SMARTVIEW) test strip CHECK BLOOD SUGAR 3 TIMES DAILY AS DIRECTED. 200 strip 2   Glycerin-Hypromellose-PEG 400 (DRY EYE RELIEF DROPS OP) Place 1 drop into both eyes daily as needed (Dry eye).     Multiple Vitamins-Minerals (PRESERVISION AREDS PO) Take 1 tablet by mouth in the morning and at bedtime.     Omega-3 Fatty Acids (FISH OIL) 1000 MG CAPS Take 1,000 mg by mouth daily.     telmisartan  (MICARDIS ) 40 MG tablet Take 1 tablet (40 mg total) by mouth daily. 90 tablet 3   triamcinolone  cream (KENALOG ) 0.1 % Apply 1 Application topically 2 (two) times daily. Apply 2 times daily to the back for up to 2 weeks then STOP, Resume in 2 weeks if irritation is not resolved (Patient taking differently: Apply 1 Application topically as needed (skin irritation). Apply 2 times daily to  the back for up to 2 weeks then STOP, Resume in 2 weeks if irritation is not resolved) 453.6 g 2   No current facility-administered medications on file prior to visit.    Allergies  Allergen Reactions   Crestor [Rosuvastatin Calcium]     Myalgias    Elemental Sulfur     Made infection worse   Gemfibrozil  Itching   Iodine     Skin rash   Lipitor [Atorvastatin Calcium]     Myalgias    Lovastatin     myalgias   Sulfonamide Derivatives     REACTION: n\T\v   Zetia  [Ezetimibe ]     Cough, dry    Assessment/Plan:  1. Hyperlipidemia -  No problems updated. No problem-specific Assessment & Plan notes found for this encounter.    Thank you,  Robbi Blanch, Pharm.D [facility]. [facility] [address], [place], [place] [address] Phone: [phone]; Fax: [phone]

## 2023-10-23 NOTE — Telephone Encounter (Signed)
 Pharmacy Patient Advocate Encounter   Received notification from Physician's Office that prior authorization for REPATHA is required/requested.   Insurance verification completed.   The patient is insured through [place] .   Per test claim: PA required; PA submitted to above mentioned insurance via CoverMyMeds Key/confirmation #/EOC [facility] Status is pending

## 2023-10-23 NOTE — Telephone Encounter (Signed)
 PA request has been Submitted. New Encounter has been or will be created for follow up. For additional info see Pharmacy Prior Auth telephone encounter from 10/23/23.

## 2023-10-23 NOTE — Telephone Encounter (Signed)
 Pharmacy Patient Advocate Encounter  Received notification from [facility] that Prior Authorization for REPATHA has been APPROVED from 03/22/23 to 03/20/24. Ran test claim, Copay is $40. This test claim was processed through [facility]- copay amounts may vary at other pharmacies due to pharmacy/plan contracts, or as the patient moves through the different stages of their insurance plan.

## 2023-10-24 ENCOUNTER — Ambulatory Visit: Attending: Pharmacist | Admitting: Pharmacist

## 2023-10-30 ENCOUNTER — Other Ambulatory Visit: Payer: Self-pay | Admitting: Nurse Practitioner

## 2023-11-01 DIAGNOSIS — H40023 Open angle with borderline findings, high risk, bilateral: Secondary | ICD-10-CM | POA: Diagnosis not present

## 2023-11-01 NOTE — Progress Notes (Signed)
 Brittany Console, PA-C [address] [place], [place]  [address] Phone: [phone]   Primary Care Physician: Brittany Mcclure, Brittany X, NP  Primary Gastroenterologist:  Brittany Console, PA-C / Dr. Gordy Mcclure   Chief Complaint: Follow-up chronic GERD       HPI:   Brittany Mcclure is a 83 y.o. female returns for 31-month follow-up of chronic GERD.  She last saw Brittany Jointer, PA-C in our office 06/19/2023.  Treated with Nexium  40 Mg daily.  Advised to remain off Ozempic .  Current Symptoms: She is currently feeling a lot better.  She has no more heartburn or acid reflux.  She still has occasional discomfort in the epigastrium and left upper quadrant.  She had similar pain when she had an ulcer many years ago.  It is mild and comes and goes.  She denies NSAID use.  Denies nausea, melena or other alarm symptoms.  She avoids GERD trigger foods.  Of note, she is scheduled for laparoscopic oophorectomy tomorrow through GYN.  PMH:  Hypertension, GERD, type 2 diabetes CKD stage IIIa, dyslipidemia, anxiety, anemia of CKD 3, history of PUD, family history of colon cancer in mother, diverticulosis, history of adenomatous polyps. Hx of nonobstructive CAD.  Currently on Plavix  and aspirin .   05/2018 Colonoscopy by Dr. Starch: 3 mm polyp ascending colon diverticula sigmoid and descending colon.  Polyp was benign no recall for age  57/2020 EGD: 4 cm hiatal hernia, normal mucosa in entire esophagus, erosive gastritis, normal duodenum.  Path with negative H. pylori no abnormal cells.  2017 EGD with gastric ulcers   03/30/2023 CT abdomen pelvis with contrast sigmoid diverticulosis no diverticulitis 8 cm leave adnexal cyst  03/31/2023 abdominal pelvic ultrasound showed thickened endometrium simple appearing left adnexal cyst referred to GYN   Current Outpatient Medications  Medication Sig Dispense Refill   Accu-Chek FastClix Lancets MISC USE AS DIRECTED. 200 each 2   acetaminophen  (TYLENOL ) 500 MG tablet Take 500 mg  by mouth every morning.     acetaminophen  (TYLENOL ) 650 MG CR tablet Take 650 mg by mouth at bedtime.     ALPRAZolam  (XANAX ) 0.25 MG tablet TAKE ONE TABLET BY MOUTH TWICE DAILY AS NEEDED (Patient taking differently: Take 0.25 mg by mouth daily.) 60 tablet 1   amLODipine  (NORVASC ) 2.5 MG tablet Take 1 tablet (2.5 mg total) by mouth daily. 30 tablet 5   aspirin  EC 81 MG tablet Take 1 tablet (81 mg total) by mouth daily. Swallow whole.     Calcium Carb-Cholecalciferol (CALCIUM 600/VITAMIN D3 PO) Take 1 tablet by mouth at bedtime.     Cholecalciferol (VITAMIN D3) 125 MCG (5000 UT) TABS Take 5,000 Units by mouth daily.     clopidogrel  (PLAVIX ) 75 MG tablet Take 1 tablet (75 mg total) by mouth daily. 90 tablet 3   esomeprazole  (NEXIUM ) 40 MG capsule Take 1 capsule (40 mg total) by mouth daily. 90 capsule 3   FLUoxetine  (PROZAC ) 40 MG capsule TAKE ONE CAPSULE BY MOUTH EVERY DAY. 90 capsule 1   glipiZIDE  (GLUCOTROL ) 5 MG tablet Take 1 tablet (5 mg total) by mouth 2 (two) times daily before a meal. 180 tablet 1   glucose blood (ACCU-CHEK SMARTVIEW) test strip CHECK BLOOD SUGAR 3 TIMES DAILY AS DIRECTED. 200 strip 2   Glycerin-Hypromellose-PEG 400 (DRY EYE RELIEF DROPS OP) Place 1 drop into both eyes daily as needed (Dry eye).     Multiple Vitamins-Minerals (PRESERVISION AREDS PO) Take 1 tablet by mouth in  the morning and at bedtime.     Omega-3 Fatty Acids (FISH OIL) 1000 MG CAPS Take 1,000 mg by mouth daily.     telmisartan  (MICARDIS ) 40 MG tablet Take 1 tablet (40 mg total) by mouth daily. (Patient not taking: Reported on 11/01/2023) 90 tablet 3   triamcinolone  cream (KENALOG ) 0.1 % Apply 1 Application topically 2 (two) times daily. Apply 2 times daily to the back for up to 2 weeks then STOP, Resume in 2 weeks if irritation is not resolved (Patient taking differently: Apply 1 Application topically as needed (skin irritation). Apply 2 times daily to the back for up to 2 weeks then STOP, Resume in 2 weeks if  irritation is not resolved) 453.6 g 2   No current facility-administered medications for this visit.    Allergies as of 11/02/2023 - Review Complete 11/01/2023  Allergen Reaction Noted   Crestor [rosuvastatin calcium]  06/30/2010   Iodine   08/26/2015   Lipitor [atorvastatin calcium]  06/30/2010   Lopid  [gemfibrozil ] Itching 11/30/2017   Lovastatin  06/30/2010   Sulfa antibiotics Nausea And Vomiting 11/01/2023   Sulfonamide derivatives     Zetia  [ezetimibe ]  05/04/2015    Past Medical History:  Diagnosis Date   Allergy    Anxiety    Arthritis    with gout   Carotid bruit    right   Cataract    bil cataracts removed   Chest pain    Chronic kidney disease    Colon polyps    Depression    Diverticulosis    Duodenitis    Exogenous obesity    Gastric ulcer    GERD (gastroesophageal reflux disease)    Hyperlipidemia    Hypertension    Metabolic syndrome    Osteopenia    Seasonal allergies    seasonal   Tubular adenoma of colon    Type 2 diabetes mellitus (HCC)     Past Surgical History:  Procedure Laterality Date   COLONOSCOPY     11/90,11/91,1994,2000,2010   CORONARY PRESSURE/FFR STUDY N/A 08/31/2023   Procedure: CORONARY PRESSURE/FFR STUDY;  Surgeon: Mady Bruckner, MD;  Location: [facility];  Service: Cardiovascular;  Laterality: N/A;   DILATION AND CURETTAGE OF UTERUS     DILATION AND CURETTAGE OF UTERUS  1997   FOOT SURGERY     Mcclure 2   HAND SURGERY Right    Carpel Tunnel   LEFT HEART CATH AND CORONARY ANGIOGRAPHY N/A 08/31/2023   Procedure: LEFT HEART CATH AND CORONARY ANGIOGRAPHY;  Surgeon: Mady Bruckner, MD;  Location: [facility];  Service: Cardiovascular;  Laterality: N/A;   POLYPECTOMY     TUBAL LIGATION      Review of Systems:    All systems reviewed and negative except where noted in HPI.    Physical Exam:  There were no vitals taken for this visit. No LMP recorded. Patient is postmenopausal.  General: Well-nourished,  well-developed in no acute distress.  Lungs: Clear to auscultation bilaterally. Non-labored. Heart: Regular rate and rhythm, no murmurs rubs or gallops.  Abdomen: Bowel sounds are normal; Abdomen is Soft; No hepatosplenomegaly, masses or hernias;  No Abdominal Tenderness; No guarding or rebound tenderness. Neuro: Alert and oriented Mcclure 3.  Grossly intact.  Psych: Alert and cooperative, normal mood and affect.   Imaging Studies: No results found.  Labs: CBC    Component Value Date/Time   WBC 8.9 08/28/2023 1622   WBC 9.7 03/29/2023 1105   RBC 4.13 08/28/2023 1622  RBC 4.11 03/29/2023 1105   HGB 12.6 08/28/2023 1622   HCT 38.7 08/28/2023 1622   PLT 296 08/28/2023 1622   MCV 94 08/28/2023 1622   MCH 30.5 08/28/2023 1622   MCH 31.4 04/12/2022 0703   MCHC 32.6 08/28/2023 1622   MCHC 32.4 03/29/2023 1105   RDW 12.8 08/28/2023 1622   LYMPHSABS 1.8 03/29/2023 1105   MONOABS 0.6 03/29/2023 1105   EOSABS 0.2 03/29/2023 1105   BASOSABS 0.1 03/29/2023 1105    CMP     Component Value Date/Time   NA 139 08/31/2023 0631   NA 140 08/28/2023 1622   K 4.4 08/31/2023 0631   CL 106 08/31/2023 0631   CO2 23 08/31/2023 0631   GLUCOSE 90 08/31/2023 0631   BUN 28 (H) 08/31/2023 0631   BUN 29 (H) 08/28/2023 1622   CREATININE 1.14 (H) 08/31/2023 0631   CREATININE 1.10 (H) 04/12/2022 0703   CALCIUM 9.2 08/31/2023 0631   PROT 6.6 03/29/2023 1105   ALBUMIN 4.3 03/29/2023 1105   AST 21 03/29/2023 1105   ALT 22 03/29/2023 1105   ALKPHOS 85 03/29/2023 1105   BILITOT 0.4 03/29/2023 1105   GFRNONAA 47 (L) 08/31/2023 0631   GFRNONAA 45 (L) 06/02/2020 0715   GFRAA 52 (L) 06/02/2020 0715       Assessment and Plan:   Brittany Mcclure is a 83 y.o. y/o female returns for follow-up of:  1.  GERD: Improved and controlled on Nexium  40 Mg daily 2.  Epigastric and LUQ pain: Mild and intermittent 3.  History of chronic gastritis and gastric ulcers (H. pylori negative)  Plan: - Continue Nexium   40 Mg once daily, #90, 3 refills. - Avoid NSAIDs. - We discussed scheduling EGD, and patient declined. - Start Rx Carafate  1 g 3 times daily before meals as needed for epigastric discomfort and gastritis. - Follow-up if symptoms worsen or persist.  Brittany Console, PA-C  Follow up if symptoms worsen or fail to improve.

## 2023-11-02 ENCOUNTER — Encounter: Payer: Self-pay | Admitting: Physician Assistant

## 2023-11-02 ENCOUNTER — Ambulatory Visit: Admitting: Physician Assistant

## 2023-11-02 ENCOUNTER — Telehealth (HOSPITAL_BASED_OUTPATIENT_CLINIC_OR_DEPARTMENT_OTHER): Payer: Self-pay | Admitting: Obstetrics & Gynecology

## 2023-11-02 VITALS — BP 130/70 | HR 90 | Ht 61.5 in | Wt 201.0 lb

## 2023-11-02 DIAGNOSIS — K219 Gastro-esophageal reflux disease without esophagitis: Secondary | ICD-10-CM | POA: Diagnosis not present

## 2023-11-02 DIAGNOSIS — K2930 Chronic superficial gastritis without bleeding: Secondary | ICD-10-CM

## 2023-11-02 DIAGNOSIS — K2950 Unspecified chronic gastritis without bleeding: Secondary | ICD-10-CM | POA: Diagnosis not present

## 2023-11-02 MED ORDER — ESOMEPRAZOLE MAGNESIUM 40 MG PO CPDR: 40.0000 mg | DELAYED_RELEASE_CAPSULE | Freq: Every day | ORAL | 3 refills | Status: AC

## 2023-11-02 MED ORDER — SUCRALFATE 1 G PO TABS: 1.0000 g | ORAL_TABLET | Freq: Three times a day (TID) | ORAL | 5 refills | Status: AC | PRN

## 2023-11-02 NOTE — Telephone Encounter (Signed)
 New message   The patient's daughter called, is concerned about her mother's upcoming surgery on 11/06/23, and that they have not received any procedure instructions

## 2023-11-02 NOTE — Patient Instructions (Signed)
 We have sent the following medications to your pharmacy for you to pick up at your convenience: Sucralfate  1 gram 3 times daily as needed  Continue Nexium  40 mg once daily   Please follow up sooner if symptoms increase or worsen  Due to recent changes in healthcare laws, you may see the results of your imaging and laboratory studies on MyChart before your provider has had a chance to review them.  We understand that in some cases there may be results that are confusing or concerning to you. Not all laboratory results come back in the same time frame and the provider may be waiting for multiple results in order to interpret others.  Please give us  48 hours in order for your provider to thoroughly review all the results before contacting the office for clarification of your results.   Thank you for trusting me with your gastrointestinal care!   [name], PA-C _______________________________________________________  If your blood pressure at your visit was 140/90 or greater, please contact your primary care physician to follow up on this.  _______________________________________________________  If you are age 85 or older, your body mass index should be between 23-30. Your Body mass index is 37.36 kg/m. If this is out of the aforementioned range listed, please consider follow up with your Primary Care Provider.  If you are age 39 or younger, your body mass index should be between 19-25. Your Body mass index is 37.36 kg/m. If this is out of the aformentioned range listed, please consider follow up with your Primary Care Provider.   ________________________________________________________  The [facility] providers would like to encourage you to use MYCHART to communicate with providers for non-urgent requests or questions.  Due to long hold times on the telephone, sending your provider a message by [facility] may be a faster and more efficient way to get a response.  Please allow 48 business  hours for a response.  Please remember that this is for non-urgent requests.  _______________________________________________________

## 2023-11-03 ENCOUNTER — Encounter (HOSPITAL_COMMUNITY): Payer: Self-pay | Admitting: Obstetrics & Gynecology

## 2023-11-03 ENCOUNTER — Other Ambulatory Visit: Payer: Self-pay

## 2023-11-03 NOTE — Telephone Encounter (Signed)
 Spoke with patient's daughter Reena. Advised the pre-op nurse from the hospital will be calling today to review surgery information and answer any questions. Advised patient will need to arrive 2 hours prior to surgery. Sharon verbalizes understanding. Reena is concerned about receiving the phone call. Advised I will call her back this afternoon to verify they received the phone call.

## 2023-11-03 NOTE — Anesthesia Preprocedure Evaluation (Signed)
 Anesthesia Evaluation  Patient identified by MRN, date of birth, ID band Patient awake    Reviewed: Allergy & Precautions, NPO status , Patient's Chart, lab work & pertinent test results  History of Anesthesia Complications Negative for: history of anesthetic complications  Airway Mallampati: III  TM Distance: >3 FB Neck ROM: Full    Dental no notable dental hx. (+) Missing, Chipped   Pulmonary neg pulmonary ROS, neg sleep apnea, neg COPD, Patient abstained from smoking.Not current smoker, former smoker   Pulmonary exam normal breath sounds clear to auscultation       Cardiovascular Exercise Tolerance: Good METShypertension, Pt. on medications + CAD  (-) Past MI (-) dysrhythmias  Rhythm:Regular Rate:Normal - Systolic murmurs EKG 08/31/23: Normal sinus rhythm. Rate 73. Nonspecific T wave abnormality   Cath 08/31/23: Conclusions: 1. Moderate to severe but not critical single-vessel CAD with 70-80% stenosis of mid RCA that is not hemodynamically significant (RFR = 0.92).  Mild, non-obstructive disease noted in proximal to mid LAD with 30-40% stenosis.  20% ostial ramus intermedius stenosis noted.  Small LCx without significant disease. 2. Normal left ventricular filling pressure (LVEDP 10-15 mmHg).   Recommendations: 1. Initiate antianginal therapy; will add amlodipine  2.5 mg daily. 2. Consider adding PCSK9 inhibitor to prevent progression of CAD, given statin intolerance. 3. Consider holding clopidogrel  given that PCI was not performed and patient is scheduled for gynecologic surgery next month; will defer to primary cardiology team.    Neuro/Psych  PSYCHIATRIC DISORDERS Anxiety Depression    negative neurological ROS     GI/Hepatic PUD,GERD  Medicated and Controlled,,(+)     (-) substance abuse    Endo/Other  diabetes, Well Controlled, Oral Hypoglycemic Agents    Renal/GU Renal InsufficiencyRenal disease      Musculoskeletal   Abdominal  (+) + obese  Peds  Hematology   Anesthesia Other Findings Past Medical History: No date: Allergy No date: Anxiety No date: Arthritis     Comment:  with gout No date: Carotid bruit     Comment:  right No date: Cataract     Comment:  bil cataracts removed No date: Chest pain No date: Chronic kidney disease No date: Colon polyps No date: Depression No date: Diverticulosis No date: Duodenitis No date: Exogenous obesity No date: Gastric ulcer No date: GERD (gastroesophageal reflux disease) No date: Hyperlipidemia No date: Hypertension No date: Metabolic syndrome No date: Osteopenia No date: Seasonal allergies     Comment:  seasonal No date: Tubular adenoma of colon No date: Type 2 diabetes mellitus (HCC)  Reproductive/Obstetrics                              Anesthesia Physical Anesthesia Plan  ASA: 3  Anesthesia Plan: General   Post-op Pain Management: Tylenol  PO (pre-op)* and Toradol  IV (intra-op)*   Induction: Intravenous  PONV Risk Score and Plan: 4 or greater and Ondansetron , Dexamethasone  and Treatment may vary due to age or medical condition  Airway Management Planned: Oral ETT  Additional Equipment: None  Intra-op Plan:   Post-operative Plan: Extubation in OR  Informed Consent: I have reviewed the patients History and Physical, chart, labs and discussed the procedure including the risks, benefits and alternatives for the proposed anesthesia with the patient or authorized representative who has indicated his/her understanding and acceptance.     Dental advisory given  Plan Discussed with: CRNA and Surgeon  Anesthesia Plan Comments: (Discussed risks of anesthesia with patient,  including PONV, sore throat, lip/dental/eye damage, post operative cognitive dysfunction. Rare risks discussed as well, such as cardiorespiratory and neurological sequelae, and allergic reactions. Discussed the role of CRNA  in patient's perioperative care. Patient understands.)         Anesthesia Quick Evaluation

## 2023-11-03 NOTE — Telephone Encounter (Signed)
 Left message for patient's daughter Reena at [phone] requesting a return call to the office at [phone].  Pre op nurse from the hospital will be in contact today to provide surgery information and answer all questions. Patient will need to arrive 2 hours before surgery.

## 2023-11-03 NOTE — Progress Notes (Signed)
 Anesthesia Chart Review: Same day workup  83 year old female follows with cardiology for history of nonobstructive CAD, HLD, HTN, aortic atherosclerosis. Chest pain lead to abnormal CTA coronary which prompted definitive angiography.  LHC on 08/31/23 which revealed 75% stenosis in the mid RCA that was not hemodynamically significant (RFR 0.92) - no PCI. She was started on plavix .  Last seen in follow-up by Jon Hails, PA-C on 10/11/2023.  Per note, She presents for follow up after heart cath. She feels well, remains active. Describes leg pain after a lot of walking, could be claudication. Cath site without hematoma. No bleeding with ASA and plavix .SABRASABRAGynecological procedure - may proceed - may hold plavix  for 5-7 days, continue ASA if OK with surgeon.  Other pertinent history includes GERD/PUD on PPI, non-insulin -dependent DM2, CKD 3A, anemia, class II obesity BMI 37.  Pt will need DOS labs and eval.  EKG 08/31/23: Normal sinus rhythm. Rate 73. Nonspecific T wave abnormality  Cath 08/31/23: Conclusions: Moderate to severe but not critical single-vessel CAD with 70-80% stenosis of mid RCA that is not hemodynamically significant (RFR = 0.92).  Mild, non-obstructive disease noted in proximal to mid LAD with 30-40% stenosis.  20% ostial ramus intermedius stenosis noted.  Small LCx without significant disease. Normal left ventricular filling pressure (LVEDP 10-15 mmHg).   Recommendations: Initiate antianginal therapy; will add amlodipine  2.5 mg daily. Consider adding PCSK9 inhibitor to prevent progression of CAD, given statin intolerance. Consider holding clopidogrel  given that PCI was not performed and patient is scheduled for gynecologic surgery next month; will defer to primary cardiology team.  TTE 08/17/23:  1. Left ventricular ejection fraction, by estimation, is 55 to 60%. Left  ventricular ejection fraction by 3D volume is 60 %. The left ventricle has  normal function. The left ventricle has  no regional wall motion  abnormalities. There is mild left  ventricular hypertrophy of the basal-septal segment. Left ventricular  diastolic parameters are indeterminate. Elevated left atrial pressure. The  E/e' is 16.5. The average left ventricular global longitudinal strain is  -21.4 %. The global longitudinal  strain is normal.   2. Right ventricular systolic function is normal. The right ventricular  size is normal. There is normal pulmonary artery systolic pressure.   3. The mitral valve is normal in structure. Mild mitral valve  regurgitation. No evidence of mitral stenosis.   4. The aortic valve is tricuspid. Aortic valve regurgitation is trivial.  No aortic stenosis is present.   5. The inferior vena cava is normal in size with greater than 50%  respiratory variability, suggesting right atrial pressure of 3 mmHg.       [name] [facility] Short Stay Center/Anesthesiology Phone [phone] 11/03/2023 9:36 AM

## 2023-11-03 NOTE — Progress Notes (Signed)
 PCP - Mast, Man X, NP Cardiologist - Christopher End MD  EKG - 08/31/2023 Chest x-ray - Denies ECHO - 08/17/2023 Cardiac Cath - 08/31/2023  Sleep Study- denies  DM Type II Fasting Blood Sugar:  120-150 Checks Blood Sugar:  twice a week  Blood Thinner Instructions: Plavix  last dose 10/31/2023 Aspirin  Instructions: Yes  ERAS Protcol - NPO COVID TEST- N/A  Anesthesia review: Yes  -------------  SDW INSTRUCTIONS:  Your procedure is scheduled on Monday Aug 18. Please report to [facility] Main Entrance A at 0945 A.M., and check in at the Admitting office. Call this number if you have problems the morning of surgery: [phone]   Remember: Do not eat or dink after midnight the night before your surgery   Medications to take morning of surgery with a sip of water include: acetaminophen  (TYLENOL )  ALPRAZolam  (XANAX )  amLODipine  (NORVASC )  aspirin  EC 81  esomeprazole  (NEXIUM )  FLUoxetine  (PROZAC )   As of today, STOP taking any Aspirin  (unless otherwise instructed by your surgeon), Aleve, Naproxen, Ibuprofen, Motrin, Advil, Goody's, BC's, all herbal medications, fish oil, and all vitamins.  WHAT DO I DO ABOUT MY DIABETES MEDICATION?   Do not take oral diabetes medicines (pills) the morning of surgery.  The day of surgery, do not take other diabetes injectables, including Byetta (exenatide), Bydureon (exenatide ER), Victoza (liraglutide), or Trulicity (dulaglutide).  If your CBG is greater than 220 mg/dL, you may take  of your sliding scale (correction) dose of insulin .   HOW TO MANAGE YOUR DIABETES BEFORE AND AFTER SURGERY  Why is it important to control my blood sugar before and after surgery? Improving blood sugar levels before and after surgery helps healing and can limit problems. A way of improving blood sugar control is eating a healthy diet by:  Eating less sugar and carbohydrates  Increasing activity/exercise  Talking with your doctor about reaching your  blood sugar goals High blood sugars (greater than 180 mg/dL) can raise your risk of infections and slow your recovery, so you will need to focus on controlling your diabetes during the weeks before surgery. Make sure that the doctor who takes care of your diabetes knows about your planned surgery including the date and location.  How do I manage my blood sugar before surgery? Check your blood sugar at least 4 times a day, starting 2 days before surgery, to make sure that the level is not too high or low.  Check your blood sugar the morning of your surgery when you wake up and every 2 hours until you get to the Short Stay unit.  If your blood sugar is less than 70 mg/dL, you will need to treat for low blood sugar: Do not take insulin . Treat a low blood sugar (less than 70 mg/dL) with  cup of clear juice (cranberry or apple), 4 glucose tablets, OR glucose gel. Recheck blood sugar in 15 minutes after treatment (to make sure it is greater than 70 mg/dL). If your blood sugar is not greater than 70 mg/dL on recheck, call [phone] for further instructions. Report your blood sugar to the short stay nurse when you get to Short Stay.  If you are admitted to the hospital after surgery: Your blood sugar will be checked by the staff and you will probably be given insulin  after surgery (instead of oral diabetes medicines) to make sure you have good blood sugar levels. The goal for blood sugar control after surgery is 80-180 mg/dL.    The Morning of Surgery  Do not wear jewelry, make-up or nail polish. Do not wear lotions, powders, or perfumes/colognes, or deodorant Do not bring valuables to the hospital. [facility] is not responsible for any belongings or valuables.  If you are a smoker, DO NOT Smoke 24 hours prior to surgery  If you wear a CPAP at night please bring your mask the morning of surgery   Remember that you must have someone to transport you home after your surgery, and remain with  you for 24 hours if you are discharged the same day.  Please bring cases for contacts, glasses, hearing aids, dentures or bridgework because it cannot be worn into surgery.   Patients discharged the day of surgery will not be allowed to drive home.   Please shower the NIGHT BEFORE/MORNING OF SURGERY (use antibacterial soap like DIAL soap if possible). Wear comfortable clothes the morning of surgery. Oral Hygiene is also important to reduce your risk of infection.  Remember - BRUSH YOUR TEETH THE MORNING OF SURGERY WITH YOUR REGULAR TOOTHPASTE  Patient denies shortness of breath, fever, cough and chest pain.

## 2023-11-06 ENCOUNTER — Other Ambulatory Visit: Payer: Self-pay

## 2023-11-06 ENCOUNTER — Ambulatory Visit (HOSPITAL_COMMUNITY): Payer: Self-pay | Admitting: Physician Assistant

## 2023-11-06 ENCOUNTER — Ambulatory Visit (HOSPITAL_COMMUNITY)
Admission: RE | Admit: 2023-11-06 | Discharge: 2023-11-06 | Disposition: A | Discharge status: Home / Self Care | Attending: Obstetrics & Gynecology | Admitting: Obstetrics & Gynecology

## 2023-11-06 ENCOUNTER — Encounter (HOSPITAL_COMMUNITY): Payer: Self-pay | Admitting: Obstetrics & Gynecology

## 2023-11-06 ENCOUNTER — Encounter (HOSPITAL_COMMUNITY)
Admission: RE | Disposition: A | Discharge status: Home / Self Care | Payer: Self-pay | Source: Home / Self Care | Attending: Obstetrics & Gynecology

## 2023-11-06 DIAGNOSIS — F419 Anxiety disorder, unspecified: Secondary | ICD-10-CM | POA: Diagnosis not present

## 2023-11-06 DIAGNOSIS — R935 Abnormal findings on diagnostic imaging of other abdominal regions, including retroperitoneum: Secondary | ICD-10-CM | POA: Diagnosis not present

## 2023-11-06 DIAGNOSIS — E1122 Type 2 diabetes mellitus with diabetic chronic kidney disease: Secondary | ICD-10-CM | POA: Insufficient documentation

## 2023-11-06 DIAGNOSIS — N839 Noninflammatory disorder of ovary, fallopian tube and broad ligament, unspecified: Secondary | ICD-10-CM | POA: Diagnosis not present

## 2023-11-06 DIAGNOSIS — Z7902 Long term (current) use of antithrombotics/antiplatelets: Secondary | ICD-10-CM | POA: Insufficient documentation

## 2023-11-06 DIAGNOSIS — Z87891 Personal history of nicotine dependence: Secondary | ICD-10-CM | POA: Diagnosis not present

## 2023-11-06 DIAGNOSIS — Z79899 Other long term (current) drug therapy: Secondary | ICD-10-CM | POA: Insufficient documentation

## 2023-11-06 DIAGNOSIS — I129 Hypertensive chronic kidney disease with stage 1 through stage 4 chronic kidney disease, or unspecified chronic kidney disease: Secondary | ICD-10-CM | POA: Insufficient documentation

## 2023-11-06 DIAGNOSIS — I2510 Atherosclerotic heart disease of native coronary artery without angina pectoris: Secondary | ICD-10-CM

## 2023-11-06 DIAGNOSIS — N83202 Unspecified ovarian cyst, left side: Secondary | ICD-10-CM | POA: Diagnosis not present

## 2023-11-06 DIAGNOSIS — N83201 Unspecified ovarian cyst, right side: Secondary | ICD-10-CM | POA: Diagnosis not present

## 2023-11-06 DIAGNOSIS — K219 Gastro-esophageal reflux disease without esophagitis: Secondary | ICD-10-CM | POA: Diagnosis not present

## 2023-11-06 DIAGNOSIS — N840 Polyp of corpus uteri: Secondary | ICD-10-CM

## 2023-11-06 DIAGNOSIS — Z8711 Personal history of peptic ulcer disease: Secondary | ICD-10-CM | POA: Diagnosis not present

## 2023-11-06 DIAGNOSIS — N1831 Chronic kidney disease, stage 3a: Secondary | ICD-10-CM | POA: Insufficient documentation

## 2023-11-06 DIAGNOSIS — K5730 Diverticulosis of large intestine without perforation or abscess without bleeding: Secondary | ICD-10-CM | POA: Insufficient documentation

## 2023-11-06 DIAGNOSIS — Z7984 Long term (current) use of oral hypoglycemic drugs: Secondary | ICD-10-CM | POA: Insufficient documentation

## 2023-11-06 DIAGNOSIS — N838 Other noninflammatory disorders of ovary, fallopian tube and broad ligament: Secondary | ICD-10-CM | POA: Insufficient documentation

## 2023-11-06 DIAGNOSIS — E66812 Obesity, class 2: Secondary | ICD-10-CM | POA: Insufficient documentation

## 2023-11-06 DIAGNOSIS — E785 Hyperlipidemia, unspecified: Secondary | ICD-10-CM | POA: Diagnosis not present

## 2023-11-06 DIAGNOSIS — Z6837 Body mass index (BMI) 37.0-37.9, adult: Secondary | ICD-10-CM | POA: Insufficient documentation

## 2023-11-06 DIAGNOSIS — J45909 Unspecified asthma, uncomplicated: Secondary | ICD-10-CM | POA: Diagnosis not present

## 2023-11-06 DIAGNOSIS — F32A Depression, unspecified: Secondary | ICD-10-CM | POA: Insufficient documentation

## 2023-11-06 DIAGNOSIS — N739 Female pelvic inflammatory disease, unspecified: Secondary | ICD-10-CM | POA: Diagnosis not present

## 2023-11-06 DIAGNOSIS — N7011 Chronic salpingitis: Secondary | ICD-10-CM | POA: Diagnosis not present

## 2023-11-06 DIAGNOSIS — Z7982 Long term (current) use of aspirin: Secondary | ICD-10-CM | POA: Insufficient documentation

## 2023-11-06 DIAGNOSIS — Z01818 Encounter for other preprocedural examination: Secondary | ICD-10-CM

## 2023-11-06 HISTORY — PX: HYSTEROSCOPY WITH D & C: SHX1775

## 2023-11-06 HISTORY — PX: LAPAROSCOPIC UNILATERAL SALPINGO OOPHERECTOMY: SHX5935

## 2023-11-06 LAB — ABO/RH: ABO/RH(D): O POS

## 2023-11-06 LAB — BASIC METABOLIC PANEL WITH GFR
Anion gap: 13 (ref 5–15)
BUN: 25 mg/dL — ABNORMAL HIGH (ref 8–23)
CO2: 22 mmol/L (ref 22–32)
Calcium: 9.2 mg/dL (ref 8.9–10.3)
Chloride: 104 mmol/L (ref 98–111)
Creatinine, Ser: 1.27 mg/dL — ABNORMAL HIGH (ref 0.44–1.00)
GFR, Estimated: 41 mL/min — ABNORMAL LOW (ref >60)
Glucose, Bld: 140 mg/dL — ABNORMAL HIGH (ref 70–99)
Potassium: 4.6 mmol/L (ref 3.5–5.1)
Sodium: 139 mmol/L (ref 135–145)

## 2023-11-06 LAB — CBC
HCT: 38.8 % (ref 36.0–46.0)
Hemoglobin: 12.4 g/dL (ref 12.0–15.0)
MCH: 29.2 pg (ref 26.0–34.0)
MCHC: 32 g/dL (ref 30.0–36.0)
MCV: 91.5 fL (ref 80.0–100.0)
Platelets: 310 K/uL (ref 150–400)
RBC: 4.24 MIL/uL (ref 3.87–5.11)
RDW: 13.2 % (ref 11.5–15.5)
WBC: 8.3 K/uL (ref 4.0–10.5)
nRBC: 0 % (ref 0.0–0.2)

## 2023-11-06 LAB — GLUCOSE, CAPILLARY
Glucose-Capillary: 140 mg/dL — ABNORMAL HIGH (ref 70–99)
Glucose-Capillary: 128 mg/dL — ABNORMAL HIGH (ref 70–99)
Glucose-Capillary: 107 mg/dL — ABNORMAL HIGH (ref 70–99)
Glucose-Capillary: 110 mg/dL — ABNORMAL HIGH (ref 70–99)
Glucose-Capillary: 150 mg/dL — ABNORMAL HIGH (ref 70–99)

## 2023-11-06 LAB — TYPE AND SCREEN
ABO/RH(D): O POS
Antibody Screen: NEGATIVE

## 2023-11-06 SURGERY — SALPINGO-OOPHORECTOMY, UNILATERAL, LAPAROSCOPIC
Anesthesia: General | Site: Vagina

## 2023-11-06 MED ORDER — EPHEDRINE SULFATE-NACL 50-0.9 MG/10ML-% IV SOSY
PREFILLED_SYRINGE | INTRAVENOUS | Status: DC | PRN
  Administered 2023-11-06: 10 mg via INTRAVENOUS

## 2023-11-06 MED ORDER — HYDROCODONE-ACETAMINOPHEN 5-325 MG PO TABS: 1.0000 | ORAL_TABLET | Freq: Four times a day (QID) | ORAL | 0 refills | Status: DC | PRN

## 2023-11-06 MED ORDER — ROCURONIUM BROMIDE 10 MG/ML (PF) SYRINGE
PREFILLED_SYRINGE | INTRAVENOUS | Status: DC | PRN
  Administered 2023-11-06: 60 mg via INTRAVENOUS
  Administered 2023-11-06: 40 mg via INTRAVENOUS

## 2023-11-06 MED ORDER — ACETAMINOPHEN 500 MG PO TABS
1000.0000 mg | ORAL_TABLET | ORAL | Status: AC
  Administered 2023-11-06: 1000 mg via ORAL
  Filled 2023-11-06: qty 2

## 2023-11-06 MED ORDER — ONDANSETRON HCL 4 MG/2ML IJ SOLN
INTRAMUSCULAR | Status: DC | PRN
  Administered 2023-11-06: 4 mg via INTRAVENOUS

## 2023-11-06 MED ORDER — SODIUM CHLORIDE 0.9 % IV SOLN
2.0000 g | INTRAVENOUS | Status: AC
  Administered 2023-11-06: 2 g via INTRAVENOUS
  Filled 2023-11-06: qty 2

## 2023-11-06 MED ORDER — FENTANYL CITRATE (PF) 100 MCG/2ML IJ SOLN: 25.0000 ug | INTRAMUSCULAR | Status: DC | PRN

## 2023-11-06 MED ORDER — KETAMINE HCL 10 MG/ML IJ SOLN
INTRAMUSCULAR | Status: DC | PRN
  Administered 2023-11-06: 20 mg via INTRAVENOUS
  Administered 2023-11-06: 30 mg via INTRAVENOUS

## 2023-11-06 MED ORDER — SILVER NITRATE-POT NITRATE 75-25 % EX MISC
CUTANEOUS | Status: AC
  Filled 2023-11-06: qty 10

## 2023-11-06 MED ORDER — KETOROLAC TROMETHAMINE 30 MG/ML IJ SOLN
INTRAMUSCULAR | Status: AC
  Filled 2023-11-06: qty 1

## 2023-11-06 MED ORDER — PHENYLEPHRINE 80 MCG/ML (10ML) SYRINGE FOR IV PUSH (FOR BLOOD PRESSURE SUPPORT)
PREFILLED_SYRINGE | INTRAVENOUS | Status: AC
  Filled 2023-11-06: qty 10

## 2023-11-06 MED ORDER — POVIDONE-IODINE 10 % EX SWAB: 2.0000 | Freq: Once | CUTANEOUS | Status: DC

## 2023-11-06 MED ORDER — ONDANSETRON HCL 4 MG/2ML IJ SOLN
INTRAMUSCULAR | Status: AC
  Filled 2023-11-06: qty 2

## 2023-11-06 MED ORDER — ORAL CARE MOUTH RINSE: 15.0000 mL | Freq: Once | OROMUCOSAL | Status: AC

## 2023-11-06 MED ORDER — FENTANYL CITRATE (PF) 250 MCG/5ML IJ SOLN
INTRAMUSCULAR | Status: AC
  Filled 2023-11-06: qty 5

## 2023-11-06 MED ORDER — ALBUTEROL SULFATE HFA 108 (90 BASE) MCG/ACT IN AERS
INHALATION_SPRAY | RESPIRATORY_TRACT | Status: DC | PRN
Start: 2023-11-06 — End: 2023-11-06
  Administered 2023-11-06: 2 via RESPIRATORY_TRACT

## 2023-11-06 MED ORDER — ROCURONIUM BROMIDE 10 MG/ML (PF) SYRINGE
PREFILLED_SYRINGE | INTRAVENOUS | Status: AC
  Filled 2023-11-06: qty 10

## 2023-11-06 MED ORDER — SODIUM CHLORIDE (PF) 0.9 % IJ SOLN
INTRAMUSCULAR | Status: AC
  Filled 2023-11-06: qty 10

## 2023-11-06 MED ORDER — INSULIN ASPART 100 UNIT/ML IJ SOLN: 0.0000 [IU] | INTRAMUSCULAR | Status: DC | PRN

## 2023-11-06 MED ORDER — OXYCODONE HCL 5 MG/5ML PO SOLN: 5.0000 mg | Freq: Once | ORAL | Status: AC | PRN

## 2023-11-06 MED ORDER — LACTATED RINGERS IV SOLN: INTRAVENOUS | Status: DC

## 2023-11-06 MED ORDER — LIDOCAINE 2% (20 MG/ML) 5 ML SYRINGE
INTRAMUSCULAR | Status: DC | PRN
  Administered 2023-11-06: 60 mg via INTRAVENOUS

## 2023-11-06 MED ORDER — LIDOCAINE 2% (20 MG/ML) 5 ML SYRINGE
INTRAMUSCULAR | Status: AC
  Filled 2023-11-06: qty 5

## 2023-11-06 MED ORDER — OXYCODONE HCL 5 MG PO TABS
5.0000 mg | ORAL_TABLET | Freq: Once | ORAL | Status: AC | PRN
  Administered 2023-11-06: 5 mg via ORAL

## 2023-11-06 MED ORDER — BUPIVACAINE HCL (PF) 0.25 % IJ SOLN
INTRAMUSCULAR | Status: AC
  Filled 2023-11-06: qty 30

## 2023-11-06 MED ORDER — ONDANSETRON HCL 4 MG/2ML IJ SOLN: 4.0000 mg | Freq: Once | INTRAMUSCULAR | Status: DC | PRN

## 2023-11-06 MED ORDER — GLYCOPYRROLATE PF 0.2 MG/ML IJ SOSY
PREFILLED_SYRINGE | INTRAMUSCULAR | Status: AC
  Filled 2023-11-06: qty 1

## 2023-11-06 MED ORDER — PROPOFOL 10 MG/ML IV BOLUS
INTRAVENOUS | Status: DC | PRN
  Administered 2023-11-06: 160 mg via INTRAVENOUS

## 2023-11-06 MED ORDER — DEXAMETHASONE SODIUM PHOSPHATE 10 MG/ML IJ SOLN
INTRAMUSCULAR | Status: AC
  Filled 2023-11-06: qty 1

## 2023-11-06 MED ORDER — SODIUM CHLORIDE (PF) 0.9 % IJ SOLN
INTRAMUSCULAR | Status: DC | PRN
  Administered 2023-11-06: 10 mL

## 2023-11-06 MED ORDER — DIPHENHYDRAMINE HCL 50 MG/ML IJ SOLN
INTRAMUSCULAR | Status: AC
  Filled 2023-11-06: qty 1

## 2023-11-06 MED ORDER — MIDAZOLAM HCL 2 MG/2ML IJ SOLN
INTRAMUSCULAR | Status: DC | PRN
  Administered 2023-11-06 (×2): 1 mg via INTRAVENOUS

## 2023-11-06 MED ORDER — BUPIVACAINE HCL (PF) 0.25 % IJ SOLN
INTRAMUSCULAR | Status: DC | PRN
  Administered 2023-11-06: 9 mL

## 2023-11-06 MED ORDER — LIDOCAINE-EPINEPHRINE 1 %-1:100000 IJ SOLN
INTRAMUSCULAR | Status: AC
  Filled 2023-11-06: qty 1

## 2023-11-06 MED ORDER — DIPHENHYDRAMINE HCL 50 MG/ML IJ SOLN
INTRAMUSCULAR | Status: DC | PRN
  Administered 2023-11-06: 12.5 mg via INTRAVENOUS

## 2023-11-06 MED ORDER — FENTANYL CITRATE (PF) 250 MCG/5ML IJ SOLN
INTRAMUSCULAR | Status: DC | PRN
  Administered 2023-11-06: 50 ug via INTRAVENOUS
  Administered 2023-11-06: 100 ug via INTRAVENOUS

## 2023-11-06 MED ORDER — CHLORHEXIDINE GLUCONATE 0.12 % MT SOLN
15.0000 mL | Freq: Once | OROMUCOSAL | Status: AC
  Administered 2023-11-06: 15 mL via OROMUCOSAL
  Filled 2023-11-06: qty 15

## 2023-11-06 MED ORDER — SODIUM CHLORIDE 0.9 % IR SOLN
Status: DC | PRN
  Administered 2023-11-06: 3000 mL
  Administered 2023-11-06: 1000 mL

## 2023-11-06 MED ORDER — OXYCODONE HCL 5 MG PO TABS
ORAL_TABLET | ORAL | Status: AC
  Filled 2023-11-06: qty 1

## 2023-11-06 MED ORDER — KETAMINE HCL 50 MG/5ML IJ SOSY
PREFILLED_SYRINGE | INTRAMUSCULAR | Status: AC
  Filled 2023-11-06: qty 5

## 2023-11-06 MED ORDER — SUGAMMADEX SODIUM 200 MG/2ML IV SOLN: INTRAVENOUS | Status: DC | PRN

## 2023-11-06 MED ORDER — BUPIVACAINE HCL (PF) 0.5 % IJ SOLN
INTRAMUSCULAR | Status: AC
Start: 2023-11-06 — End: 2023-11-06
  Filled 2023-11-06: qty 30

## 2023-11-06 MED ORDER — MIDAZOLAM HCL 2 MG/2ML IJ SOLN
INTRAMUSCULAR | Status: AC
  Filled 2023-11-06: qty 2

## 2023-11-06 MED ORDER — PROPOFOL 500 MG/50ML IV EMUL
INTRAVENOUS | Status: DC | PRN
Start: 2023-11-06 — End: 2023-11-06
  Administered 2023-11-06: 150 ug/kg/min via INTRAVENOUS

## 2023-11-06 MED ORDER — SURGIFLO WITH THROMBIN (HEMOSTATIC MATRIX KIT) OPTIME
TOPICAL | Status: DC | PRN
Start: 2023-11-06 — End: 2023-11-06
  Administered 2023-11-06: 1 via TOPICAL

## 2023-11-06 MED ORDER — SUGAMMADEX SODIUM 200 MG/2ML IV SOLN
INTRAVENOUS | Status: DC | PRN
  Administered 2023-11-06: 200 mg via INTRAVENOUS

## 2023-11-06 SURGICAL SUPPLY — 47 items
APPLICATOR COTTON TIP 6 STRL (MISCELLANEOUS) IMPLANT
BAG COUNTER SPONGE SURGICOUNT (BAG) ×2 IMPLANT
CANISTER SUCTION 3000ML PPV (SUCTIONS) ×2 IMPLANT
CATH ROBINSON RED A/P 16FR (CATHETERS) ×2 IMPLANT
COVER MAYO STAND STRL (DRAPES) ×2 IMPLANT
DERMABOND ADVANCED .7 DNX12 (GAUZE/BANDAGES/DRESSINGS) ×2 IMPLANT
DEVICE MYOSURE LITE (MISCELLANEOUS) IMPLANT
DEVICE MYOSURE REACH (MISCELLANEOUS) IMPLANT
DILATOR CANAL MILEX (MISCELLANEOUS) IMPLANT
DRAPE SURG IRRIG POUCH 19X23 (DRAPES) ×2 IMPLANT
DURAPREP 26ML APPLICATOR (WOUND CARE) ×2 IMPLANT
GLOVE BIOGEL PI IND STRL 7.0 (GLOVE) ×8 IMPLANT
GLOVE ECLIPSE 6.5 STRL STRAW (GLOVE) ×2 IMPLANT
GLOVE SURG UNDER POLY LF SZ7 (GLOVE) ×4 IMPLANT
GOWN STRL REUS W/ TWL LRG LVL3 (GOWN DISPOSABLE) ×6 IMPLANT
IRRIGATION SUCT STRKRFLW 2 WTP (MISCELLANEOUS) IMPLANT
KIT PINK PAD W/HEAD ARM REST (MISCELLANEOUS) ×2 IMPLANT
KIT PROCEDURE FLUENT (KITS) ×2 IMPLANT
KIT TURNOVER KIT B (KITS) ×2 IMPLANT
LIGASURE VESSEL 5MM BLUNT TIP (ELECTROSURGICAL) IMPLANT
NDL INSUFFLATION 14GA 120MM (NEEDLE) ×2 IMPLANT
NDL SPNL 22GX3.5 QUINCKE BK (NEEDLE) IMPLANT
NEEDLE INSUFFLATION 14GA 120MM (NEEDLE) ×2 IMPLANT
NEEDLE SPNL 22GX3.5 QUINCKE BK (NEEDLE) ×2 IMPLANT
PACK LAPAROSCOPY BASIN (CUSTOM PROCEDURE TRAY) ×2 IMPLANT
PACK VAGINAL MINOR WOMEN LF (CUSTOM PROCEDURE TRAY) ×2 IMPLANT
PAD OB MATERNITY 11 LF (PERSONAL CARE ITEMS) ×2 IMPLANT
POUCH LAPAROSCOPIC INSTRUMENT (MISCELLANEOUS) ×2 IMPLANT
SEAL ROD LENS SCOPE MYOSURE (ABLATOR) ×2 IMPLANT
SET TUBE SMOKE EVAC HIGH FLOW (TUBING) IMPLANT
SHEARS HARMONIC 36 ACE (MISCELLANEOUS) IMPLANT
SLEEVE ADV FIXATION 5X100MM (TROCAR) IMPLANT
SURGIFLO W/THROMBIN 8M KIT (HEMOSTASIS) IMPLANT
SUT VIC AB 0 UR5 27 (SUTURE) IMPLANT
SUT VIC AB 4-0 PS2 18 (SUTURE) ×2 IMPLANT
SUT VICRYL 0 UR6 27IN ABS (SUTURE) IMPLANT
SYR 30ML LL (SYRINGE) IMPLANT
SYS ACCESS ABD 5X75MM KII FIOS (TROCAR) IMPLANT
SYSTEM BAG RETRIEVAL 10MM (BASKET) IMPLANT
SYSTEM CARTER THOMASON II (TROCAR) IMPLANT
TOWEL GREEN STERILE FF (TOWEL DISPOSABLE) ×4 IMPLANT
TRAY FOLEY W/BAG SLVR 14FR (SET/KITS/TRAYS/PACK) ×2 IMPLANT
TROCAR 11X100 Z THREAD (TROCAR) IMPLANT
TROCAR ADV FIXATION 5X100MM (TROCAR) ×2 IMPLANT
TROCAR XCEL NON-BLD 5MMX100MML (ENDOMECHANICALS) ×4 IMPLANT
UNDERPAD 30X36 HEAVY ABSORB (UNDERPADS AND DIAPERS) ×2 IMPLANT
WARMER LAPAROSCOPE (MISCELLANEOUS) ×2 IMPLANT

## 2023-11-06 NOTE — Op Note (Signed)
 11/06/2023  7:12 PM  PATIENT:  Brittany Mcclure  83 y.o. female  PRE-OPERATIVE DIAGNOSIS:  9 cm left adnexal mass Right probable hydrosalpinx Polyp Abnormal ultrasound of endometrium POST-OPERATIVE DIAGNOSIS:  Abnormal ultrasound of endometriumAdnexal massPolyp  PROCEDURE:  Procedure(s): SALPINGO-OOPHORECTOMY, BILATERAL, LAPAROSCOPIC DILATATION AND CURETTAGE /HYSTEROSCOPY  SURGEON:  [name]  ASSISTANTS: Bebe Furry, MD.  An experienced assistant was required given the standard of surgical care given the complexity of the case.  This assistant was needed for exposure, dissection, suctioning, retraction, instrument exchange and for overall help during the procedure.  RNFA help was also unavailable.  ANESTHESIA:   general  ESTIMATED BLOOD LOSS: 5 mL  BLOOD ADMINISTERED:none   FLUIDS: 1000cc LR  UOP: 300cc clear UOP  SPECIMEN:  bilateral ovaries and fallopian tubes, endometrial polyp, cystologic washings  DISPOSITION OF SPECIMEN:  PATHOLOGY  FINDINGS: cystic lesion of left and right fallopian tubes, normal appearing ovaries.  Diverticulosis of left colon.  With hysteroscopy, two endometrial polyps were noted  DESCRIPTION OF OPERATION: Patient is taken to the operating room. She is placed in the supine position. She is a running IV in place. Informed consent was present on the chart. SCDs on her lower extremities and functioning properly. Patient was positioned while she was awake.  Her legs were placed in the low lithotomy position in [place] stirrups. Her arms were tucked by the side.  General endotracheal anesthesia was administered by the anesthesia staff without difficulty. Dr. Corinne, anesthesia, oversaw case.  Time out performed.    Dura prep was then used to prep the abdomen and Betadine  was used to prep the inner thighs, perineum and vagina. Once 3 minutes had past the patient was draped in a normal standard fashion. The legs were lifted to the high lithotomy position.  The cervix was visualized by placing a heavy weighted speculum in the posterior aspect of the vagina and using a curved Deaver retractor to the retract anteriorly. The anterior lip of the cervix was grasped with single-tooth tenaculum.  The cervix sounded to 7 cm.  A hulka clamp was passed through the cervical os and attached to the anterior lip of the cervix.  The tenaculum was removed.  The speculum was removed.  A Foley catheter was placed to straight drain.  Clear urine was noted. Legs were lowered to the low lithotomy position and attention was turned the abdomen.  The umbilicus was everted.  Marcaine  0.25% used to anesthetize the skin.  Using #11 blade, 5mm skin incision was made.  A Veress needle was obtained. Syringe of sterile saline was placed on a open Veress needle.  With the abdomen elevated, the Veress needle was passed into the umbilicus until the pop was heard and then fluid started to drip.  Then low flow CO2 gas was attached the needle but high pressures present so the Veress needles was removed.  The umbilical incision was lengthened and decision made to use open technique.  Fascia was identified and grasped with Kocher clamps.  Fascia was incised with Metzembaum scissors and the peritoneum was easily opened with hemostat.  Phil was passed through the incision, bulb inflated and trochar removed.  Intraperitoneal placement was confirmed.  Pneumoperitoneum was achieved without difficulty and co2 gas placed on high flow.  Pt was placed in Trendelenburg positioning.   Locations for RLQ and LLQ ports were identified transillumination of the abdominal wall.  0.25% marcaine  was used to anesthetize the skin.  5mm skin incision was made in the RLQ  and 5mm port was placed underdirect visualization of the laparoscope.  Then a 10mm skin incision was made and a 10mm nonbladed trochar and port was placed in the LLQ.  All trochars were removed.  Cytologic washings obtained.    Pt had diverticulosis of the  left colon.  Ureters were identifies.  Adnexal cystic lesion was noted to be the fallopian tube.  It was adhered to the side wall.  The peritoneum on the side wall was opened with Harmonic scalpel.  Incision was above the ovary and IP.  It was extended  and the IP was isolated.  The IP ligament was cauterized and incised with the Ligasure device.  The specimen was retracted away from the side wall and the peritoneaum was then incised where there was adequate visability to free the fallopian tube and ovary.  Then the mesosalpinx was clamped, cauterized and incised and the uteroovarian pedicle was incised close to the uterus, free the specimen.  It was then delivered through the left port.    Attention was turned to the right side. With uterus on stretch the right IP ligament was serially clamped, cauterized and incised.  The ureter was well below this dissection.  Then the mesosalpinx was clamped, cauterized and incised.  The uteroovarian pedicle was then cauterized and incised, freeing the specimen.  This was delivered through the left port as well.    Pedicles were irrigated.  CO2 pressures were lowered to 8mm Hg.  As pt is on baby ASA and will restart her Plavix , surgiflow was placed along the pedicle sites.  This portion of the procedure was completed.  The LLQ incision was closed at the fascial level as was the umbilical incision.  Pneumoperitoneum was relieved.  Pt was given several deep breaths to try and removal all co2 gas from the abdomen prior to removal of the RLQ port.  Incisions were then closed with #4.0 Vicryl with subcuticular stitch.    Attention was then turned to the vaginal.  Legs were elevated to the high lithotomy position.  The Hulka clamp was removed.  A single toothed tenaculum was placed on the anterior lip of the cervix.  The cervix is dilated up to #17 [facility] dilators. The 5.32mm myosure hysteroscope was obtained.   Normal saline was used as a hysteroscopic fluid. The hysteroscope was  advanced through the endocervical canal into the endometrial cavity. The tubal ostia were noted bilaterally. Additional findings included two polyps.  Using the [facility] lite device, the polyps were removed completely.  Photodocumentation was obtained.  The endometrium was thin so no D&C was performed.  At this point, the procedure was ended.  The hysteroscope was removed. The fluid deficit was 40 cc. The tenaculum was removed from the anterior lip of the cervix. The speculum was removed from the vagina. The prep was cleansed of the patient's skin. The legs are positioned back in the supine position. Sponge, lap, needle, instrument counts were correct x2. Patient was taken to recovery in stable condition.  COUNTS:  YES  PLAN OF CARE: Transfer to PACU

## 2023-11-06 NOTE — Anesthesia Procedure Notes (Signed)
 Procedure Name: Intubation Date/Time: 11/06/2023 4:23 PM  Performed by: Mollie Olivia SAUNDERS, CRNAPre-anesthesia Checklist: Patient identified, Emergency Drugs available, Suction available and Patient being monitored Patient Re-evaluated:Patient Re-evaluated prior to induction Oxygen Delivery Method: Circle system utilized Preoxygenation: Pre-oxygenation with 100% oxygen Induction Type: IV induction Ventilation: Mask ventilation without difficulty Laryngoscope Size: Mac and 3 Grade View: Grade III Tube type: Oral Tube size: 7.0 mm Number of attempts: 2 Airway Equipment and Method: Stylet and Oral airway Placement Confirmation: ETT inserted through vocal cords under direct vision, positive ETCO2 and breath sounds checked- equal and bilateral Secured at: 23 cm Tube secured with: Tape Dental Injury: Teeth and Oropharynx as per pre-operative assessment  Difficulty Due To: Difficulty was anticipated, Difficult Airway- due to anterior larynx and Difficult Airway- due to immobile epiglottis Future Recommendations: Recommend- induction with short-acting agent, and alternative techniques readily available Comments: Cannot visualize anything more than the tip of the arytenoids; anterior

## 2023-11-06 NOTE — H&P (Signed)
 Brittany Mcclure is an 83 y.o. female with 9cm left ovarian cyst here for laparoscopic BSO as well as hysteroscopy, possible polyp resection, D&C.  Pt underwent CT of the abdomen and pelvic for LLQ and epigastric pain.  The adnexal mass was noted then as well as thickened endometrium.  Ca-125 was normal. Endometrial biopsy showed polypoid endometrium consistent with endometrial polyp.  Initially, she desired conservative management and repeat ultrasound was scheduled for 4 months.  Ovarian cyst was a little bit smaller but possible right salpingectomy was noted.  She decided at this point to proceed with surgery.  Cardiac clearance was requested.  Due to dyspnea and chest pain, she underwent and echocardiogram, coronary CT, and cardiac catheterization.  She was started on Plavix  and does have moderate to sever but not critical single vessle CAD with 70-80% st3enosis in the RCA.  She was started on plavix  and cleared for surgery.    Procedure reviewed with pt.  She stopped her plavix  more than 5 days ago but her cardiologist did want her to start on a baby ASA.  She has been taking this.  Risks reviewed.  Pt ready to proceed.    Pertinent Gynecological History: Menses: post-menopausal Bleeding: none Contraception: PMP DES exposure: denies Blood transfusions: none Sexually transmitted diseases: no past history Previous GYN Procedures: BTL, D&C  Last mammogram: normal Date: 05/31/2023 Last pap: not indicated OB History: G2, P2   Menstrual History: No LMP recorded. Patient is postmenopausal.    Past Medical History:  Diagnosis Date   Allergy    Anxiety    Arthritis    with gout   Carotid bruit    right   Cataract    bil cataracts removed   Chest pain    Chronic kidney disease    Colon polyps    Depression    Diverticulosis    Duodenitis    Exogenous obesity    Gastric ulcer    GERD (gastroesophageal reflux disease)    Hyperlipidemia    Hypertension    Metabolic syndrome     Osteopenia    Seasonal allergies    seasonal   Tubular adenoma of colon    Type 2 diabetes mellitus (HCC)     Past Surgical History:  Procedure Laterality Date   COLONOSCOPY     11/90,11/91,1994,2000,2010   CORONARY PRESSURE/FFR STUDY N/A 08/31/2023   Procedure: CORONARY PRESSURE/FFR STUDY;  Surgeon: Mady Bruckner, MD;  Location: [facility];  Service: Cardiovascular;  Laterality: N/A;   DILATION AND CURETTAGE OF UTERUS     DILATION AND CURETTAGE OF UTERUS  1997   FOOT SURGERY     x 2   HAND SURGERY Right    Carpel Tunnel   LEFT HEART CATH AND CORONARY ANGIOGRAPHY N/A 08/31/2023   Procedure: LEFT HEART CATH AND CORONARY ANGIOGRAPHY;  Surgeon: Mady Bruckner, MD;  Location: [facility];  Service: Cardiovascular;  Laterality: N/A;   POLYPECTOMY     TUBAL LIGATION      Family History  Problem Relation Age of Onset   Colon cancer Mother    Stroke Father    Hypertension Father    Heart failure Maternal Grandmother    Diabetes Maternal Grandmother        ?   Rectal cancer Neg Hx    Stomach cancer Neg Hx    Esophageal cancer Neg Hx    Pancreatic cancer Neg Hx     Social History:  reports that she has quit  smoking. She has never been exposed to tobacco smoke. She has never used smokeless tobacco. She reports current alcohol use. She reports that she does not use drugs.  Allergies:  Allergies  Allergen Reactions   Crestor [Rosuvastatin Calcium]     Myalgias    Iodine      Skin rash   Lipitor [Atorvastatin Calcium]     Myalgias    Lopid  [Gemfibrozil ] Itching   Lovastatin     myalgias   Sulfa Antibiotics Nausea And Vomiting    Made infection worse    Sulfonamide Derivatives     REACTION: n\T\v   Zetia  [Ezetimibe ]     Cough, dry    Medications Prior to Admission  Medication Sig Dispense Refill Last Dose/Taking   acetaminophen  (TYLENOL ) 500 MG tablet Take 500 mg by mouth every morning.   Past Week   acetaminophen  (TYLENOL ) 650 MG CR tablet Take 650  mg by mouth at bedtime.   Past Week   ALPRAZolam  (XANAX ) 0.25 MG tablet TAKE ONE TABLET BY MOUTH TWICE DAILY AS NEEDED (Patient taking differently: Take 0.25 mg by mouth daily.) 60 tablet 1 11/06/2023   amLODipine  (NORVASC ) 2.5 MG tablet Take 1 tablet (2.5 mg total) by mouth daily. 30 tablet 5 11/06/2023 at  8:00 AM   aspirin  EC 81 MG tablet Take 1 tablet (81 mg total) by mouth daily. Swallow whole.   11/06/2023 at  8:00 AM   Calcium Carb-Cholecalciferol (CALCIUM 600/VITAMIN D3 PO) Take 1 tablet by mouth at bedtime.   Past Week   Cholecalciferol (VITAMIN D3) 125 MCG (5000 UT) TABS Take 5,000 Units by mouth daily.   Past Week   clopidogrel  (PLAVIX ) 75 MG tablet Take 1 tablet (75 mg total) by mouth daily. 90 tablet 3 10/31/2023   esomeprazole  (NEXIUM ) 40 MG capsule Take 1 capsule (40 mg total) by mouth daily. 90 capsule 3 11/05/2023   FLUoxetine  (PROZAC ) 40 MG capsule TAKE ONE CAPSULE BY MOUTH EVERY DAY. 90 capsule 1 11/06/2023 at  8:00 AM   glipiZIDE  (GLUCOTROL ) 5 MG tablet Take 1 tablet (5 mg total) by mouth 2 (two) times daily before a meal. 180 tablet 1 11/05/2023   Glycerin-Hypromellose-PEG 400 (DRY EYE RELIEF DROPS OP) Place 1 drop into both eyes daily as needed (Dry eye).   Unknown   Multiple Vitamins-Minerals (PRESERVISION AREDS PO) Take 1 tablet by mouth in the morning and at bedtime.   Past Week   Omega-3 Fatty Acids (FISH OIL) 1000 MG CAPS Take 1,000 mg by mouth daily.   Past Week   triamcinolone  cream (KENALOG ) 0.1 % Apply 1 Application topically 2 (two) times daily. Apply 2 times daily to the back for up to 2 weeks then STOP, Resume in 2 weeks if irritation is not resolved (Patient taking differently: Apply 1 Application topically as needed (skin irritation). Apply 2 times daily to the back for up to 2 weeks then STOP, Resume in 2 weeks if irritation is not resolved) 453.6 g 2 Taking Differently   Accu-Chek FastClix Lancets MISC USE AS DIRECTED. 200 each 2    glucose blood (ACCU-CHEK SMARTVIEW)  test strip CHECK BLOOD SUGAR 3 TIMES DAILY AS DIRECTED. 200 strip 2    sucralfate  (CARAFATE ) 1 g tablet Take 1 tablet (1 g total) by mouth 3 (three) times daily as needed. 90 tablet 5    telmisartan  (MICARDIS ) 40 MG tablet Take 1 tablet (40 mg total) by mouth daily. 90 tablet 3 Not Taking    Review of Systems  Blood  pressure (!) 179/79, pulse 89, temperature 97.9 F (36.6 C), temperature source Oral, resp. rate 19, height 5' 1 (1.549 m), weight 90.7 kg, SpO2 95%. Physical Exam Constitutional:      Appearance: Normal appearance.  Cardiovascular:     Rate and Rhythm: Normal rate and regular rhythm.  Pulmonary:     Effort: Pulmonary effort is normal.     Breath sounds: Normal breath sounds.  Neurological:     General: No focal deficit present.     Mental Status: She is alert.  Psychiatric:        Mood and Affect: Mood normal.        Behavior: Behavior normal.     Results for orders placed or performed during the hospital encounter of 11/06/23 (from the past 24 hours)  Basic metabolic panel     Status: Abnormal   Collection Time: 11/06/23  9:48 AM  Result Value Ref Range   Sodium 139 135 - 145 mmol/L   Potassium 4.6 3.5 - 5.1 mmol/L   Chloride 104 98 - 111 mmol/L   CO2 22 22 - 32 mmol/L   Glucose, Bld 140 (H) 70 - 99 mg/dL   BUN 25 (H) 8 - 23 mg/dL   Creatinine, Ser 8.72 (H) 0.44 - 1.00 mg/dL   Calcium 9.2 8.9 - 89.6 mg/dL   GFR, Estimated 41 (L) >60 mL/min   Anion gap 13 5 - 15  CBC     Status: None   Collection Time: 11/06/23  9:48 AM  Result Value Ref Range   WBC 8.3 4.0 - 10.5 K/uL   RBC 4.24 3.87 - 5.11 MIL/uL   Hemoglobin 12.4 12.0 - 15.0 g/dL   HCT 61.1 63.9 - 53.9 %   MCV 91.5 80.0 - 100.0 fL   MCH 29.2 26.0 - 34.0 pg   MCHC 32.0 30.0 - 36.0 g/dL   RDW 86.7 88.4 - 84.4 %   Platelets 310 150 - 400 K/uL   nRBC 0.0 0.0 - 0.2 %  Glucose, capillary     Status: Abnormal   Collection Time: 11/06/23 10:01 AM  Result Value Ref Range   Glucose-Capillary 140 (H) 70 -  99 mg/dL  Type and screen     Status: None   Collection Time: 11/06/23 10:15 AM  Result Value Ref Range   ABO/RH(D) O POS    Antibody Screen NEG    Sample Expiration      11/09/2023,2359 Performed at [facility], [address] [place], [place] [address]   ABO/Rh     Status: None   Collection Time: 11/06/23 10:20 AM  Result Value Ref Range   ABO/RH(D)      O POS Performed at [facility], [address] [place], [place] [address]   Glucose, capillary     Status: Abnormal   Collection Time: 11/06/23 12:14 PM  Result Value Ref Range   Glucose-Capillary 128 (H) 70 - 99 mg/dL   Comment 1 Notify RN    Comment 2 Document in Chart   Glucose, capillary     Status: Abnormal   Collection Time: 11/06/23  2:08 PM  Result Value Ref Range   Glucose-Capillary 107 (H) 70 - 99 mg/dL   Comment 1 Notify RN    Comment 2 Document in Chart     No results found.  Assessment/Plan: 83 yo G2P2 WF with 9cm left adnexal cystic mass here for laparoscopic resection, possible RSO.  Also, she has thickened endometrium.  Hysteroscopy with possible polyp resection, D&C planned as well.  Questions answered.  Pt ready to proceed.  [name] 11/06/2023, 3:31 PM

## 2023-11-06 NOTE — Discharge Instructions (Signed)
 Post-surgical Instructions, Outpatient Surgery  You may expect to feel dizzy, weak, and drowsy for as long as 24 hours after receiving the medicine that made you sleep (anesthetic). For the first 24 hours after your surgery:   Do not drive a car, ride a bicycle, participate in physical activities, or take public transportation until you are done taking narcotic pain medicines or as directed by Dr. Cleotilde.  Do not drink alcohol or take tranquilizers.  Do not take medicine that has not been prescribed by your physicians.  Do not sign important papers or make important decisions while on narcotic pain medicines.  Have a responsible person with you.   CARE OF INCISION If you have a bandage, you may remove it in one day.  If there are steri-strips or dermabond, just let this loosen on its own.  You may shower on the first day after your surgery.  Do not sit in a tub bath for one week. Avoid heavy lifting (more than 10 pounds/4.5 kilograms), pushing, or pulling.  Avoid activities that may risk injury to your incisions.   PAIN MANAGEMENT If you are having mild pain, you can take two tylenol  tablets every 6 hours.  If you take the narcotic pain medication, do not take Tylenol  for at least 6 hours.   Vicodin 5/325mg .  For more severe pain, take one or two tablets every four to six hours as needed for pain control.  (Remember that narcotic pain medications increase your risk of constipation.  If this becomes a problem, you may take an over the counter stool softener like Colace 100mg  up to four times a day.)  DO'S AND DON'T'S Do not take a tub bath for one week.  You may shower on the first day after your surgery Do not do any heavy lifting for one to two weeks.  This increases the chance of bleeding. Do move around as you feel able.  Stairs are fine.  You may begin to exercise again as you feel able.  Do not lift any weights for two weeks. Do not put anything in the vagina for two weeks--no tampons,  intercourse, or douching.    REGULAR MEDIATIONS/VITAMINS: You may restart all of your regular medications as prescribed. You may restart all of your vitamins as you normally take them.    PLEASE CALL OR SEEK MEDICAL CARE IF: You have persistent nausea and vomiting.  You have trouble eating or drinking.  You have an oral temperature above 100.5.  You have constipation that is not helped by adjusting diet or increasing fluid intake. Pain medicines are a common cause of constipation.  You have heavy vaginal bleeding You have redness or drainage from your incision(s) or there is increasing pain or tenderness near or in the surgical site.

## 2023-11-06 NOTE — Transfer of Care (Signed)
 Immediate Anesthesia Transfer of Care Note  Patient: Brittany Mcclure  Procedure(s) Performed: SALPINGO-OOPHORECTOMY, BILATERAL, LAPAROSCOPIC (Left: Abdomen) DILATATION AND CURETTAGE /HYSTEROSCOPY (Vagina )  Patient Location: PACU  Anesthesia Type:General  Level of Consciousness: awake, sedated, and drowsy  Airway & Oxygen Therapy: Patient Spontanous Breathing and Patient connected to face mask oxygen  Post-op Assessment: Report given to RN, Post -op Vital signs reviewed and stable, and Patient moving all extremities X 4  Post vital signs: Reviewed and stable  Last Vitals:  Vitals Value Taken Time  BP 145/60 11/06/23 18:45  Temp 36.5 C 11/06/23 18:45  Pulse 79 11/06/23 18:51  Resp 18 11/06/23 18:51  SpO2 95 % 11/06/23 18:51  Vitals shown include unfiled device data.  Last Pain:  Vitals:   11/06/23 1845  TempSrc:   PainSc: Asleep         Complications: No notable events documented.

## 2023-11-06 NOTE — Anesthesia Postprocedure Evaluation (Signed)
 Anesthesia Post Note  Patient: [name]  Procedure(s) Performed: SALPINGO-OOPHORECTOMY, BILATERAL, LAPAROSCOPIC (Left: Abdomen) DILATATION AND CURETTAGE /HYSTEROSCOPY (Vagina )     Patient location during evaluation: PACU Anesthesia Type: General Level of consciousness: awake and alert Pain management: pain level controlled Vital Signs Assessment: post-procedure vital signs reviewed and stable Respiratory status: spontaneous breathing, nonlabored ventilation and respiratory function stable Cardiovascular status: blood pressure returned to baseline and stable Postop Assessment: no apparent nausea or vomiting Anesthetic complications: no   No notable events documented.  Last Vitals:  Vitals:   11/06/23 1915 11/06/23 1930  BP: (!) 136/58 138/62  Pulse: 78 79  Resp: 18 16  Temp:  36.5 C  SpO2: 93% 93%    Last Pain:  Vitals:   11/06/23 1915  TempSrc:   PainSc: 0-No pain                 Garnette FORBES Skillern

## 2023-11-07 ENCOUNTER — Encounter (HOSPITAL_COMMUNITY): Payer: Self-pay | Admitting: Obstetrics & Gynecology

## 2023-11-07 ENCOUNTER — Telehealth (HOSPITAL_BASED_OUTPATIENT_CLINIC_OR_DEPARTMENT_OTHER): Payer: Self-pay

## 2023-11-07 NOTE — Telephone Encounter (Signed)
 Spoke with patient's daughter Reena as the number for the patient was not going through. Reena states that the patient is with her. 1 month post op scheduled for 12/07/23 at 1:35 pm with Dr.Miller. Patient is agreeable to date and time.

## 2023-11-07 NOTE — Telephone Encounter (Signed)
-----   Message from [name] sent at 11/06/2023  7:33 PM EDT ----- Regarding: post op appt Brittany Mcclure, Can you call this pt and schedule a 1 month post op appt?  Thanks.  [name]

## 2023-11-08 LAB — SURGICAL PATHOLOGY

## 2023-11-08 LAB — CYTOLOGY - NON PAP

## 2023-11-09 ENCOUNTER — Encounter: Admitting: Nurse Practitioner

## 2023-11-09 ENCOUNTER — Telehealth (HOSPITAL_BASED_OUTPATIENT_CLINIC_OR_DEPARTMENT_OTHER): Payer: Self-pay

## 2023-11-09 NOTE — Telephone Encounter (Signed)
 Patient called office today wanting to know if you have any results from the biopsies that were obtained from her surgery performed on Monday. She also would like for you to refill her pain medication.

## 2023-11-10 ENCOUNTER — Other Ambulatory Visit (HOSPITAL_BASED_OUTPATIENT_CLINIC_OR_DEPARTMENT_OTHER): Payer: Self-pay | Admitting: Obstetrics & Gynecology

## 2023-11-10 DIAGNOSIS — G8918 Other acute postprocedural pain: Secondary | ICD-10-CM

## 2023-11-10 MED ORDER — HYDROCODONE-ACETAMINOPHEN 5-325 MG PO TABS: 1.0000 | ORAL_TABLET | Freq: Four times a day (QID) | ORAL | 0 refills | Status: DC | PRN

## 2023-11-10 NOTE — Telephone Encounter (Signed)
 Spoke with patient. Patient states that she is having intermittent pain on her left side close to her incision. Pain is a 4-5/10 when she standing from a sitting position. Pain is relieved when she places pressure over the incision. Took her last 2 hydrocodone -acetaminophen  5-325 mg tablets last night and slept well without pain. Reports she feels she does not need additional pain medication at this time. Asking if she can take acetaminophen  since she is on plavix . Advised okay to take 2 tylenol  regular strength every 4 hours or 2 extra strength every 6 hours.  Advised will review with Dr.Miller. If additional medication is indicated after review patient would like to use [facility].

## 2023-11-10 NOTE — Telephone Encounter (Signed)
 Called patient x3 at [phone] phone line busy.   Called patient's daughter, Reena, left message to call [place] Jenifer at [phone].

## 2023-11-10 NOTE — Telephone Encounter (Signed)
 Attempted to reach patient x 3 at [phone] and line was busy. Spoke with patient's daughter, Reena, okay per ROI. Advised Dr.Miller has sent in a refill for hydrocodone -acetaminophen  5-325 mg take 1-2 tablets every 6 hours PRN #[id number] sent to [facility] in case the patient needs it over the weekend. Reena verbalizes understanding and will the patient know.

## 2023-11-16 ENCOUNTER — Ambulatory Visit (HOSPITAL_BASED_OUTPATIENT_CLINIC_OR_DEPARTMENT_OTHER): Payer: Self-pay | Admitting: Obstetrics & Gynecology

## 2023-12-07 ENCOUNTER — Encounter (HOSPITAL_BASED_OUTPATIENT_CLINIC_OR_DEPARTMENT_OTHER): Admitting: Obstetrics & Gynecology

## 2023-12-12 ENCOUNTER — Encounter (HOSPITAL_BASED_OUTPATIENT_CLINIC_OR_DEPARTMENT_OTHER): Admitting: Obstetrics & Gynecology

## 2023-12-12 ENCOUNTER — Telehealth (HOSPITAL_BASED_OUTPATIENT_CLINIC_OR_DEPARTMENT_OTHER): Payer: Self-pay

## 2023-12-12 NOTE — Progress Notes (Deleted)
 GYNECOLOGY  VISIT  CC:   post op recheck  HPI: 83 y.o. G2P2 Widowed White or Caucasian female here for recheck after undergoing Bilateral Salpingo-Oopherectomy on 11/06/2023.  She reports bleeding is {None/Minimal:21241}.  She has {No/  **:31982} pain.  Bowel function is {Normal/Abnormal:[id number]}.  Bladder function is normal.    Pathology reviewed:  {YES/NO:21197}.  Questions answered.    MEDS:   Current Outpatient Medications on File Prior to Visit  Medication Sig Dispense Refill   Accu-Chek FastClix Lancets MISC USE AS DIRECTED. 200 each 2   acetaminophen  (TYLENOL ) 500 MG tablet Take 500 mg by mouth every morning.     ALPRAZolam  (XANAX ) 0.25 MG tablet TAKE ONE TABLET BY MOUTH TWICE DAILY AS NEEDED (Patient taking differently: Take 0.25 mg by mouth daily.) 60 tablet 1   amLODipine  (NORVASC ) 2.5 MG tablet Take 1 tablet (2.5 mg total) by mouth daily. 30 tablet 5   aspirin  EC 81 MG tablet Take 1 tablet (81 mg total) by mouth daily. Swallow whole.     Calcium Carb-Cholecalciferol (CALCIUM 600/VITAMIN D3 PO) Take 1 tablet by mouth at bedtime.     Cholecalciferol (VITAMIN D3) 125 MCG (5000 UT) TABS Take 5,000 Units by mouth daily.     clopidogrel  (PLAVIX ) 75 MG tablet Take 1 tablet (75 mg total) by mouth daily. 90 tablet 3   esomeprazole  (NEXIUM ) 40 MG capsule Take 1 capsule (40 mg total) by mouth daily. 90 capsule 3   FLUoxetine  (PROZAC ) 40 MG capsule TAKE ONE CAPSULE BY MOUTH EVERY DAY. 90 capsule 1   glipiZIDE  (GLUCOTROL ) 5 MG tablet Take 1 tablet (5 mg total) by mouth 2 (two) times daily before a meal. 180 tablet 1   glucose blood (ACCU-CHEK SMARTVIEW) test strip CHECK BLOOD SUGAR 3 TIMES DAILY AS DIRECTED. 200 strip 2   Glycerin-Hypromellose-PEG 400 (DRY EYE RELIEF DROPS OP) Place 1 drop into both eyes daily as needed (Dry eye).     HYDROcodone -acetaminophen  (NORCO/VICODIN) 5-325 MG tablet Take 1-2 tablets by mouth every 6 (six) hours as needed for moderate pain (pain score 4-6). 10 tablet  0   Multiple Vitamins-Minerals (PRESERVISION AREDS PO) Take 1 tablet by mouth in the morning and at bedtime.     Omega-3 Fatty Acids (FISH OIL) 1000 MG CAPS Take 1,000 mg by mouth daily.     sucralfate  (CARAFATE ) 1 g tablet Take 1 tablet (1 g total) by mouth 3 (three) times daily as needed. 90 tablet 5   telmisartan  (MICARDIS ) 40 MG tablet Take 1 tablet (40 mg total) by mouth daily. 90 tablet 3   triamcinolone  cream (KENALOG ) 0.1 % Apply 1 Application topically 2 (two) times daily. Apply 2 times daily to the back for up to 2 weeks then STOP, Resume in 2 weeks if irritation is not resolved (Patient taking differently: Apply 1 Application topically as needed (skin irritation). Apply 2 times daily to the back for up to 2 weeks then STOP, Resume in 2 weeks if irritation is not resolved) 453.6 g 2   No current facility-administered medications on file prior to visit.    SH:  Smoking {YES/NO:21197}   PHYSICAL EXAMINATION:    There were no vitals taken for this visit.    General appearance: alert, cooperative and appears stated age CV:  {Exam; heart brief:31539} Lungs:  {pe lungs ob:314451} Abdomen: soft, non-tender; bowel sounds normal; no masses,  no organomegaly Incisions:  C/D/I  Pelvic: External genitalia:  no lesions  Urethra:  normal appearing urethra with no masses, tenderness or lesions              Bartholins and Skenes: normal                 Vagina: normal appearing vagina with normal color and discharge, no lesions              Cervix: {CHL AMB PHY EX CERVIX NORM DEFAULT:[phone]::no lesions}              Bimanual Exam:  Uterus:  {CHL AMB PHY EX UTERUS NORM DEFAULT:[phone]::normal size, contour, position, consistency, mobility, non-tender}              Adnexa: {CHL AMB PHY EX ADNEXA NO MASS DEFAULT:[phone]::no mass, fullness, tenderness}  Chaperone, ***, was present for exam.  Assessment/Plan:

## 2023-12-12 NOTE — Telephone Encounter (Signed)
 Spoke with patient. Patient had a post op appointment scheduled for today 9/23 at 1:55 pm with Dr.Miller. Patient did not show for appointment. Patient reports that she is going well without and questions or concerns. Per Dr.Miller okay to not reschedule. Patient will call with any questions or concerns.

## 2023-12-18 ENCOUNTER — Telehealth (HOSPITAL_BASED_OUTPATIENT_CLINIC_OR_DEPARTMENT_OTHER): Payer: Self-pay

## 2023-12-18 NOTE — Telephone Encounter (Signed)
 Spoke with patient, she states that she went to [employer] yesterday and lifted a couple of heavy things. She states that when she woke up this morning she noticed that there was blood around her navel and on the waistband of her underwear. She reports that she also noticed blood around her navel. She put a bandaid on it this morning and there has been no more blood since this morning. She denies pain, swelling, redness or oozing. Patient instructed to keep an eye on it and if symptoms develop or worsen to call office for possible evaluation.  Patient verbalizes understanding with no further questions or concerns.   Morna LOISE Quale, RN

## 2024-01-02 ENCOUNTER — Emergency Department (HOSPITAL_COMMUNITY)

## 2024-01-02 ENCOUNTER — Inpatient Hospital Stay (HOSPITAL_COMMUNITY): Admission: EM | Admit: 2024-01-02 | Discharge: 2024-01-06 | DRG: 378 | Attending: Student | Admitting: Student

## 2024-01-02 ENCOUNTER — Encounter (HOSPITAL_COMMUNITY): Payer: Self-pay

## 2024-01-02 ENCOUNTER — Inpatient Hospital Stay (HOSPITAL_COMMUNITY)

## 2024-01-02 ENCOUNTER — Other Ambulatory Visit: Payer: Self-pay

## 2024-01-02 DIAGNOSIS — Z8249 Family history of ischemic heart disease and other diseases of the circulatory system: Secondary | ICD-10-CM | POA: Diagnosis not present

## 2024-01-02 DIAGNOSIS — Z6838 Body mass index (BMI) 38.0-38.9, adult: Secondary | ICD-10-CM | POA: Diagnosis not present

## 2024-01-02 DIAGNOSIS — Z91041 Radiographic dye allergy status: Secondary | ICD-10-CM

## 2024-01-02 DIAGNOSIS — K921 Melena: Secondary | ICD-10-CM | POA: Diagnosis not present

## 2024-01-02 DIAGNOSIS — Z860101 Personal history of adenomatous and serrated colon polyps: Secondary | ICD-10-CM | POA: Diagnosis not present

## 2024-01-02 DIAGNOSIS — K644 Residual hemorrhoidal skin tags: Secondary | ICD-10-CM | POA: Diagnosis present

## 2024-01-02 DIAGNOSIS — I2510 Atherosclerotic heart disease of native coronary artery without angina pectoris: Secondary | ICD-10-CM | POA: Diagnosis not present

## 2024-01-02 DIAGNOSIS — K5791 Diverticulosis of intestine, part unspecified, without perforation or abscess with bleeding: Secondary | ICD-10-CM

## 2024-01-02 DIAGNOSIS — I129 Hypertensive chronic kidney disease with stage 1 through stage 4 chronic kidney disease, or unspecified chronic kidney disease: Secondary | ICD-10-CM | POA: Diagnosis present

## 2024-01-02 DIAGNOSIS — Z66 Do not resuscitate: Secondary | ICD-10-CM | POA: Diagnosis not present

## 2024-01-02 DIAGNOSIS — K648 Other hemorrhoids: Secondary | ICD-10-CM | POA: Diagnosis present

## 2024-01-02 DIAGNOSIS — K449 Diaphragmatic hernia without obstruction or gangrene: Secondary | ICD-10-CM | POA: Diagnosis present

## 2024-01-02 DIAGNOSIS — K6389 Other specified diseases of intestine: Secondary | ICD-10-CM | POA: Diagnosis not present

## 2024-01-02 DIAGNOSIS — F39 Unspecified mood [affective] disorder: Secondary | ICD-10-CM | POA: Diagnosis present

## 2024-01-02 DIAGNOSIS — K922 Gastrointestinal hemorrhage, unspecified: Principal | ICD-10-CM | POA: Diagnosis present

## 2024-01-02 DIAGNOSIS — E66812 Obesity, class 2: Secondary | ICD-10-CM | POA: Diagnosis present

## 2024-01-02 DIAGNOSIS — D62 Acute posthemorrhagic anemia: Secondary | ICD-10-CM | POA: Diagnosis not present

## 2024-01-02 DIAGNOSIS — Z7982 Long term (current) use of aspirin: Secondary | ICD-10-CM

## 2024-01-02 DIAGNOSIS — Z860100 Personal history of colon polyps, unspecified: Secondary | ICD-10-CM

## 2024-01-02 DIAGNOSIS — R58 Hemorrhage, not elsewhere classified: Secondary | ICD-10-CM | POA: Diagnosis not present

## 2024-01-02 DIAGNOSIS — Z7984 Long term (current) use of oral hypoglycemic drugs: Secondary | ICD-10-CM

## 2024-01-02 DIAGNOSIS — K760 Fatty (change of) liver, not elsewhere classified: Secondary | ICD-10-CM | POA: Diagnosis present

## 2024-01-02 DIAGNOSIS — K5731 Diverticulosis of large intestine without perforation or abscess with bleeding: Secondary | ICD-10-CM | POA: Diagnosis not present

## 2024-01-02 DIAGNOSIS — I10 Essential (primary) hypertension: Secondary | ICD-10-CM | POA: Diagnosis not present

## 2024-01-02 DIAGNOSIS — K635 Polyp of colon: Secondary | ICD-10-CM | POA: Diagnosis present

## 2024-01-02 DIAGNOSIS — Z8711 Personal history of peptic ulcer disease: Secondary | ICD-10-CM | POA: Diagnosis not present

## 2024-01-02 DIAGNOSIS — N1831 Chronic kidney disease, stage 3a: Secondary | ICD-10-CM | POA: Diagnosis present

## 2024-01-02 DIAGNOSIS — Z7902 Long term (current) use of antithrombotics/antiplatelets: Secondary | ICD-10-CM

## 2024-01-02 DIAGNOSIS — Z833 Family history of diabetes mellitus: Secondary | ICD-10-CM

## 2024-01-02 DIAGNOSIS — E1122 Type 2 diabetes mellitus with diabetic chronic kidney disease: Secondary | ICD-10-CM | POA: Diagnosis not present

## 2024-01-02 DIAGNOSIS — K219 Gastro-esophageal reflux disease without esophagitis: Secondary | ICD-10-CM | POA: Diagnosis present

## 2024-01-02 DIAGNOSIS — K5730 Diverticulosis of large intestine without perforation or abscess without bleeding: Secondary | ICD-10-CM | POA: Diagnosis not present

## 2024-01-02 DIAGNOSIS — E785 Hyperlipidemia, unspecified: Secondary | ICD-10-CM | POA: Diagnosis present

## 2024-01-02 DIAGNOSIS — N3289 Other specified disorders of bladder: Secondary | ICD-10-CM | POA: Diagnosis not present

## 2024-01-02 DIAGNOSIS — Z87891 Personal history of nicotine dependence: Secondary | ICD-10-CM

## 2024-01-02 DIAGNOSIS — K920 Hematemesis: Secondary | ICD-10-CM | POA: Diagnosis not present

## 2024-01-02 DIAGNOSIS — Z800 Family history of malignant neoplasm of digestive organs: Secondary | ICD-10-CM

## 2024-01-02 HISTORY — PX: IR ANGIOGRAM PELVIS SELECTIVE OR SUPRASELECTIVE: IMG661

## 2024-01-02 HISTORY — PX: IR ANGIOGRAM SELECTIVE EACH ADDITIONAL VESSEL: IMG667

## 2024-01-02 HISTORY — PX: IR ANGIOGRAM VISCERAL SELECTIVE: IMG657

## 2024-01-02 LAB — COMPREHENSIVE METABOLIC PANEL WITH GFR
ALT: 15 U/L (ref 0–44)
AST: 20 U/L (ref 15–41)
Albumin: 3.2 g/dL — ABNORMAL LOW (ref 3.5–5.0)
Alkaline Phosphatase: 108 U/L (ref 38–126)
Anion gap: 11 (ref 5–15)
BUN: 26 mg/dL — ABNORMAL HIGH (ref 8–23)
CO2: 23 mmol/L (ref 22–32)
Calcium: 9 mg/dL (ref 8.9–10.3)
Chloride: 105 mmol/L (ref 98–111)
Creatinine, Ser: 1.14 mg/dL — ABNORMAL HIGH (ref 0.44–1.00)
GFR, Estimated: 47 mL/min — ABNORMAL LOW (ref >60)
Glucose, Bld: 142 mg/dL — ABNORMAL HIGH (ref 70–99)
Potassium: 4.5 mmol/L (ref 3.5–5.1)
Sodium: 139 mmol/L (ref 135–145)
Total Bilirubin: 0.7 mg/dL (ref 0.0–1.2)
Total Protein: 6.3 g/dL — ABNORMAL LOW (ref 6.5–8.1)

## 2024-01-02 LAB — CBC WITH DIFFERENTIAL/PLATELET
Abs Immature Granulocytes: 0.05 K/uL (ref 0.00–0.07)
Basophils Absolute: 0.1 K/uL (ref 0.0–0.1)
Basophils Relative: 1 %
Eosinophils Absolute: 0.4 K/uL (ref 0.0–0.5)
Eosinophils Relative: 4 %
HCT: 35.9 % — ABNORMAL LOW (ref 36.0–46.0)
Hemoglobin: 11.6 g/dL — ABNORMAL LOW (ref 12.0–15.0)
Immature Granulocytes: 1 %
Lymphocytes Relative: 11 %
Lymphs Abs: 1.2 K/uL (ref 0.7–4.0)
MCH: 29.1 pg (ref 26.0–34.0)
MCHC: 32.3 g/dL (ref 30.0–36.0)
MCV: 90 fL (ref 80.0–100.0)
Monocytes Absolute: 0.7 K/uL (ref 0.1–1.0)
Monocytes Relative: 7 %
Neutro Abs: 7.9 K/uL — ABNORMAL HIGH (ref 1.7–7.7)
Neutrophils Relative %: 76 %
Platelets: 288 K/uL (ref 150–400)
RBC: 3.99 MIL/uL (ref 3.87–5.11)
RDW: 14.1 % (ref 11.5–15.5)
WBC: 10.4 K/uL (ref 4.0–10.5)
nRBC: 0 % (ref 0.0–0.2)

## 2024-01-02 LAB — CBC
HCT: 32.8 % — ABNORMAL LOW (ref 36.0–46.0)
Hemoglobin: 10.5 g/dL — ABNORMAL LOW (ref 12.0–15.0)
MCH: 29.1 pg (ref 26.0–34.0)
MCHC: 32 g/dL (ref 30.0–36.0)
MCV: 90.9 fL (ref 80.0–100.0)
Platelets: 255 K/uL (ref 150–400)
RBC: 3.61 MIL/uL — ABNORMAL LOW (ref 3.87–5.11)
RDW: 14.4 % (ref 11.5–15.5)
WBC: 14.1 K/uL — ABNORMAL HIGH (ref 4.0–10.5)
nRBC: 0 % (ref 0.0–0.2)

## 2024-01-02 LAB — HEMOGLOBIN AND HEMATOCRIT, BLOOD
HCT: 30.6 % — ABNORMAL LOW (ref 36.0–46.0)
Hemoglobin: 10 g/dL — ABNORMAL LOW (ref 12.0–15.0)

## 2024-01-02 LAB — POC OCCULT BLOOD, ED: Fecal Occult Bld: POSITIVE — AB

## 2024-01-02 LAB — GLUCOSE, CAPILLARY
Glucose-Capillary: 152 mg/dL — ABNORMAL HIGH (ref 70–99)
Glucose-Capillary: 121 mg/dL — ABNORMAL HIGH (ref 70–99)

## 2024-01-02 LAB — PROTIME-INR
INR: 1 (ref 0.8–1.2)
Prothrombin Time: 13.7 s (ref 11.4–15.2)

## 2024-01-02 LAB — APTT: aPTT: 28 s (ref 24–36)

## 2024-01-02 LAB — MRSA NEXT GEN BY PCR, NASAL: MRSA by PCR Next Gen: NOT DETECTED

## 2024-01-02 MED ORDER — LIDOCAINE-EPINEPHRINE 1 %-1:100000 IJ SOLN
20.0000 mL | Freq: Once | INTRAMUSCULAR | Status: AC
  Administered 2024-01-02: 20 mL via INTRADERMAL

## 2024-01-02 MED ORDER — HYDRALAZINE HCL 20 MG/ML IJ SOLN
5.0000 mg | Freq: Four times a day (QID) | INTRAMUSCULAR | Status: DC | PRN
  Administered 2024-01-06: 5 mg via INTRAVENOUS
  Filled 2024-01-02: qty 1

## 2024-01-02 MED ORDER — FENTANYL CITRATE (PF) 100 MCG/2ML IJ SOLN
INTRAMUSCULAR | Status: AC | PRN
  Administered 2024-01-02: 25 ug via INTRAVENOUS
  Administered 2024-01-02: 50 ug via INTRAVENOUS

## 2024-01-02 MED ORDER — PANTOPRAZOLE SODIUM 40 MG IV SOLR
40.0000 mg | Freq: Two times a day (BID) | INTRAVENOUS | Status: DC
  Administered 2024-01-02 – 2024-01-06 (×8): 40 mg via INTRAVENOUS
  Filled 2024-01-02 (×8): qty 10

## 2024-01-02 MED ORDER — ACETAMINOPHEN 500 MG PO TABS
500.0000 mg | ORAL_TABLET | Freq: Every morning | ORAL | Status: DC
  Administered 2024-01-03 – 2024-01-06 (×3): 500 mg via ORAL
  Filled 2024-01-02 (×3): qty 1

## 2024-01-02 MED ORDER — SODIUM CHLORIDE 0.9 % IV SOLN: 250.0000 mL | INTRAVENOUS | Status: DC | PRN

## 2024-01-02 MED ORDER — SODIUM CHLORIDE 0.9 % IV SOLN: INTRAVENOUS | Status: DC

## 2024-01-02 MED ORDER — SODIUM CHLORIDE 0.9% FLUSH
3.0000 mL | Freq: Two times a day (BID) | INTRAVENOUS | Status: DC
  Administered 2024-01-03: 3 mL via INTRAVENOUS

## 2024-01-02 MED ORDER — POLYETHYLENE GLYCOL 3350 17 G PO PACK: 17.0000 g | PACK | Freq: Every day | ORAL | Status: DC | PRN

## 2024-01-02 MED ORDER — MIDAZOLAM HCL 2 MG/2ML IJ SOLN
INTRAMUSCULAR | Status: AC | PRN
  Administered 2024-01-02: .5 mg via INTRAVENOUS
  Administered 2024-01-02: 1 mg via INTRAVENOUS

## 2024-01-02 MED ORDER — ONDANSETRON HCL 4 MG/2ML IJ SOLN
4.0000 mg | Freq: Four times a day (QID) | INTRAMUSCULAR | Status: DC | PRN
  Administered 2024-01-03: 4 mg via INTRAVENOUS
  Filled 2024-01-02: qty 2

## 2024-01-02 MED ORDER — IOHEXOL 300 MG/ML  SOLN
100.0000 mL | Freq: Once | INTRAMUSCULAR | Status: AC | PRN
  Administered 2024-01-02: 28 mL via INTRAVENOUS

## 2024-01-02 MED ORDER — FENTANYL CITRATE (PF) 100 MCG/2ML IJ SOLN
INTRAMUSCULAR | Status: AC
  Filled 2024-01-02: qty 2

## 2024-01-02 MED ORDER — LIDOCAINE-EPINEPHRINE 1 %-1:100000 IJ SOLN
INTRAMUSCULAR | Status: AC
  Filled 2024-01-02: qty 1

## 2024-01-02 MED ORDER — SODIUM CHLORIDE 0.9% FLUSH: 3.0000 mL | INTRAVENOUS | Status: DC | PRN

## 2024-01-02 MED ORDER — IOHEXOL 350 MG/ML SOLN
100.0000 mL | Freq: Once | INTRAVENOUS | Status: AC | PRN
  Administered 2024-01-02: 100 mL via INTRAVENOUS

## 2024-01-02 MED ORDER — MIDAZOLAM HCL 2 MG/2ML IJ SOLN
INTRAMUSCULAR | Status: AC
  Filled 2024-01-02: qty 2

## 2024-01-02 MED ORDER — ALPRAZOLAM 0.25 MG PO TABS
0.2500 mg | ORAL_TABLET | Freq: Two times a day (BID) | ORAL | Status: AC | PRN
Start: 2024-01-02 — End: ?
  Administered 2024-01-02 – 2024-01-04 (×3): 0.25 mg via ORAL
  Filled 2024-01-02 (×3): qty 1

## 2024-01-02 MED ORDER — VITAMIN D 25 MCG (1000 UNIT) PO TABS
5000.0000 [IU] | ORAL_TABLET | Freq: Every day | ORAL | Status: DC
  Administered 2024-01-03 – 2024-01-06 (×4): 5000 [IU] via ORAL
  Filled 2024-01-02 (×4): qty 5

## 2024-01-02 MED ORDER — INSULIN ASPART 100 UNIT/ML IJ SOLN
0.0000 [IU] | Freq: Three times a day (TID) | INTRAMUSCULAR | Status: DC
  Administered 2024-01-02: 2 [IU] via SUBCUTANEOUS
  Administered 2024-01-03 – 2024-01-05 (×2): 1 [IU] via SUBCUTANEOUS

## 2024-01-02 MED ORDER — AMLODIPINE BESYLATE 2.5 MG PO TABS
2.5000 mg | ORAL_TABLET | Freq: Every day | ORAL | Status: DC
  Administered 2024-01-03 – 2024-01-04 (×2): 2.5 mg via ORAL
  Filled 2024-01-02 (×2): qty 1

## 2024-01-02 MED ORDER — ACETAMINOPHEN 325 MG PO TABS
650.0000 mg | ORAL_TABLET | Freq: Every day | ORAL | Status: AC
Start: 2024-01-02 — End: ?
  Administered 2024-01-02 – 2024-01-05 (×4): 650 mg via ORAL
  Filled 2024-01-02 (×4): qty 2

## 2024-01-02 MED ORDER — ONDANSETRON HCL 4 MG PO TABS: 4.0000 mg | ORAL_TABLET | Freq: Four times a day (QID) | ORAL | Status: DC | PRN

## 2024-01-02 MED ORDER — IOHEXOL 300 MG/ML  SOLN
100.0000 mL | Freq: Once | INTRAMUSCULAR | Status: AC | PRN
  Administered 2024-01-02: 25 mL via INTRA_ARTERIAL

## 2024-01-02 NOTE — Procedures (Signed)
 Interventional Radiology Procedure Note  Procedure: IMA angiogram  Complications: None  Estimated Blood Loss: < 10 mL  Findings: No active bleeding seen.  No further plans for intervention.  Cordella DELENA Banner, MD

## 2024-01-02 NOTE — ED Notes (Signed)
 Patient transported to IR

## 2024-01-02 NOTE — ED Notes (Signed)
 Pt ambulated to bathroom and back to room with standby assist.

## 2024-01-02 NOTE — ED Notes (Signed)
 Patient transported to CT

## 2024-01-02 NOTE — Hospital Course (Signed)
 83 year old female presents via EMS with complaints of rectal bleeding since 5 AM.  Patient advises she woke up at 5 AM with a feeling of having to pass gas when she did she wet her underwear and noticed it was bright red blood.  She went to the restroom and had a bowel movement and noted a large amount of bright red blood in the toilet bowl.  Patient is on Plavix  for a atherosclerotic artery which was not a candidate for stenting per cardiology.  Patient also has significant history of diabetes, hemorrhoids (controlled with Preparation H), and previous history of gastric ulcer managed with medication.  Patient also has history of hypertension and reports she has not taken her hypertensive medication today.  Patient denies rectal pain, abdominal pain, diarrhea, constipation, weakness, headache, dizziness, chest pain, shortness of breath

## 2024-01-02 NOTE — ED Notes (Signed)
 CCMD called and notified

## 2024-01-02 NOTE — ED Notes (Signed)
 This RN assisted pt to bathroom and while sitting on toilet there was large bright red blood without stool in toilet prior to pt becoming pale, diaphoretic, and pt leaned forward without falling to ground. This RN held onto patient until she returned to responding. Pt was able to get back into wheelchair with assistance and back into bed. VSS at this time.

## 2024-01-02 NOTE — Consult Note (Signed)
 Chief Complaint: Patient was seen in consultation today for acute lower GI bleeding.  Referring Physician(s): * No referring provider recorded for this case *  Supervising Physician: Jenna Hacker  Patient Status: [facility] - ED  History of Present Illness: Brittany Mcclure is a 83 y.o. female with a past medical history significant for anxiety, depression, GERD, CKD, HTN, HLD, CAD on Plavix  + ASA, DM, gastric ulcer, diverticulosis who presented to the ED this morning with complaints of bloody bowel movements and dizziness. Initial workup showed hgb 11.6, plt 288, creatinine 1.14, INR 1.0. CTA was obtained and showed:  VASCULAR   No aortic aneurysm or aortic dissection.   NON-VASCULAR   1. Active diverticular bleeding from the distal sigmoid colon. GI and surgical consultation recommended. 2. Descending and sigmoid colonic diverticulosis. No changes of acute diverticulitis. 3. Diffuse hepatic steatosis.  She has been admitted and IR has been consulted for possible angiogram with embolization.  Brittany Mcclure seen in the ED, she is resting in quietly in bed and feels like she might need to have another bowel movement soon due to left sided pain that seems to worsen when a bowel movement is impending. When she is laying down she does not feel dizzy or nauseous, however at home she felt dizzy after seeing a bloody bowel movement. She had an additional bloody bowel movement in the ED and felt dizzy and nauseous immediately after but recovered quickly. She denies loss of consciousness at home or in the hospital. Discussed angiogram with possible embolization today which she is agreeable to.  Past Medical History:  Diagnosis Date   Allergy    Anxiety    Arthritis    with gout   Carotid bruit    right   Cataract    bil cataracts removed   Chest pain    Chronic kidney disease    Colon polyps    Depression    Diverticulosis    Duodenitis    Exogenous obesity    Gastric ulcer     GERD (gastroesophageal reflux disease)    Hyperlipidemia    Hypertension    Metabolic syndrome    Osteopenia    Seasonal allergies    seasonal   Tubular adenoma of colon    Type 2 diabetes mellitus (HCC)     Past Surgical History:  Procedure Laterality Date   COLONOSCOPY     11/90,11/91,1994,2000,2010   CORONARY PRESSURE/FFR STUDY N/A 08/31/2023   Procedure: CORONARY PRESSURE/FFR STUDY;  Surgeon: Mady Bruckner, MD;  Location: [facility] INVASIVE CV [facility];  Service: Cardiovascular;  Laterality: N/A;   DILATION AND CURETTAGE OF UTERUS  1997   FOOT SURGERY     x 2   HAND SURGERY Right    Carpel Tunnel   HYSTEROSCOPY WITH D & C N/A 11/06/2023   Procedure: DILATATION AND CURETTAGE /HYSTEROSCOPY;  Surgeon: Cleotilde Ronal RAMAN, MD;  Location: [facility] OR;  Service: Gynecology;  Laterality: N/A;   LAPAROSCOPIC UNILATERAL SALPINGO OOPHERECTOMY Left 11/06/2023   Procedure: SALPINGO-OOPHORECTOMY, BILATERAL, LAPAROSCOPIC;  Surgeon: Cleotilde Ronal RAMAN, MD;  Location: [facility] OR;  Service: Gynecology;  Laterality: Left;  laparoscopic left salpingoophrectomy, Possible right salpingoophrectomy,   LEFT HEART CATH AND CORONARY ANGIOGRAPHY N/A 08/31/2023   Procedure: LEFT HEART CATH AND CORONARY ANGIOGRAPHY;  Surgeon: Mady Bruckner, MD;  Location: [facility] INVAS[facility] CV LAB;  Service: Cardiovascular;  Laterality: N/A;   POLYPECTOMY     TUBAL LIGATION      Allergies: Statins, Sulfa antibiotics, and Zetia  [ezetimibe ]  Medications:  Prior to Admission medications   Medication Sig Start Date End Date Taking? Authorizing Provider  acetaminophen  (TYLENOL ) 500 MG tablet Take 500 mg by mouth every morning.   Yes [provider]  acetaminophen  (TYLENOL ) 650 MG CR tablet Take 650 mg by mouth at bedtime.   Yes [provider]  ALPRAZolam  (XANAX ) 0.25 MG tablet TAKE ONE TABLET BY MOUTH TWICE DAILY AS NEEDED Patient taking differently: Take 0.25 mg by mouth daily. 09/25/23  Yes Mast, Man X, NP  amLODipine  (NORVASC ) 2.5 MG  tablet Take 1 tablet (2.5 mg total) by mouth daily. 08/31/23 08/30/24 Yes End, Lonni, MD  aspirin  EC 81 MG tablet Take 1 tablet (81 mg total) by mouth daily. Swallow whole. 08/28/23  Yes Chandrasekhar, Mahesh A, MD  Cholecalciferol (VITAMIN D3) 125 MCG (5000 UT) TABS Take 5,000 Units by mouth daily.   Yes [provider]  clopidogrel  (PLAVIX ) 75 MG tablet Take 1 tablet (75 mg total) by mouth daily. 07/17/23  Yes Thukkani, Arun K, MD  esomeprazole  (NEXIUM ) 40 MG capsule Take 1 capsule (40 mg total) by mouth daily. 11/02/23  Yes [place], PA-C  FLUoxetine  (PROZAC ) 40 MG capsule TAKE ONE CAPSULE BY MOUTH EVERY DAY. 10/30/23  Yes Mast, Man X, NP  glipiZIDE  (GLUCOTROL ) 5 MG tablet Take 1 tablet (5 mg total) by mouth 2 (two) times daily before a meal. 10/30/23  Yes Mast, Man X, NP  Glycerin-Hypromellose-PEG 400 (DRY EYE RELIEF DROPS OP) Place 1 drop into both eyes daily as needed (Dry eye).   Yes [provider]  Multiple Vitamins-Minerals (PRESERVISION AREDS PO) Take 1 tablet by mouth in the morning and at bedtime.   Yes [provider]  Omega-3 Fatty Acids (FISH OIL) 1000 MG CAPS Take 1,000 mg by mouth daily.   Yes [provider]  sucralfate  (CARAFATE ) 1 g tablet Take 1 tablet (1 g total) by mouth 3 (three) times daily as needed. 11/02/23 04/30/24 Yes [place], PA-C  triamcinolone  cream (KENALOG ) 0.1 % Apply 1 Application topically 2 (two) times daily. Apply 2 times daily to the back for up to 2 weeks then STOP, Resume in 2 weeks if irritation is not resolved Patient taking differently: Apply 1 Application topically as needed (skin irritation). Apply 2 times daily to the back for up to 2 weeks then STOP, Resume in 2 weeks if irritation is not resolved 06/13/23  Yes Alm Delon SAILOR, DO  Accu-Chek FastClix Lancets MISC USE AS DIRECTED. 04/18/22   Mast, Man X, NP  glucose blood (ACCU-CHEK SMARTVIEW) test strip CHECK BLOOD SUGAR 3 TIMES DAILY AS DIRECTED. 04/18/22    Mast, Man X, NP     Family History  Problem Relation Age of Onset   Colon cancer Mother    Stroke Father    Hypertension Father    Heart failure Maternal Grandmother    Diabetes Maternal Grandmother        ?   Rectal cancer Neg Hx    Stomach cancer Neg Hx    Esophageal cancer Neg Hx    Pancreatic cancer Neg Hx     Social History   Socioeconomic History   Marital status: Widowed    Spouse name: Not on file   Number of children: 2   Years of education: Not on file   Highest education level: Not on file  Occupational History   Occupation: retired    Associate Professor: RETIRED  Tobacco Use   Smoking status: Former    Passive exposure: Never  Smokeless tobacco: Never   Tobacco comments:    Late 20's or early 30's  Vaping Use   Vaping status: Never Used  Substance and Sexual Activity   Alcohol use: Yes    Alcohol/week: 0.0 standard drinks of alcohol    Comment: socially   Drug use: No   Sexual activity: Not on file  Other Topics Concern   Not on file  Social History Narrative   Tobacco use, amount per day now: None      Past tobacco use, amount per day: in her late 53s a pack per day      How many years did you use tobacco: 10      Alcohol use (drinks per week): occasional wine      Diet:      Do you drink/eat things with caffeine? Tea, soft drinks and coffee      Marital status: Married             What year were you married? 1962      Do you live in a house, apartment, assisted living, condo, trailer? Apartment      Is it one or more stories? Yes      How many persons live in your home? 0      Do you have any pets in your home? 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 Yes            How often? Water aerobics       Do you have a living will? Yes      Do you have a DNR form? Yes           If not, do you want to discuss one?      Do you have signed POA/HPOA forms?  Yes            Social Drivers of Research scientist (physical sciences) Strain: Low Risk  (02/21/2017)   Overall Financial Resource Strain (CARDIA)    Difficulty of Paying Living Expenses: Not hard at all  Food Insecurity: No Food Insecurity (02/21/2017)   Hunger Vital Sign    Worried About Running Out of Food in the Last Year: Never true    Ran Out of Food in the Last Year: Never true  Transportation Needs: No Transportation Needs (02/21/2017)   PRAPARE - Administrator, Civil Service (Medical): No    Lack of Transportation (Non-Medical): No  Physical Activity: Insufficiently Active (02/21/2017)   Exercise Vital Sign    Days of Exercise per Week: 3 days    Minutes of Exercise per Session: 30 min  Stress: No Stress Concern Present (02/21/2017)   [employer] of Occupational Health - Occupational Stress Questionnaire    Feeling of Stress : Only a little  Social Connections: Moderately Integrated (03/05/2018)   Social Connection and Isolation Panel    Frequency of Communication with Friends and Family: More than three times a week    Frequency of Social Gatherings with Friends and Family: More than three times a week    Attends Religious Services: 1 to 4 times per year    Active Member of [employer] or Organizations: Yes    Attends Banker Meetings: 1 to 4 times per year    Marital Status: Widowed     Review of Systems: A 12 point ROS discussed and pertinent positives are indicated in the HPI  above.  All other systems are negative.  Review of Systems  Constitutional:  Negative for chills and fever.  Respiratory:  Negative for cough and shortness of breath.   Cardiovascular:  Negative for chest pain.  Gastrointestinal:  Positive for abdominal pain (LLQ, especially when about to have a bowel movement) and blood in stool. Negative for diarrhea, nausea and vomiting.  Musculoskeletal:  Negative for back pain.  Neurological:  Negative for dizziness (when having a blood BM but otherwise none) and headaches.    Vital Signs: BP  139/64   Pulse 98   Temp 98 F (36.7 C) (Oral)   Resp 17   Ht 5' 1 (1.549 m)   Wt 200 lb (90.7 kg)   SpO2 100%   BMI 37.79 kg/m   Physical Exam Vitals and nursing note reviewed.  Constitutional:      General: She is not in acute distress. HENT:     Head: Normocephalic.  Cardiovascular:     Rate and Rhythm: Normal rate and regular rhythm.  Pulmonary:     Effort: Pulmonary effort is normal.     Breath sounds: Normal breath sounds.     Comments: On room air Abdominal:     Palpations: Abdomen is soft.  Skin:    General: Skin is warm and dry.  Neurological:     Mental Status: She is alert and oriented to person, place, and time.  Psychiatric:        Mood and Affect: Mood normal.        Behavior: Behavior normal.        Thought Content: Thought content normal.        Judgment: Judgment normal.      MD Evaluation Airway: WNL Heart: WNL Abdomen: WNL Chest/ Lungs: WNL ASA  Classification: 3 Mallampati/Airway Score: Two   Imaging: CT ANGIO GI BLEED Result Date: 01/02/2024 CLINICAL DATA:  Acute Lower GI bleed EXAM: CTA ABDOMEN AND PELVIS WITHOUT AND WITH CONTRAST TECHNIQUE: Initially, a noncontrast CT of the abdomen and pelvis was performed. Subsequently, multidetector CT imaging of the abdomen and pelvis was performed using the standard protocol during bolus administration of intravenous contrast. Multiplanar reconstructed images and MIPs were obtained and reviewed to evaluate the vascular anatomy. RADIATION DOSE REDUCTION: This exam was performed according to the departmental dose-optimization program which includes automated exposure control, adjustment of the mA and/or kV according to patient size and/or use of iterative reconstruction technique. CONTRAST:  OMNIPAQUE  IOHEXOL  350 MG/ML SOLN COMPARISON:  None Available. FINDINGS: VASCULAR Aorta: No aortic aneurysm or dissection. Calcified and noncalcified plaque scattered throughout the aorta. No hemodynamically  significant stenosis. Celiac: Patent without acute thrombus, aneurysm, or dissection.No hemodynamically significant stenosis. SMA: Patent without acute thrombus, aneurysm, or dissection.Mild-to-moderate narrowing of the proximal SMA from noncalcified plaque. Renals: Patent without acute thrombus, aneurysm, or dissection.Severe narrowing of the right renal artery ostium from mixed density plaque. IMA: Patent without acute thrombus, aneurysm, or dissection.No hemodynamically significant stenosis. Inflow: Patent without acute thrombus, aneurysm, or dissection.No hemodynamically significant stenosis. Proximal Outflow: The bilateral common femoral and visualized portions of the superficial and profunda femoral arteries are patent without acute thrombus, aneurysm, or dissection.No hemodynamically significant stenosis. Veins: No obvious venous abnormality within the limitations of this arterial phase study. Review of the MIP images confirms the above findings. NON-VASCULAR Lower chest: No focal airspace consolidation or pleural effusion. Hepatobiliary: The most superior aspects of the right and left hepatic lobes are excluded from field of view. No mass.Diffuse hepatic  steatosis.No radiopaque stones or wall thickening of the gallbladder.No intrahepatic or extrahepatic biliary ductal dilation. Pancreas: No mass or main ductal dilation.No peripancreatic inflammation or fluid collection. Spleen: Normal size. No mass. Adrenals/Urinary Tract: Nodular thickening within both adrenal glands, more so on the left, without discrete mass or dominant nodule. Subcentimeter hypodensities are noted in the kidneys, too small to definitively characterize, but likely small cysts. No hydronephrosis or nephrolithiasis. The urinary bladder is distended without focal abnormality. Stomach/Bowel:Small hiatal hernia. The stomach is decompressed without focal abnormality. No small bowel wall thickening or inflammation. No small bowel  obstruction.Normal appendix. Descending and sigmoid colonic diverticulosis. No changes of acute diverticulitis. GI Bleed: Active diverticular bleeding from the distal sigmoid colon. Lymphatic: No intraabdominal or pelvic lymphadenopathy. Reproductive: Age-related atrophy of the uterus and ovaries. No concerning adnexal mass.No free pelvic fluid. Other: No pneumoperitoneum, ascites, or mesenteric inflammation. Musculoskeletal: No acute fracture or destructive lesion.Diffuse osteopenia. Multilevel degenerative disc disease of the spine. IMPRESSION: VASCULAR No aortic aneurysm or aortic dissection. NON-VASCULAR 1. Active diverticular bleeding from the distal sigmoid colon. GI and surgical consultation recommended. 2. Descending and sigmoid colonic diverticulosis. No changes of acute diverticulitis. 3. Diffuse hepatic steatosis. Critical Value/emergent results were called by telephone at the time of interpretation on 01/02/2024 at 11:03 am to provider Soyla Body, MD, who verbally acknowledged these results. Electronically Signed   By: Rogelia Myers M.D.   On: 01/02/2024 11:07    Labs:  CBC: Recent Labs    08/28/23 1622 11/06/23 0948 01/02/24 0726 01/02/24 1104  WBC 8.9 8.3 10.4 14.1*  HGB 12.6 12.4 11.6* 10.5*  HCT 38.7 38.8 35.9* 32.8*  PLT 296 310 288 255    COAGS: Recent Labs    01/02/24 0726  INR 1.0  APTT 28    BMP: Recent Labs    08/28/23 1622 08/31/23 0631 11/06/23 0948 01/02/24 0726  NA 140 139 139 139  K 5.4* 4.4 4.6 4.5  CL 101 106 104 105  CO2 21 23 22 23   GLUCOSE 96 90 140* 142*  BUN 29* 28* 25* 26*  CALCIUM 9.8 9.2 9.2 9.0  CREATININE 1.28* 1.14* 1.27* 1.14*  GFRNONAA  --  47* 41* 47*    LIVER FUNCTION TESTS: Recent Labs    03/29/23 1105 01/02/24 0726  BILITOT 0.4 0.7  AST 21 20  ALT 22 15  ALKPHOS 85 108  PROT 6.6 6.3*  ALBUMIN 4.3 3.2*    TUMOR MARKERS: No results for input(s): AFPTM, CEA, CA199, CHROMGRNA in the last 8760  hours.  Assessment and Plan:  83 y/o F who presented to the ED this morning with bloody bowel movements and dizziness at home, found to have active diverticular bleeding from the distal sigmoid colon. IR has been consulted for angiogram with possible embolization - patient history and imaging reviewed by Dr. Jenna who approves procedure for today.  Patient has been NPO since at least midnight, she thinks last PO was around 5 pm yesterday - definitely nothing since being in the hospital. On Plavix  + ASA at home.  Risks and benefits of abdominal/pelvic arteriogram with intervention were discussed with the patient including, but not limited to bleeding, infection, vascular injury, contrast induced renal failure, stroke, reperfusion hemorrhage, or even death.  This interventional procedure involves the use of X-rays and because of the nature of the planned procedure, it is possible that we will have prolonged use of X-ray fluoroscopy. Potential radiation risks to you include (but are not limited to) the following: -  A slightly elevated risk for cancer  several years later in life. This risk is typically less than 0.5% percent. This risk is low in comparison to the normal incidence of human cancer, which is 33% for women and 50% for men according to the American Cancer Society. - Radiation induced injury can include skin redness, resembling a rash, tissue breakdown / ulcers and hair loss (which can be temporary or permanent).  The likelihood of either of these occurring depends on the difficulty of the procedure and whether you are sensitive to radiation due to previous procedures, disease, or genetic conditions.  IF your procedure requires a prolonged use of radiation, you will be notified and given written instructions for further action.  It is your responsibility to monitor the irradiated area for the 2 weeks following the procedure and to notify your physician if you are concerned that you have  suffered a radiation induced injury.    All of the patient's questions were answered, patient is agreeable to proceed.  Consent signed and in chart.  Thank you for this interesting consult.  I greatly enjoyed meeting [place] LAPAGLIA and look forward to participating in their care.  A copy of this report was sent to the requesting provider on this date.  Electronically Signed: Clotilda DELENA Hesselbach, PA-C 01/02/2024, 12:31 PM   I spent a total of 40 Minutes  in face to face in clinical consultation, greater than 50% of which was counseling/coordinating care for lower GI bleeding.

## 2024-01-02 NOTE — ED Provider Notes (Signed)
 [facility] [place] [facility]ENC[facility] DE[facility]MENT AT [facility] Provider Note   CSN: [id number] Arrival date & time: 01/02/24  0630     Patient presents with: Rectal Bleeding   Brittany Mcclure is a 83 y.o. female.  83 year old female presents via EMS with complaints of rectal bleeding since 5 AM.  Patient advises she woke up at 5 AM with a feeling of having to pass gas when she did she wet her underwear and noticed it was bright red blood.  She went to the restroom and had a bowel movement and noted a large amount of bright red blood in the toilet bowl.  Patient is on Plavix  for a atherosclerotic artery which was not a candidate for stenting per cardiology.  Patient also has significant history of diabetes, hemorrhoids (controlled with Preparation H), and previous history of gastric ulcer managed with medication.  Patient also has history of hypertension and reports she has not taken her hypertensive medication today.  Patient denies rectal pain, abdominal pain, diarrhea, constipation, weakness, headache, dizziness, chest pain, shortness of breath.     Prior to Admission medications   Medication Sig Start Date End Date Taking? Authorizing Provider  acetaminophen  (TYLENOL ) 500 MG tablet Take 500 mg by mouth every morning.   Yes [provider]  acetaminophen  (TYLENOL ) 650 MG CR tablet Take 650 mg by mouth at bedtime.   Yes [provider]  ALPRAZolam  (XANAX ) 0.25 MG tablet TAKE ONE TABLET BY MOUTH TWICE DAILY AS NEEDED Patient taking differently: Take 0.25 mg by mouth daily. 09/25/23  Yes Mast, Man X, NP  amLODipine  (NORVASC ) 2.5 MG tablet Take 1 tablet (2.5 mg total) by mouth daily. 08/31/23 08/30/24 Yes End, Lonni, MD  aspirin  EC 81 MG tablet Take 1 tablet (81 mg total) by mouth daily. Swallow whole. 08/28/23  Yes Chandrasekhar, Mahesh A, MD  Cholecalciferol (VITAMIN D3) 125 MCG (5000 UT) TABS Take 5,000 Units by mouth daily.   Yes [provider]  clopidogrel  (PLAVIX )  75 MG tablet Take 1 tablet (75 mg total) by mouth daily. 07/17/23  Yes Thukkani, Arun K, MD  esomeprazole  (NEXIUM ) 40 MG capsule Take 1 capsule (40 mg total) by mouth daily. 11/02/23  Yes [place], PA-C  FLUoxetine  (PROZAC ) 40 MG capsule TAKE ONE CAPSULE BY MOUTH EVERY DAY. 10/30/23  Yes Mast, Man X, NP  glipiZIDE  (GLUCOTROL ) 5 MG tablet Take 1 tablet (5 mg total) by mouth 2 (two) times daily before a meal. 10/30/23  Yes Mast, Man X, NP  Glycerin-Hypromellose-PEG 400 (DRY EYE RELIEF DROPS OP) Place 1 drop into both eyes daily as needed (Dry eye).   Yes [provider]  Multiple Vitamins-Minerals (PRESERVISION AREDS PO) Take 1 tablet by mouth in the morning and at bedtime.   Yes [provider]  Omega-3 Fatty Acids (FISH OIL) 1000 MG CAPS Take 1,000 mg by mouth daily.   Yes [provider]  sucralfate  (CARAFATE ) 1 g tablet Take 1 tablet (1 g total) by mouth 3 (three) times daily as needed. 11/02/23 04/30/24 Yes [place], PA-C  triamcinolone  cream (KENALOG ) 0.1 % Apply 1 Application topically 2 (two) times daily. Apply 2 times daily to the back for up to 2 weeks then STOP, Resume in 2 weeks if irritation is not resolved Patient taking differently: Apply 1 Application topically as needed (skin irritation). Apply 2 times daily to the back for up to 2 weeks then STOP, Resume in 2 weeks if irritation is not resolved 06/13/23  Yes Alm,  Delon SAILOR, DO  Accu-Chek FastClix Lancets MISC USE AS DIRECTED. 04/18/22   Mast, Man X, NP  glucose blood (ACCU-CHEK SMARTVIEW) test strip CHECK BLOOD SUGAR 3 TIMES DAILY AS DIRECTED. 04/18/22   Mast, Man X, NP    Allergies: Statins, Sulfa antibiotics, and Zetia  [ezetimibe ]    Review of Systems  Gastrointestinal:  Positive for anal bleeding and blood in stool.  All other systems reviewed and are negative.   Updated Vital Signs BP 134/67 (BP Location: Right Arm)   Pulse 92   Temp 97.9 F (36.6 C) (Oral)   Resp 19   Ht 5' 1 (1.549 m)    Wt 90.7 kg   SpO2 97%   BMI 37.79 kg/m   Physical Exam Vitals and nursing note reviewed.  Constitutional:      General: She is not in acute distress.    Appearance: Normal appearance. She is not ill-appearing.  HENT:     Head: Normocephalic and atraumatic.  Eyes:     Extraocular Movements: Extraocular movements intact.     Pupils: Pupils are equal, round, and reactive to light.  Cardiovascular:     Rate and Rhythm: Normal rate.  Pulmonary:     Effort: Pulmonary effort is normal. No respiratory distress.  Abdominal:     Palpations: Abdomen is soft.     Tenderness: There is no abdominal tenderness. There is no guarding.  Genitourinary:    Rectum: External hemorrhoid present. Normal anal tone.     Comments: Dried red blood around rectum and red blood noted on rectal exam.  Musculoskeletal:        General: No swelling, tenderness or deformity. Normal range of motion.     Cervical back: Normal range of motion. No rigidity.     Right lower leg: No edema.     Left lower leg: No edema.  Skin:    General: Skin is warm and dry.     Capillary Refill: Capillary refill takes less than 2 seconds.  Neurological:     General: No focal deficit present.     Mental Status: She is alert.     Cranial Nerves: No cranial nerve deficit.     Motor: No weakness.  Psychiatric:        Mood and Affect: Mood normal.        Behavior: Behavior normal.     (all labs ordered are listed, but only abnormal results are displayed) Labs Reviewed  CBC WITH DIFFERENTIAL/PLATELET - Abnormal; Notable for the following components:      Result Value   Hemoglobin 11.6 (*)    HCT 35.9 (*)    Neutro Abs 7.9 (*)    All other components within normal limits  COMPREHENSIVE METABOLIC PANEL WITH GFR - Abnormal; Notable for the following components:   Glucose, Bld 142 (*)    BUN 26 (*)    Creatinine, Ser 1.14 (*)    Total Protein 6.3 (*)    Albumin 3.2 (*)    GFR, Estimated 47 (*)    All other components  within normal limits  CBC - Abnormal; Notable for the following components:   WBC 14.1 (*)    RBC 3.61 (*)    Hemoglobin 10.5 (*)    HCT 32.8 (*)    All other components within normal limits  GLUCOSE, CAPILLARY - Abnormal; Notable for the following components:   Glucose-Capillary 152 (*)    All other components within normal limits  HEMOGLOBIN AND HEMATOCRIT, BLOOD - Abnormal; Notable  for the following components:   Hemoglobin 10.0 (*)    HCT 30.6 (*)    All other components within normal limits  POC OCCULT BLOOD, ED - Abnormal; Notable for the following components:   Fecal Occult Bld POSITIVE (*)    All other components within normal limits  MRSA NEXT GEN BY PCR, NASAL  PROTIME-INR  APTT  BASIC METABOLIC PANEL WITH GFR  CBC    EKG: EKG Interpretation Date/Time:  Tuesday January 02 2024 07:27:44 EDT Ventricular Rate:  91 PR Interval:  157 QRS Duration:  81 QT Interval:  358 QTC Calculation: 441 R Axis:   7  Text Interpretation: Sinus rhythm Abnormal R-wave progression, early transition Left ventricular hypertrophy Confirmed by Francesca Fallow ([id number]) on 01/02/2024 7:29:25 AM  Radiology: IR Angiogram Pelvis Selective Or Supraselective Result Date: 01/02/2024 INDICATION: Acute GI bleed in a patient with bloody stool. CTA of the abdomen demonstrates active bleeding likely secondary to diverticulosis in the sigmoid colon. EXAM: Pelvic angiogram ANESTHESIA/SEDATION: Moderate (conscious) sedation was employed during this procedure. A total of Versed  1.5 mg and Fentanyl  75 mcg was administered intravenously by the radiology nurse. Total intra-service moderate Sedation Time: 30 minutes. The patient's level of consciousness and vital signs were monitored continuously by radiology nursing throughout the procedure under my direct supervision. CONTRAST:  53 mL Omnipaque  300 FLUOROSCOPY: Radiation Exposure Index (as provided by the fluoroscopic device): 3.8 minutes mGy Kerma  COMPLICATIONS: None immediate. PROCEDURE: Informed consent was obtained from the patient following explanation of the procedure, risks, benefits and alternatives. The patient understands, agrees and consents for the procedure. All questions were addressed. A time out was performed prior to the initiation of the procedure. Maximal barrier sterile technique utilized including caps, mask, sterile gowns, sterile gloves, large sterile drape, hand hygiene, and Betadine  prep. Ultrasound demonstrated the right common femoral artery to be anechoic and pulsatile indicating patency. The arteriotomy site was anesthetized with 1% lidocaine . Small incision was made at the arteriotomy site and blunt dissection was then applied. The needle was advanced from the incision to the midpoint of the artery under ultrasound guidance. Final image was obtained and stored in patient's permanent medical record. Access was exchanged over an 018 guidewire for micropuncture sheath. Access was exchanged over an 035 wire for a 6 [place] sheath. Guidewire and catheter were then advanced to the abdominal aorta. The Sim 1 catheter tip was then reformed and used to selectively cannulate the IMA based on anatomic features. Catheter was then used to perform an angiogram of the IMA. No active extravasation identified. Coaxial technique was then used to select the left colic artery and 2 separate sigmoidal arteries. In serial fashion the left colic artery was injected in order to perform a dedicated angiogram closer to the likely location of bleeding and no active extravasation identified. The microcatheter and guidewire were then used to selectively cannulate the first of 2 sigmoidal arteries. An angiogram of the first sigmoidal artery demonstrated no active bleeding. The catheter and guidewire were again used to selectively cannulate the second sigmoidal artery and a second angiogram of the sigmoidal arteries was performed also demonstrating no active  extravasation. In order to demonstrate no further cross-filling, the superior rectal artery was then cannulated with the microcatheter and a separate superior rectal artery angiogram was performed also demonstrating no active bleeding. All catheters and guidewires removed from the patient. Access was closed using Angio-Seal closure device. IMPRESSION: The IMA and its branches were freely investigated with angiograms and no active  extravasation identified. No further plans for intervention. Electronically Signed   By: Cordella Banner   On: 01/02/2024 16:31   IR Angiogram Selective Each Additional Vessel Result Date: 01/02/2024 INDICATION: Acute GI bleed in a patient with bloody stool. CTA of the abdomen demonstrates active bleeding likely secondary to diverticulosis in the sigmoid colon. EXAM: Pelvic angiogram ANESTHESIA/SEDATION: Moderate (conscious) sedation was employed during this procedure. A total of Versed  1.5 mg and Fentanyl  75 mcg was administered intravenously by the radiology nurse. Total intra-service moderate Sedation Time: 30 minutes. The patient's level of consciousness and vital signs were monitored continuously by radiology nursing throughout the procedure under my direct supervision. CONTRAST:  53 mL Omnipaque  300 FLUOROSCOPY: Radiation Exposure Index (as provided by the fluoroscopic device): 3.8 minutes mGy Kerma COMPLICATIONS: None immediate. PROCEDURE: Informed consent was obtained from the patient following explanation of the procedure, risks, benefits and alternatives. The patient understands, agrees and consents for the procedure. All questions were addressed. A time out was performed prior to the initiation of the procedure. Maximal barrier sterile technique utilized including caps, mask, sterile gowns, sterile gloves, large sterile drape, hand hygiene, and Betadine  prep. Ultrasound demonstrated the right common femoral artery to be anechoic and pulsatile indicating patency. The  arteriotomy site was anesthetized with 1% lidocaine . Small incision was made at the arteriotomy site and blunt dissection was then applied. The needle was advanced from the incision to the midpoint of the artery under ultrasound guidance. Final image was obtained and stored in patient's permanent medical record. Access was exchanged over an 018 guidewire for micropuncture sheath. Access was exchanged over an 035 wire for a 6 [place] sheath. Guidewire and catheter were then advanced to the abdominal aorta. The Sim 1 catheter tip was then reformed and used to selectively cannulate the IMA based on anatomic features. Catheter was then used to perform an angiogram of the IMA. No active extravasation identified. Coaxial technique was then used to select the left colic artery and 2 separate sigmoidal arteries. In serial fashion the left colic artery was injected in order to perform a dedicated angiogram closer to the likely location of bleeding and no active extravasation identified. The microcatheter and guidewire were then used to selectively cannulate the first of 2 sigmoidal arteries. An angiogram of the first sigmoidal artery demonstrated no active bleeding. The catheter and guidewire were again used to selectively cannulate the second sigmoidal artery and a second angiogram of the sigmoidal arteries was performed also demonstrating no active extravasation. In order to demonstrate no further cross-filling, the superior rectal artery was then cannulated with the microcatheter and a separate superior rectal artery angiogram was performed also demonstrating no active bleeding. All catheters and guidewires removed from the patient. Access was closed using Angio-Seal closure device. IMPRESSION: The IMA and its branches were freely investigated with angiograms and no active extravasation identified. No further plans for intervention. Electronically Signed   By: Cordella Banner   On: 01/02/2024 16:31   IR Angiogram  Selective Each Additional Vessel Result Date: 01/02/2024 INDICATION: Acute GI bleed in a patient with bloody stool. CTA of the abdomen demonstrates active bleeding likely secondary to diverticulosis in the sigmoid colon. EXAM: Pelvic angiogram ANESTHESIA/SEDATION: Moderate (conscious) sedation was employed during this procedure. A total of Versed  1.5 mg and Fentanyl  75 mcg was administered intravenously by the radiology nurse. Total intra-service moderate Sedation Time: 30 minutes. The patient's level of consciousness and vital signs were monitored continuously by radiology nursing throughout the procedure under my  direct supervision. CONTRAST:  53 mL Omnipaque  300 FLUOROSCOPY: Radiation Exposure Index (as provided by the fluoroscopic device): 3.8 minutes mGy Kerma COMPLICATIONS: None immediate. PROCEDURE: Informed consent was obtained from the patient following explanation of the procedure, risks, benefits and alternatives. The patient understands, agrees and consents for the procedure. All questions were addressed. A time out was performed prior to the initiation of the procedure. Maximal barrier sterile technique utilized including caps, mask, sterile gowns, sterile gloves, large sterile drape, hand hygiene, and Betadine  prep. Ultrasound demonstrated the right common femoral artery to be anechoic and pulsatile indicating patency. The arteriotomy site was anesthetized with 1% lidocaine . Small incision was made at the arteriotomy site and blunt dissection was then applied. The needle was advanced from the incision to the midpoint of the artery under ultrasound guidance. Final image was obtained and stored in patient's permanent medical record. Access was exchanged over an 018 guidewire for micropuncture sheath. Access was exchanged over an 035 wire for a 6 [place] sheath. Guidewire and catheter were then advanced to the abdominal aorta. The Sim 1 catheter tip was then reformed and used to selectively cannulate the  IMA based on anatomic features. Catheter was then used to perform an angiogram of the IMA. No active extravasation identified. Coaxial technique was then used to select the left colic artery and 2 separate sigmoidal arteries. In serial fashion the left colic artery was injected in order to perform a dedicated angiogram closer to the likely location of bleeding and no active extravasation identified. The microcatheter and guidewire were then used to selectively cannulate the first of 2 sigmoidal arteries. An angiogram of the first sigmoidal artery demonstrated no active bleeding. The catheter and guidewire were again used to selectively cannulate the second sigmoidal artery and a second angiogram of the sigmoidal arteries was performed also demonstrating no active extravasation. In order to demonstrate no further cross-filling, the superior rectal artery was then cannulated with the microcatheter and a separate superior rectal artery angiogram was performed also demonstrating no active bleeding. All catheters and guidewires removed from the patient. Access was closed using Angio-Seal closure device. IMPRESSION: The IMA and its branches were freely investigated with angiograms and no active extravasation identified. No further plans for intervention. Electronically Signed   By: Cordella Banner   On: 01/02/2024 16:31   IR Angiogram Selective Each Additional Vessel Result Date: 01/02/2024 INDICATION: Acute GI bleed in a patient with bloody stool. CTA of the abdomen demonstrates active bleeding likely secondary to diverticulosis in the sigmoid colon. EXAM: Pelvic angiogram ANESTHESIA/SEDATION: Moderate (conscious) sedation was employed during this procedure. A total of Versed  1.5 mg and Fentanyl  75 mcg was administered intravenously by the radiology nurse. Total intra-service moderate Sedation Time: 30 minutes. The patient's level of consciousness and vital signs were monitored continuously by radiology nursing  throughout the procedure under my direct supervision. CONTRAST:  53 mL Omnipaque  300 FLUOROSCOPY: Radiation Exposure Index (as provided by the fluoroscopic device): 3.8 minutes mGy Kerma COMPLICATIONS: None immediate. PROCEDURE: Informed consent was obtained from the patient following explanation of the procedure, risks, benefits and alternatives. The patient understands, agrees and consents for the procedure. All questions were addressed. A time out was performed prior to the initiation of the procedure. Maximal barrier sterile technique utilized including caps, mask, sterile gowns, sterile gloves, large sterile drape, hand hygiene, and Betadine  prep. Ultrasound demonstrated the right common femoral artery to be anechoic and pulsatile indicating patency. The arteriotomy site was anesthetized with 1% lidocaine . Small incision  was made at the arteriotomy site and blunt dissection was then applied. The needle was advanced from the incision to the midpoint of the artery under ultrasound guidance. Final image was obtained and stored in patient's permanent medical record. Access was exchanged over an 018 guidewire for micropuncture sheath. Access was exchanged over an 035 wire for a 6 [place] sheath. Guidewire and catheter were then advanced to the abdominal aorta. The Sim 1 catheter tip was then reformed and used to selectively cannulate the IMA based on anatomic features. Catheter was then used to perform an angiogram of the IMA. No active extravasation identified. Coaxial technique was then used to select the left colic artery and 2 separate sigmoidal arteries. In serial fashion the left colic artery was injected in order to perform a dedicated angiogram closer to the likely location of bleeding and no active extravasation identified. The microcatheter and guidewire were then used to selectively cannulate the first of 2 sigmoidal arteries. An angiogram of the first sigmoidal artery demonstrated no active bleeding. The  catheter and guidewire were again used to selectively cannulate the second sigmoidal artery and a second angiogram of the sigmoidal arteries was performed also demonstrating no active extravasation. In order to demonstrate no further cross-filling, the superior rectal artery was then cannulated with the microcatheter and a separate superior rectal artery angiogram was performed also demonstrating no active bleeding. All catheters and guidewires removed from the patient. Access was closed using Angio-Seal closure device. IMPRESSION: The IMA and its branches were freely investigated with angiograms and no active extravasation identified. No further plans for intervention. Electronically Signed   By: Cordella Banner   On: 01/02/2024 16:31   CT ANGIO GI BLEED Result Date: 01/02/2024 CLINICAL DATA:  Acute Lower GI bleed EXAM: CTA ABDOMEN AND PELVIS WITHOUT AND WITH CONTRAST TECHNIQUE: Initially, a noncontrast CT of the abdomen and pelvis was performed. Subsequently, multidetector CT imaging of the abdomen and pelvis was performed using the standard protocol during bolus administration of intravenous contrast. Multiplanar reconstructed images and MIPs were obtained and reviewed to evaluate the vascular anatomy. RADIATION DOSE REDUCTION: This exam was performed according to the departmental dose-optimization program which includes automated exposure control, adjustment of the mA and/or kV according to patient size and/or use of iterative reconstruction technique. CONTRAST:  OMNIPAQUE  IOHEXOL  350 MG/ML SOLN COMPARISON:  None Available. FINDINGS: VASCULAR Aorta: No aortic aneurysm or dissection. Calcified and noncalcified plaque scattered throughout the aorta. No hemodynamically significant stenosis. Celiac: Patent without acute thrombus, aneurysm, or dissection.No hemodynamically significant stenosis. SMA: Patent without acute thrombus, aneurysm, or dissection.Mild-to-moderate narrowing of the proximal SMA from  noncalcified plaque. Renals: Patent without acute thrombus, aneurysm, or dissection.Severe narrowing of the right renal artery ostium from mixed density plaque. IMA: Patent without acute thrombus, aneurysm, or dissection.No hemodynamically significant stenosis. Inflow: Patent without acute thrombus, aneurysm, or dissection.No hemodynamically significant stenosis. Proximal Outflow: The bilateral common femoral and visualized portions of the superficial and profunda femoral arteries are patent without acute thrombus, aneurysm, or dissection.No hemodynamically significant stenosis. Veins: No obvious venous abnormality within the limitations of this arterial phase study. Review of the MIP images confirms the above findings. NON-VASCULAR Lower chest: No focal airspace consolidation or pleural effusion. Hepatobiliary: The most superior aspects of the right and left hepatic lobes are excluded from field of view. No mass.Diffuse hepatic steatosis.No radiopaque stones or wall thickening of the gallbladder.No intrahepatic or extrahepatic biliary ductal dilation. Pancreas: No mass or main ductal dilation.No peripancreatic inflammation or fluid collection.  Spleen: Normal size. No mass. Adrenals/Urinary Tract: Nodular thickening within both adrenal glands, more so on the left, without discrete mass or dominant nodule. Subcentimeter hypodensities are noted in the kidneys, too small to definitively characterize, but likely small cysts. No hydronephrosis or nephrolithiasis. The urinary bladder is distended without focal abnormality. Stomach/Bowel:Small hiatal hernia. The stomach is decompressed without focal abnormality. No small bowel wall thickening or inflammation. No small bowel obstruction.Normal appendix. Descending and sigmoid colonic diverticulosis. No changes of acute diverticulitis. GI Bleed: Active diverticular bleeding from the distal sigmoid colon. Lymphatic: No intraabdominal or pelvic lymphadenopathy. Reproductive:  Age-related atrophy of the uterus and ovaries. No concerning adnexal mass.No free pelvic fluid. Other: No pneumoperitoneum, ascites, or mesenteric inflammation. Musculoskeletal: No acute fracture or destructive lesion.Diffuse osteopenia. Multilevel degenerative disc disease of the spine. IMPRESSION: VASCULAR No aortic aneurysm or aortic dissection. NON-VASCULAR 1. Active diverticular bleeding from the distal sigmoid colon. GI and surgical consultation recommended. 2. Descending and sigmoid colonic diverticulosis. No changes of acute diverticulitis. 3. Diffuse hepatic steatosis. Critical Value/emergent results were called by telephone at the time of interpretation on 01/02/2024 at 11:03 am to provider Soyla Body, MD, who verbally acknowledged these results. Electronically Signed   By: Rogelia Myers M.D.   On: 01/02/2024 11:07     Procedures   Medications Ordered in the ED  acetaminophen  (TYLENOL ) tablet 500 mg (500 mg Oral Not Given 01/02/24 1307)  acetaminophen  (TYLENOL ) tablet 650 mg (has no administration in time range)  amLODipine  (NORVASC ) tablet 2.5 mg (2.5 mg Oral Not Given 01/02/24 1307)  ALPRAZolam  (XANAX ) tablet 0.25 mg (has no administration in time range)  cholecalciferol (VITAMIN D3) 25 MCG (1000 UNIT) tablet 5,000 Units (5,000 Units Oral Not Given 01/02/24 1307)  0.9 %  sodium chloride  infusion ( Intravenous New Bag/Given 01/02/24 1326)  sodium chloride  flush (NS) 0.9 % injection 3 mL (3 mLs Intravenous Not Given 01/02/24 1308)  sodium chloride  flush (NS) 0.9 % injection 3 mL (has no administration in time range)  0.9 %  sodium chloride  infusion (has no administration in time range)  polyethylene glycol (MIRALAX / GLYCOLAX) packet 17 g (has no administration in time range)  ondansetron  (ZOFRAN ) tablet 4 mg (has no administration in time range)    Or  ondansetron  (ZOFRAN ) injection 4 mg (has no administration in time range)  hydrALAZINE  (APRESOLINE ) injection 5 mg (has no  administration in time range)  pantoprazole (PROTONIX) injection 40 mg (40 mg Intravenous Given 01/02/24 1327)  insulin  aspart (novoLOG ) injection 0-9 Units (2 Units Subcutaneous Given 01/02/24 1729)  iohexol  (OMNIPAQUE ) 350 MG/ML injection 100 mL (100 mLs Intravenous Contrast Given 01/02/24 1004)  midazolam  (VERSED ) injection (0.5 mg Intravenous Given 01/02/24 1507)  fentaNYL  (SUBLIMAZE ) injection (25 mcg Intravenous Given 01/02/24 1507)  lidocaine -EPINEPHrine  (XYLOCAINE  W/EPI) 1 %-1:100000 (with pres) injection 20 mL (20 mLs Intradermal Given 01/02/24 1503)  iohexol  (OMNIPAQUE ) 300 MG/ML solution 100 mL (28 mLs Intravenous Contrast Given 01/02/24 1518)  iohexol  (OMNIPAQUE ) 300 MG/ML solution 100 mL (25 mLs Intra-arterial Contrast Given 01/02/24 1518)      83 y.o. female presents to the ED with complaints of rectal bleed, this involves an extensive number of treatment options, and is a complaint that carries with it a high risk of complications and morbidity.  The differential diagnosis includes hemorrhoids, thrombosed hemorrhoid lower GI bleed, upper GI bleed, gastric ulcer, anal fistula, (Ddx)  On arrival pt is nontoxic, vitals hypertensive and tachycardic on initial exam.   Additional history obtained from followed by [facility] for GERD.  She has prescribed Nexium  daily.  Lab Tests:  I Ordered, reviewed, and interpreted labs, which included: CMP, CBC, PT/INR, APTT, Hemoccult  Imaging Studies ordered:  I ordered imaging studies which included CT angio GI bleed, I independently visualized and interpreted imaging which showed active diverticular bleed in the distal sigmoid colon  ED Course:   83 year old female presents to the ED with complaints of bright red rectal bleeding since 5 AM this morning.  Patient is sitting comfortably on ED bed nontoxic-appearing.  On initial exam patient is mildly hypertensive with history, patient reports not taking any medication today.  Patient is on  Plavix  per cardio recommendation.  Patient denies any pain.  Lungs are clear to auscultation in all fields.  No pain to palpation throughout the abdomen.  Patient denies any current dizziness or headache.  Patient denies any history of anemia and reports she has not been feeling weak lately or increased sensation of fatigue.  Patient denies any nausea or vomiting and does not report any melena.  On rectal exam RN was present for entire exam.  There was noted dried red blood around the rectum with several external hemorrhoids.  No visualized hemorrhoids were thrombosed.  There were no active bleeding hemorrhoids noted either.  Patient has good rectal tone and there was no pain on exam.  Bright red blood was noted inside the rectum.  No obvious masses were felt or pain throughout rectal canal.  CT angio for GI bleed will be ordered.  Patient has a reported iodine  allergy but advises it was several years ago and she had a mild skin reaction to a topical iodine .  She reports she has had CT scans since without complication.  Allergy was updated in chart.  CT resulted in active diverticular bleed in the distal sigmoid colon.  Patient CBC was reevaluated at this time with the decreased hemoglobin at 10.5 from initial 11.6.  CT resulted in active diverticular bleed in the distal sigmoid colon.  GI was consulted and recommended to consult IR for further management.  Dr. Jenna advised that the patient needs to be scoped first before he would need to do any intervention.  He reports it is a challenging place to cauterize the bowel without causing infarct.  GI will be consulted again with recommendations from IR.   Vina Dasen NP was re consulted and advised of IR recommendations.  She advised that they will consult.  Patient will be admitted to medicine awaiting GI consult.  Patient was reevaluated at this time and sitting comfortably in ED bed with no new complaints.  Patient was advised of findings and  agreed with treatment plan.  Dr. Sonjia was consulted for admission.  Patient was admitted at this time awaiting GI consult.  Patient care was transferred at this time.  Portions of this note were generated with Scientist, clinical (histocompatibility and immunogenetics). Dictation errors may occur despite best attempts at proofreading.   Final diagnoses:  Acute lower GI bleeding    ED Discharge Orders     None          Myriam Fonda GORMAN DEVONNA 01/02/24 2019    Francesca Elsie CROME, MD 01/03/24 1505

## 2024-01-02 NOTE — H&P (Addendum)
 [facility] History and Physical  Brittany Mcclure FMW:[id number] DOB: 1940/08/15 DOA: 01/02/2024  Referring physician: ED  PCP: Mast, Man X, NP   Patient is coming from: Home  Chief Complaint: Bright red blood per rectum  HPI:   83 year old female with past medical history of coronary artery disease on dual antiplatelets, CKD stage IIIa, hypertension, hyperlipidemia, GERD and gastric ulcer presented to the hospital with complaints of bright red blood per rectum since 5 AM.  When she woke up she felt like she was going to pass gas when she had a large amount of blood bright red blood  in the toilet bowl.  Patient on dual antiplatelets for CAD not amenable to stenting but does have a history of hemorrhoids been controlled with Preparation H and takes medication for gastric ulceration.  Denies any NSAIDs except aspirin .  Patient denied any nausea or vomiting.  Has mild abdominal discomfort but denies any chest pain, shortness of breath or dyspnea.  She did have some dizziness and felt like she might pass out but did not have syncope.  Denies any urinary urgency, frequency or dysuria.  Denies any sick contacts or travel.  Denies any cough fever or chills.    In the ED, patient had stable vitals.  Labs were notable for hemoglobin of 11.6.  BMP with creatinine of 1.1.  Has leukocytosis with WBC at 14.1.  INR of 1.0.  FOBT was positive.  CTA of the abdomen (GI bleed scan) was positive for acute diverticular bleeding from the distal sigmoid colon and GI consultation was recommended.  IR was also notified and patient was considered for admission to the hospital for further evaluation and treatment.   Assessment and Plan Principal Problem:   GI bleed  Acute diverticular bleeding.   Status post CTA of the abdomen.  GI on board  on board.  Keep n.p.o. IV fluids supportive care.  IR has been consulted as well for IR guided embolization..  Will follow recommendations.  Continue to hold aspirin  and  Plavix .  Will hold Prozac  for now.  Spoke with interventional radiology at bedside.  My leukocytosis likely reactive.  Continue to monitor.  History of GERD gastric ulcer.  Continue IV PPI.  Patient takes Nexium  at home including sucralfate .  Diabetes mellitus type 2 on glipizide  at home.  Will hold for now.  Add sliding scale insulin .  Hypertension Continue amlodipine   Chronic kidney disease stage IIIa.  Will continue to monitor renal function   History of CAD.  Was on aspirin  and Plavix .  Currently on hold.  Not a candidate for stent placement..  No active issues at this time.  Class II obesity.  Body mass index is 37.79 kg/m. Might benefit from ongoing lifestyle modification and weight loss as outpatient.  DVT Prophylaxis: SCD  Review of Systems:  All systems were reviewed and were negative unless otherwise mentioned in the HPI   Past Medical History:  Diagnosis Date   Allergy    Anxiety    Arthritis    with gout   Carotid bruit    right   Cataract    bil cataracts removed   Chest pain    Chronic kidney disease    Colon polyps    Depression    Diverticulosis    Duodenitis    Exogenous obesity    Gastric ulcer    GERD (gastroesophageal reflux disease)    Hyperlipidemia    Hypertension    Metabolic syndrome    Osteopenia  Seasonal allergies    seasonal   Tubular adenoma of colon    Type 2 diabetes mellitus (HCC)    Past Surgical History:  Procedure Laterality Date   COLONOSCOPY     11/90,11/91,1994,2000,2010   CORONARY PRESSURE/FFR STUDY N/A 08/31/2023   Procedure: CORONARY PRESSURE/FFR STUDY;  Surgeon: Mady Bruckner, MD;  Location: [facility];  Service: Cardiovascular;  Laterality: N/A;   DILATION AND CURETTAGE OF UTERUS  1997   FOOT SURGERY     x 2   HAND SURGERY Right    Carpel Tunnel   HYSTEROSCOPY WITH D & C N/A 11/06/2023   Procedure: DILATATION AND CURETTAGE /HYSTEROSCOPY;  Surgeon: Cleotilde Ronal RAMAN, MD;  Location: [facility] OR;  Service:  Gynecology;  Laterality: N/A;   LAPAROSCOPIC UNILATERAL SALPINGO OOPHERECTOMY Left 11/06/2023   Procedure: SALPINGO-OOPHORECTOMY, BILATERAL, LAPAROSCOPIC;  Surgeon: Cleotilde Ronal RAMAN, MD;  Location: [facility] OR;  Service: Gynecology;  Laterality: Left;  laparoscopic left salpingoophrectomy, Possible right salpingoophrectomy,   LEFT HEART CATH AND CORONARY ANGIOGRAPHY N/A 08/31/2023   Procedure: LEFT HEART CATH AND CORONARY ANGIOGRAPHY;  Surgeon: Mady Bruckner, MD;  Location: [facility];  Service: Cardiovascular;  Laterality: N/A;   POLYPECTOMY     TUBAL LIGATION      Social History:  reports that she has quit smoking. She has never been exposed to tobacco smoke. She has never used smokeless tobacco. She reports current alcohol use. She reports that she does not use drugs.  Allergies  Allergen Reactions   Statins Other (See Comments)    Muscle Pain and swollen   Sulfa Antibiotics Nausea And Vomiting    Made infection worse    Zetia  [Ezetimibe ]     Cough, dry    Family History  Problem Relation Age of Onset   Colon cancer Mother    Stroke Father    Hypertension Father    Heart failure Maternal Grandmother    Diabetes Maternal Grandmother        ?   Rectal cancer Neg Hx    Stomach cancer Neg Hx    Esophageal cancer Neg Hx    Pancreatic cancer Neg Hx      Prior to Admission medications   Medication Sig Start Date End Date Taking? Authorizing Provider  acetaminophen  (TYLENOL ) 500 MG tablet Take 500 mg by mouth every morning.   Yes [provider]  acetaminophen  (TYLENOL ) 650 MG CR tablet Take 650 mg by mouth at bedtime.   Yes [provider]  ALPRAZolam  (XANAX ) 0.25 MG tablet TAKE ONE TABLET BY MOUTH TWICE DAILY AS NEEDED Patient taking differently: Take 0.25 mg by mouth daily. 09/25/23  Yes Mast, Man X, NP  amLODipine  (NORVASC ) 2.5 MG tablet Take 1 tablet (2.5 mg total) by mouth daily. 08/31/23 08/30/24 Yes End, Bruckner, MD  aspirin  EC 81 MG tablet Take 1  tablet (81 mg total) by mouth daily. Swallow whole. 08/28/23  Yes Chandrasekhar, Mahesh A, MD  Cholecalciferol (VITAMIN D3) 125 MCG (5000 UT) TABS Take 5,000 Units by mouth daily.   Yes [provider]  clopidogrel  (PLAVIX ) 75 MG tablet Take 1 tablet (75 mg total) by mouth daily. 07/17/23  Yes Thukkani, Arun K, MD  esomeprazole  (NEXIUM ) 40 MG capsule Take 1 capsule (40 mg total) by mouth daily. 11/02/23  Yes [place], PA-C  FLUoxetine  (PROZAC ) 40 MG capsule TAKE ONE CAPSULE BY MOUTH EVERY DAY. 10/30/23  Yes Mast, Man X, NP  glipiZIDE  (GLUCOTROL ) 5 MG tablet Take 1 tablet (5 mg  total) by mouth 2 (two) times daily before a meal. 10/30/23  Yes Mast, Man X, NP  Glycerin-Hypromellose-PEG 400 (DRY EYE RELIEF DROPS OP) Place 1 drop into both eyes daily as needed (Dry eye).   Yes [provider]  Multiple Vitamins-Minerals (PRESERVISION AREDS PO) Take 1 tablet by mouth in the morning and at bedtime.   Yes [provider]  Omega-3 Fatty Acids (FISH OIL) 1000 MG CAPS Take 1,000 mg by mouth daily.   Yes [provider]  sucralfate  (CARAFATE ) 1 g tablet Take 1 tablet (1 g total) by mouth 3 (three) times daily as needed. 11/02/23 04/30/24 Yes [place], PA-C  triamcinolone  cream (KENALOG ) 0.1 % Apply 1 Application topically 2 (two) times daily. Apply 2 times daily to the back for up to 2 weeks then STOP, Resume in 2 weeks if irritation is not resolved Patient taking differently: Apply 1 Application topically as needed (skin irritation). Apply 2 times daily to the back for up to 2 weeks then STOP, Resume in 2 weeks if irritation is not resolved 06/13/23  Yes Alm Delon SAILOR, DO  Accu-Chek FastClix Lancets MISC USE AS DIRECTED. 04/18/22   Mast, Man X, NP  glucose blood (ACCU-CHEK SMARTVIEW) test strip CHECK BLOOD SUGAR 3 TIMES DAILY AS DIRECTED. 04/18/22   Mast, Man X, NP    Physical Exam:  Vitals:   01/02/24 1030 01/02/24 1042 01/02/24 1100 01/02/24 1130  BP: 128/63  136/64  139/64  Pulse: 97  98   Resp: 18  18 17   Temp:  98 F (36.7 C)    TempSrc:  Oral    SpO2: 99%  100%   Weight:      Height:       Wt Readings from Last 3 Encounters:  01/02/24 90.7 kg  11/06/23 90.7 kg  11/02/23 91.2 kg   Body mass index is 37.79 kg/m.  General:  Average built, not in obvious distress elderly female, obese built HENT: Normocephalic, mild pallor noted oral mucosa is moist.  Chest:  Clear breath sounds.  . No crackles or wheezes.  CVS: S1 &S2 heard. No murmur.  Regular rate and rhythm. Abdomen: Soft, nonspecific tenderness on palpation nondistended.  Bowel sounds are heard. No abdominal mass palpated Extremities: No cyanosis, clubbing or edema.  Peripheral pulses are palpable. Psych: Alert, awake and oriented, normal mood CNS:  No cranial nerve deficits.  Power equal in all extremities.   Skin: Warm and dry.  No rashes noted.  Labs on Admission:   CBC: Recent Labs  Lab 01/02/24 0726 01/02/24 1104  WBC 10.4 14.1*  NEUTROABS 7.9*  --   HGB 11.6* 10.5*  HCT 35.9* 32.8*  MCV 90.0 90.9  PLT 288 255    Basic Metabolic Panel: Recent Labs  Lab 01/02/24 0726  NA 139  K 4.5  CL 105  CO2 23  GLUCOSE 142*  BUN 26*  CREATININE 1.14*  CALCIUM 9.0    Liver Function Tests: Recent Labs  Lab 01/02/24 0726  AST 20  ALT 15  ALKPHOS 108  BILITOT 0.7  PROT 6.3*  ALBUMIN 3.2*   No results for input(s): LIPASE, AMYLASE in the last 168 hours. No results for input(s): AMMONIA in the last 168 hours.  Cardiac Enzymes: No results for input(s): CKTOTAL, CKMB, CKMBINDEX, TROPONINI in the last 168 hours.  BNP (last 3 results) No results for input(s): BNP in the last 8760 hours.  ProBNP (last 3 results) No results for input(s): PROBNP in the last  8760 hours.  CBG: No results for input(s): GLUCAP in the last 168 hours.  Lipase     Component Value Date/Time   LIPASE 48.0 03/29/2023 1105     Urinalysis    Component Value  Date/Time   BILIRUBINUR n 02/22/2016 1353   PROTEINUR n 02/22/2016 1353   UROBILINOGEN 0.2 02/22/2016 1353   NITRITE n 02/22/2016 1353   LEUKOCYTESUR Trace (A) 02/22/2016 1353     Drugs of Abuse  No results found for: LABOPIA, COCAINSCRNUR, LABBENZ, AMPHETMU, THCU, LABBARB    Radiological Exams on Admission: CT ANGIO GI BLEED Result Date: 01/02/2024 CLINICAL DATA:  Acute Lower GI bleed EXAM: CTA ABDOMEN AND PELVIS WITHOUT AND WITH CONTRAST TECHNIQUE: Initially, a noncontrast CT of the abdomen and pelvis was performed. Subsequently, multidetector CT imaging of the abdomen and pelvis was performed using the standard protocol during bolus administration of intravenous contrast. Multiplanar reconstructed images and MIPs were obtained and reviewed to evaluate the vascular anatomy. RADIATION DOSE REDUCTION: This exam was performed according to the departmental dose-optimization program which includes automated exposure control, adjustment of the mA and/or kV according to patient size and/or use of iterative reconstruction technique. CONTRAST:  OMNIPAQUE  IOHEXOL  350 MG/ML SOLN COMPARISON:  None Available. FINDINGS: VASCULAR Aorta: No aortic aneurysm or dissection. Calcified and noncalcified plaque scattered throughout the aorta. No hemodynamically significant stenosis. Celiac: Patent without acute thrombus, aneurysm, or dissection.No hemodynamically significant stenosis. SMA: Patent without acute thrombus, aneurysm, or dissection.Mild-to-moderate narrowing of the proximal SMA from noncalcified plaque. Renals: Patent without acute thrombus, aneurysm, or dissection.Severe narrowing of the right renal artery ostium from mixed density plaque. IMA: Patent without acute thrombus, aneurysm, or dissection.No hemodynamically significant stenosis. Inflow: Patent without acute thrombus, aneurysm, or dissection.No hemodynamically significant stenosis. Proximal Outflow: The bilateral common femoral and  visualized portions of the superficial and profunda femoral arteries are patent without acute thrombus, aneurysm, or dissection.No hemodynamically significant stenosis. Veins: No obvious venous abnormality within the limitations of this arterial phase study. Review of the MIP images confirms the above findings. NON-VASCULAR Lower chest: No focal airspace consolidation or pleural effusion. Hepatobiliary: The most superior aspects of the right and left hepatic lobes are excluded from field of view. No mass.Diffuse hepatic steatosis.No radiopaque stones or wall thickening of the gallbladder.No intrahepatic or extrahepatic biliary ductal dilation. Pancreas: No mass or main ductal dilation.No peripancreatic inflammation or fluid collection. Spleen: Normal size. No mass. Adrenals/Urinary Tract: Nodular thickening within both adrenal glands, more so on the left, without discrete mass or dominant nodule. Subcentimeter hypodensities are noted in the kidneys, too small to definitively characterize, but likely small cysts. No hydronephrosis or nephrolithiasis. The urinary bladder is distended without focal abnormality. Stomach/Bowel:Small hiatal hernia. The stomach is decompressed without focal abnormality. No small bowel wall thickening or inflammation. No small bowel obstruction.Normal appendix. Descending and sigmoid colonic diverticulosis. No changes of acute diverticulitis. GI Bleed: Active diverticular bleeding from the distal sigmoid colon. Lymphatic: No intraabdominal or pelvic lymphadenopathy. Reproductive: Age-related atrophy of the uterus and ovaries. No concerning adnexal mass.No free pelvic fluid. Other: No pneumoperitoneum, ascites, or mesenteric inflammation. Musculoskeletal: No acute fracture or destructive lesion.Diffuse osteopenia. Multilevel degenerative disc disease of the spine. IMPRESSION: VASCULAR No aortic aneurysm or aortic dissection. NON-VASCULAR 1. Active diverticular bleeding from the distal  sigmoid colon. GI and surgical consultation recommended. 2. Descending and sigmoid colonic diverticulosis. No changes of acute diverticulitis. 3. Diffuse hepatic steatosis. Critical Value/emergent results were called by telephone at the time of interpretation on 01/02/2024 at  11:03 am to provider Soyla Body, MD, who verbally acknowledged these results. Electronically Signed   By: Rogelia Myers M.D.   On: 01/02/2024 11:07    EKG: Personally reviewed by me which shows normal sinus rhythm   Consultant:  GI Interventional radiology  Code Status: DNR  Microbiology none  Antibiotics: None none  Family Communication:  Patients' condition and plan of care including tests being ordered have been discussed with the patient  who indicate understanding and agree with the plan.  I tried to reach out to the patient's daughter but was unable to reach out to her.   Status is: Inpatient   Severity of Illness: The appropriate patient status for this patient is INPATIENT. Inpatient status is judged to be reasonable and necessary in order to provide the required intensity of service to ensure the patient's safety. The patient's presenting symptoms, physical exam findings, and initial radiographic and laboratory data in the context of their chronic comorbidities is felt to place them at high risk for further clinical deterioration. Furthermore, it is not anticipated that the patient will be medically stable for discharge from the hospital within 2 midnights of admission.   * I certify that at the point of admission it is my clinical judgment that the patient will require inpatient hospital care spanning beyond 2 midnights from the point of admission due to high intensity of service, high risk for further deterioration and high frequency of surveillance required.*  Signed, Vernal Alstrom, MD [facility] 01/02/2024

## 2024-01-02 NOTE — Consult Note (Addendum)
 Consultation Note   Referring Provider:   [facility] PCP: Mast, Man X, NP Primary Gastroenterologist:  Gordy Starch, MD     Reason for Consultation:  Lower GI bleed DOA: 01/02/2024         Hospital Day: 1   Attending physician's note  I personally saw the patient and performed a substantive portion of the medical decision making process for this encounter (including a complete performance of the key components : MDM, Hx and Exam), in conjunction with the APP.  I agree with the APP's note, impression, and  the management plan for the number and complexity of problems addressed at the encounter for the patient and take responsibility for that plan with its inherent risk of complications, morbidity, or mortality with additional input as follows.    83 year old female presented to the ER with acute lower GI bleed, she had multiple episodes of large-volume hematochezia and had a syncopal event while on toilet in the ER with bleeding episode  Presentation concerning for acute diverticular hemorrhage, CT angio confirmed active extravasation in sigmoid colon consistent with acute diverticular hemorrhage  She underwent hysterectomy with bilateral salpingo-oophorectomy for endometrial polyps and cystic lesion on fallopian tubes on November 06, 2023, adnexal cystic lesions on the left side were adherent to the sigmoid colon with significant diverticular disease, she has been experiencing intermittent left lower quadrant abdominal discomfort in the past few weeks  She is on chronic Plavix , last dose was yesterday Monitor hemoglobin and transfuse if below 7  Plan for embolization by IR this afternoon  If unsuccessful by IR and continues to have persistent bleeding, will plan for colonoscopy with endoscopic intervention  The patient was provided an opportunity to ask questions and all were answered. The patient agreed with the plan and demonstrated an  understanding of the instructions.  LOIS Wilkie Mcgee , MD [phone]    ASSESSMENT    83 yo female with hemodynamically unstable sigmoid diverticular bleeding on plavix .  CT angio demonstrating active sigmoid diverticular bleed. Patient has become progressively unstable with syncopal episode, soft BP.   Acute blood loss anemia.  Presenting hemoglobin 11.6 ) baseline 12.4).  Hemoglobin down to 10.5 in the setting of ongoing bleeding but also IV fluids  CKD 3a  History of CAD Takes Plavix  (on hold) and aspirin .  Last dose of Plavix  was 01/01/2024  History of colon polyps  Type 2 diabetes  Hypertension  Chronic / stable GI history :  Chronic GERD -takes daily Nexium  at home Hiatal hernia History of erosive gastritis , PUD ( non-H.pylori related)  Hepatic steatosis- normal LFTs  See [facility] for any additional medical history  / medical problems  Principal Problem:   GI bleed   PLAN:   --250 cc NS bolus now --IR taking patient for embolization --Monitor H/H -- Daily pantoprazole for GERD  HPI   Ms. Robison was brought to ED today today for evaluation of lower GI bleeding. Today she thought she had to pass gas but passed a large amount of red blood. She was brought by EMS to ED. In ED she was mildly tachycardic, othewise hemodynamically stable . Presenting hgb ~ a gram lower than baseline. Labs otherwise reassuring. CT angio positive for active  bleeding in sigmoid colon. Per EDP, Interventional Radiology evaluated for embolization but had concerns about risk for infarction if embolization attempted.  She has continued to have episodes of hematochezia in ED, one a few minutes ago with associated syncope. IR has decided to take her for embolization   Patient gives a history of lower GI bleeding several years ago in [place] .  Sounds like etiology was not determined.  She is up-to-date on screening colonoscopy.  Her last colonoscopy was in 2020 with removal of an inflammatory  polyp.  No future colonoscopies planned due to age  Notable studies:  WBC 10.4 >> 14K Hgb 11.6 ( baseline 12.4) >> 10.5 MCV 91 Platelets 255 INR 1 BUN 26 Cr 1.14  CT angio NON-VASCULAR 1. Active diverticular bleeding from the distal sigmoid colon. GI and surgical consultation recommended. 2. Descending and sigmoid colonic diverticulosis. No changes of acute diverticulitis. 3. Diffuse hepatic steatosis.   Pertinent GI Studies   March 2020 Colonoscopy - polyp surveillance - One 3 mm polyp in the ascending colon, removed with a cold snare. Resected and retrieved. - Diverticulosis in the sigmoid colon and in the descending colon. - The examination was otherwise normal on direct and retroflexion views.   Path:  Inflammatory polyp  Labs and Imaging:  Recent Labs    01/02/24 0726  PROT 6.3*  ALBUMIN 3.2*  AST 20  ALT 15  ALKPHOS 108  BILITOT 0.7   Recent Labs    01/02/24 0726 01/02/24 1104  WBC 10.4 14.1*  HGB 11.6* 10.5*  HCT 35.9* 32.8*  MCV 90.0 90.9  PLT 288 255   Recent Labs    01/02/24 0726  NA 139  K 4.5  CL 105  CO2 23  GLUCOSE 142*  BUN 26*  CREATININE 1.14*  CALCIUM 9.0     CT ANGIO GI BLEED CLINICAL DATA:  Acute Lower GI bleed  EXAM: CTA ABDOMEN AND PELVIS WITHOUT AND WITH CONTRAST  TECHNIQUE: Initially, a noncontrast CT of the abdomen and pelvis was performed. Subsequently, multidetector CT imaging of the abdomen and pelvis was performed using the standard protocol during bolus administration of intravenous contrast. Multiplanar reconstructed images and MIPs were obtained and reviewed to evaluate the vascular anatomy.  RADIATION DOSE REDUCTION: This exam was performed according to the departmental dose-optimization program which includes automated exposure control, adjustment of the mA and/or kV according to patient size and/or use of iterative reconstruction technique.  CONTRAST:  OMNIPAQUE  IOHEXOL  350 MG/ML  SOLN  COMPARISON:  None Available.  FINDINGS: VASCULAR  Aorta: No aortic aneurysm or dissection. Calcified and noncalcified plaque scattered throughout the aorta. No hemodynamically significant stenosis.  Celiac: Patent without acute thrombus, aneurysm, or dissection.No hemodynamically significant stenosis.  SMA: Patent without acute thrombus, aneurysm, or dissection.Mild-to-moderate narrowing of the proximal SMA from noncalcified plaque.  Renals: Patent without acute thrombus, aneurysm, or dissection.Severe narrowing of the right renal artery ostium from mixed density plaque.  IMA: Patent without acute thrombus, aneurysm, or dissection.No hemodynamically significant stenosis.  Inflow: Patent without acute thrombus, aneurysm, or dissection.No hemodynamically significant stenosis.  Proximal Outflow: The bilateral common femoral and visualized portions of the superficial and profunda femoral arteries are patent without acute thrombus, aneurysm, or dissection.No hemodynamically significant stenosis.  Veins: No obvious venous abnormality within the limitations of this arterial phase study.  Review of the MIP images confirms the above findings.  NON-VASCULAR  Lower chest: No focal airspace consolidation or pleural effusion.  Hepatobiliary: The most superior aspects of the right  and left hepatic lobes are excluded from field of view. No mass.Diffuse hepatic steatosis.No radiopaque stones or wall thickening of the gallbladder.No intrahepatic or extrahepatic biliary ductal dilation.  Pancreas: No mass or main ductal dilation.No peripancreatic inflammation or fluid collection.  Spleen: Normal size. No mass.  Adrenals/Urinary Tract: Nodular thickening within both adrenal glands, more so on the left, without discrete mass or dominant nodule. Subcentimeter hypodensities are noted in the kidneys, too small to definitively characterize, but likely small cysts.  No hydronephrosis or nephrolithiasis. The urinary bladder is distended without focal abnormality.  Stomach/Bowel:Small hiatal hernia. The stomach is decompressed without focal abnormality. No small bowel wall thickening or inflammation. No small bowel obstruction.Normal appendix. Descending and sigmoid colonic diverticulosis. No changes of acute diverticulitis.  GI Bleed: Active diverticular bleeding from the distal sigmoid colon.  Lymphatic: No intraabdominal or pelvic lymphadenopathy.  Reproductive: Age-related atrophy of the uterus and ovaries. No concerning adnexal mass.No free pelvic fluid.  Other: No pneumoperitoneum, ascites, or mesenteric inflammation.  Musculoskeletal: No acute fracture or destructive lesion.Diffuse osteopenia. Multilevel degenerative disc disease of the spine.  IMPRESSION: VASCULAR  No aortic aneurysm or aortic dissection.  NON-VASCULAR  1. Active diverticular bleeding from the distal sigmoid colon. GI and surgical consultation recommended. 2. Descending and sigmoid colonic diverticulosis. No changes of acute diverticulitis. 3. Diffuse hepatic steatosis.  Critical Value/emergent results were called by telephone at the time of interpretation on 01/02/2024 at 11:03 am to provider Soyla Body, MD, who verbally acknowledged these results.  Electronically Signed   By: Rogelia Myers M.D.   On: 01/02/2024 11:07    Past Medical History:  Diagnosis Date   Allergy    Anxiety    Arthritis    with gout   Carotid bruit    right   Cataract    bil cataracts removed   Chest pain    Chronic kidney disease    Colon polyps    Depression    Diverticulosis    Duodenitis    Exogenous obesity    Gastric ulcer    GERD (gastroesophageal reflux disease)    Hyperlipidemia    Hypertension    Metabolic syndrome    Osteopenia    Seasonal allergies    seasonal   Tubular adenoma of colon    Type 2 diabetes mellitus (HCC)     Past Surgical  History:  Procedure Laterality Date   COLONOSCOPY     11/90,11/91,1994,2000,2010   CORONARY PRESSURE/FFR STUDY N/A 08/31/2023   Procedure: CORONARY PRESSURE/FFR STUDY;  Surgeon: Mady Bruckner, MD;  Location: [facility];  Service: Cardiovascular;  Laterality: N/A;   DILATION AND CURETTAGE OF UTERUS  1997   FOOT SURGERY     x 2   HAND SURGERY Right    Carpel Tunnel   HYSTEROSCOPY WITH D & C N/A 11/06/2023   Procedure: DILATATION AND CURETTAGE /HYSTEROSCOPY;  Surgeon: Cleotilde Ronal RAMAN, MD;  Location: [facility] OR;  Service: Gynecology;  Laterality: N/A;   LAPAROSCOPIC UNILATERAL SALPINGO OOPHERECTOMY Left 11/06/2023   Procedure: SALPINGO-OOPHORECTOMY, BILATERAL, LAPAROSCOPIC;  Surgeon: Cleotilde Ronal RAMAN, MD;  Location: [facility] OR;  Service: Gynecology;  Laterality: Left;  laparoscopic left salpingoophrectomy, Possible right salpingoophrectomy,   LEFT HEART CATH AND CORONARY ANGIOGRAPHY N/A 08/31/2023   Procedure: LEFT HEART CATH AND CORONARY ANGIOGRAPHY;  Surgeon: Mady Bruckner, MD;  Location: [facility];  Service: Cardiovascular;  Laterality: N/A;   POLYPECTOMY     TUBAL LIGATION      Family History  Problem  Relation Age of Onset   Colon cancer Mother    Stroke Father    Hypertension Father    Heart failure Maternal Grandmother    Diabetes Maternal Grandmother        ?   Rectal cancer Neg Hx    Stomach cancer Neg Hx    Esophageal cancer Neg Hx    Pancreatic cancer Neg Hx     Prior to Admission medications   Medication Sig Start Date End Date Taking? Authorizing Provider  acetaminophen  (TYLENOL ) 500 MG tablet Take 500 mg by mouth every morning.   Yes [provider]  acetaminophen  (TYLENOL ) 650 MG CR tablet Take 650 mg by mouth at bedtime.   Yes [provider]  ALPRAZolam  (XANAX ) 0.25 MG tablet TAKE ONE TABLET BY MOUTH TWICE DAILY AS NEEDED Patient taking differently: Take 0.25 mg by mouth daily. 09/25/23  Yes Mast, Man X, NP  amLODipine  (NORVASC ) 2.5 MG  tablet Take 1 tablet (2.5 mg total) by mouth daily. 08/31/23 08/30/24 Yes End, Lonni, MD  aspirin  EC 81 MG tablet Take 1 tablet (81 mg total) by mouth daily. Swallow whole. 08/28/23  Yes Chandrasekhar, Mahesh A, MD  Cholecalciferol (VITAMIN D3) 125 MCG (5000 UT) TABS Take 5,000 Units by mouth daily.   Yes [provider]  clopidogrel  (PLAVIX ) 75 MG tablet Take 1 tablet (75 mg total) by mouth daily. 07/17/23  Yes Thukkani, Arun K, MD  esomeprazole  (NEXIUM ) 40 MG capsule Take 1 capsule (40 mg total) by mouth daily. 11/02/23  Yes [place], PA-C  FLUoxetine  (PROZAC ) 40 MG capsule TAKE ONE CAPSULE BY MOUTH EVERY DAY. 10/30/23  Yes Mast, Man X, NP  glipiZIDE  (GLUCOTROL ) 5 MG tablet Take 1 tablet (5 mg total) by mouth 2 (two) times daily before a meal. 10/30/23  Yes Mast, Man X, NP  Glycerin-Hypromellose-PEG 400 (DRY EYE RELIEF DROPS OP) Place 1 drop into both eyes daily as needed (Dry eye).   Yes [provider]  Multiple Vitamins-Minerals (PRESERVISION AREDS PO) Take 1 tablet by mouth in the morning and at bedtime.   Yes [provider]  Omega-3 Fatty Acids (FISH OIL) 1000 MG CAPS Take 1,000 mg by mouth daily.   Yes [provider]  sucralfate  (CARAFATE ) 1 g tablet Take 1 tablet (1 g total) by mouth 3 (three) times daily as needed. 11/02/23 04/30/24 Yes [place], PA-C  triamcinolone  cream (KENALOG ) 0.1 % Apply 1 Application topically 2 (two) times daily. Apply 2 times daily to the back for up to 2 weeks then STOP, Resume in 2 weeks if irritation is not resolved Patient taking differently: Apply 1 Application topically as needed (skin irritation). Apply 2 times daily to the back for up to 2 weeks then STOP, Resume in 2 weeks if irritation is not resolved 06/13/23  Yes Alm Delon SAILOR, DO  Accu-Chek FastClix Lancets MISC USE AS DIRECTED. 04/18/22   Mast, Man X, NP  glucose blood (ACCU-CHEK SMARTVIEW) test strip CHECK BLOOD SUGAR 3 TIMES DAILY AS DIRECTED. 04/18/22    Mast, Man X, NP    Current Facility-Administered Medications  Medication Dose Route Frequency Provider Last Rate Last Admin   0.9 %  sodium chloride  infusion   Intravenous Continuous Pokhrel, Laxman, MD       0.9 %  sodium chloride  infusion  250 mL Intravenous PRN Pokhrel, Laxman, MD       acetaminophen  (TYLENOL ) CR tablet 650 mg  650 mg Oral QHS Pokhrel, Laxman, MD  acetaminophen  (TYLENOL ) tablet 500 mg  500 mg Oral q morning Pokhrel, Laxman, MD       ALPRAZolam  (XANAX ) tablet 0.25 mg  0.25 mg Oral BID PRN Pokhrel, Laxman, MD       amLODipine  (NORVASC ) tablet 2.5 mg  2.5 mg Oral Daily Pokhrel, Laxman, MD       hydrALAZINE  (APRESOLINE ) injection 5 mg  5 mg Intravenous Q6H PRN Pokhrel, Laxman, MD       ondansetron  (ZOFRAN ) tablet 4 mg  4 mg Oral Q6H PRN Pokhrel, Laxman, MD       Or   ondansetron  (ZOFRAN ) injection 4 mg  4 mg Intravenous Q6H PRN Pokhrel, Laxman, MD       pantoprazole (PROTONIX) injection 40 mg  40 mg Intravenous Q12H Pokhrel, Laxman, MD       polyethylene glycol (MIRALAX / GLYCOLAX) packet 17 g  17 g Oral Daily PRN Pokhrel, Laxman, MD       sodium chloride  flush (NS) 0.9 % injection 3 mL  3 mL Intravenous Q12H Pokhrel, Laxman, MD       sodium chloride  flush (NS) 0.9 % injection 3 mL  3 mL Intravenous PRN Pokhrel, Laxman, MD       Vitamin D3 TABS 5,000 Units  5,000 Units Oral Daily Pokhrel, Laxman, MD       Current Outpatient Medications  Medication Sig Dispense Refill   acetaminophen  (TYLENOL ) 500 MG tablet Take 500 mg by mouth every morning.     acetaminophen  (TYLENOL ) 650 MG CR tablet Take 650 mg by mouth at bedtime.     ALPRAZolam  (XANAX ) 0.25 MG tablet TAKE ONE TABLET BY MOUTH TWICE DAILY AS NEEDED (Patient taking differently: Take 0.25 mg by mouth daily.) 60 tablet 1   amLODipine  (NORVASC ) 2.5 MG tablet Take 1 tablet (2.5 mg total) by mouth daily. 30 tablet 5   aspirin  EC 81 MG tablet Take 1 tablet (81 mg total) by mouth daily. Swallow whole.     Cholecalciferol  (VITAMIN D3) 125 MCG (5000 UT) TABS Take 5,000 Units by mouth daily.     clopidogrel  (PLAVIX ) 75 MG tablet Take 1 tablet (75 mg total) by mouth daily. 90 tablet 3   esomeprazole  (NEXIUM ) 40 MG capsule Take 1 capsule (40 mg total) by mouth daily. 90 capsule 3   FLUoxetine  (PROZAC ) 40 MG capsule TAKE ONE CAPSULE BY MOUTH EVERY DAY. 90 capsule 1   glipiZIDE  (GLUCOTROL ) 5 MG tablet Take 1 tablet (5 mg total) by mouth 2 (two) times daily before a meal. 180 tablet 1   Glycerin-Hypromellose-PEG 400 (DRY EYE RELIEF DROPS OP) Place 1 drop into both eyes daily as needed (Dry eye).     Multiple Vitamins-Minerals (PRESERVISION AREDS PO) Take 1 tablet by mouth in the morning and at bedtime.     Omega-3 Fatty Acids (FISH OIL) 1000 MG CAPS Take 1,000 mg by mouth daily.     sucralfate  (CARAFATE ) 1 g tablet Take 1 tablet (1 g total) by mouth 3 (three) times daily as needed. 90 tablet 5   triamcinolone  cream (KENALOG ) 0.1 % Apply 1 Application topically 2 (two) times daily. Apply 2 times daily to the back for up to 2 weeks then STOP, Resume in 2 weeks if irritation is not resolved (Patient taking differently: Apply 1 Application topically as needed (skin irritation). Apply 2 times daily to the back for up to 2 weeks then STOP, Resume in 2 weeks if irritation is not resolved) 453.6 g 2   Accu-Chek FastClix Lancets  MISC USE AS DIRECTED. 200 each 2   glucose blood (ACCU-CHEK SMARTVIEW) test strip CHECK BLOOD SUGAR 3 TIMES DAILY AS DIRECTED. 200 strip 2    Allergies as of 01/02/2024 - Review Complete 01/02/2024  Allergen Reaction Noted   Statins Other (See Comments) 01/02/2024   Sulfa antibiotics Nausea And Vomiting 11/01/2023   Zetia  [ezetimibe ]  05/04/2015    Social History   Socioeconomic History   Marital status: Widowed    Spouse name: Not on file   Number of children: 2   Years of education: Not on file   Highest education level: Not on file  Occupational History   Occupation: retired    Associate Professor:  RETIRED  Tobacco Use   Smoking status: Former    Passive exposure: Never   Smokeless tobacco: Never   Tobacco comments:    Late 20's or early 30's  Vaping Use   Vaping status: Never Used  Substance and Sexual Activity   Alcohol use: Yes    Alcohol/week: 0.0 standard drinks of alcohol    Comment: socially   Drug use: No   Sexual activity: Not on file  Other Topics Concern   Not on file  Social History Narrative   Tobacco use, amount per day now: None      Past tobacco use, amount per day: in her late 43s a pack per day      How many years did you use tobacco: 10      Alcohol use (drinks per week): occasional wine      Diet:      Do you drink/eat things with caffeine? Tea, soft drinks and coffee      Marital status: Married             What year were you married? 1962      Do you live in a house, apartment, assisted living, condo, trailer? Apartment      Is it one or more stories? Yes      How many persons live in your home? 0      Do you have any pets in your home? 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 Yes            How often? Water aerobics       Do you have a living will? Yes      Do you have a DNR form? Yes           If not, do you want to discuss one?      Do you have signed POA/HPOA forms?  Yes            Social Drivers of Corporate investment banker Strain: Low Risk  (02/21/2017)   Overall Financial Resource Strain (CARDIA)    Difficulty of Paying Living Expenses: Not hard at all  Food Insecurity: No Food Insecurity (02/21/2017)   Hunger Vital Sign    Worried About Running Out of Food in the Last Year: Never true    Ran Out of Food in the Last Year: Never true  Transportation Needs: No Transportation Needs (02/21/2017)   PRAPARE - Administrator, Civil Service (Medical): No    Lack of Transportation (Non-Medical): No  Physical Activity: Insufficiently Active (02/21/2017)   Exercise Vital Sign    Days of  Exercise per Week: 3 days    Minutes of Exercise per Session: 30 min  Stress: No Stress Concern Present (02/21/2017)   [employer] of Occupational Health - Occupational Stress Questionnaire    Feeling of Stress : Only a little  Social Connections: Moderately Integrated (03/05/2018)   Social Connection and Isolation Panel    Frequency of Communication with Friends and Family: More than three times a week    Frequency of Social Gatherings with Friends and Family: More than three times a week    Attends Religious Services: 1 to 4 times per year    Active Member of [employer] or Organizations: Yes    Attends Banker Meetings: 1 to 4 times per year    Marital Status: Widowed  Intimate Partner Violence: Not At Risk (02/21/2017)   Humiliation, Afraid, Rape, and Kick questionnaire    Fear of Current or Ex-Partner: No    Emotionally Abused: No    Physically Abused: No    Sexually Abused: No     Code Status   Code Status: Limited: Do not attempt resuscitation (DNR) -DNR-LIMITED -Do Not Intubate/DNI   Review of Systems: All systems reviewed and negative except where noted in HPI.  Physical Exam: Vital signs in last 24 hours: Temp:  [97.8 F (36.6 C)-98 F (36.7 C)] 98 F (36.7 C) (10/14 1042) Pulse Rate:  [87-116] 98 (10/14 1100) Resp:  [16-20] 17 (10/14 1130) BP: (128-189)/(53-87) 139/64 (10/14 1130) SpO2:  [94 %-100 %] 100 % (10/14 1100) Weight:  [90.7 kg] 90.7 kg (10/14 9361)    General:  Pleasant female in NAD Psych:  Cooperative. Normal mood and affect Eyes: Pupils equal Ears:  Normal auditory acuity Nose: No deformity, discharge or lesions Neck:  Supple, no masses felt Lungs:  Clear to auscultation.  Heart:  Regular rate, regular rhythm.  Abdomen:  Soft, nondistended, nontender, active bowel sounds, no masses felt Rectal :  Deferred Msk: Symmetrical without gross deformities.  Neurologic:  Alert, oriented, grossly normal neurologically Extremities : No  edema Skin:  Intact without significant lesions.    Intake/Output from previous day: No intake/output data recorded. Intake/Output this shift:  No intake/output data recorded.   Vina Dasen, NP-C   01/02/2024, 12:55 PM

## 2024-01-02 NOTE — ED Triage Notes (Signed)
 PT brought In [facility], PT states she had gas and passed it and it was blood. PT states she then had a bloody bowel movement bright red. PT states she has had them in the past of hemorrhoids. PT sttaes she is also on plavix . PT alert and talking. PT sttaes she was dizzy a little but no LOC.

## 2024-01-03 ENCOUNTER — Encounter (HOSPITAL_COMMUNITY): Payer: Self-pay | Admitting: Radiology

## 2024-01-03 DIAGNOSIS — D62 Acute posthemorrhagic anemia: Secondary | ICD-10-CM | POA: Diagnosis not present

## 2024-01-03 DIAGNOSIS — K5791 Diverticulosis of intestine, part unspecified, without perforation or abscess with bleeding: Secondary | ICD-10-CM | POA: Diagnosis not present

## 2024-01-03 LAB — IRON AND TIBC
Iron: 76 ug/dL (ref 28–170)
Saturation Ratios: 23 % (ref 10.4–31.8)
TIBC: 332 ug/dL (ref 250–450)
UIBC: 256 ug/dL

## 2024-01-03 LAB — CBC
HCT: 27.7 % — ABNORMAL LOW (ref 36.0–46.0)
HCT: 25.8 % — ABNORMAL LOW (ref 36.0–46.0)
Hemoglobin: 8.9 g/dL — ABNORMAL LOW (ref 12.0–15.0)
Hemoglobin: 8.3 g/dL — ABNORMAL LOW (ref 12.0–15.0)
MCH: 29.2 pg (ref 26.0–34.0)
MCH: 29.2 pg (ref 26.0–34.0)
MCHC: 32.1 g/dL (ref 30.0–36.0)
MCHC: 32.2 g/dL (ref 30.0–36.0)
MCV: 90.8 fL (ref 80.0–100.0)
MCV: 90.8 fL (ref 80.0–100.0)
Platelets: 235 K/uL (ref 150–400)
Platelets: 251 K/uL (ref 150–400)
RBC: 3.05 MIL/uL — ABNORMAL LOW (ref 3.87–5.11)
RBC: 2.84 MIL/uL — ABNORMAL LOW (ref 3.87–5.11)
RDW: 14.6 % (ref 11.5–15.5)
RDW: 14.6 % (ref 11.5–15.5)
WBC: 10.3 K/uL (ref 4.0–10.5)
WBC: 11.5 K/uL — ABNORMAL HIGH (ref 4.0–10.5)
nRBC: 0 % (ref 0.0–0.2)
nRBC: 0 % (ref 0.0–0.2)

## 2024-01-03 LAB — RETICULOCYTES
Immature Retic Fract: 16.1 % — ABNORMAL HIGH (ref 2.3–15.9)
RBC.: 2.96 MIL/uL — ABNORMAL LOW (ref 3.87–5.11)
Retic Count, Absolute: 57.1 K/uL (ref 19.0–186.0)
Retic Ct Pct: 1.9 % (ref 0.4–3.1)

## 2024-01-03 LAB — HEMOGLOBIN AND HEMATOCRIT, BLOOD
HCT: 24 % — ABNORMAL LOW (ref 36.0–46.0)
Hemoglobin: 7.7 g/dL — ABNORMAL LOW (ref 12.0–15.0)

## 2024-01-03 LAB — BASIC METABOLIC PANEL WITH GFR
Anion gap: 10 (ref 5–15)
BUN: 33 mg/dL — ABNORMAL HIGH (ref 8–23)
CO2: 20 mmol/L — ABNORMAL LOW (ref 22–32)
Calcium: 8.4 mg/dL — ABNORMAL LOW (ref 8.9–10.3)
Chloride: 108 mmol/L (ref 98–111)
Creatinine, Ser: 1.31 mg/dL — ABNORMAL HIGH (ref 0.44–1.00)
GFR, Estimated: 40 mL/min — ABNORMAL LOW (ref >60)
Glucose, Bld: 113 mg/dL — ABNORMAL HIGH (ref 70–99)
Potassium: 3.7 mmol/L (ref 3.5–5.1)
Sodium: 138 mmol/L (ref 135–145)

## 2024-01-03 LAB — GLUCOSE, CAPILLARY
Glucose-Capillary: 119 mg/dL — ABNORMAL HIGH (ref 70–99)
Glucose-Capillary: 124 mg/dL — ABNORMAL HIGH (ref 70–99)
Glucose-Capillary: 93 mg/dL (ref 70–99)
Glucose-Capillary: 152 mg/dL — ABNORMAL HIGH (ref 70–99)

## 2024-01-03 LAB — FOLATE: Folate: 17.4 ng/mL (ref >5.9)

## 2024-01-03 LAB — VITAMIN B12: Vitamin B-12: 507 pg/mL (ref 180–914)

## 2024-01-03 LAB — FERRITIN: Ferritin: 23 ng/mL (ref 11–307)

## 2024-01-03 MED ORDER — NA SULFATE-K SULFATE-MG SULF 17.5-3.13-1.6 GM/177ML PO SOLN
0.5000 | Freq: Once | ORAL | Status: AC
  Administered 2024-01-03: 177 mL via ORAL
  Filled 2024-01-03: qty 1

## 2024-01-03 MED ORDER — SODIUM CHLORIDE 0.9 % IV SOLN: INTRAVENOUS | Status: AC

## 2024-01-03 MED ORDER — SIMETHICONE 80 MG PO CHEW
240.0000 mg | CHEWABLE_TABLET | Freq: Once | ORAL | Status: AC
  Administered 2024-01-03: 240 mg via ORAL
  Filled 2024-01-03: qty 3

## 2024-01-03 NOTE — Plan of Care (Signed)
  Problem: Education: Goal: Ability to describe self-care measures that may prevent or decrease complications (Diabetes Survival Skills Education) will improve Outcome: Progressing Goal: Individualized Educational Video(s) Outcome: Progressing   Problem: Coping: Goal: Ability to adjust to condition or change in health will improve Outcome: Progressing   Problem: Fluid Volume: Goal: Ability to maintain a balanced intake and output will improve Outcome: Progressing   Problem: Health Behavior/Discharge Planning: Goal: Ability to identify and utilize available resources and services will improve Outcome: Progressing Goal: Ability to manage health-related needs will improve Outcome: Progressing   Problem: Metabolic: Goal: Ability to maintain appropriate glucose levels will improve Outcome: Progressing   Problem: Nutritional: Goal: Maintenance of adequate nutrition will improve Outcome: Progressing Goal: Progress toward achieving an optimal weight will improve Outcome: Progressing   Problem: Skin Integrity: Goal: Risk for impaired skin integrity will decrease Outcome: Progressing   Problem: Tissue Perfusion: Goal: Adequacy of tissue perfusion will improve Outcome: Progressing   Problem: Education: Goal: Knowledge of General Education information will improve Description: Including pain rating scale, medication(s)/side effects and non-pharmacologic comfort measures Outcome: Progressing   Problem: Health Behavior/Discharge Planning: Goal: Ability to manage health-related needs will improve Outcome: Progressing   Problem: Clinical Measurements: Goal: Ability to maintain clinical measurements within normal limits will improve Outcome: Progressing Goal: Will remain free from infection Outcome: Progressing Goal: Diagnostic test results will improve Outcome: Progressing Goal: Respiratory complications will improve Outcome: Progressing Goal: Cardiovascular complication will  be avoided Outcome: Progressing   Problem: Activity: Goal: Risk for activity intolerance will decrease Outcome: Progressing   Problem: Nutrition: Goal: Adequate nutrition will be maintained Outcome: Progressing   Problem: Coping: Goal: Level of anxiety will decrease Outcome: Progressing   Problem: Elimination: Goal: Will not experience complications related to bowel motility Outcome: Progressing Goal: Will not experience complications related to urinary retention Outcome: Progressing   Problem: Pain Managment: Goal: General experience of comfort will improve and/or be controlled Outcome: Progressing   Problem: Safety: Goal: Ability to remain free from injury will improve Outcome: Progressing   Problem: Skin Integrity: Goal: Risk for impaired skin integrity will decrease Outcome: Progressing   Problem: Education: Goal: Understanding of CV disease, CV risk reduction, and recovery process will improve Outcome: Progressing Goal: Individualized Educational Video(s) Outcome: Progressing   Problem: Activity: Goal: Ability to return to baseline activity level will improve Outcome: Progressing   Problem: Cardiovascular: Goal: Ability to achieve and maintain adequate cardiovascular perfusion will improve Outcome: Progressing Goal: Vascular access site(s) Level 0-1 will be maintained Outcome: Progressing

## 2024-01-03 NOTE — TOC Initial Note (Signed)
 Transition of Care [facility]) - Initial/Assessment Note    Patient Details  Name: Brittany Mcclure MRN: [id number] Date of Birth: 18-Oct-1940  [facility] CM/SW Contact:    Lauraine FORBES Saa, LCSWA Phone Number: 01/03/2024, 9:22 AM  Clinical Narrative:                  9:22 AM Per chart review, patient is from [facility] ILF. [facility] confirmed patient is from their ILF [place] location. Patient does not have SNF/HH/DME history. Patient's preferred pharmacy's are [facility] and [facility] [address]. No TOC needs identified at this time. TOC will continue to follow.  Expected Discharge Plan: Home/Self Care Barriers to Discharge: Continued Medical Work up   Patient Goals and CMS Choice            Expected Discharge Plan and Services   Discharge Planning Services: CM Consult   Living arrangements for the past 2 months: Independent Living Facility                                      Prior Living Arrangements/Services Living arrangements for the past 2 months: Independent Living Facility Lives with:: Facility Resident Patient language and need for interpreter reviewed:: Yes        Need for Family Participation in Patient Care: No (Comment)     Criminal Activity/Legal Involvement Pertinent to Current Situation/Hospitalization: No - Comment as needed  Activities of Daily Living   ADL Screening (condition at time of admission) Independently performs ADLs?: Yes (appropriate for developmental age) Is the patient deaf or have difficulty hearing?: Yes Does the patient have difficulty seeing, even when wearing glasses/contacts?: No Does the patient have difficulty concentrating, remembering, or making decisions?: No  Permission Sought/Granted Permission sought to share information with : Family Supports, Oceanographer granted to share information with : No (Contact information on chart)  Share Information  with NAME: Reena Senters  Permission granted to share info w AGENCY: [facility]  Permission granted to share info w Relationship: Daughter  Permission granted to share info w Contact Information: [phone]  Emotional Assessment       Orientation: : Oriented to Self, Oriented to Place, Oriented to  Time, Oriented to Situation Alcohol / Substance Use: Not Applicable Psych Involvement: No (comment)  Admission diagnosis:  Acute lower GI bleeding [K92.2] GI bleed [K92.2] Patient Active Problem List   Diagnosis Date Noted   GI bleed 01/02/2024   Hydrosalpinx 11/06/2023   Abnormal ultrasound of endometrium 11/06/2023   Endometrial polyp 11/06/2023   Adnexal mass 07/06/2023   Osteopenia after menopause 11/04/2022   Malaise and fatigue 04/07/2022   History of traumatic injury of head 05/27/2021   Anemia due to chronic kidney disease 08/27/2020   Macular degeneration of left eye 11/07/2019   DDD (degenerative disc disease), cervical 08/07/2019   Chronic GERD 06/14/2018   UTI (urinary tract infection) 12/07/2017   Dysuria 11/30/2017   History of GI bleed 03/07/2017   Bilateral carpal tunnel syndrome 03/07/2017   Closed torus fracture of distal end of right radius with routine healing 02/24/2017   CKD (chronic kidney disease) stage 3, GFR 30-59 ml/min (HCC) 12/06/2016   Chronic pain of right hand 12/06/2016   Essential hypertension 12/17/2014   Type 2 diabetes mellitus with obesity 12/17/2014   Gout 10/14/2013   Left-sided low back pain with sciatica 02/15/2013  Benign hypertensive heart disease without heart failure 07/01/2010   Dyslipidemia 07/01/2010   Anxiety 07/01/2010   Metabolic syndrome 07/01/2010   PCP:  Mast, Man X, NP Pharmacy:   [facility] [place], [place] - [address] [facility] [address] [facility] [address] [place] [address] Phone: [phone] Fax: [phone]  [id number] [place], [place] - [address] AT [facility] [address] [place] [address] Phone: [phone] Fax: [phone]     Social Drivers of Health (SDOH) Social History: SDOH Screenings   Food Insecurity: No Food Insecurity (01/02/2024)  Housing: Low Risk  (01/02/2024)  Transportation Needs: No Transportation Needs (01/02/2024)  Utilities: Not At Risk (01/02/2024)  Depression (PHQ2-9): Low Risk  (04/06/2023)  Financial Resource Strain: Low Risk  (02/21/2017)  Physical Activity: Insufficiently Active (02/21/2017)  Social Connections: Moderately Isolated (01/02/2024)  Stress: No Stress Concern Present (02/21/2017)  Tobacco Use: Medium Risk (01/02/2024)   SDOH Interventions:     Readmission Risk Interventions     No data to display

## 2024-01-03 NOTE — Progress Notes (Signed)
 PT Cancellation Note  Patient Details Name: Brittany Mcclure MRN: [id number] DOB: 04-06-1940   Cancelled Treatment:    Reason Eval/Treat Not Completed: Medical issues which prohibited therapy (Pt with active GIB. BM with large amounts of blood per RN. Will hold at this time. Will follow up as able and appropriate.)  [name], DPT, CLT  Acute Rehabilitation Services Office: [phone] (Secure chat preferred)   Dorothyann VEAR Maier 01/03/2024, 2:08 PM

## 2024-01-03 NOTE — H&P (View-Only) (Signed)
 Daily Progress Note  DOA: 01/02/2024 Hospital Day: 2  Cc:  Diverticular bleed   Attending physician's note   I personally saw the patient and performed a substantive portion of the medical decision making process for this encounter (including a complete performance of the key components : MDM, Hx and Exam), in conjunction with the APP.  I agree with the APP's note, impression, and  the management plan for the number and complexity of problems addressed at the encounter for the patient and take responsibility for that plan with its inherent risk of complications, morbidity, or mortality with additional input as follows.      83 yr old with acute diverticular hemorrhage with ongoing bleeding.  CTA was positive for active bleed from sigmoid diverticula but subsequent IMA angiogram was negative for active extravasation, did not undergo embolization by IR  clear liquid diet Bowel prep Plan for colonoscopy to intervene endoscopically given persistent ongoing bleeding with significant decline in hemoglobin and hemodynamic instability N.p.o. after midnight  Called   LOIS Wilkie Mcgee , MD [phone]     ASSESSMENT    83 yo female with hemodynamically unstable sigmoid diverticular bleeding on plavix .  CT angio demonstrating active sigmoid diverticular bleed.  There was no active bleeding seen on IMA angiogram yesterday.   TODAY: Since yesterday afternoon patient had not had any further bleeding until just a few minutes ago when she passed a large amount of dark red blood.  She subsequently became tachycardic.  Could be old blood but suspect with associated tachycardia that she is actively bleeding again.  Currently her vital signs are stable   Acute blood loss anemia.  Presenting hemoglobin 11.6 ( baseline 12.4).   TODAY:  Hemoglobin 8.9 ( 5am)  CKD 3a   History of CAD Takes Plavix  (on hold) and aspirin .  Last dose of Plavix  was 01/01/2024   History of colon polyps    Type 2 diabetes   Hypertension   Chronic / stable GI history :  Chronic GERD -takes daily Nexium  at home Hiatal hernia History of erosive gastritis , PUD ( non-H.pylori related)  Hepatic steatosis- normal LFTs   PLAN   --CBC now --Will prep bowels in anticipation of colonoscopy tomorrow given the ongoing bleeding.  If bleeding stops then we may not have to proceed but will make that call tomorrow morning.  The risks and benefits of colonoscopy with possible polypectomy / biopsies were discussed and the patient agrees to proceed. Tomorrow will be 3rd day off plavix    Subjective   Feeling okay.  No bleeding since yesterday afternoon until just a few minutes ago when she passed a large amount of dark red blood.  Per nurse she had associated tachycardia.  Hospitalists notified by RN, CBC was ordered.  Results are pending  Objective    Recent Labs    01/02/24 0726 01/02/24 1104 01/02/24 1857 01/03/24 0506  WBC 10.4 14.1*  --  10.3  HGB 11.6* 10.5* 10.0* 8.9*  HCT 35.9* 32.8* 30.6* 27.7*  MCV 90.0 90.9  --  90.8  PLT 288 255  --  235   Recent Labs    01/03/24 0958  FOLATE 17.4  VITAMINB12 507  FERRITIN 23  TIBC 332  IRONPCTSAT 23   Recent Labs    01/02/24 0726 01/03/24 0506  NA 139 138  K 4.5 3.7  CL 105 108  CO2 23 20*  GLUCOSE 142* 113*  BUN 26* 33*  CREATININE 1.14* 1.31*  CALCIUM  9.0 8.4*     Imaging:  IR Angiogram Visceral Selective INDICATION: Acute GI bleed in a patient with bloody stool. CTA of the abdomen demonstrates active bleeding likely secondary to diverticulosis in the sigmoid colon.  EXAM: Pelvic angiogram  ANESTHESIA/SEDATION: Moderate (conscious) sedation was employed during this procedure. A total of Versed  1.5 mg and Fentanyl  75 mcg was administered intravenously by the radiology nurse.  Total intra-service moderate Sedation Time: 30 minutes. The patient's level of consciousness and vital signs were monitored continuously by  radiology nursing throughout the procedure under my direct supervision.  CONTRAST:  53 mL Omnipaque  300  FLUOROSCOPY: Radiation Exposure Index (as provided by the fluoroscopic device): 3.8 minutes mGy Kerma  COMPLICATIONS: None immediate.  PROCEDURE: Informed consent was obtained from the patient following explanation of the procedure, risks, benefits and alternatives. The patient understands, agrees and consents for the procedure. All questions were addressed. A time out was performed prior to the initiation of the procedure. Maximal barrier sterile technique utilized including caps, mask, sterile gowns, sterile gloves, large sterile drape, hand hygiene, and Betadine  prep.  Ultrasound demonstrated the right common femoral artery to be anechoic and pulsatile indicating patency. The arteriotomy site was anesthetized with 1% lidocaine . Small incision was made at the arteriotomy site and blunt dissection was then applied. The needle was advanced from the incision to the midpoint of the artery under ultrasound guidance. Final image was obtained and stored in patient's permanent medical record. Access was exchanged over an 018 guidewire for micropuncture sheath. Access was exchanged over an 035 wire for a 6 [place] sheath. Guidewire and catheter were then advanced to the abdominal aorta.  The Sim 1 catheter tip was then reformed and used to selectively cannulate the IMA based on anatomic features. Catheter was then used to perform an angiogram of the IMA. No active extravasation identified. Coaxial technique was then used to select the left colic artery and 2 separate sigmoidal arteries. In serial fashion the left colic artery was injected in order to perform a dedicated angiogram closer to the likely location of bleeding and no active extravasation identified.  The microcatheter and guidewire were then used to selectively cannulate the first of 2 sigmoidal arteries. An angiogram  of the first sigmoidal artery demonstrated no active bleeding. The catheter and guidewire were again used to selectively cannulate the second sigmoidal artery and a second angiogram of the sigmoidal arteries was performed also demonstrating no active extravasation.  In order to demonstrate no further cross-filling, the superior rectal artery was then cannulated with the microcatheter and a separate superior rectal artery angiogram was performed also demonstrating no active bleeding. All catheters and guidewires removed from the patient.  Access was closed using Angio-Seal closure device.  IMPRESSION: The IMA and its branches were freely investigated with angiograms and no active extravasation identified. No further plans for intervention.  Electronically Signed   By: Cordella Banner   On: 01/02/2024 16:31     Scheduled inpatient medications:   acetaminophen   500 mg Oral q morning   acetaminophen   650 mg Oral QHS   amLODipine   2.5 mg Oral Daily   cholecalciferol  5,000 Units Oral Daily   insulin  aspart  0-9 Units Subcutaneous TID WC   pantoprazole (PROTONIX) IV  40 mg Intravenous Q12H   sodium chloride  flush  3 mL Intravenous Q12H   Continuous inpatient infusions:   sodium chloride  75 mL/hr at 01/03/24 0644   sodium chloride      PRN inpatient medications: sodium  chloride, ALPRAZolam , hydrALAZINE , ondansetron  **OR** ondansetron  (ZOFRAN ) IV, polyethylene glycol, sodium chloride  flush  Vital signs in last 24 hours: Temp:  [97.7 F (36.5 C)-98.2 F (36.8 C)] 97.7 F (36.5 C) (10/15 1211) Pulse Rate:  [86-103] 98 (10/15 1211) Resp:  [14-23] 20 (10/15 1211) BP: (113-158)/(50-92) 158/71 (10/15 1211) SpO2:  [95 %-100 %] 97 % (10/14 1520) Weight:  [91.8 kg] 91.8 kg (10/15 0618) Last BM Date : 01/02/24  Intake/Output Summary (Last 24 hours) at 01/03/2024 1220 Last data filed at 01/03/2024 0300 Gross per 24 hour  Intake 268.99 ml  Output --  Net 268.99 ml     Intake/Output from previous day: 10/14 0701 - 10/15 0700 In: 269 [P.O.:240; I.V.:29] Out: -  Intake/Output this shift: No intake/output data recorded.   Physical Exam:  General: Alert female in NAD Heart:  Regular rate and rhythm.  Pulmonary: Normal respiratory effort Abdomen: Soft, nondistended, nontender. Normal bowel sounds. Extremities: No lower extremity edema  Neurologic: Alert and oriented Psych: Pleasant. Cooperative     LOS: 1 day   Vina Dasen ,NP 01/03/2024, 12:20 PM

## 2024-01-03 NOTE — Progress Notes (Addendum)
 Daily Progress Note  DOA: 01/02/2024 Hospital Day: 2  Cc:  Diverticular bleed   Attending physician's note   I personally saw the patient and performed a substantive portion of the medical decision making process for this encounter (including a complete performance of the key components : MDM, Hx and Exam), in conjunction with the APP.  I agree with the APP's note, impression, and  the management plan for the number and complexity of problems addressed at the encounter for the patient and take responsibility for that plan with its inherent risk of complications, morbidity, or mortality with additional input as follows.      83 yr old with acute diverticular hemorrhage with ongoing bleeding.  CTA was positive for active bleed from sigmoid diverticula but subsequent IMA angiogram was negative for active extravasation, did not undergo embolization by IR  clear liquid diet Bowel prep Plan for colonoscopy to intervene endoscopically given persistent ongoing bleeding with significant decline in hemoglobin and hemodynamic instability N.p.o. after midnight  Called   LOIS Wilkie Mcgee , MD [phone]     ASSESSMENT    83 yo female with hemodynamically unstable sigmoid diverticular bleeding on plavix .  CT angio demonstrating active sigmoid diverticular bleed.  There was no active bleeding seen on IMA angiogram yesterday.   TODAY: Since yesterday afternoon patient had not had any further bleeding until just a few minutes ago when she passed a large amount of dark red blood.  She subsequently became tachycardic.  Could be old blood but suspect with associated tachycardia that she is actively bleeding again.  Currently her vital signs are stable   Acute blood loss anemia.  Presenting hemoglobin 11.6 ( baseline 12.4).   TODAY:  Hemoglobin 8.9 ( 5am)  CKD 3a   History of CAD Takes Plavix  (on hold) and aspirin .  Last dose of Plavix  was 01/01/2024   History of colon polyps    Type 2 diabetes   Hypertension   Chronic / stable GI history :  Chronic GERD -takes daily Nexium  at home Hiatal hernia History of erosive gastritis , PUD ( non-H.pylori related)  Hepatic steatosis- normal LFTs   PLAN   --CBC now --Will prep bowels in anticipation of colonoscopy tomorrow given the ongoing bleeding.  If bleeding stops then we may not have to proceed but will make that call tomorrow morning.  The risks and benefits of colonoscopy with possible polypectomy / biopsies were discussed and the patient agrees to proceed. Tomorrow will be 3rd day off plavix    Subjective   Feeling okay.  No bleeding since yesterday afternoon until just a few minutes ago when she passed a large amount of dark red blood.  Per nurse she had associated tachycardia.  Hospitalists notified by RN, CBC was ordered.  Results are pending  Objective    Recent Labs    01/02/24 0726 01/02/24 1104 01/02/24 1857 01/03/24 0506  WBC 10.4 14.1*  --  10.3  HGB 11.6* 10.5* 10.0* 8.9*  HCT 35.9* 32.8* 30.6* 27.7*  MCV 90.0 90.9  --  90.8  PLT 288 255  --  235   Recent Labs    01/03/24 0958  FOLATE 17.4  VITAMINB12 507  FERRITIN 23  TIBC 332  IRONPCTSAT 23   Recent Labs    01/02/24 0726 01/03/24 0506  NA 139 138  K 4.5 3.7  CL 105 108  CO2 23 20*  GLUCOSE 142* 113*  BUN 26* 33*  CREATININE 1.14* 1.31*  CALCIUM  9.0 8.4*     Imaging:  IR Angiogram Visceral Selective INDICATION: Acute GI bleed in a patient with bloody stool. CTA of the abdomen demonstrates active bleeding likely secondary to diverticulosis in the sigmoid colon.  EXAM: Pelvic angiogram  ANESTHESIA/SEDATION: Moderate (conscious) sedation was employed during this procedure. A total of Versed  1.5 mg and Fentanyl  75 mcg was administered intravenously by the radiology nurse.  Total intra-service moderate Sedation Time: 30 minutes. The patient's level of consciousness and vital signs were monitored continuously by  radiology nursing throughout the procedure under my direct supervision.  CONTRAST:  53 mL Omnipaque  300  FLUOROSCOPY: Radiation Exposure Index (as provided by the fluoroscopic device): 3.8 minutes mGy Kerma  COMPLICATIONS: None immediate.  PROCEDURE: Informed consent was obtained from the patient following explanation of the procedure, risks, benefits and alternatives. The patient understands, agrees and consents for the procedure. All questions were addressed. A time out was performed prior to the initiation of the procedure. Maximal barrier sterile technique utilized including caps, mask, sterile gowns, sterile gloves, large sterile drape, hand hygiene, and Betadine  prep.  Ultrasound demonstrated the right common femoral artery to be anechoic and pulsatile indicating patency. The arteriotomy site was anesthetized with 1% lidocaine . Small incision was made at the arteriotomy site and blunt dissection was then applied. The needle was advanced from the incision to the midpoint of the artery under ultrasound guidance. Final image was obtained and stored in patient's permanent medical record. Access was exchanged over an 018 guidewire for micropuncture sheath. Access was exchanged over an 035 wire for a 6 [place] sheath. Guidewire and catheter were then advanced to the abdominal aorta.  The Sim 1 catheter tip was then reformed and used to selectively cannulate the IMA based on anatomic features. Catheter was then used to perform an angiogram of the IMA. No active extravasation identified. Coaxial technique was then used to select the left colic artery and 2 separate sigmoidal arteries. In serial fashion the left colic artery was injected in order to perform a dedicated angiogram closer to the likely location of bleeding and no active extravasation identified.  The microcatheter and guidewire were then used to selectively cannulate the first of 2 sigmoidal arteries. An angiogram  of the first sigmoidal artery demonstrated no active bleeding. The catheter and guidewire were again used to selectively cannulate the second sigmoidal artery and a second angiogram of the sigmoidal arteries was performed also demonstrating no active extravasation.  In order to demonstrate no further cross-filling, the superior rectal artery was then cannulated with the microcatheter and a separate superior rectal artery angiogram was performed also demonstrating no active bleeding. All catheters and guidewires removed from the patient.  Access was closed using Angio-Seal closure device.  IMPRESSION: The IMA and its branches were freely investigated with angiograms and no active extravasation identified. No further plans for intervention.  Electronically Signed   By: Cordella Banner   On: 01/02/2024 16:31     Scheduled inpatient medications:   acetaminophen   500 mg Oral q morning   acetaminophen   650 mg Oral QHS   amLODipine   2.5 mg Oral Daily   cholecalciferol  5,000 Units Oral Daily   insulin  aspart  0-9 Units Subcutaneous TID WC   pantoprazole (PROTONIX) IV  40 mg Intravenous Q12H   sodium chloride  flush  3 mL Intravenous Q12H   Continuous inpatient infusions:   sodium chloride  75 mL/hr at 01/03/24 0644   sodium chloride      PRN inpatient medications: sodium  chloride, ALPRAZolam , hydrALAZINE , ondansetron  **OR** ondansetron  (ZOFRAN ) IV, polyethylene glycol, sodium chloride  flush  Vital signs in last 24 hours: Temp:  [97.7 F (36.5 C)-98.2 F (36.8 C)] 97.7 F (36.5 C) (10/15 1211) Pulse Rate:  [86-103] 98 (10/15 1211) Resp:  [14-23] 20 (10/15 1211) BP: (113-158)/(50-92) 158/71 (10/15 1211) SpO2:  [95 %-100 %] 97 % (10/14 1520) Weight:  [91.8 kg] 91.8 kg (10/15 0618) Last BM Date : 01/02/24  Intake/Output Summary (Last 24 hours) at 01/03/2024 1220 Last data filed at 01/03/2024 0300 Gross per 24 hour  Intake 268.99 ml  Output --  Net 268.99 ml     Intake/Output from previous day: 10/14 0701 - 10/15 0700 In: 269 [P.O.:240; I.V.:29] Out: -  Intake/Output this shift: No intake/output data recorded.   Physical Exam:  General: Alert female in NAD Heart:  Regular rate and rhythm.  Pulmonary: Normal respiratory effort Abdomen: Soft, nondistended, nontender. Normal bowel sounds. Extremities: No lower extremity edema  Neurologic: Alert and oriented Psych: Pleasant. Cooperative     LOS: 1 day   Vina Dasen ,NP 01/03/2024, 12:20 PM

## 2024-01-03 NOTE — Progress Notes (Signed)
 PROGRESS NOTE  Brittany Mcclure FMW:[id number] DOB: Jul 27, 1940   PCP: Mast, Man X, NP  Patient is from: Home.  DOA: 01/02/2024 LOS: 1  Chief complaints Chief Complaint  Patient presents with   Rectal Bleeding     Brief Narrative / Interim history: 83 year old F with PMH of CAD on DAPT, CKD-3A, gastric ulcer, hemorrhoids, HTN, HLD, GERD and obesity presenting with acute BRBPR since 5 PM on the day of presentation, and admitted with lower GI bleed.  In ED, stable vitals.  Hgb 11.6 (baseline 12-13).  WBC 14.1.  INR 1.0.  Hemoccult positive.  CT angio  positive for acute diverticular bleeding from the distal sigmoid colon.  GI consulted and recommended IR embolization.  Patient was taken for IR embolization on 10/14 but no extravasation on IMA angiogram. Hemoglobin dropped further to 8.9.  Plan for colonoscopy on 10/16.  Subjective: Seen and examined earlier this morning.  No major events overnight or this morning.  RN noted small blood when she cleaned patient earlier this morning.  She had large amount of dark red blood later in the morning.  She became tachycardic briefly.  Hemodynamically stable but reports dizziness when she gets up.  Denies chest pain or shortness of breath.  Denies abdominal pain.  Objective: Vitals:   01/03/24 0618 01/03/24 0816 01/03/24 1041 01/03/24 1211  BP:  113/63 (!) 148/87 (!) 158/71  Pulse:  97  98  Resp: 18 17  20   Temp:  98.2 F (36.8 C)  97.7 F (36.5 C)  TempSrc:  Oral  Oral  SpO2:      Weight: 91.8 kg     Height:        Examination:  GENERAL: No apparent distress.  Nontoxic. HEENT: MMM.  Vision and hearing grossly intact.  NECK: Supple.  No apparent JVD.  RESP:  No IWOB.  Fair aeration bilaterally. CVS:  RRR. Heart sounds normal.  ABD/GI/GU: BS+. Abd soft, NTND.  MSK/EXT:  Moves extremities. No apparent deformity. No edema.  SKIN: no apparent skin lesion or wound NEURO: AA.  Oriented appropriately.  No apparent focal neuro  deficit. PSYCH: Calm. Normal affect.   Consultants:  Gastroenterology Interventional radiology  Procedures: 10/14-IMA angiogram without extravasation.  Microbiology summarized: MRSA PCR screen nonreactive.  Assessment and plan: ABLA due to acute diverticular bleeding: Patient is on Plavix  and aspirin  for CAD.  Hgb slowly drifting down.  CT angio with diverticular bleed from distal sigmoid colon.  IMA angiogram negative. Recent Labs    03/29/23 1105 08/28/23 1622 11/06/23 0948 01/02/24 0726 01/02/24 1104 01/02/24 1857 01/03/24 0506  HGB 12.8 12.6 12.4 11.6* 10.5* 10.0* 8.9*  - Check CBC given ongoing bleeding - Anemia panel, clear liquid diet - Monitor H&H.  Transfuse for Hgb <8.0. - Continue holding Plavix  and aspirin . - Plan for colonoscopy on 10/16 if she continues to bleed.  History of CAD: On Plavix  and aspirin .  No anginal symptoms.  LHC on 6/12 with noncritical single-vessel CAD with 70 to 80% stenosis of mid RCA.  At that time, the recommendation was aspirin  81 mg daily for moderate CAD.  Now with GI bleed -Continue holding Plavix  and aspirin  -We may have to clarify with primary cardiologist if she needs to be on DAPT moving forward  NIDDM-2 with hyperglycemia: A1c 6.6% on 7/10. Recent Labs  Lab 01/02/24 1640 01/02/24 2105 01/03/24 0555 01/03/24 1210  GLUCAP 152* 121* 119* 124*  - Continue SSI-sensitive  CKD-3A: Relatively stable -Continue monitoring  GERD/history of gastric  ulcer -Continue PPI   Essential hypertension: BP slightly elevated. -Continue amlodipine   Mood disorder: Stable - Continue home meds    Mild leukocytosis: Likely demargination.  Resolved.   Class II obesity Body mass index is 38.24 kg/m.          DVT prophylaxis:  SCDs Start: 01/02/24 1237  Code Status: DNR Family Communication: None at bedside Level of care: Progressive Status is: Inpatient Remains inpatient appropriate because: ABLA/rectal bleed   Final  disposition: Home   55 minutes with more than 50% spent in reviewing records, counseling patient/family and coordinating care.   Sch Meds:  Scheduled Meds:  acetaminophen   500 mg Oral q morning   acetaminophen   650 mg Oral QHS   amLODipine   2.5 mg Oral Daily   cholecalciferol  5,000 Units Oral Daily   insulin  aspart  0-9 Units Subcutaneous TID WC   Na Sulfate-K Sulfate-Mg Sulfate concentrate  0.5 kit Oral Once   Followed by   Na Sulfate-K Sulfate-Mg Sulfate concentrate  0.5 kit Oral Once   pantoprazole (PROTONIX) IV  40 mg Intravenous Q12H   simethicone  240 mg Oral Once   Followed by   simethicone  240 mg Oral Once   sodium chloride  flush  3 mL Intravenous Q12H   Continuous Infusions:  sodium chloride  75 mL/hr at 01/03/24 0644   sodium chloride      PRN Meds:.ALPRAZolam , hydrALAZINE , ondansetron  **OR** ondansetron  (ZOFRAN ) IV, polyethylene glycol, sodium chloride  flush  Antimicrobials: Anti-infectives (From admission, onward)    None        I have personally reviewed the following labs and images: CBC: Recent Labs  Lab 01/02/24 0726 01/02/24 1104 01/02/24 1857 01/03/24 0506  WBC 10.4 14.1*  --  10.3  NEUTROABS 7.9*  --   --   --   HGB 11.6* 10.5* 10.0* 8.9*  HCT 35.9* 32.8* 30.6* 27.7*  MCV 90.0 90.9  --  90.8  PLT 288 255  --  235   BMP &GFR Recent Labs  Lab 01/02/24 0726 01/03/24 0506  NA 139 138  K 4.5 3.7  CL 105 108  CO2 23 20*  GLUCOSE 142* 113*  BUN 26* 33*  CREATININE 1.14* 1.31*  CALCIUM 9.0 8.4*   Estimated Creatinine Clearance: 32.4 mL/min (A) (by C-G formula based on SCr of 1.31 mg/dL (H)). Liver & Pancreas: Recent Labs  Lab 01/02/24 0726  AST 20  ALT 15  ALKPHOS 108  BILITOT 0.7  PROT 6.3*  ALBUMIN 3.2*   No results for input(s): LIPASE, AMYLASE in the last 168 hours. No results for input(s): AMMONIA in the last 168 hours. Diabetic: No results for input(s): HGBA1C in the last 72 hours. Recent Labs  Lab  01/02/24 1640 01/02/24 2105 01/03/24 0555 01/03/24 1210  GLUCAP 152* 121* 119* 124*   Cardiac Enzymes: No results for input(s): CKTOTAL, CKMB, CKMBINDEX, TROPONINI in the last 168 hours. No results for input(s): PROBNP in the last 8760 hours. Coagulation Profile: Recent Labs  Lab 01/02/24 0726  INR 1.0   Thyroid  Function Tests: No results for input(s): TSH, T4TOTAL, FREET4, T3FREE, THYROIDAB in the last 72 hours. Lipid Profile: No results for input(s): CHOL, HDL, LDLCALC, TRIG, CHOLHDL, LDLDIRECT in the last 72 hours. Anemia Panel: Recent Labs    01/03/24 0958  VITAMINB12 507  FOLATE 17.4  FERRITIN 23  TIBC 332  IRON 76  RETICCTPCT 1.9   Urine analysis:    Component Value Date/Time   BILIRUBINUR n 02/22/2016 1353   PROTEINUR  n 02/22/2016 1353   UROBILINOGEN 0.2 02/22/2016 1353   NITRITE n 02/22/2016 1353   LEUKOCYTESUR Trace (A) 02/22/2016 1353   Sepsis Labs: Invalid input(s): PROCALCITONIN, LACTICIDVEN  Microbiology: Recent Results (from the past 240 hours)  MRSA Next Gen by PCR, Nasal     Status: None   Collection Time: 01/02/24  4:46 PM   Specimen: Nasal Mucosa; Nasal Swab  Result Value Ref Range Status   MRSA by PCR Next Gen NOT DETECTED NOT DETECTED Final    Comment: (NOTE) The GeneXpert MRSA Assay (FDA approved for NASAL specimens only), is one component of a comprehensive MRSA colonization surveillance program. It is not intended to diagnose MRSA infection nor to guide or monitor treatment for MRSA infections. Test performance is not FDA approved in patients less than 104 years old. Performed at [facility], [address] [place], [place] [address]     Radiology Studies: IR Angiogram Selective Each Additional Vessel Result Date: 01/02/2024 INDICATION: Acute GI bleed in a patient with bloody stool. CTA of the abdomen demonstrates active bleeding likely secondary to diverticulosis in the sigmoid colon.  EXAM: Pelvic angiogram ANESTHESIA/SEDATION: Moderate (conscious) sedation was employed during this procedure. A total of Versed  1.5 mg and Fentanyl  75 mcg was administered intravenously by the radiology nurse. Total intra-service moderate Sedation Time: 30 minutes. The patient's level of consciousness and vital signs were monitored continuously by radiology nursing throughout the procedure under my direct supervision. CONTRAST:  53 mL Omnipaque  300 FLUOROSCOPY: Radiation Exposure Index (as provided by the fluoroscopic device): 3.8 minutes mGy Kerma COMPLICATIONS: None immediate. PROCEDURE: Informed consent was obtained from the patient following explanation of the procedure, risks, benefits and alternatives. The patient understands, agrees and consents for the procedure. All questions were addressed. A time out was performed prior to the initiation of the procedure. Maximal barrier sterile technique utilized including caps, mask, sterile gowns, sterile gloves, large sterile drape, hand hygiene, and Betadine  prep. Ultrasound demonstrated the right common femoral artery to be anechoic and pulsatile indicating patency. The arteriotomy site was anesthetized with 1% lidocaine . Small incision was made at the arteriotomy site and blunt dissection was then applied. The needle was advanced from the incision to the midpoint of the artery under ultrasound guidance. Final image was obtained and stored in patient's permanent medical record. Access was exchanged over an 018 guidewire for micropuncture sheath. Access was exchanged over an 035 wire for a 6 [place] sheath. Guidewire and catheter were then advanced to the abdominal aorta. The Sim 1 catheter tip was then reformed and used to selectively cannulate the IMA based on anatomic features. Catheter was then used to perform an angiogram of the IMA. No active extravasation identified. Coaxial technique was then used to select the left colic artery and 2 separate sigmoidal  arteries. In serial fashion the left colic artery was injected in order to perform a dedicated angiogram closer to the likely location of bleeding and no active extravasation identified. The microcatheter and guidewire were then used to selectively cannulate the first of 2 sigmoidal arteries. An angiogram of the first sigmoidal artery demonstrated no active bleeding. The catheter and guidewire were again used to selectively cannulate the second sigmoidal artery and a second angiogram of the sigmoidal arteries was performed also demonstrating no active extravasation. In order to demonstrate no further cross-filling, the superior rectal artery was then cannulated with the microcatheter and a separate superior rectal artery angiogram was performed also demonstrating no active bleeding. All catheters and  guidewires removed from the patient. Access was closed using Angio-Seal closure device. IMPRESSION: The IMA and its branches were freely investigated with angiograms and no active extravasation identified. No further plans for intervention. Electronically Signed   By: Cordella Banner   On: 01/02/2024 16:31   IR Angiogram Selective Each Additional Vessel Result Date: 01/02/2024 INDICATION: Acute GI bleed in a patient with bloody stool. CTA of the abdomen demonstrates active bleeding likely secondary to diverticulosis in the sigmoid colon. EXAM: Pelvic angiogram ANESTHESIA/SEDATION: Moderate (conscious) sedation was employed during this procedure. A total of Versed  1.5 mg and Fentanyl  75 mcg was administered intravenously by the radiology nurse. Total intra-service moderate Sedation Time: 30 minutes. The patient's level of consciousness and vital signs were monitored continuously by radiology nursing throughout the procedure under my direct supervision. CONTRAST:  53 mL Omnipaque  300 FLUOROSCOPY: Radiation Exposure Index (as provided by the fluoroscopic device): 3.8 minutes mGy Kerma COMPLICATIONS: None immediate.  PROCEDURE: Informed consent was obtained from the patient following explanation of the procedure, risks, benefits and alternatives. The patient understands, agrees and consents for the procedure. All questions were addressed. A time out was performed prior to the initiation of the procedure. Maximal barrier sterile technique utilized including caps, mask, sterile gowns, sterile gloves, large sterile drape, hand hygiene, and Betadine  prep. Ultrasound demonstrated the right common femoral artery to be anechoic and pulsatile indicating patency. The arteriotomy site was anesthetized with 1% lidocaine . Small incision was made at the arteriotomy site and blunt dissection was then applied. The needle was advanced from the incision to the midpoint of the artery under ultrasound guidance. Final image was obtained and stored in patient's permanent medical record. Access was exchanged over an 018 guidewire for micropuncture sheath. Access was exchanged over an 035 wire for a 6 [place] sheath. Guidewire and catheter were then advanced to the abdominal aorta. The Sim 1 catheter tip was then reformed and used to selectively cannulate the IMA based on anatomic features. Catheter was then used to perform an angiogram of the IMA. No active extravasation identified. Coaxial technique was then used to select the left colic artery and 2 separate sigmoidal arteries. In serial fashion the left colic artery was injected in order to perform a dedicated angiogram closer to the likely location of bleeding and no active extravasation identified. The microcatheter and guidewire were then used to selectively cannulate the first of 2 sigmoidal arteries. An angiogram of the first sigmoidal artery demonstrated no active bleeding. The catheter and guidewire were again used to selectively cannulate the second sigmoidal artery and a second angiogram of the sigmoidal arteries was performed also demonstrating no active extravasation. In order to  demonstrate no further cross-filling, the superior rectal artery was then cannulated with the microcatheter and a separate superior rectal artery angiogram was performed also demonstrating no active bleeding. All catheters and guidewires removed from the patient. Access was closed using Angio-Seal closure device. IMPRESSION: The IMA and its branches were freely investigated with angiograms and no active extravasation identified. No further plans for intervention. Electronically Signed   By: Cordella Banner   On: 01/02/2024 16:31   IR Angiogram Selective Each Additional Vessel Result Date: 01/02/2024 INDICATION: Acute GI bleed in a patient with bloody stool. CTA of the abdomen demonstrates active bleeding likely secondary to diverticulosis in the sigmoid colon. EXAM: Pelvic angiogram ANESTHESIA/SEDATION: Moderate (conscious) sedation was employed during this procedure. A total of Versed  1.5 mg and Fentanyl  75 mcg was administered intravenously by the radiology nurse.  Total intra-service moderate Sedation Time: 30 minutes. The patient's level of consciousness and vital signs were monitored continuously by radiology nursing throughout the procedure under my direct supervision. CONTRAST:  53 mL Omnipaque  300 FLUOROSCOPY: Radiation Exposure Index (as provided by the fluoroscopic device): 3.8 minutes mGy Kerma COMPLICATIONS: None immediate. PROCEDURE: Informed consent was obtained from the patient following explanation of the procedure, risks, benefits and alternatives. The patient understands, agrees and consents for the procedure. All questions were addressed. A time out was performed prior to the initiation of the procedure. Maximal barrier sterile technique utilized including caps, mask, sterile gowns, sterile gloves, large sterile drape, hand hygiene, and Betadine  prep. Ultrasound demonstrated the right common femoral artery to be anechoic and pulsatile indicating patency. The arteriotomy site was anesthetized  with 1% lidocaine . Small incision was made at the arteriotomy site and blunt dissection was then applied. The needle was advanced from the incision to the midpoint of the artery under ultrasound guidance. Final image was obtained and stored in patient's permanent medical record. Access was exchanged over an 018 guidewire for micropuncture sheath. Access was exchanged over an 035 wire for a 6 [place] sheath. Guidewire and catheter were then advanced to the abdominal aorta. The Sim 1 catheter tip was then reformed and used to selectively cannulate the IMA based on anatomic features. Catheter was then used to perform an angiogram of the IMA. No active extravasation identified. Coaxial technique was then used to select the left colic artery and 2 separate sigmoidal arteries. In serial fashion the left colic artery was injected in order to perform a dedicated angiogram closer to the likely location of bleeding and no active extravasation identified. The microcatheter and guidewire were then used to selectively cannulate the first of 2 sigmoidal arteries. An angiogram of the first sigmoidal artery demonstrated no active bleeding. The catheter and guidewire were again used to selectively cannulate the second sigmoidal artery and a second angiogram of the sigmoidal arteries was performed also demonstrating no active extravasation. In order to demonstrate no further cross-filling, the superior rectal artery was then cannulated with the microcatheter and a separate superior rectal artery angiogram was performed also demonstrating no active bleeding. All catheters and guidewires removed from the patient. Access was closed using Angio-Seal closure device. IMPRESSION: The IMA and its branches were freely investigated with angiograms and no active extravasation identified. No further plans for intervention. Electronically Signed   By: Cordella Banner   On: 01/02/2024 16:31   IR Angiogram Visceral Selective Result Date:  01/02/2024 INDICATION: Acute GI bleed in a patient with bloody stool. CTA of the abdomen demonstrates active bleeding likely secondary to diverticulosis in the sigmoid colon. EXAM: Pelvic angiogram ANESTHESIA/SEDATION: Moderate (conscious) sedation was employed during this procedure. A total of Versed  1.5 mg and Fentanyl  75 mcg was administered intravenously by the radiology nurse. Total intra-service moderate Sedation Time: 30 minutes. The patient's level of consciousness and vital signs were monitored continuously by radiology nursing throughout the procedure under my direct supervision. CONTRAST:  53 mL Omnipaque  300 FLUOROSCOPY: Radiation Exposure Index (as provided by the fluoroscopic device): 3.8 minutes mGy Kerma COMPLICATIONS: None immediate. PROCEDURE: Informed consent was obtained from the patient following explanation of the procedure, risks, benefits and alternatives. The patient understands, agrees and consents for the procedure. All questions were addressed. A time out was performed prior to the initiation of the procedure. Maximal barrier sterile technique utilized including caps, mask, sterile gowns, sterile gloves, large sterile drape, hand hygiene, and Betadine  prep.  Ultrasound demonstrated the right common femoral artery to be anechoic and pulsatile indicating patency. The arteriotomy site was anesthetized with 1% lidocaine . Small incision was made at the arteriotomy site and blunt dissection was then applied. The needle was advanced from the incision to the midpoint of the artery under ultrasound guidance. Final image was obtained and stored in patient's permanent medical record. Access was exchanged over an 018 guidewire for micropuncture sheath. Access was exchanged over an 035 wire for a 6 [place] sheath. Guidewire and catheter were then advanced to the abdominal aorta. The Sim 1 catheter tip was then reformed and used to selectively cannulate the IMA based on anatomic features. Catheter was  then used to perform an angiogram of the IMA. No active extravasation identified. Coaxial technique was then used to select the left colic artery and 2 separate sigmoidal arteries. In serial fashion the left colic artery was injected in order to perform a dedicated angiogram closer to the likely location of bleeding and no active extravasation identified. The microcatheter and guidewire were then used to selectively cannulate the first of 2 sigmoidal arteries. An angiogram of the first sigmoidal artery demonstrated no active bleeding. The catheter and guidewire were again used to selectively cannulate the second sigmoidal artery and a second angiogram of the sigmoidal arteries was performed also demonstrating no active extravasation. In order to demonstrate no further cross-filling, the superior rectal artery was then cannulated with the microcatheter and a separate superior rectal artery angiogram was performed also demonstrating no active bleeding. All catheters and guidewires removed from the patient. Access was closed using Angio-Seal closure device. IMPRESSION: The IMA and its branches were freely investigated with angiograms and no active extravasation identified. No further plans for intervention. Electronically Signed   By: Cordella Banner   On: 01/02/2024 16:31      Keyvon Herter T. Leshay Desaulniers [facility]  If 7PM-7AM, please contact night-coverage [email or web address] 01/03/2024, 12:38 PM

## 2024-01-04 ENCOUNTER — Inpatient Hospital Stay (HOSPITAL_COMMUNITY): Admitting: Anesthesiology

## 2024-01-04 ENCOUNTER — Encounter (HOSPITAL_COMMUNITY): Payer: Self-pay | Admitting: Internal Medicine

## 2024-01-04 ENCOUNTER — Encounter (HOSPITAL_COMMUNITY)
Admission: EM | Disposition: A | Discharge status: Home Health | Payer: Self-pay | Source: Home / Self Care | Attending: Student

## 2024-01-04 DIAGNOSIS — Z87891 Personal history of nicotine dependence: Secondary | ICD-10-CM

## 2024-01-04 DIAGNOSIS — I2510 Atherosclerotic heart disease of native coronary artery without angina pectoris: Secondary | ICD-10-CM

## 2024-01-04 DIAGNOSIS — K5791 Diverticulosis of intestine, part unspecified, without perforation or abscess with bleeding: Secondary | ICD-10-CM | POA: Diagnosis not present

## 2024-01-04 DIAGNOSIS — K5730 Diverticulosis of large intestine without perforation or abscess without bleeding: Secondary | ICD-10-CM

## 2024-01-04 DIAGNOSIS — D62 Acute posthemorrhagic anemia: Secondary | ICD-10-CM | POA: Diagnosis not present

## 2024-01-04 DIAGNOSIS — K920 Hematemesis: Secondary | ICD-10-CM

## 2024-01-04 DIAGNOSIS — I10 Essential (primary) hypertension: Secondary | ICD-10-CM

## 2024-01-04 DIAGNOSIS — K921 Melena: Secondary | ICD-10-CM

## 2024-01-04 DIAGNOSIS — K5731 Diverticulosis of large intestine without perforation or abscess with bleeding: Secondary | ICD-10-CM

## 2024-01-04 DIAGNOSIS — K644 Residual hemorrhoidal skin tags: Secondary | ICD-10-CM

## 2024-01-04 DIAGNOSIS — K648 Other hemorrhoids: Secondary | ICD-10-CM

## 2024-01-04 HISTORY — PX: COLONOSCOPY: SHX5424

## 2024-01-04 LAB — RENAL FUNCTION PANEL
Albumin: 2.9 g/dL — ABNORMAL LOW (ref 3.5–5.0)
Anion gap: 8 (ref 5–15)
BUN: 31 mg/dL — ABNORMAL HIGH (ref 8–23)
CO2: 22 mmol/L (ref 22–32)
Calcium: 8.2 mg/dL — ABNORMAL LOW (ref 8.9–10.3)
Chloride: 110 mmol/L (ref 98–111)
Creatinine, Ser: 1.31 mg/dL — ABNORMAL HIGH (ref 0.44–1.00)
GFR, Estimated: 40 mL/min — ABNORMAL LOW (ref >60)
Glucose, Bld: 129 mg/dL — ABNORMAL HIGH (ref 70–99)
Phosphorus: 3.5 mg/dL (ref 2.5–4.6)
Potassium: 3.9 mmol/L (ref 3.5–5.1)
Sodium: 140 mmol/L (ref 135–145)

## 2024-01-04 LAB — GLUCOSE, CAPILLARY
Glucose-Capillary: 123 mg/dL — ABNORMAL HIGH (ref 70–99)
Glucose-Capillary: 128 mg/dL — ABNORMAL HIGH (ref 70–99)
Glucose-Capillary: 97 mg/dL (ref 70–99)
Glucose-Capillary: 116 mg/dL — ABNORMAL HIGH (ref 70–99)
Glucose-Capillary: 126 mg/dL — ABNORMAL HIGH (ref 70–99)

## 2024-01-04 LAB — CBC
HCT: 24.6 % — ABNORMAL LOW (ref 36.0–46.0)
Hemoglobin: 7.9 g/dL — ABNORMAL LOW (ref 12.0–15.0)
MCH: 29.4 pg (ref 26.0–34.0)
MCHC: 32.1 g/dL (ref 30.0–36.0)
MCV: 91.4 fL (ref 80.0–100.0)
Platelets: 231 K/uL (ref 150–400)
RBC: 2.69 MIL/uL — ABNORMAL LOW (ref 3.87–5.11)
RDW: 14.6 % (ref 11.5–15.5)
WBC: 10.3 K/uL (ref 4.0–10.5)
nRBC: 0 % (ref 0.0–0.2)

## 2024-01-04 LAB — MAGNESIUM: Magnesium: 1.9 mg/dL (ref 1.7–2.4)

## 2024-01-04 SURGERY — COLONOSCOPY
Anesthesia: Monitor Anesthesia Care

## 2024-01-04 MED ORDER — IRON SUCROSE 300 MG IVPB - SIMPLE MED
300.0000 mg | Freq: Once | Status: AC
  Administered 2024-01-04: 300 mg via INTRAVENOUS
  Filled 2024-01-04: qty 300

## 2024-01-04 MED ORDER — PHENYLEPHRINE HCL (PRESSORS) 10 MG/ML IV SOLN
INTRAVENOUS | Status: DC | PRN
  Administered 2024-01-04: 80 ug via INTRAVENOUS

## 2024-01-04 MED ORDER — LIDOCAINE HCL (PF) 2 % IJ SOLN
INTRAMUSCULAR | Status: DC | PRN
  Administered 2024-01-04: 50 mg via INTRADERMAL

## 2024-01-04 MED ORDER — PROPOFOL 500 MG/50ML IV EMUL
INTRAVENOUS | Status: DC | PRN
  Administered 2024-01-04: 100 ug/kg/min via INTRAVENOUS

## 2024-01-04 MED ORDER — SODIUM CHLORIDE 0.9 % IV SOLN: INTRAVENOUS | Status: DC | PRN

## 2024-01-04 MED ORDER — PROPOFOL 10 MG/ML IV BOLUS
INTRAVENOUS | Status: DC | PRN
  Administered 2024-01-04 (×2): 20 mg via INTRAVENOUS

## 2024-01-04 NOTE — Anesthesia Postprocedure Evaluation (Signed)
 Anesthesia Post Note  Patient: Brittany Mcclure  Procedure(s) Performed: COLONOSCOPY     Patient location during evaluation: PACU Anesthesia Type: MAC Level of consciousness: awake and alert Pain management: pain level controlled Vital Signs Assessment: post-procedure vital signs reviewed and stable Respiratory status: spontaneous breathing, nonlabored ventilation, respiratory function stable and patient connected to nasal cannula oxygen Cardiovascular status: stable and blood pressure returned to baseline Postop Assessment: no apparent nausea or vomiting Anesthetic complications: no   No notable events documented.  Last Vitals:  Vitals:   01/04/24 1210 01/04/24 1222  BP: (!) 148/53 118/60  Pulse: 85 82  Resp: 17 16  Temp:  36.4 C  SpO2: 94% 98%    Last Pain:  Vitals:   01/04/24 1222  TempSrc: Oral  PainSc: 0-No pain                 Thom JONELLE Peoples

## 2024-01-04 NOTE — Anesthesia Preprocedure Evaluation (Signed)
 Anesthesia Evaluation  Patient identified by MRN, date of birth, ID band Patient awake    Reviewed: Allergy & Precautions, NPO status , Patient's Chart, lab work & pertinent test results  History of Anesthesia Complications Negative for: history of anesthetic complications  Airway Mallampati: III  TM Distance: >3 FB Neck ROM: Full    Dental no notable dental hx. (+) Missing, Chipped,    Pulmonary neg pulmonary ROS, neg sleep apnea, neg COPD, Patient abstained from smoking.Not current smoker, former smoker   Pulmonary exam normal breath sounds clear to auscultation       Cardiovascular Exercise Tolerance: Good METShypertension, Pt. on medications + CAD  (-) Past MI Normal cardiovascular exam(-) dysrhythmias  Rhythm:Regular Rate:Normal  EKG 08/31/23: Normal sinus rhythm. Rate 73. Nonspecific T wave abnormality   Cath 08/31/23: Conclusions: 1. Moderate to severe but not critical single-vessel CAD with 70-80% stenosis of mid RCA that is not hemodynamically significant (RFR = 0.92).  Mild, non-obstructive disease noted in proximal to mid LAD with 30-40% stenosis.  20% ostial ramus intermedius stenosis noted.  Small LCx without significant disease. 2. Normal left ventricular filling pressure (LVEDP 10-15 mmHg).   Recommendations: 1. Initiate antianginal therapy; will add amlodipine  2.5 mg daily. 2. Consider adding PCSK9 inhibitor to prevent progression of CAD, given statin intolerance. 3. Consider holding clopidogrel  given that PCI was not performed and patient is scheduled for gynecologic surgery next month; will defer to primary cardiology team.    Neuro/Psych  PSYCHIATRIC DISORDERS Anxiety Depression    negative neurological ROS     GI/Hepatic PUD,GERD  Medicated and Controlled,,(+)     (-) substance abuse  Tubular adenoma of colon Lower gastrointestinal bleeding Hg 7.9   Endo/Other  diabetes, Well Controlled, Oral  Hypoglycemic Agents    Renal/GU Renal InsufficiencyRenal disease     Musculoskeletal  (+) Arthritis ,    Abdominal  (+) + obese  Peds  Hematology  (+) Blood dyscrasia, anemia Hg 7.9   Anesthesia Other Findings   Reproductive/Obstetrics                              Anesthesia Physical Anesthesia Plan  ASA: 3  Anesthesia Plan: MAC   Post-op Pain Management:    Induction: Intravenous  PONV Risk Score and Plan: 3 and Treatment may vary due to age or medical condition and TIVA  Airway Management Planned: Natural Airway and Simple Face Mask  Additional Equipment: None  Intra-op Plan:   Post-operative Plan:   Informed Consent: I have reviewed the patients History and Physical, chart, labs and discussed the procedure including the risks, benefits and alternatives for the proposed anesthesia with the patient or authorized representative who has indicated his/her understanding and acceptance.     Dental advisory given  Plan Discussed with: CRNA  Anesthesia Plan Comments: (Discussed risks of anesthesia with patient, including PONV, sore throat, lip/dental/eye damage, post operative cognitive dysfunction. Rare risks discussed as well, such as cardiorespiratory and neurological sequelae, and allergic reactions. Discussed the role of CRNA in patient's perioperative care. Patient understands.)         Anesthesia Quick Evaluation

## 2024-01-04 NOTE — Interval H&P Note (Signed)
 History and Physical Interval Note:  01/04/2024 11:11 AM  Brittany Mcclure  has presented today for surgery, with the diagnosis of Lower gastrointestinal bleeding.  The various methods of treatment have been discussed with the patient and family. After consideration of risks, benefits and other options for treatment, the patient has consented to  Procedure(s): COLONOSCOPY (N/A) as a surgical intervention.  The patient's history has been reviewed, patient examined, no change in status, stable for surgery.  I have reviewed the patient's chart and labs.  Questions were answered to the patient's satisfaction.     Sweet Jarvis

## 2024-01-04 NOTE — Progress Notes (Addendum)
 Referring Physician(s): Pokhrel, Laxman, Nandigam, Kavitha   Supervising Physician: Jennefer Rover  Patient Status:  [facility] - In-pt  Chief Complaint:  GIB S/p IMA angiogram by Dr. Jenna on 10/14, no active bleeding found   Subjective:  Patient laying in bed, NAD. Denies sx of acute blood loss, she underwent bowel prep overnight for colonoscopy.  She is not sure if her BM looked bloody.  Has breakfast tray in room, patient states that she asked RN if she is allowed to eat as she know she need to be empty stomach for colonoscopy. RN checking with MD.  Informed patient that she should not eat her breakfast unless told otherwise by RN, she was told she can have sips of water or some ice chips. She verbalized understanding.   Allergies: Statins, Sulfa antibiotics, and Zetia  [ezetimibe ]  Medications: Prior to Admission medications   Medication Sig Start Date End Date Taking? Authorizing Provider  acetaminophen  (TYLENOL ) 500 MG tablet Take 500 mg by mouth every morning.   Yes [provider]  acetaminophen  (TYLENOL ) 650 MG CR tablet Take 650 mg by mouth at bedtime.   Yes [provider]  ALPRAZolam  (XANAX ) 0.25 MG tablet TAKE ONE TABLET BY MOUTH TWICE DAILY AS NEEDED Patient taking differently: Take 0.25 mg by mouth daily. 09/25/23  Yes Mast, Man X, NP  amLODipine  (NORVASC ) 2.5 MG tablet Take 1 tablet (2.5 mg total) by mouth daily. 08/31/23 08/30/24 Yes End, Lonni, MD  aspirin  EC 81 MG tablet Take 1 tablet (81 mg total) by mouth daily. Swallow whole. 08/28/23  Yes Chandrasekhar, Mahesh A, MD  Cholecalciferol (VITAMIN D3) 125 MCG (5000 UT) TABS Take 5,000 Units by mouth daily.   Yes [provider]  clopidogrel  (PLAVIX ) 75 MG tablet Take 1 tablet (75 mg total) by mouth daily. 07/17/23  Yes Thukkani, Arun K, MD  esomeprazole  (NEXIUM ) 40 MG capsule Take 1 capsule (40 mg total) by mouth daily. 11/02/23  Yes [place], PA-C  FLUoxetine  (PROZAC ) 40 MG capsule TAKE  ONE CAPSULE BY MOUTH EVERY DAY. 10/30/23  Yes Mast, Man X, NP  glipiZIDE  (GLUCOTROL ) 5 MG tablet Take 1 tablet (5 mg total) by mouth 2 (two) times daily before a meal. 10/30/23  Yes Mast, Man X, NP  Glycerin-Hypromellose-PEG 400 (DRY EYE RELIEF DROPS OP) Place 1 drop into both eyes daily as needed (Dry eye).   Yes [provider]  Multiple Vitamins-Minerals (PRESERVISION AREDS PO) Take 1 tablet by mouth in the morning and at bedtime.   Yes [provider]  Omega-3 Fatty Acids (FISH OIL) 1000 MG CAPS Take 1,000 mg by mouth daily.   Yes [provider]  sucralfate  (CARAFATE ) 1 g tablet Take 1 tablet (1 g total) by mouth 3 (three) times daily as needed. 11/02/23 04/30/24 Yes [place], PA-C  triamcinolone  cream (KENALOG ) 0.1 % Apply 1 Application topically 2 (two) times daily. Apply 2 times daily to the back for up to 2 weeks then STOP, Resume in 2 weeks if irritation is not resolved Patient taking differently: Apply 1 Application topically as needed (skin irritation). Apply 2 times daily to the back for up to 2 weeks then STOP, Resume in 2 weeks if irritation is not resolved 06/13/23  Yes Alm Delon SAILOR, DO  Accu-Chek FastClix Lancets MISC USE AS DIRECTED. 04/18/22   Mast, Man X, NP  glucose blood (ACCU-CHEK SMARTVIEW) test strip CHECK BLOOD SUGAR 3 TIMES DAILY AS DIRECTED. 04/18/22   Mast, Man X, NP  Vital Signs: BP (!) 155/65 (BP Location: Right Arm)   Pulse 97   Temp 97.9 F (36.6 C) (Oral)   Resp 14   Ht 5' 1 (1.549 m)   Wt 192 lb 14.4 oz (87.5 kg)   SpO2 99%   BMI 36.45 kg/m   Physical Exam Vitals reviewed.  Constitutional:      General: She is not in acute distress.    Appearance: She is not ill-appearing.  HENT:     Head: Normocephalic and atraumatic.  Pulmonary:     Effort: Pulmonary effort is normal.  Abdominal:     General: Abdomen is flat.     Palpations: Abdomen is soft.  Skin:    General: Skin is warm and dry.     Coloration: Skin is  not jaundiced or pale.     Comments: Positive dressing on R CFA puncture site. Site is unremarkable with no erythema, edema, tenderness, bleeding or drainage. Minimal amount of old, dry blood noted on the dressing. Dressing otherwise clean, dry, and intact. R DP 1+.   Neurological:     Mental Status: She is alert.  Psychiatric:        Mood and Affect: Mood normal.        Behavior: Behavior normal.     Imaging: IR Angiogram Selective Each Additional Vessel Result Date: 01/02/2024 INDICATION: Acute GI bleed in a patient with bloody stool. CTA of the abdomen demonstrates active bleeding likely secondary to diverticulosis in the sigmoid colon. EXAM: Pelvic angiogram ANESTHESIA/SEDATION: Moderate (conscious) sedation was employed during this procedure. A total of Versed  1.5 mg and Fentanyl  75 mcg was administered intravenously by the radiology nurse. Total intra-service moderate Sedation Time: 30 minutes. The patient's level of consciousness and vital signs were monitored continuously by radiology nursing throughout the procedure under my direct supervision. CONTRAST:  53 mL Omnipaque  300 FLUOROSCOPY: Radiation Exposure Index (as provided by the fluoroscopic device): 3.8 minutes mGy Kerma COMPLICATIONS: None immediate. PROCEDURE: Informed consent was obtained from the patient following explanation of the procedure, risks, benefits and alternatives. The patient understands, agrees and consents for the procedure. All questions were addressed. A time out was performed prior to the initiation of the procedure. Maximal barrier sterile technique utilized including caps, mask, sterile gowns, sterile gloves, large sterile drape, hand hygiene, and Betadine  prep. Ultrasound demonstrated the right common femoral artery to be anechoic and pulsatile indicating patency. The arteriotomy site was anesthetized with 1% lidocaine . Small incision was made at the arteriotomy site and blunt dissection was then applied. The  needle was advanced from the incision to the midpoint of the artery under ultrasound guidance. Final image was obtained and stored in patient's permanent medical record. Access was exchanged over an 018 guidewire for micropuncture sheath. Access was exchanged over an 035 wire for a 6 [place] sheath. Guidewire and catheter were then advanced to the abdominal aorta. The Sim 1 catheter tip was then reformed and used to selectively cannulate the IMA based on anatomic features. Catheter was then used to perform an angiogram of the IMA. No active extravasation identified. Coaxial technique was then used to select the left colic artery and 2 separate sigmoidal arteries. In serial fashion the left colic artery was injected in order to perform a dedicated angiogram closer to the likely location of bleeding and no active extravasation identified. The microcatheter and guidewire were then used to selectively cannulate the first of 2 sigmoidal arteries. An angiogram of the first sigmoidal artery demonstrated no active bleeding.  The catheter and guidewire were again used to selectively cannulate the second sigmoidal artery and a second angiogram of the sigmoidal arteries was performed also demonstrating no active extravasation. In order to demonstrate no further cross-filling, the superior rectal artery was then cannulated with the microcatheter and a separate superior rectal artery angiogram was performed also demonstrating no active bleeding. All catheters and guidewires removed from the patient. Access was closed using Angio-Seal closure device. IMPRESSION: The IMA and its branches were freely investigated with angiograms and no active extravasation identified. No further plans for intervention. Electronically Signed   By: Cordella Banner   On: 01/02/2024 16:31   IR Angiogram Selective Each Additional Vessel Result Date: 01/02/2024 INDICATION: Acute GI bleed in a patient with bloody stool. CTA of the abdomen demonstrates  active bleeding likely secondary to diverticulosis in the sigmoid colon. EXAM: Pelvic angiogram ANESTHESIA/SEDATION: Moderate (conscious) sedation was employed during this procedure. A total of Versed  1.5 mg and Fentanyl  75 mcg was administered intravenously by the radiology nurse. Total intra-service moderate Sedation Time: 30 minutes. The patient's level of consciousness and vital signs were monitored continuously by radiology nursing throughout the procedure under my direct supervision. CONTRAST:  53 mL Omnipaque  300 FLUOROSCOPY: Radiation Exposure Index (as provided by the fluoroscopic device): 3.8 minutes mGy Kerma COMPLICATIONS: None immediate. PROCEDURE: Informed consent was obtained from the patient following explanation of the procedure, risks, benefits and alternatives. The patient understands, agrees and consents for the procedure. All questions were addressed. A time out was performed prior to the initiation of the procedure. Maximal barrier sterile technique utilized including caps, mask, sterile gowns, sterile gloves, large sterile drape, hand hygiene, and Betadine  prep. Ultrasound demonstrated the right common femoral artery to be anechoic and pulsatile indicating patency. The arteriotomy site was anesthetized with 1% lidocaine . Small incision was made at the arteriotomy site and blunt dissection was then applied. The needle was advanced from the incision to the midpoint of the artery under ultrasound guidance. Final image was obtained and stored in patient's permanent medical record. Access was exchanged over an 018 guidewire for micropuncture sheath. Access was exchanged over an 035 wire for a 6 [place] sheath. Guidewire and catheter were then advanced to the abdominal aorta. The Sim 1 catheter tip was then reformed and used to selectively cannulate the IMA based on anatomic features. Catheter was then used to perform an angiogram of the IMA. No active extravasation identified. Coaxial technique was  then used to select the left colic artery and 2 separate sigmoidal arteries. In serial fashion the left colic artery was injected in order to perform a dedicated angiogram closer to the likely location of bleeding and no active extravasation identified. The microcatheter and guidewire were then used to selectively cannulate the first of 2 sigmoidal arteries. An angiogram of the first sigmoidal artery demonstrated no active bleeding. The catheter and guidewire were again used to selectively cannulate the second sigmoidal artery and a second angiogram of the sigmoidal arteries was performed also demonstrating no active extravasation. In order to demonstrate no further cross-filling, the superior rectal artery was then cannulated with the microcatheter and a separate superior rectal artery angiogram was performed also demonstrating no active bleeding. All catheters and guidewires removed from the patient. Access was closed using Angio-Seal closure device. IMPRESSION: The IMA and its branches were freely investigated with angiograms and no active extravasation identified. No further plans for intervention. Electronically Signed   By: Cordella Banner   On: 01/02/2024 16:31  IR Angiogram Selective Each Additional Vessel Result Date: 01/02/2024 INDICATION: Acute GI bleed in a patient with bloody stool. CTA of the abdomen demonstrates active bleeding likely secondary to diverticulosis in the sigmoid colon. EXAM: Pelvic angiogram ANESTHESIA/SEDATION: Moderate (conscious) sedation was employed during this procedure. A total of Versed  1.5 mg and Fentanyl  75 mcg was administered intravenously by the radiology nurse. Total intra-service moderate Sedation Time: 30 minutes. The patient's level of consciousness and vital signs were monitored continuously by radiology nursing throughout the procedure under my direct supervision. CONTRAST:  53 mL Omnipaque  300 FLUOROSCOPY: Radiation Exposure Index (as provided by the  fluoroscopic device): 3.8 minutes mGy Kerma COMPLICATIONS: None immediate. PROCEDURE: Informed consent was obtained from the patient following explanation of the procedure, risks, benefits and alternatives. The patient understands, agrees and consents for the procedure. All questions were addressed. A time out was performed prior to the initiation of the procedure. Maximal barrier sterile technique utilized including caps, mask, sterile gowns, sterile gloves, large sterile drape, hand hygiene, and Betadine  prep. Ultrasound demonstrated the right common femoral artery to be anechoic and pulsatile indicating patency. The arteriotomy site was anesthetized with 1% lidocaine . Small incision was made at the arteriotomy site and blunt dissection was then applied. The needle was advanced from the incision to the midpoint of the artery under ultrasound guidance. Final image was obtained and stored in patient's permanent medical record. Access was exchanged over an 018 guidewire for micropuncture sheath. Access was exchanged over an 035 wire for a 6 [place] sheath. Guidewire and catheter were then advanced to the abdominal aorta. The Sim 1 catheter tip was then reformed and used to selectively cannulate the IMA based on anatomic features. Catheter was then used to perform an angiogram of the IMA. No active extravasation identified. Coaxial technique was then used to select the left colic artery and 2 separate sigmoidal arteries. In serial fashion the left colic artery was injected in order to perform a dedicated angiogram closer to the likely location of bleeding and no active extravasation identified. The microcatheter and guidewire were then used to selectively cannulate the first of 2 sigmoidal arteries. An angiogram of the first sigmoidal artery demonstrated no active bleeding. The catheter and guidewire were again used to selectively cannulate the second sigmoidal artery and a second angiogram of the sigmoidal arteries  was performed also demonstrating no active extravasation. In order to demonstrate no further cross-filling, the superior rectal artery was then cannulated with the microcatheter and a separate superior rectal artery angiogram was performed also demonstrating no active bleeding. All catheters and guidewires removed from the patient. Access was closed using Angio-Seal closure device. IMPRESSION: The IMA and its branches were freely investigated with angiograms and no active extravasation identified. No further plans for intervention. Electronically Signed   By: Cordella Banner   On: 01/02/2024 16:31   IR Angiogram Visceral Selective Result Date: 01/02/2024 INDICATION: Acute GI bleed in a patient with bloody stool. CTA of the abdomen demonstrates active bleeding likely secondary to diverticulosis in the sigmoid colon. EXAM: Pelvic angiogram ANESTHESIA/SEDATION: Moderate (conscious) sedation was employed during this procedure. A total of Versed  1.5 mg and Fentanyl  75 mcg was administered intravenously by the radiology nurse. Total intra-service moderate Sedation Time: 30 minutes. The patient's level of consciousness and vital signs were monitored continuously by radiology nursing throughout the procedure under my direct supervision. CONTRAST:  53 mL Omnipaque  300 FLUOROSCOPY: Radiation Exposure Index (as provided by the fluoroscopic device): 3.8 minutes mGy Kerma COMPLICATIONS: None  immediate. PROCEDURE: Informed consent was obtained from the patient following explanation of the procedure, risks, benefits and alternatives. The patient understands, agrees and consents for the procedure. All questions were addressed. A time out was performed prior to the initiation of the procedure. Maximal barrier sterile technique utilized including caps, mask, sterile gowns, sterile gloves, large sterile drape, hand hygiene, and Betadine  prep. Ultrasound demonstrated the right common femoral artery to be anechoic and pulsatile  indicating patency. The arteriotomy site was anesthetized with 1% lidocaine . Small incision was made at the arteriotomy site and blunt dissection was then applied. The needle was advanced from the incision to the midpoint of the artery under ultrasound guidance. Final image was obtained and stored in patient's permanent medical record. Access was exchanged over an 018 guidewire for micropuncture sheath. Access was exchanged over an 035 wire for a 6 [place] sheath. Guidewire and catheter were then advanced to the abdominal aorta. The Sim 1 catheter tip was then reformed and used to selectively cannulate the IMA based on anatomic features. Catheter was then used to perform an angiogram of the IMA. No active extravasation identified. Coaxial technique was then used to select the left colic artery and 2 separate sigmoidal arteries. In serial fashion the left colic artery was injected in order to perform a dedicated angiogram closer to the likely location of bleeding and no active extravasation identified. The microcatheter and guidewire were then used to selectively cannulate the first of 2 sigmoidal arteries. An angiogram of the first sigmoidal artery demonstrated no active bleeding. The catheter and guidewire were again used to selectively cannulate the second sigmoidal artery and a second angiogram of the sigmoidal arteries was performed also demonstrating no active extravasation. In order to demonstrate no further cross-filling, the superior rectal artery was then cannulated with the microcatheter and a separate superior rectal artery angiogram was performed also demonstrating no active bleeding. All catheters and guidewires removed from the patient. Access was closed using Angio-Seal closure device. IMPRESSION: The IMA and its branches were freely investigated with angiograms and no active extravasation identified. No further plans for intervention. Electronically Signed   By: Cordella Banner   On: 01/02/2024 16:31    CT ANGIO GI BLEED Result Date: 01/02/2024 CLINICAL DATA:  Acute Lower GI bleed EXAM: CTA ABDOMEN AND PELVIS WITHOUT AND WITH CONTRAST TECHNIQUE: Initially, a noncontrast CT of the abdomen and pelvis was performed. Subsequently, multidetector CT imaging of the abdomen and pelvis was performed using the standard protocol during bolus administration of intravenous contrast. Multiplanar reconstructed images and MIPs were obtained and reviewed to evaluate the vascular anatomy. RADIATION DOSE REDUCTION: This exam was performed according to the departmental dose-optimization program which includes automated exposure control, adjustment of the mA and/or kV according to patient size and/or use of iterative reconstruction technique. CONTRAST:  OMNIPAQUE  IOHEXOL  350 MG/ML SOLN COMPARISON:  None Available. FINDINGS: VASCULAR Aorta: No aortic aneurysm or dissection. Calcified and noncalcified plaque scattered throughout the aorta. No hemodynamically significant stenosis. Celiac: Patent without acute thrombus, aneurysm, or dissection.No hemodynamically significant stenosis. SMA: Patent without acute thrombus, aneurysm, or dissection.Mild-to-moderate narrowing of the proximal SMA from noncalcified plaque. Renals: Patent without acute thrombus, aneurysm, or dissection.Severe narrowing of the right renal artery ostium from mixed density plaque. IMA: Patent without acute thrombus, aneurysm, or dissection.No hemodynamically significant stenosis. Inflow: Patent without acute thrombus, aneurysm, or dissection.No hemodynamically significant stenosis. Proximal Outflow: The bilateral common femoral and visualized portions of the superficial and profunda femoral arteries are patent without acute  thrombus, aneurysm, or dissection.No hemodynamically significant stenosis. Veins: No obvious venous abnormality within the limitations of this arterial phase study. Review of the MIP images confirms the above findings. NON-VASCULAR Lower  chest: No focal airspace consolidation or pleural effusion. Hepatobiliary: The most superior aspects of the right and left hepatic lobes are excluded from field of view. No mass.Diffuse hepatic steatosis.No radiopaque stones or wall thickening of the gallbladder.No intrahepatic or extrahepatic biliary ductal dilation. Pancreas: No mass or main ductal dilation.No peripancreatic inflammation or fluid collection. Spleen: Normal size. No mass. Adrenals/Urinary Tract: Nodular thickening within both adrenal glands, more so on the left, without discrete mass or dominant nodule. Subcentimeter hypodensities are noted in the kidneys, too small to definitively characterize, but likely small cysts. No hydronephrosis or nephrolithiasis. The urinary bladder is distended without focal abnormality. Stomach/Bowel:Small hiatal hernia. The stomach is decompressed without focal abnormality. No small bowel wall thickening or inflammation. No small bowel obstruction.Normal appendix. Descending and sigmoid colonic diverticulosis. No changes of acute diverticulitis. GI Bleed: Active diverticular bleeding from the distal sigmoid colon. Lymphatic: No intraabdominal or pelvic lymphadenopathy. Reproductive: Age-related atrophy of the uterus and ovaries. No concerning adnexal mass.No free pelvic fluid. Other: No pneumoperitoneum, ascites, or mesenteric inflammation. Musculoskeletal: No acute fracture or destructive lesion.Diffuse osteopenia. Multilevel degenerative disc disease of the spine. IMPRESSION: VASCULAR No aortic aneurysm or aortic dissection. NON-VASCULAR 1. Active diverticular bleeding from the distal sigmoid colon. GI and surgical consultation recommended. 2. Descending and sigmoid colonic diverticulosis. No changes of acute diverticulitis. 3. Diffuse hepatic steatosis. Critical Value/emergent results were called by telephone at the time of interpretation on 01/02/2024 at 11:03 am to provider Soyla Body, MD, who verbally  acknowledged these results. Electronically Signed   By: Rogelia Myers M.D.   On: 01/02/2024 11:07    Labs:  CBC: Recent Labs    01/02/24 1104 01/02/24 1857 01/03/24 0506 01/03/24 1328 01/03/24 1728 01/04/24 0325  WBC 14.1*  --  10.3 11.5*  --  10.3  HGB 10.5*   < > 8.9* 8.3* 7.7* 7.9*  HCT 32.8*   < > 27.7* 25.8* 24.0* 24.6*  PLT 255  --  235 251  --  231   < > = values in this interval not displayed.    COAGS: Recent Labs    01/02/24 0726  INR 1.0  APTT 28    BMP: Recent Labs    11/06/23 0948 01/02/24 0726 01/03/24 0506 01/04/24 0325  NA 139 139 138 140  K 4.6 4.5 3.7 3.9  CL 104 105 108 110  CO2 22 23 20* 22  GLUCOSE 140* 142* 113* 129*  BUN 25* 26* 33* 31*  CALCIUM 9.2 9.0 8.4* 8.2*  CREATININE 1.27* 1.14* 1.31* 1.31*  GFRNONAA 41* 47* 40* 40*    LIVER FUNCTION TESTS: Recent Labs    03/29/23 1105 01/02/24 0726 01/04/24 0325  BILITOT 0.4 0.7  --   AST 21 20  --   ALT 22 15  --   ALKPHOS 85 108  --   PROT 6.6 6.3*  --   ALBUMIN 4.3 3.2* 2.9*    Assessment and Plan:  83 y.o. female with hx CAD on DAPT, gastric ulcer, diverticulosis presented with symptomatic GIB, she is s/p IMA angiogram by Dr. Jenna on 10/14, angiogram showed no active bleeding.  VSS Hgb trended down from 11.6 10/14 to 7.9 today - patient is scheduled for colonoscopy today RF stable  R CFA puncture site with no appreciable pseudoaneurysm or hematoma. R DP  1+.    Further treatment plan per TRH/GI Appreciate and agree with the plan.  Please call IR for questions and concerns.    Electronically Signed: Toya VEAR Cousin, PA-C 01/04/2024, 8:01 AM   I spent a total of 15 Minutes at the the patient's bedside AND on the patient's hospital floor or unit, greater than 50% of which was counseling/coordinating care for GIB f/u.   This chart was dictated using voice recognition software.  Despite best efforts to proofread,  errors can occur which can change the documentation  meaning.

## 2024-01-04 NOTE — Evaluation (Signed)
 Physical Therapy Evaluation Patient Details Name: Brittany Mcclure MRN: [id number] DOB: Mar 27, 1940 Today's Date: 01/04/2024  History of Present Illness  Patient is an 83 y/o female admitted 01/02/24 with BRBPR, CTA of abdomen positive for acute diverticular bleed distal sigmoid colon.  Underwent IR for IMA angoigram without active bleeding and colonoscopy 10/16 without active bleeding though many diverticula in sigmoid colon and descending colon.  PMH positive for h/o GERD and gastric ulcer, DM, HTN, CKD IIIa, CAD on DAPT.  Clinical Impression  Patient presents with decreased mobility due to generalized weakness with anemia and bedrest.  Previously pt independent at her ILF completing ADL's/IADL's and driving.  She was able to ambulate to the door and back to bed today limited with some weakness and noted BP drop sitting to standing.  She will benefit from skilled PT in the acute setting though feel she should be able to return to her ILF apartment with intermittent support from her daughter.  PT will continue to follow.  Sitting BP 172/94, HR 113; standing 113/52, HR 124 (denies dizziness)        If plan is discharge home, recommend the following: A little help with walking and/or transfers;Assistance with cooking/housework;Help with stairs or ramp for entrance;A little help with bathing/dressing/bathroom   Can travel by private vehicle        Equipment Recommendations None recommended by PT (states if needed has access to equipment at her facility)  Recommendations for Other Services       Functional Status Assessment Patient has had a recent decline in their functional status and demonstrates the ability to make significant improvements in function in a reasonable and predictable amount of time.     Precautions / Restrictions Precautions Precautions: Fall Precaution/Restrictions Comments: watch BP      Mobility  Bed Mobility Overal bed mobility: Needs Assistance Bed Mobility:  Supine to Sit, Sit to Supine     Supine to sit: Contact guard Sit to supine: Supervision   General bed mobility comments: using momentum to sit up, CGA for balance; to supine S for lines    Transfers Overall transfer level: Needs assistance   Transfers: Sit to/from Stand Sit to Stand: Supervision, Contact guard assist           General transfer comment: assist for lines    Ambulation/Gait Ambulation/Gait assistance: Min assist Gait Distance (Feet): 20 Feet Assistive device: 1 person hand held assist Gait Pattern/deviations: Step-through pattern, Decreased stride length       General Gait Details: to door and back with HHA for safety with BP drop in standing, no true physical help needed  Stairs            Wheelchair Mobility     Tilt Bed    Modified Rankin (Stroke Patients Only)       Balance Overall balance assessment: Mild deficits observed, not formally tested                                           Pertinent Vitals/Pain Pain Assessment Pain Assessment: No/denies pain    Home Living Family/patient expects to be discharged to:: Private residence Living Arrangements: Alone   Type of Home: Independent living facility Home Access: Elevator       Home Layout: One level Home Equipment: Grab bars - tub/shower;Grab bars - toilet;Cane - single point      Prior  Function Prior Level of Function : Independent/Modified Independent;Driving             Mobility Comments: takes one meal in dining room, prepares others in her apartment       Extremity/Trunk Assessment   Upper Extremity Assessment Upper Extremity Assessment: Overall WFL for tasks assessed    Lower Extremity Assessment Lower Extremity Assessment: Overall WFL for tasks assessed    Cervical / Trunk Assessment Cervical / Trunk Assessment: Other exceptions Cervical / Trunk Exceptions: h/o sciatica L LE symptoms  Communication    Communication Communication: No apparent difficulties    Cognition Arousal: Alert Behavior During Therapy: WFL for tasks assessed/performed   PT - Cognitive impairments: No apparent impairments                         Following commands: Intact       Cueing Cueing Techniques: Verbal cues     General Comments General comments (skin integrity, edema, etc.): seated BP 172/94, HR 113; standing 113/52 HR 124; denies symptoms    Exercises     Assessment/Plan    PT Assessment Patient needs continued PT services  PT Problem List Decreased strength;Decreased activity tolerance;Decreased mobility;Decreased balance       PT Treatment Interventions DME instruction;Gait training;Functional mobility training;Therapeutic activities;Therapeutic exercise;Balance training;Patient/family education    PT Goals (Current goals can be found in the Care Plan section)  Acute Rehab PT Goals Patient Stated Goal: return home PT Goal Formulation: With patient Time For Goal Achievement: 01/18/24 Potential to Achieve Goals: Good    Frequency Min 2X/week     Co-evaluation               AM-PAC PT 6 Clicks Mobility  Outcome Measure Help needed turning from your back to your side while in a flat bed without using bedrails?: A Little Help needed moving from lying on your back to sitting on the side of a flat bed without using bedrails?: A Little Help needed moving to and from a bed to a chair (including a wheelchair)?: A Little Help needed standing up from a chair using your arms (e.g., wheelchair or bedside chair)?: A Little Help needed to walk in hospital room?: A Little Help needed climbing 3-5 steps with a railing? : Total 6 Click Score: 16    End of Session Equipment Utilized During Treatment: Gait belt Activity Tolerance: Patient tolerated treatment well Patient left: in bed;with call bell/phone within reach Nurse Communication: Other (comment) (BP drop) PT Visit  Diagnosis: Muscle weakness (generalized) (M62.81)    Time: 8444-8382 PT Time Calculation (min) (ACUTE ONLY): 22 min   Charges:   PT Evaluation $PT Eval Moderate Complexity: 1 Mod   PT General Charges $$ ACUTE PT VISIT: 1 Visit         Micheline Portal, PT Acute Rehabilitation Services [phone] 01/04/2024   Montie Portal 01/04/2024, 4:48 PM

## 2024-01-04 NOTE — Progress Notes (Signed)
 Referring Physician(s): * No referring provider recorded for this case *  Supervising Physician: Hughes Simmonds  Patient Status:  [facility] - In-pt  Chief Complaint: GI bleed  Subjective: Resting comfortably in bed.  Reports LLQ pain intermittently, sometimes associated with bowel movements--- has had 2 large bloody bowel movements today.   Anticipating prep for colonoscopy tomorrow.  Daughter at bedside.   Allergies: Statins, Sulfa antibiotics, and Zetia  [ezetimibe ]  Medications: Prior to Admission medications   Medication Sig Start Date End Date Taking? Authorizing Provider  acetaminophen  (TYLENOL ) 500 MG tablet Take 500 mg by mouth every morning.   Yes [provider]  acetaminophen  (TYLENOL ) 650 MG CR tablet Take 650 mg by mouth at bedtime.   Yes [provider]  ALPRAZolam  (XANAX ) 0.25 MG tablet TAKE ONE TABLET BY MOUTH TWICE DAILY AS NEEDED Patient taking differently: Take 0.25 mg by mouth daily. 09/25/23  Yes Mast, Man X, NP  amLODipine  (NORVASC ) 2.5 MG tablet Take 1 tablet (2.5 mg total) by mouth daily. 08/31/23 08/30/24 Yes End, Lonni, MD  aspirin  EC 81 MG tablet Take 1 tablet (81 mg total) by mouth daily. Swallow whole. 08/28/23  Yes Chandrasekhar, Mahesh A, MD  Cholecalciferol (VITAMIN D3) 125 MCG (5000 UT) TABS Take 5,000 Units by mouth daily.   Yes [provider]  clopidogrel  (PLAVIX ) 75 MG tablet Take 1 tablet (75 mg total) by mouth daily. 07/17/23  Yes Thukkani, Arun K, MD  esomeprazole  (NEXIUM ) 40 MG capsule Take 1 capsule (40 mg total) by mouth daily. 11/02/23  Yes [place], PA-C  FLUoxetine  (PROZAC ) 40 MG capsule TAKE ONE CAPSULE BY MOUTH EVERY DAY. 10/30/23  Yes Mast, Man X, NP  glipiZIDE  (GLUCOTROL ) 5 MG tablet Take 1 tablet (5 mg total) by mouth 2 (two) times daily before a meal. 10/30/23  Yes Mast, Man X, NP  Glycerin-Hypromellose-PEG 400 (DRY EYE RELIEF DROPS OP) Place 1 drop into both eyes daily as needed (Dry eye).   Yes [provider]  Multiple Vitamins-Minerals (PRESERVISION AREDS PO) Take 1 tablet by mouth in the morning and at bedtime.   Yes [provider]  Omega-3 Fatty Acids (FISH OIL) 1000 MG CAPS Take 1,000 mg by mouth daily.   Yes [provider]  sucralfate  (CARAFATE ) 1 g tablet Take 1 tablet (1 g total) by mouth 3 (three) times daily as needed. 11/02/23 04/30/24 Yes [place], PA-C  triamcinolone  cream (KENALOG ) 0.1 % Apply 1 Application topically 2 (two) times daily. Apply 2 times daily to the back for up to 2 weeks then STOP, Resume in 2 weeks if irritation is not resolved Patient taking differently: Apply 1 Application topically as needed (skin irritation). Apply 2 times daily to the back for up to 2 weeks then STOP, Resume in 2 weeks if irritation is not resolved 06/13/23  Yes Alm Delon SAILOR, DO  Accu-Chek FastClix Lancets MISC USE AS DIRECTED. 04/18/22   Mast, Man X, NP  glucose blood (ACCU-CHEK SMARTVIEW) test strip CHECK BLOOD SUGAR 3 TIMES DAILY AS DIRECTED. 04/18/22   Mast, Man X, NP     Vital Signs: BP (!) 155/65 (BP Location: Right Arm)   Pulse 97   Temp 97.9 F (36.6 C) (Oral)   Resp 14   Ht 5' 1 (1.549 m)   Wt 192 lb 14.4 oz (87.5 kg)   SpO2 99%   BMI 36.45 kg/m   Physical Exam Vitals and nursing note reviewed.  Constitutional:      General:  She is not in acute distress.    Appearance: Normal appearance. She is not ill-appearing.  HENT:     Mouth/Throat:     Mouth: Mucous membranes are moist.     Pharynx: Oropharynx is clear.  Cardiovascular:     Rate and Rhythm: Normal rate and regular rhythm.  Pulmonary:     Effort: Pulmonary effort is normal. No respiratory distress.     Breath sounds: Normal breath sounds.  Abdominal:     General: Abdomen is flat.     Palpations: Abdomen is soft.  Skin:    Comments: R femoral puncture site intact.  No oozing. Small faint bloodstain which appears old and without extension.  Soft.  Non-tender.  Neurological:      Mental Status: She is alert.     Imaging: IR Angiogram Selective Each Additional Vessel Result Date: 01/02/2024 INDICATION: Acute GI bleed in a patient with bloody stool. CTA of the abdomen demonstrates active bleeding likely secondary to diverticulosis in the sigmoid colon. EXAM: Pelvic angiogram ANESTHESIA/SEDATION: Moderate (conscious) sedation was employed during this procedure. A total of Versed  1.5 mg and Fentanyl  75 mcg was administered intravenously by the radiology nurse. Total intra-service moderate Sedation Time: 30 minutes. The patient's level of consciousness and vital signs were monitored continuously by radiology nursing throughout the procedure under my direct supervision. CONTRAST:  53 mL Omnipaque  300 FLUOROSCOPY: Radiation Exposure Index (as provided by the fluoroscopic device): 3.8 minutes mGy Kerma COMPLICATIONS: None immediate. PROCEDURE: Informed consent was obtained from the patient following explanation of the procedure, risks, benefits and alternatives. The patient understands, agrees and consents for the procedure. All questions were addressed. A time out was performed prior to the initiation of the procedure. Maximal barrier sterile technique utilized including caps, mask, sterile gowns, sterile gloves, large sterile drape, hand hygiene, and Betadine  prep. Ultrasound demonstrated the right common femoral artery to be anechoic and pulsatile indicating patency. The arteriotomy site was anesthetized with 1% lidocaine . Small incision was made at the arteriotomy site and blunt dissection was then applied. The needle was advanced from the incision to the midpoint of the artery under ultrasound guidance. Final image was obtained and stored in patient's permanent medical record. Access was exchanged over an 018 guidewire for micropuncture sheath. Access was exchanged over an 035 wire for a 6 [place] sheath. Guidewire and catheter were then advanced to the abdominal aorta. The Sim 1  catheter tip was then reformed and used to selectively cannulate the IMA based on anatomic features. Catheter was then used to perform an angiogram of the IMA. No active extravasation identified. Coaxial technique was then used to select the left colic artery and 2 separate sigmoidal arteries. In serial fashion the left colic artery was injected in order to perform a dedicated angiogram closer to the likely location of bleeding and no active extravasation identified. The microcatheter and guidewire were then used to selectively cannulate the first of 2 sigmoidal arteries. An angiogram of the first sigmoidal artery demonstrated no active bleeding. The catheter and guidewire were again used to selectively cannulate the second sigmoidal artery and a second angiogram of the sigmoidal arteries was performed also demonstrating no active extravasation. In order to demonstrate no further cross-filling, the superior rectal artery was then cannulated with the microcatheter and a separate superior rectal artery angiogram was performed also demonstrating no active bleeding. All catheters and guidewires removed from the patient. Access was closed using Angio-Seal closure device. IMPRESSION: The IMA and its branches were freely investigated  with angiograms and no active extravasation identified. No further plans for intervention. Electronically Signed   By: Cordella Banner   On: 01/02/2024 16:31   IR Angiogram Selective Each Additional Vessel Result Date: 01/02/2024 INDICATION: Acute GI bleed in a patient with bloody stool. CTA of the abdomen demonstrates active bleeding likely secondary to diverticulosis in the sigmoid colon. EXAM: Pelvic angiogram ANESTHESIA/SEDATION: Moderate (conscious) sedation was employed during this procedure. A total of Versed  1.5 mg and Fentanyl  75 mcg was administered intravenously by the radiology nurse. Total intra-service moderate Sedation Time: 30 minutes. The patient's level of consciousness  and vital signs were monitored continuously by radiology nursing throughout the procedure under my direct supervision. CONTRAST:  53 mL Omnipaque  300 FLUOROSCOPY: Radiation Exposure Index (as provided by the fluoroscopic device): 3.8 minutes mGy Kerma COMPLICATIONS: None immediate. PROCEDURE: Informed consent was obtained from the patient following explanation of the procedure, risks, benefits and alternatives. The patient understands, agrees and consents for the procedure. All questions were addressed. A time out was performed prior to the initiation of the procedure. Maximal barrier sterile technique utilized including caps, mask, sterile gowns, sterile gloves, large sterile drape, hand hygiene, and Betadine  prep. Ultrasound demonstrated the right common femoral artery to be anechoic and pulsatile indicating patency. The arteriotomy site was anesthetized with 1% lidocaine . Small incision was made at the arteriotomy site and blunt dissection was then applied. The needle was advanced from the incision to the midpoint of the artery under ultrasound guidance. Final image was obtained and stored in patient's permanent medical record. Access was exchanged over an 018 guidewire for micropuncture sheath. Access was exchanged over an 035 wire for a 6 [place] sheath. Guidewire and catheter were then advanced to the abdominal aorta. The Sim 1 catheter tip was then reformed and used to selectively cannulate the IMA based on anatomic features. Catheter was then used to perform an angiogram of the IMA. No active extravasation identified. Coaxial technique was then used to select the left colic artery and 2 separate sigmoidal arteries. In serial fashion the left colic artery was injected in order to perform a dedicated angiogram closer to the likely location of bleeding and no active extravasation identified. The microcatheter and guidewire were then used to selectively cannulate the first of 2 sigmoidal arteries. An angiogram of  the first sigmoidal artery demonstrated no active bleeding. The catheter and guidewire were again used to selectively cannulate the second sigmoidal artery and a second angiogram of the sigmoidal arteries was performed also demonstrating no active extravasation. In order to demonstrate no further cross-filling, the superior rectal artery was then cannulated with the microcatheter and a separate superior rectal artery angiogram was performed also demonstrating no active bleeding. All catheters and guidewires removed from the patient. Access was closed using Angio-Seal closure device. IMPRESSION: The IMA and its branches were freely investigated with angiograms and no active extravasation identified. No further plans for intervention. Electronically Signed   By: Cordella Banner   On: 01/02/2024 16:31   IR Angiogram Selective Each Additional Vessel Result Date: 01/02/2024 INDICATION: Acute GI bleed in a patient with bloody stool. CTA of the abdomen demonstrates active bleeding likely secondary to diverticulosis in the sigmoid colon. EXAM: Pelvic angiogram ANESTHESIA/SEDATION: Moderate (conscious) sedation was employed during this procedure. A total of Versed  1.5 mg and Fentanyl  75 mcg was administered intravenously by the radiology nurse. Total intra-service moderate Sedation Time: 30 minutes. The patient's level of consciousness and vital signs were monitored continuously by radiology nursing  throughout the procedure under my direct supervision. CONTRAST:  53 mL Omnipaque  300 FLUOROSCOPY: Radiation Exposure Index (as provided by the fluoroscopic device): 3.8 minutes mGy Kerma COMPLICATIONS: None immediate. PROCEDURE: Informed consent was obtained from the patient following explanation of the procedure, risks, benefits and alternatives. The patient understands, agrees and consents for the procedure. All questions were addressed. A time out was performed prior to the initiation of the procedure. Maximal barrier  sterile technique utilized including caps, mask, sterile gowns, sterile gloves, large sterile drape, hand hygiene, and Betadine  prep. Ultrasound demonstrated the right common femoral artery to be anechoic and pulsatile indicating patency. The arteriotomy site was anesthetized with 1% lidocaine . Small incision was made at the arteriotomy site and blunt dissection was then applied. The needle was advanced from the incision to the midpoint of the artery under ultrasound guidance. Final image was obtained and stored in patient's permanent medical record. Access was exchanged over an 018 guidewire for micropuncture sheath. Access was exchanged over an 035 wire for a 6 [place] sheath. Guidewire and catheter were then advanced to the abdominal aorta. The Sim 1 catheter tip was then reformed and used to selectively cannulate the IMA based on anatomic features. Catheter was then used to perform an angiogram of the IMA. No active extravasation identified. Coaxial technique was then used to select the left colic artery and 2 separate sigmoidal arteries. In serial fashion the left colic artery was injected in order to perform a dedicated angiogram closer to the likely location of bleeding and no active extravasation identified. The microcatheter and guidewire were then used to selectively cannulate the first of 2 sigmoidal arteries. An angiogram of the first sigmoidal artery demonstrated no active bleeding. The catheter and guidewire were again used to selectively cannulate the second sigmoidal artery and a second angiogram of the sigmoidal arteries was performed also demonstrating no active extravasation. In order to demonstrate no further cross-filling, the superior rectal artery was then cannulated with the microcatheter and a separate superior rectal artery angiogram was performed also demonstrating no active bleeding. All catheters and guidewires removed from the patient. Access was closed using Angio-Seal closure device.  IMPRESSION: The IMA and its branches were freely investigated with angiograms and no active extravasation identified. No further plans for intervention. Electronically Signed   By: Cordella Banner   On: 01/02/2024 16:31   IR Angiogram Visceral Selective Result Date: 01/02/2024 INDICATION: Acute GI bleed in a patient with bloody stool. CTA of the abdomen demonstrates active bleeding likely secondary to diverticulosis in the sigmoid colon. EXAM: Pelvic angiogram ANESTHESIA/SEDATION: Moderate (conscious) sedation was employed during this procedure. A total of Versed  1.5 mg and Fentanyl  75 mcg was administered intravenously by the radiology nurse. Total intra-service moderate Sedation Time: 30 minutes. The patient's level of consciousness and vital signs were monitored continuously by radiology nursing throughout the procedure under my direct supervision. CONTRAST:  53 mL Omnipaque  300 FLUOROSCOPY: Radiation Exposure Index (as provided by the fluoroscopic device): 3.8 minutes mGy Kerma COMPLICATIONS: None immediate. PROCEDURE: Informed consent was obtained from the patient following explanation of the procedure, risks, benefits and alternatives. The patient understands, agrees and consents for the procedure. All questions were addressed. A time out was performed prior to the initiation of the procedure. Maximal barrier sterile technique utilized including caps, mask, sterile gowns, sterile gloves, large sterile drape, hand hygiene, and Betadine  prep. Ultrasound demonstrated the right common femoral artery to be anechoic and pulsatile indicating patency. The arteriotomy site was anesthetized with 1%  lidocaine . Small incision was made at the arteriotomy site and blunt dissection was then applied. The needle was advanced from the incision to the midpoint of the artery under ultrasound guidance. Final image was obtained and stored in patient's permanent medical record. Access was exchanged over an 018 guidewire for  micropuncture sheath. Access was exchanged over an 035 wire for a 6 [place] sheath. Guidewire and catheter were then advanced to the abdominal aorta. The Sim 1 catheter tip was then reformed and used to selectively cannulate the IMA based on anatomic features. Catheter was then used to perform an angiogram of the IMA. No active extravasation identified. Coaxial technique was then used to select the left colic artery and 2 separate sigmoidal arteries. In serial fashion the left colic artery was injected in order to perform a dedicated angiogram closer to the likely location of bleeding and no active extravasation identified. The microcatheter and guidewire were then used to selectively cannulate the first of 2 sigmoidal arteries. An angiogram of the first sigmoidal artery demonstrated no active bleeding. The catheter and guidewire were again used to selectively cannulate the second sigmoidal artery and a second angiogram of the sigmoidal arteries was performed also demonstrating no active extravasation. In order to demonstrate no further cross-filling, the superior rectal artery was then cannulated with the microcatheter and a separate superior rectal artery angiogram was performed also demonstrating no active bleeding. All catheters and guidewires removed from the patient. Access was closed using Angio-Seal closure device. IMPRESSION: The IMA and its branches were freely investigated with angiograms and no active extravasation identified. No further plans for intervention. Electronically Signed   By: Cordella Banner   On: 01/02/2024 16:31   CT ANGIO GI BLEED Result Date: 01/02/2024 CLINICAL DATA:  Acute Lower GI bleed EXAM: CTA ABDOMEN AND PELVIS WITHOUT AND WITH CONTRAST TECHNIQUE: Initially, a noncontrast CT of the abdomen and pelvis was performed. Subsequently, multidetector CT imaging of the abdomen and pelvis was performed using the standard protocol during bolus administration of intravenous contrast.  Multiplanar reconstructed images and MIPs were obtained and reviewed to evaluate the vascular anatomy. RADIATION DOSE REDUCTION: This exam was performed according to the departmental dose-optimization program which includes automated exposure control, adjustment of the mA and/or kV according to patient size and/or use of iterative reconstruction technique. CONTRAST:  OMNIPAQUE  IOHEXOL  350 MG/ML SOLN COMPARISON:  None Available. FINDINGS: VASCULAR Aorta: No aortic aneurysm or dissection. Calcified and noncalcified plaque scattered throughout the aorta. No hemodynamically significant stenosis. Celiac: Patent without acute thrombus, aneurysm, or dissection.No hemodynamically significant stenosis. SMA: Patent without acute thrombus, aneurysm, or dissection.Mild-to-moderate narrowing of the proximal SMA from noncalcified plaque. Renals: Patent without acute thrombus, aneurysm, or dissection.Severe narrowing of the right renal artery ostium from mixed density plaque. IMA: Patent without acute thrombus, aneurysm, or dissection.No hemodynamically significant stenosis. Inflow: Patent without acute thrombus, aneurysm, or dissection.No hemodynamically significant stenosis. Proximal Outflow: The bilateral common femoral and visualized portions of the superficial and profunda femoral arteries are patent without acute thrombus, aneurysm, or dissection.No hemodynamically significant stenosis. Veins: No obvious venous abnormality within the limitations of this arterial phase study. Review of the MIP images confirms the above findings. NON-VASCULAR Lower chest: No focal airspace consolidation or pleural effusion. Hepatobiliary: The most superior aspects of the right and left hepatic lobes are excluded from field of view. No mass.Diffuse hepatic steatosis.No radiopaque stones or wall thickening of the gallbladder.No intrahepatic or extrahepatic biliary ductal dilation. Pancreas: No mass or main ductal dilation.No peripancreatic  inflammation or fluid collection. Spleen: Normal size. No mass. Adrenals/Urinary Tract: Nodular thickening within both adrenal glands, more so on the left, without discrete mass or dominant nodule. Subcentimeter hypodensities are noted in the kidneys, too small to definitively characterize, but likely small cysts. No hydronephrosis or nephrolithiasis. The urinary bladder is distended without focal abnormality. Stomach/Bowel:Small hiatal hernia. The stomach is decompressed without focal abnormality. No small bowel wall thickening or inflammation. No small bowel obstruction.Normal appendix. Descending and sigmoid colonic diverticulosis. No changes of acute diverticulitis. GI Bleed: Active diverticular bleeding from the distal sigmoid colon. Lymphatic: No intraabdominal or pelvic lymphadenopathy. Reproductive: Age-related atrophy of the uterus and ovaries. No concerning adnexal mass.No free pelvic fluid. Other: No pneumoperitoneum, ascites, or mesenteric inflammation. Musculoskeletal: No acute fracture or destructive lesion.Diffuse osteopenia. Multilevel degenerative disc disease of the spine. IMPRESSION: VASCULAR No aortic aneurysm or aortic dissection. NON-VASCULAR 1. Active diverticular bleeding from the distal sigmoid colon. GI and surgical consultation recommended. 2. Descending and sigmoid colonic diverticulosis. No changes of acute diverticulitis. 3. Diffuse hepatic steatosis. Critical Value/emergent results were called by telephone at the time of interpretation on 01/02/2024 at 11:03 am to provider Soyla Body, MD, who verbally acknowledged these results. Electronically Signed   By: Rogelia Myers M.D.   On: 01/02/2024 11:07    Labs:  CBC: Recent Labs    01/02/24 1104 01/02/24 1857 01/03/24 0506 01/03/24 1328 01/03/24 1728 01/04/24 0325  WBC 14.1*  --  10.3 11.5*  --  10.3  HGB 10.5*   < > 8.9* 8.3* 7.7* 7.9*  HCT 32.8*   < > 27.7* 25.8* 24.0* 24.6*  PLT 255  --  235 251  --  231   < > =  values in this interval not displayed.    COAGS: Recent Labs    01/02/24 0726  INR 1.0  APTT 28    BMP: Recent Labs    11/06/23 0948 01/02/24 0726 01/03/24 0506 01/04/24 0325  NA 139 139 138 140  K 4.6 4.5 3.7 3.9  CL 104 105 108 110  CO2 22 23 20* 22  GLUCOSE 140* 142* 113* 129*  BUN 25* 26* 33* 31*  CALCIUM 9.2 9.0 8.4* 8.2*  CREATININE 1.27* 1.14* 1.31* 1.31*  GFRNONAA 41* 47* 40* 40*    LIVER FUNCTION TESTS: Recent Labs    03/29/23 1105 01/02/24 0726 01/04/24 0325  BILITOT 0.4 0.7  --   AST 21 20  --   ALT 22 15  --   ALKPHOS 85 108  --   PROT 6.6 6.3*  --   ALBUMIN 4.3 3.2* 2.9*    Assessment and Plan: GI bleed s/p mesenteric angiogram without intervention.  Patient now s/p angiogram with no bleeding identified at time of study.  No embolization performed.  Unfortunately, her Hgb dropped again this AM from 10 to 8.8.  She had no bloody bowel movements overnight, then 2 this AM/afternoon.  VSS.  Planning for colonoscopy tomorrow with GI.   IR remains available.   Please reconsult as needed.   Would keep R femoral dressing in place through colonoscopy prep then may remove as desired.   Electronically Signed: Axiel Fjeld Sue-Ellen Marwa Fuhrman, PA 01/04/2024, 8:15 AM   I spent a total of 15 Minutes at the the patient's bedside AND on the patient's hospital floor or unit, greater than 50% of which was counseling/coordinating care for GI bleed.

## 2024-01-04 NOTE — Progress Notes (Signed)
 PROGRESS NOTE  EVOLETH NORDMEYER FMW:[id number] DOB: 06-15-1940   PCP: Mast, Man X, NP  Patient is from: Home.  DOA: 01/02/2024 LOS: 2  Chief complaints Chief Complaint  Patient presents with   Rectal Bleeding     Brief Narrative / Interim history: 83 year old F with PMH of CAD on DAPT, CKD-3A, gastric ulcer, hemorrhoids, HTN, HLD, GERD and obesity presenting with acute BRBPR since 5 PM on the day of presentation, and admitted with lower GI bleed.  In ED, stable vitals.  Hgb 11.6 (baseline 12-13).  WBC 14.1.  INR 1.0.  Hemoccult positive.  CT angio  positive for acute diverticular bleeding from the distal sigmoid colon.  GI consulted and recommended IR embolization.  Patient was taken for IR embolization on 10/14 but no extravasation on IMA angiogram. Hemoglobin dropped further to 7.9.  Colonoscopy on 10/16 with severe diverticulosis in the sigmoid colon and in the descending colon, narrowing of the colon in association with the diverticular opening, evidence of diverticular spasm, evidence of an impacted diverticulum, non-bleeding external and internal hemorrhoids.  GI recommended holding Plavix  until 10/19 and signed off.  Subjective: Seen and examined earlier this afternoon after she returned from colonoscopy.  Resting in bed.  No complaints.  Denies chest pain, shortness of breath, nausea or vomiting.  No further GI bleed.  Objective: Vitals:   01/04/24 1200 01/04/24 1210 01/04/24 1222 01/04/24 1327  BP: (!) 132/57 (!) 148/53 118/60 (!) 152/56  Pulse: 86 85 82   Resp: 20 17 16    Temp:   97.6 F (36.4 C)   TempSrc:   Oral   SpO2: 98% 94% 98%   Weight:      Height:        Examination:  GENERAL: No apparent distress.  Nontoxic. HEENT: MMM.  Vision and hearing grossly intact.  NECK: Supple.  No apparent JVD.  RESP:  No IWOB.  Fair aeration bilaterally. CVS:  RRR. Heart sounds normal.  ABD/GI/GU: BS+. Abd soft, NTND.  MSK/EXT:  Moves extremities. No apparent deformity. No  edema.  SKIN: no apparent skin lesion or wound NEURO: AA.  Oriented appropriately.  No apparent focal neuro deficit. PSYCH: Calm. Normal affect.   Consultants:  Gastroenterology Interventional radiology  Procedures: 10/14-IMA angiogram without extravasation. 10/16-colonoscopy with severe diverticulosis in the sigmoid colon and in the descending colon, narrowing of the colon in association with the diverticular opening, evidence of diverticular spasm, evidence of an impacted diverticulum, non-bleeding external and internal hemorrhoids.   Microbiology summarized: MRSA PCR screen nonreactive.  Assessment and plan: ABLA due to acute diverticular bleeding: Patient is on Plavix  and aspirin  for CAD.  Hgb slowly drifting down.  CT angio with diverticular bleed from distal sigmoid colon.  IMA angiogram negative.  Colonoscopy as above.  Bleeding seems to have subsided.  Anemia panel with some iron deficiency. Recent Labs    03/29/23 1105 08/28/23 1622 11/06/23 0948 01/02/24 0726 01/02/24 1104 01/02/24 1857 01/03/24 0506 01/03/24 1328 01/03/24 1728 01/04/24 0325  HGB 12.8 12.6 12.4 11.6* 10.5* 10.0* 8.9* 8.3* 7.7* 7.9*  - GI recommended holding Plavix  until 10/19 - IV Venofer 300 mg x 1 - Monitor H&H.  Will transfuse if Hgb drops further.  History of CAD: On Plavix  and aspirin .  No anginal symptoms.  LHC on 6/12 with noncritical single-vessel CAD with 70 to 80% stenosis of mid RCA.  At that time, the recommendation was aspirin  81 mg daily for moderate CAD.  Now with GI bleed -Continue holding Plavix   and aspirin  -GI recommended holding Plavix  until 10/19.  NIDDM-2 with hyperglycemia: A1c 6.6% on 7/10. Recent Labs  Lab 01/03/24 1638 01/03/24 2100 01/04/24 0627 01/04/24 0926 01/04/24 1241  GLUCAP 93 152* 123* 128* 97  -Continue SSI-sensitive  CKD-3A: Relatively stable -Continue monitoring  GERD/history of gastric ulcer -Continue PPI   Essential hypertension: BP within  acceptable range. -Continue amlodipine   Mood disorder: Stable - Continue home meds  Mild leukocytosis: Likely demargination.  Resolved.   Class II obesity Body mass index is 36.45 kg/m.          DVT prophylaxis:  SCDs Start: 01/02/24 1237  Code Status: DNR Family Communication: Updated patient's daughter earlier this morning while patient was in Endo. Level of care: Progressive Status is: Inpatient Remains inpatient appropriate because: ABLA/rectal bleed   Final disposition: Home   55 minutes with more than 50% spent in reviewing records, counseling patient/family and coordinating care.   Sch Meds:  Scheduled Meds:  acetaminophen   500 mg Oral q morning   acetaminophen   650 mg Oral QHS   amLODipine   2.5 mg Oral Daily   cholecalciferol  5,000 Units Oral Daily   insulin  aspart  0-9 Units Subcutaneous TID WC   pantoprazole (PROTONIX) IV  40 mg Intravenous Q12H   Continuous Infusions:   PRN Meds:.ALPRAZolam , hydrALAZINE , ondansetron  **OR** ondansetron  (ZOFRAN ) IV, polyethylene glycol  Antimicrobials: Anti-infectives (From admission, onward)    None        I have personally reviewed the following labs and images: CBC: Recent Labs  Lab 01/02/24 0726 01/02/24 1104 01/02/24 1857 01/03/24 0506 01/03/24 1328 01/03/24 1728 01/04/24 0325  WBC 10.4 14.1*  --  10.3 11.5*  --  10.3  NEUTROABS 7.9*  --   --   --   --   --   --   HGB 11.6* 10.5* 10.0* 8.9* 8.3* 7.7* 7.9*  HCT 35.9* 32.8* 30.6* 27.7* 25.8* 24.0* 24.6*  MCV 90.0 90.9  --  90.8 90.8  --  91.4  PLT 288 255  --  235 251  --  231   BMP &GFR Recent Labs  Lab 01/02/24 0726 01/03/24 0506 01/04/24 0325  NA 139 138 140  K 4.5 3.7 3.9  CL 105 108 110  CO2 23 20* 22  GLUCOSE 142* 113* 129*  BUN 26* 33* 31*  CREATININE 1.14* 1.31* 1.31*  CALCIUM 9.0 8.4* 8.2*  MG  --   --  1.9  PHOS  --   --  3.5   Estimated Creatinine Clearance: 31.6 mL/min (A) (by C-G formula based on SCr of 1.31 mg/dL  (H)). Liver & Pancreas: Recent Labs  Lab 01/02/24 0726 01/04/24 0325  AST 20  --   ALT 15  --   ALKPHOS 108  --   BILITOT 0.7  --   PROT 6.3*  --   ALBUMIN 3.2* 2.9*   No results for input(s): LIPASE, AMYLASE in the last 168 hours. No results for input(s): AMMONIA in the last 168 hours. Diabetic: No results for input(s): HGBA1C in the last 72 hours. Recent Labs  Lab 01/03/24 1638 01/03/24 2100 01/04/24 0627 01/04/24 0926 01/04/24 1241  GLUCAP 93 152* 123* 128* 97   Cardiac Enzymes: No results for input(s): CKTOTAL, CKMB, CKMBINDEX, TROPONINI in the last 168 hours. No results for input(s): PROBNP in the last 8760 hours. Coagulation Profile: Recent Labs  Lab 01/02/24 0726  INR 1.0   Thyroid  Function Tests: No results for input(s): TSH, T4TOTAL, FREET4, T3FREE, THYROIDAB in  the last 72 hours. Lipid Profile: No results for input(s): CHOL, HDL, LDLCALC, TRIG, CHOLHDL, LDLDIRECT in the last 72 hours. Anemia Panel: Recent Labs    01/03/24 0958  VITAMINB12 507  FOLATE 17.4  FERRITIN 23  TIBC 332  IRON 76  RETICCTPCT 1.9   Urine analysis:    Component Value Date/Time   BILIRUBINUR n 02/22/2016 1353   PROTEINUR n 02/22/2016 1353   UROBILINOGEN 0.2 02/22/2016 1353   NITRITE n 02/22/2016 1353   LEUKOCYTESUR Trace (A) 02/22/2016 1353   Sepsis Labs: Invalid input(s): PROCALCITONIN, LACTICIDVEN  Microbiology: Recent Results (from the past 240 hours)  MRSA Next Gen by PCR, Nasal     Status: None   Collection Time: 01/02/24  4:46 PM   Specimen: Nasal Mucosa; Nasal Swab  Result Value Ref Range Status   MRSA by PCR Next Gen NOT DETECTED NOT DETECTED Final    Comment: (NOTE) The GeneXpert MRSA Assay (FDA approved for NASAL specimens only), is one component of a comprehensive MRSA colonization surveillance program. It is not intended to diagnose MRSA infection nor to guide or monitor treatment for MRSA infections. Test  performance is not FDA approved in patients less than 9 years old. Performed at [facility], [address] [place], [place] [address]     Radiology Studies: No results found.     Roxas Clymer T. Nyela Cortinas [facility]  If 7PM-7AM, please contact night-coverage [email or web address] 01/04/2024, 2:05 PM

## 2024-01-04 NOTE — Progress Notes (Signed)
 PT Cancellation Note  Patient Details Name: FATIMA FEDIE MRN: [id number] DOB: 03/31/1940   Cancelled Treatment:    Reason Eval/Treat Not Completed: Patient at procedure or test/unavailable; will follow up when pt available and able to participate.    Montie Portal 01/04/2024, 9:26 AM Micheline Portal, PT Acute Rehabilitation Services [phone] 01/04/2024

## 2024-01-04 NOTE — Anesthesia Procedure Notes (Signed)
 Procedure Name: MAC Date/Time: 01/04/2024 11:24 AM  Performed by: Arvell Edsel HERO, CRNAPre-anesthesia Checklist: Patient identified, Emergency Drugs available, Suction available, Patient being monitored and Timeout performed Patient Re-evaluated:Patient Re-evaluated prior to induction Oxygen Delivery Method: Simple face mask

## 2024-01-04 NOTE — Op Note (Signed)
 [facility] Patient Name: Brittany Mcclure Procedure Date : 01/04/2024 MRN: [id number] Attending MD: Gustav ALONSO Mcgee , MD, [id number] Date of Birth: [birth year] CSN: [id number] Age: 83 Admit Type: Inpatient Procedure:                Colonoscopy Indications:              Evaluation of GI bleeding presenting with                            Hematochezia, therapeutic management of                            diverticular hemorrhage Providers:                Gustav ALONSO Mcgee, MD, Gregoria Pierce, RN,                            Coye Bade, Technician Referring MD:              Medicines:                Monitored Anesthesia Care Complications:            No immediate complications. Estimated Blood Loss:     Estimated blood loss: none. Procedure:                Pre-Anesthesia Assessment:                           - Prior to the procedure, a History and Physical                            was performed, and patient medications and                            allergies were reviewed. The patient's tolerance of                            previous anesthesia was also reviewed. The risks                            and benefits of the procedure and the sedation                            options and risks were discussed with the patient.                            All questions were answered, and informed consent                            was obtained. Prior Anticoagulants: The patient                            last took Plavix  (clopidogrel ) 3 days prior to the                            procedure. ASA Grade  Assessment: III - A patient                            with severe systemic disease. After reviewing the                            risks and benefits, the patient was deemed in                            satisfactory condition to undergo the procedure.                           After obtaining informed consent, the colonoscope                            was passed  under direct vision. Throughout the                            procedure, the patient's blood pressure, pulse, and                            oxygen saturations were monitored continuously. The                            [id number]-H190TL ([id number]) Olympus colonoscope was                            introduced through the anus and advanced to the the                            cecum, identified by appendiceal orifice and                            ileocecal valve. The colonoscopy was performed                            without difficulty. The patient tolerated the                            procedure well. The quality of the bowel                            preparation was good. The ileocecal valve,                            appendiceal orifice, and rectum were photographed. Scope In: 11:29:31 AM Scope Out: 11:45:33 AM Scope Withdrawal Time: 0 hours 7 minutes 48 seconds  Total Procedure Duration: 0 hours 16 minutes 2 seconds  Findings:      The perianal and digital rectal examinations were normal.      Multiple large-mouthed, medium-mouthed and small-mouthed diverticula       were found in the sigmoid colon and descending colon. There was       narrowing of the colon in association with the diverticular opening.  There was evidence of diverticular spasm. There was evidence of an       impacted diverticulum. No active bleeding. Unable to identify exact       source of diverticular hemorrhage      Non-bleeding external and internal hemorrhoids were found during       retroflexion. The hemorrhoids were small.      The exam was otherwise without abnormality. Impression:               - Severe diverticulosis in the sigmoid colon and in                            the descending colon. There was narrowing of the                            colon in association with the diverticular opening.                            There was evidence of diverticular spasm. There was                             evidence of an impacted diverticulum.                           - Non-bleeding external and internal hemorrhoids.                           - The examination was otherwise normal.                           - No specimens collected. Recommendation:           - Resume previous diet.                           - Continue present medications.                           - No repeat colonoscopy due to age.                           - OK to DC home if no further bleeding                           - Restart Plavix  in 3 days 01/07/24 if no further                            bleeding                           - GI signing off, please call with questions Procedure Code(s):        --- Professional ---                           [phone], Colonoscopy, flexible; diagnostic, including  collection of specimen(s) by brushing or washing,                            when performed (separate procedure) Diagnosis Code(s):        --- Professional ---                           K64.8, Other hemorrhoids                           K92.1, Melena (includes Hematochezia)                           K57.30, Diverticulosis of large intestine without                            perforation or abscess without bleeding CPT copyright 2022 American Medical Association. All rights reserved. The codes documented in this report are preliminary and upon coder review may  be revised to meet current compliance requirements. Amira Podolak V. Dylyn Mclaren, MD 01/04/2024 11:56:55 AM This report has been signed electronically. Number of Addenda: 0

## 2024-01-04 NOTE — Plan of Care (Signed)

## 2024-01-04 NOTE — Transfer of Care (Signed)
 Immediate Anesthesia Transfer of Care Note  Patient: Brittany Mcclure  Procedure(s) Performed: COLONOSCOPY  Patient Location: Endoscopy Unit  Anesthesia Type:MAC  Level of Consciousness: drowsy and patient cooperative  Airway & Oxygen Therapy: Patient Spontanous Breathing and Patient connected to face mask oxygen  Post-op Assessment: Report given to RN, Post -op Vital signs reviewed and stable, Patient moving all extremities, and Patient moving all extremities X 4  Post vital signs: Reviewed and stable  Last Vitals:  Vitals Value Taken Time  BP 86/64 01/04/24 11:52  Temp    Pulse 91 01/04/24 11:54  Resp 22 01/04/24 11:53  SpO2 100 % 01/04/24 11:54  Vitals shown include unfiled device data.  Last Pain:  Vitals:   01/04/24 0918  TempSrc: Temporal  PainSc: 0-No pain      Patients Stated Pain Goal: 0 (01/03/24 0810)  Complications: No notable events documented.

## 2024-01-05 DIAGNOSIS — D62 Acute posthemorrhagic anemia: Secondary | ICD-10-CM | POA: Diagnosis not present

## 2024-01-05 DIAGNOSIS — K5791 Diverticulosis of intestine, part unspecified, without perforation or abscess with bleeding: Secondary | ICD-10-CM | POA: Diagnosis not present

## 2024-01-05 DIAGNOSIS — I2510 Atherosclerotic heart disease of native coronary artery without angina pectoris: Secondary | ICD-10-CM | POA: Diagnosis not present

## 2024-01-05 LAB — CBC
HCT: 21.4 % — ABNORMAL LOW (ref 36.0–46.0)
Hemoglobin: 7 g/dL — ABNORMAL LOW (ref 12.0–15.0)
MCH: 29.8 pg (ref 26.0–34.0)
MCHC: 32.7 g/dL (ref 30.0–36.0)
MCV: 91.1 fL (ref 80.0–100.0)
Platelets: 202 K/uL (ref 150–400)
RBC: 2.35 MIL/uL — ABNORMAL LOW (ref 3.87–5.11)
RDW: 14.6 % (ref 11.5–15.5)
WBC: 9.1 K/uL (ref 4.0–10.5)
nRBC: 0 % (ref 0.0–0.2)

## 2024-01-05 LAB — CBC WITH DIFFERENTIAL/PLATELET
Abs Immature Granulocytes: 0.18 K/uL — ABNORMAL HIGH (ref 0.00–0.07)
Basophils Absolute: 0.1 K/uL (ref 0.0–0.1)
Basophils Relative: 1 %
Eosinophils Absolute: 0.5 K/uL (ref 0.0–0.5)
Eosinophils Relative: 5 %
HCT: 24.7 % — ABNORMAL LOW (ref 36.0–46.0)
Hemoglobin: 8.2 g/dL — ABNORMAL LOW (ref 12.0–15.0)
Immature Granulocytes: 2 %
Lymphocytes Relative: 18 %
Lymphs Abs: 1.9 K/uL (ref 0.7–4.0)
MCH: 30 pg (ref 26.0–34.0)
MCHC: 33.2 g/dL (ref 30.0–36.0)
MCV: 90.5 fL (ref 80.0–100.0)
Monocytes Absolute: 0.8 K/uL (ref 0.1–1.0)
Monocytes Relative: 8 %
Neutro Abs: 6.8 K/uL (ref 1.7–7.7)
Neutrophils Relative %: 66 %
Platelets: 214 K/uL (ref 150–400)
RBC: 2.73 MIL/uL — ABNORMAL LOW (ref 3.87–5.11)
RDW: 14.8 % (ref 11.5–15.5)
WBC: 10.2 K/uL (ref 4.0–10.5)
nRBC: 0 % (ref 0.0–0.2)

## 2024-01-05 LAB — MAGNESIUM: Magnesium: 1.9 mg/dL (ref 1.7–2.4)

## 2024-01-05 LAB — RENAL FUNCTION PANEL
Albumin: 2.6 g/dL — ABNORMAL LOW (ref 3.5–5.0)
Anion gap: 10 (ref 5–15)
BUN: 19 mg/dL (ref 8–23)
CO2: 22 mmol/L (ref 22–32)
Calcium: 8.2 mg/dL — ABNORMAL LOW (ref 8.9–10.3)
Chloride: 109 mmol/L (ref 98–111)
Creatinine, Ser: 1.27 mg/dL — ABNORMAL HIGH (ref 0.44–1.00)
GFR, Estimated: 41 mL/min — ABNORMAL LOW (ref >60)
Glucose, Bld: 114 mg/dL — ABNORMAL HIGH (ref 70–99)
Phosphorus: 2.9 mg/dL (ref 2.5–4.6)
Potassium: 3.5 mmol/L (ref 3.5–5.1)
Sodium: 141 mmol/L (ref 135–145)

## 2024-01-05 LAB — GLUCOSE, CAPILLARY
Glucose-Capillary: 119 mg/dL — ABNORMAL HIGH (ref 70–99)
Glucose-Capillary: 114 mg/dL — ABNORMAL HIGH (ref 70–99)
Glucose-Capillary: 135 mg/dL — ABNORMAL HIGH (ref 70–99)
Glucose-Capillary: 127 mg/dL — ABNORMAL HIGH (ref 70–99)

## 2024-01-05 LAB — PREPARE RBC (CROSSMATCH)

## 2024-01-05 MED ORDER — SODIUM CHLORIDE 0.9% IV SOLUTION: Freq: Once | INTRAVENOUS | Status: AC

## 2024-01-05 MED ORDER — AMLODIPINE BESYLATE 5 MG PO TABS
5.0000 mg | ORAL_TABLET | Freq: Every day | ORAL | Status: DC
  Administered 2024-01-05: 5 mg via ORAL
  Filled 2024-01-05: qty 1

## 2024-01-05 NOTE — Plan of Care (Signed)

## 2024-01-05 NOTE — TOC CM/SW Note (Addendum)
 Left message with Hospital doctor at [facility] regarding home health PT. Await call back   Amber returned call. They will need HHPT orders and face to face . NCM entered and asked MD to sign. Also will need DC summary to state home health PT .   Amber has assess to [employer] chatted MD

## 2024-01-05 NOTE — Care Management Important Message (Signed)
 Important Message  Patient Details  Name: Brittany Mcclure MRN: [id number] Date of Birth: [birth year]   Important Message Given:  Yes - Medicare IM     Claretta Deed 01/05/2024, 3:38 PM

## 2024-01-05 NOTE — Plan of Care (Signed)
  Problem: Coping: Goal: Ability to adjust to condition or change in health will improve Outcome: Progressing   Problem: Health Behavior/Discharge Planning: Goal: Ability to manage health-related needs will improve Outcome: Progressing   Problem: Clinical Measurements: Goal: Ability to maintain clinical measurements within normal limits will improve Outcome: Progressing   Problem: Activity: Goal: Risk for activity intolerance will decrease Outcome: Progressing

## 2024-01-05 NOTE — Progress Notes (Signed)
 PROGRESS NOTE  Brittany Mcclure FMW:[id number] DOB: May 17, 1940   PCP: Mast, Man X, NP  [facility] is [facility]: [facility].  DOA: 01/02/2024 LOS: 3  Chief complaints Chief Complaint  Patient presents with   Rectal Bleeding     Brief Narrative / Interim history: 83 year old F with PMH of CAD on DAPT, CKD-3A, gastric ulcer, hemorrhoids, HTN, HLD, GERD and obesity presenting with acute BRBPR since 5 PM on the day of presentation, and admitted with lower GI bleed.  In ED, stable vitals.  Hgb 11.6 (baseline 12-13).  WBC 14.1.  INR 1.0.  Hemoccult positive.  CT angio  positive for acute diverticular bleeding from the distal sigmoid colon.  GI consulted and recommended IR embolization.  Patient was taken for IR embolization on 10/14 but no extravasation on IMA angiogram. Hemoglobin dropped further to 7.9.  Colonoscopy on 10/16 with severe diverticulosis in the sigmoid colon and in the descending colon, narrowing of the colon in association with the diverticular opening, evidence of diverticular spasm, evidence of an impacted diverticulum, non-bleeding external and internal hemorrhoids.  GI recommended holding Plavix  until 10/19 and signed off.  Hemoglobin dropped further to 7.0 without overt bleeding.  One unit of blood ordered.  Subjective: Seen and examined earlier this morning.  No major events overnight of this morning.  No complaints.  She walked in the hallway.  She feels a little off balance but she says that is chronic for her.  Denies chest pain, shortness of breath, palpitation or lightheadedness.  Has not had bowel movement since colonoscopy.  Objective: Vitals:   01/05/24 1010 01/05/24 1034 01/05/24 1054 01/05/24 1132  BP: (!) 150/43 (!) 151/44 (!) 147/73 (!) 148/55  Pulse: 91 85  83  Resp: 19 18  17   Temp: 98 F (36.7 C) 98.5 F (36.9 C)  98.7 F (37.1 C)  TempSrc: Oral Oral  Oral  SpO2: 98% 95%  99%  Weight:      Height:        Examination:  GENERAL: No apparent distress.   Nontoxic.  Sitting on bedside chair. HEENT: MMM.  Vision and hearing grossly intact.  NECK: Supple.  No apparent JVD.  RESP:  No IWOB.  Fair aeration bilaterally. CVS:  RRR. Heart sounds normal.  ABD/GI/GU: BS+. Abd soft, NTND.  MSK/EXT:  Moves extremities. No apparent deformity. No edema.  SKIN: no apparent skin lesion or wound NEURO: AA.  Oriented appropriately.  No apparent focal neuro deficit. PSYCH: Calm. Normal affect.   Consultants:  Gastroenterology Interventional radiology  Procedures: 10/14-IMA angiogram without extravasation. 10/16-colonoscopy with severe diverticulosis in the sigmoid colon and in the descending colon, narrowing of the colon in association with the diverticular opening, evidence of diverticular spasm, evidence of an impacted diverticulum, non-bleeding external and internal hemorrhoids.   Microbiology summarized: MRSA PCR screen nonreactive.  Assessment and plan: ABLA due to acute diverticular bleeding: Patient is on Plavix  and aspirin  for CAD.  Hgb slowly drifting down.  CT angio with diverticular bleed from distal sigmoid colon.  IMA angiogram negative.  Colonoscopy as above.  Bleeding seems to have subsided but Hgb dropped to 7.0.  Anemia panel with some iron deficiency. Recent Labs    08/28/23 1622 11/06/23 0948 01/02/24 0726 01/02/24 1104 01/02/24 1857 01/03/24 0506 01/03/24 1328 01/03/24 1728 01/04/24 0325 01/05/24 0342  HGB 12.6 12.4 11.6* 10.5* 10.0* 8.9* 8.3* 7.7* 7.9* 7.0*  -Transfuse 1 unit.  Goal Hgb > 8.0 given CAD. - GI recommended holding Plavix  until 10/19 - IV  Venofer 300 mg on 10/16 - Recheck posttransfusion CBC - Continue soft diet.  History of CAD: On Plavix  and aspirin .  No anginal symptoms.  LHC on 6/12 with noncritical single-vessel CAD with 70 to 80% stenosis of mid RCA.  At that time, the recommendation was aspirin  81 mg daily for moderate CAD.  Now with GI bleed -Continue holding Plavix  and aspirin  -GI recommended  holding Plavix  until 10/19.  NIDDM-2 with hyperglycemia: A1c 6.6% on 7/10. Recent Labs  Lab 01/04/24 1241 01/04/24 1633 01/04/24 2104 01/05/24 0617 01/05/24 1136  GLUCAP 97 116* 126* 119* 114*  -Continue SSI-sensitive  CKD-3A: Relatively stable -Continue monitoring  GERD/history of gastric ulcer -Continue PPI   Essential hypertension: SBP elevated to 160s. - Increase amlodipine  to 5 mg daily  Mood disorder: Stable - Continue home meds  Mild leukocytosis: Likely demargination.  Resolved.   Class II obesity Body mass index is 37.74 kg/m.          DVT prophylaxis:  SCDs Start: 01/02/24 1237  Code Status: DNR Family Communication: None at bedside today. Level of care: Progressive Status is: Inpatient Remains inpatient appropriate because: ABLA/rectal bleed   Final disposition: Home   55 minutes with more than 50% spent in reviewing records, counseling patient/family and coordinating care.   Sch Meds:  Scheduled Meds:  acetaminophen   500 mg Oral q morning   acetaminophen   650 mg Oral QHS   amLODipine   5 mg Oral Daily   cholecalciferol  5,000 Units Oral Daily   insulin  aspart  0-9 Units Subcutaneous TID WC   pantoprazole (PROTONIX) IV  40 mg Intravenous Q12H   Continuous Infusions:   PRN Meds:.ALPRAZolam , hydrALAZINE , ondansetron  **OR** ondansetron  (ZOFRAN ) IV, polyethylene glycol  Antimicrobials: Anti-infectives (From admission, onward)    None        I have personally reviewed the following labs and images: CBC: Recent Labs  Lab 01/02/24 0726 01/02/24 1104 01/02/24 1857 01/03/24 0506 01/03/24 1328 01/03/24 1728 01/04/24 0325 01/05/24 0342  WBC 10.4 14.1*  --  10.3 11.5*  --  10.3 9.1  NEUTROABS 7.9*  --   --   --   --   --   --   --   HGB 11.6* 10.5*   < > 8.9* 8.3* 7.7* 7.9* 7.0*  HCT 35.9* 32.8*   < > 27.7* 25.8* 24.0* 24.6* 21.4*  MCV 90.0 90.9  --  90.8 90.8  --  91.4 91.1  PLT 288 255  --  235 251  --  231 202   < > = values  in this interval not displayed.   BMP &GFR Recent Labs  Lab 01/02/24 0726 01/03/24 0506 01/04/24 0325 01/05/24 0342  NA 139 138 140 141  K 4.5 3.7 3.9 3.5  CL 105 108 110 109  CO2 23 20* 22 22  GLUCOSE 142* 113* 129* 114*  BUN 26* 33* 31* 19  CREATININE 1.14* 1.31* 1.31* 1.27*  CALCIUM 9.0 8.4* 8.2* 8.2*  MG  --   --  1.9 1.9  PHOS  --   --  3.5 2.9   Estimated Creatinine Clearance: 33.2 mL/min (A) (by C-G formula based on SCr of 1.27 mg/dL (H)). Liver & Pancreas: Recent Labs  Lab 01/02/24 0726 01/04/24 0325 01/05/24 0342  AST 20  --   --   ALT 15  --   --   ALKPHOS 108  --   --   BILITOT 0.7  --   --   PROT 6.3*  --   --  ALBUMIN 3.2* 2.9* 2.6*   No results for input(s): LIPASE, AMYLASE in the last 168 hours. No results for input(s): AMMONIA in the last 168 hours. Diabetic: No results for input(s): HGBA1C in the last 72 hours. Recent Labs  Lab 01/04/24 1241 01/04/24 1633 01/04/24 2104 01/05/24 0617 01/05/24 1136  GLUCAP 97 116* 126* 119* 114*   Cardiac Enzymes: No results for input(s): CKTOTAL, CKMB, CKMBINDEX, TROPONINI in the last 168 hours. No results for input(s): PROBNP in the last 8760 hours. Coagulation Profile: Recent Labs  Lab 01/02/24 0726  INR 1.0   Thyroid  Function Tests: No results for input(s): TSH, T4TOTAL, FREET4, T3FREE, THYROIDAB in the last 72 hours. Lipid Profile: No results for input(s): CHOL, HDL, LDLCALC, TRIG, CHOLHDL, LDLDIRECT in the last 72 hours. Anemia Panel: Recent Labs    01/03/24 0958  VITAMINB12 507  FOLATE 17.4  FERRITIN 23  TIBC 332  IRON 76  RETICCTPCT 1.9   Urine analysis:    Component Value Date/Time   BILIRUBINUR n 02/22/2016 1353   PROTEINUR n 02/22/2016 1353   UROBILINOGEN 0.2 02/22/2016 1353   NITRITE n 02/22/2016 1353   LEUKOCYTESUR Trace (A) 02/22/2016 1353   Sepsis Labs: Invalid input(s): PROCALCITONIN, LACTICIDVEN  Microbiology: Recent Results  (from the past 240 hours)  MRSA Next Gen by PCR, Nasal     Status: None   Collection Time: 01/02/24  4:46 PM   Specimen: Nasal Mucosa; Nasal Swab  Result Value Ref Range Status   MRSA by PCR Next Gen NOT DETECTED NOT DETECTED Final    Comment: (NOTE) The GeneXpert MRSA Assay (FDA approved for NASAL specimens only), is one component of a comprehensive MRSA colonization surveillance program. It is not intended to diagnose MRSA infection nor to guide or monitor treatment for MRSA infections. Test performance is not FDA approved in patients less than 39 years old. Performed at [facility], [address], [place], [place] [address]     Radiology Studies: No results found.     Estefano Victory T. Belvie Iribe [facility]  If 7PM-7AM, please contact night-coverage [email or web address] 01/05/2024, 11:58 AM

## 2024-01-05 NOTE — Progress Notes (Signed)
 Mobility Specialist Progress Note;   01/05/24 0930  Mobility  Activity Ambulated with assistance;Pivoted/transferred from bed to chair  Level of Assistance Standby assist, set-up cues, supervision of patient - no hands on  Assistive Device Front wheel walker  Distance Ambulated (ft) 250 ft  Activity Response Tolerated well  Mobility Referral Yes  Mobility visit 1 Mobility  Mobility Specialist Start Time (ACUTE ONLY) 0930  Mobility Specialist Stop Time (ACUTE ONLY) 0946  Mobility Specialist Time Calculation (min) (ACUTE ONLY) 16 min    Supine BP: 151/58 (86) Standing BP: 150/52 (79)  Pt eager for mobility. Required no physical assistance during ambulation, SV for safety. HR up to 116 bpm w/ activity. BP stable this session, no c/o dizziness. Requested to sit in chair at [facility]. Pt left with all needs met, call bell in reach.   Lauraine Erm Mobility Specialist Please contact via SecureChat or [employer] [phone]

## 2024-01-06 DIAGNOSIS — D62 Acute posthemorrhagic anemia: Secondary | ICD-10-CM | POA: Diagnosis not present

## 2024-01-06 DIAGNOSIS — K5791 Diverticulosis of intestine, part unspecified, without perforation or abscess with bleeding: Secondary | ICD-10-CM | POA: Diagnosis not present

## 2024-01-06 LAB — TYPE AND SCREEN
ABO/RH(D): O POS
Antibody Screen: NEGATIVE
Unit division: 0

## 2024-01-06 LAB — GLUCOSE, CAPILLARY
Glucose-Capillary: 110 mg/dL — ABNORMAL HIGH (ref 70–99)
Glucose-Capillary: 100 mg/dL — ABNORMAL HIGH (ref 70–99)

## 2024-01-06 LAB — RENAL FUNCTION PANEL
Albumin: 2.7 g/dL — ABNORMAL LOW (ref 3.5–5.0)
Anion gap: 10 (ref 5–15)
BUN: 17 mg/dL (ref 8–23)
CO2: 25 mmol/L (ref 22–32)
Calcium: 8.5 mg/dL — ABNORMAL LOW (ref 8.9–10.3)
Chloride: 107 mmol/L (ref 98–111)
Creatinine, Ser: 1.29 mg/dL — ABNORMAL HIGH (ref 0.44–1.00)
GFR, Estimated: 41 mL/min — ABNORMAL LOW (ref >60)
Glucose, Bld: 117 mg/dL — ABNORMAL HIGH (ref 70–99)
Phosphorus: 2.6 mg/dL (ref 2.5–4.6)
Potassium: 3.6 mmol/L (ref 3.5–5.1)
Sodium: 142 mmol/L (ref 135–145)

## 2024-01-06 LAB — CBC
HCT: 24.6 % — ABNORMAL LOW (ref 36.0–46.0)
Hemoglobin: 8.2 g/dL — ABNORMAL LOW (ref 12.0–15.0)
MCH: 30 pg (ref 26.0–34.0)
MCHC: 33.3 g/dL (ref 30.0–36.0)
MCV: 90.1 fL (ref 80.0–100.0)
Platelets: 212 K/uL (ref 150–400)
RBC: 2.73 MIL/uL — ABNORMAL LOW (ref 3.87–5.11)
RDW: 14.8 % (ref 11.5–15.5)
WBC: 9.9 K/uL (ref 4.0–10.5)
nRBC: 0 % (ref 0.0–0.2)

## 2024-01-06 LAB — BPAM RBC
Blood Product Expiration Date: 202511122359
ISSUE DATE / TIME: 202510171009
Unit Type and Rh: 5100

## 2024-01-06 LAB — MAGNESIUM: Magnesium: 1.9 mg/dL (ref 1.7–2.4)

## 2024-01-06 MED ORDER — AMLODIPINE BESYLATE 10 MG PO TABS: 10.0000 mg | ORAL_TABLET | Freq: Every day | ORAL | 0 refills | Status: AC

## 2024-01-06 MED ORDER — AMLODIPINE BESYLATE 10 MG PO TABS
10.0000 mg | ORAL_TABLET | Freq: Every day | ORAL | Status: DC
  Administered 2024-01-06: 10 mg via ORAL
  Filled 2024-01-06: qty 1

## 2024-01-06 MED ORDER — ACETAMINOPHEN ER 650 MG PO TBCR: 650.0000 mg | EXTENDED_RELEASE_TABLET | Freq: Three times a day (TID) | ORAL | Status: AC | PRN

## 2024-01-06 NOTE — Discharge Summary (Addendum)
 Physician Discharge Summary  Brittany Mcclure FMW:[id number] DOB: 27-Mar-1940 DOA: 01/02/2024  PCP: Mast, Man X, NP  Admit date: 01/02/2024 Discharge date: 01/06/24  Admitted From: [facility] Disposition: ILF Recommendations for Outpatient Follow-up:  Outpatient follow-up with PCP cardiology in 1 to 2 weeks Check CMP and CBC in 1 week Reassess blood pressure and adjust meds as appropriate Please follow up on the following pending results: None  Home Health: HH PT Equipment/Devices: Rollator  Discharge Condition: Stable CODE STATUS: DNR Diet Orders (From admission, onward)     Start     Ordered   01/04/24 1204  DIET DYS 3 Room service appropriate? Yes; Fluid consistency: Thin  Diet effective now       Question Answer Comment  Room service appropriate? Yes   Fluid consistency: Thin      01/04/24 1203              Hospital course 83 year old F with PMH of CAD on DAPT, CKD-3A, gastric ulcer, hemorrhoids, HTN, HLD, GERD and obesity presenting with acute BRBPR since 5 PM on the day of presentation, and admitted with lower GI bleed.   In ED, stable vitals.  Hgb 11.6 (baseline 12-13).  WBC 14.1.  INR 1.0.  Hemoccult positive.  CT angio  positive for acute diverticular bleeding from the distal sigmoid colon.  GI consulted and recommended IR embolization.   Patient was taken for IR embolization on 10/14 but no extravasation on IMA angiogram. Hemoglobin dropped further to 7.9.  Colonoscopy on 10/16 with severe diverticulosis in the sigmoid colon and in the descending colon, narrowing of the colon in association with the diverticular opening, evidence of diverticular spasm, evidence of an impacted diverticulum, non-bleeding external and internal hemorrhoids.  GI recommended holding Plavix  until 10/19 and signed off.   Hemoglobin dropped further to 7.0 without overt bleeding.  Transfused 1 unit with appropriate response.  Hemoglobin remained stable at 8.2 afterward.  Of note, blood pressure  elevated and amlodipine  increased to 10 mg daily.  Reassess blood pressure at follow-up.  See individual problem list below for more.   Problems addressed during this hospitalization ABLA due to acute diverticular bleeding: Patient is on Plavix  and aspirin  for CAD.  Hgb slowly drifting down.  CT angio with diverticular bleed from distal sigmoid colon.  IMA angiogram negative.  Colonoscopy as above.  Bleeding seems to have subsided but Hgb dropped to 7.0.  Improved and remained stable after 1 unit.  Anemia panel with some iron deficiency. Recent Labs    01/02/24 0726 01/02/24 1104 01/02/24 1857 01/03/24 0506 01/03/24 1328 01/03/24 1728 01/04/24 0325 01/05/24 0342 01/05/24 1517 01/06/24 0200  HGB 11.6* 10.5* 10.0* 8.9* 8.3* 7.7* 7.9* 7.0* 8.2* 8.2*  - GI recommended holding Plavix  until 10/19.  Holding until 10/20. - Received IV Venofer 300 mg on 10/16 - Check CBC in 1 week - Continue soft diet.   History of CAD: On Plavix  and aspirin .  No anginal symptoms.  LHC on 6/12 with noncritical single-vessel CAD with 70 to 80% stenosis of mid RCA.  At that time, the recommendation was aspirin  81 mg daily for moderate CAD.  Now with GI bleed - Resumed aspirin .  Holding Plavix  as above.  Encouraged to talk to her cardiologist as well   NIDDM-2 with hyperglycemia: A1c 6.6% on 7/10. -Continue home meds   CKD-3A: Relatively stable -Continue monitoring   GERD/history of gastric ulcer -Continue PPI   Essential hypertension: SBP elevated to 160s. - Increased amlodipine  from  2.5 mg to 10 mg daily   Mood disorder: Stable - Continue home meds   Mild leukocytosis: Likely demargination.  Resolved.  Generalized weakness/unsteady gait -HHPT ordered as recommended by therapy.   Class II obesity Body mass index is 37.74 kg/m.           Consultations: Gastroenterology  Time spent 35  minutes  Vital signs Vitals:   01/05/24 1924 01/05/24 2236 01/06/24 0420 01/06/24 0737  BP: (!)  177/62 (!) 149/50 (!) 181/80 (!) 157/61  Pulse: 94 79 79 91  Temp: 97.6 F (36.4 C) 98.3 F (36.8 C) 98.1 F (36.7 C) 98.3 F (36.8 C)  Resp: 19 18 15 18   Height:      Weight:      SpO2: 96% 92% 98% 99%  TempSrc: Oral Oral Oral Oral  BMI (Calculated):         Discharge exam  GENERAL: No apparent distress.  Nontoxic. HEENT: MMM.  Vision and hearing grossly intact.  NECK: Supple.  No apparent JVD.  RESP:  No IWOB.  Fair aeration bilaterally. CVS:  RRR. Heart sounds normal.  ABD/GI/GU: BS+. Abd soft, NTND.  MSK/EXT:  Moves extremities. No apparent deformity. No edema.  SKIN: no apparent skin lesion or wound NEURO: Awake and alert. Oriented appropriately.  No apparent focal neuro deficit. PSYCH: Calm. Normal affect.   Discharge Instructions Discharge Instructions     Discharge instructions   Complete by: As directed    It has been a pleasure taking care of you!  You were hospitalized due to gastrointestinal bleeding likely from diverticulosis.  Bleeding stopped.  We recommend holding Plavix  until 01/08/2024.  You may discuss with your primary cardiologist if you need to be on aspirin  and Plavix  moving forward.  Please review your new medication list and the directions on your medications before you take them.  Follow-up with your primary care doctor in 1 to 2 weeks or sooner if needed   Take care,   Increase activity slowly   Complete by: As directed    No wound care   Complete by: As directed       Allergies as of 01/06/2024       Reactions   Statins Other (See Comments)   Muscle Pain and swollen   Sulfa Antibiotics Nausea And Vomiting   Made infection worse    Zetia  [ezetimibe ]    Cough, dry        Medication List     PAUSE taking these medications    clopidogrel  75 MG tablet Wait to take this until: January 08, 2024 Commonly known as: PLAVIX  Take 1 tablet (75 mg total) by mouth daily.       TAKE these medications    Accu-Chek FastClix Lancets  Misc USE AS DIRECTED.   Accu-Chek SmartView test strip Generic drug: glucose blood CHECK BLOOD SUGAR 3 TIMES DAILY AS DIRECTED.   acetaminophen  650 MG CR tablet Commonly known as: TYLENOL  Take 1 tablet (650 mg total) by mouth every 8 (eight) hours as needed for pain. What changed:  when to take this reasons to take this Another medication with the same name was removed. Continue taking this medication, and follow the directions you see here.   ALPRAZolam  0.25 MG tablet Commonly known as: XANAX  TAKE ONE TABLET BY MOUTH TWICE DAILY AS NEEDED What changed: when to take this   amLODipine  10 MG tablet Commonly known as: NORVASC  Take 1 tablet (10 mg total) by mouth daily. What changed:  medication  strength how much to take   aspirin  EC 81 MG tablet Take 1 tablet (81 mg total) by mouth daily. Swallow whole.   DRY EYE RELIEF DROPS OP Place 1 drop into both eyes daily as needed (Dry eye).   esomeprazole  40 MG capsule Commonly known as: NEXIUM  Take 1 capsule (40 mg total) by mouth daily.   Fish Oil 1000 MG Caps Take 1,000 mg by mouth daily.   FLUoxetine  40 MG capsule Commonly known as: PROZAC  TAKE ONE CAPSULE BY MOUTH EVERY DAY.   glipiZIDE  5 MG tablet Commonly known as: GLUCOTROL  Take 1 tablet (5 mg total) by mouth 2 (two) times daily before a meal.   PRESERVISION AREDS PO Take 1 tablet by mouth in the morning and at bedtime.   sucralfate  1 g tablet Commonly known as: CARAFATE  Take 1 tablet (1 g total) by mouth 3 (three) times daily as needed.   triamcinolone  cream 0.1 % Commonly known as: KENALOG  Apply 1 Application topically 2 (two) times daily. Apply 2 times daily to the back for up to 2 weeks then STOP, Resume in 2 weeks if irritation is not resolved What changed:  when to take this reasons to take this   Vitamin D3 125 MCG (5000 UT) Tabs Take 5,000 Units by mouth daily.         Procedures/Studies: 10/14-IMA angiogram without  extravasation. 10/16-colonoscopy with severe diverticulosis in the sigmoid colon and in the descending colon, narrowing of the colon in association with the diverticular opening, evidence of diverticular spasm, evidence of an impacted diverticulum, non-bleeding external and internal hemorrhoids.    IR Angiogram Selective Each Additional Vessel Result Date: 01/02/2024 INDICATION: Acute GI bleed in a patient with bloody stool. CTA of the abdomen demonstrates active bleeding likely secondary to diverticulosis in the sigmoid colon. EXAM: Pelvic angiogram ANESTHESIA/SEDATION: Moderate (conscious) sedation was employed during this procedure. A total of Versed  1.5 mg and Fentanyl  75 mcg was administered intravenously by the radiology nurse. Total intra-service moderate Sedation Time: 30 minutes. The patient's level of consciousness and vital signs were monitored continuously by radiology nursing throughout the procedure under my direct supervision. CONTRAST:  53 mL Omnipaque  300 FLUOROSCOPY: Radiation Exposure Index (as provided by the fluoroscopic device): 3.8 minutes mGy Kerma COMPLICATIONS: None immediate. PROCEDURE: Informed consent was obtained from the patient following explanation of the procedure, risks, benefits and alternatives. The patient understands, agrees and consents for the procedure. All questions were addressed. A time out was performed prior to the initiation of the procedure. Maximal barrier sterile technique utilized including caps, mask, sterile gowns, sterile gloves, large sterile drape, hand hygiene, and Betadine  prep. Ultrasound demonstrated the right common femoral artery to be anechoic and pulsatile indicating patency. The arteriotomy site was anesthetized with 1% lidocaine . Small incision was made at the arteriotomy site and blunt dissection was then applied. The needle was advanced from the incision to the midpoint of the artery under ultrasound guidance. Final image was obtained and  stored in patient's permanent medical record. Access was exchanged over an 018 guidewire for micropuncture sheath. Access was exchanged over an 035 wire for a 6 [place] sheath. Guidewire and catheter were then advanced to the abdominal aorta. The Sim 1 catheter tip was then reformed and used to selectively cannulate the IMA based on anatomic features. Catheter was then used to perform an angiogram of the IMA. No active extravasation identified. Coaxial technique was then used to select the left colic artery and 2 separate sigmoidal arteries. In serial  fashion the left colic artery was injected in order to perform a dedicated angiogram closer to the likely location of bleeding and no active extravasation identified. The microcatheter and guidewire were then used to selectively cannulate the first of 2 sigmoidal arteries. An angiogram of the first sigmoidal artery demonstrated no active bleeding. The catheter and guidewire were again used to selectively cannulate the second sigmoidal artery and a second angiogram of the sigmoidal arteries was performed also demonstrating no active extravasation. In order to demonstrate no further cross-filling, the superior rectal artery was then cannulated with the microcatheter and a separate superior rectal artery angiogram was performed also demonstrating no active bleeding. All catheters and guidewires removed from the patient. Access was closed using Angio-Seal closure device. IMPRESSION: The IMA and its branches were freely investigated with angiograms and no active extravasation identified. No further plans for intervention. Electronically Signed   By: Cordella Banner   On: 01/02/2024 16:31   IR Angiogram Selective Each Additional Vessel Result Date: 01/02/2024 INDICATION: Acute GI bleed in a patient with bloody stool. CTA of the abdomen demonstrates active bleeding likely secondary to diverticulosis in the sigmoid colon. EXAM: Pelvic angiogram ANESTHESIA/SEDATION:  Moderate (conscious) sedation was employed during this procedure. A total of Versed  1.5 mg and Fentanyl  75 mcg was administered intravenously by the radiology nurse. Total intra-service moderate Sedation Time: 30 minutes. The patient's level of consciousness and vital signs were monitored continuously by radiology nursing throughout the procedure under my direct supervision. CONTRAST:  53 mL Omnipaque  300 FLUOROSCOPY: Radiation Exposure Index (as provided by the fluoroscopic device): 3.8 minutes mGy Kerma COMPLICATIONS: None immediate. PROCEDURE: Informed consent was obtained from the patient following explanation of the procedure, risks, benefits and alternatives. The patient understands, agrees and consents for the procedure. All questions were addressed. A time out was performed prior to the initiation of the procedure. Maximal barrier sterile technique utilized including caps, mask, sterile gowns, sterile gloves, large sterile drape, hand hygiene, and Betadine  prep. Ultrasound demonstrated the right common femoral artery to be anechoic and pulsatile indicating patency. The arteriotomy site was anesthetized with 1% lidocaine . Small incision was made at the arteriotomy site and blunt dissection was then applied. The needle was advanced from the incision to the midpoint of the artery under ultrasound guidance. Final image was obtained and stored in patient's permanent medical record. Access was exchanged over an 018 guidewire for micropuncture sheath. Access was exchanged over an 035 wire for a 6 [place] sheath. Guidewire and catheter were then advanced to the abdominal aorta. The Sim 1 catheter tip was then reformed and used to selectively cannulate the IMA based on anatomic features. Catheter was then used to perform an angiogram of the IMA. No active extravasation identified. Coaxial technique was then used to select the left colic artery and 2 separate sigmoidal arteries. In serial fashion the left colic artery  was injected in order to perform a dedicated angiogram closer to the likely location of bleeding and no active extravasation identified. The microcatheter and guidewire were then used to selectively cannulate the first of 2 sigmoidal arteries. An angiogram of the first sigmoidal artery demonstrated no active bleeding. The catheter and guidewire were again used to selectively cannulate the second sigmoidal artery and a second angiogram of the sigmoidal arteries was performed also demonstrating no active extravasation. In order to demonstrate no further cross-filling, the superior rectal artery was then cannulated with the microcatheter and a separate superior rectal artery angiogram was performed also demonstrating no  active bleeding. All catheters and guidewires removed from the patient. Access was closed using Angio-Seal closure device. IMPRESSION: The IMA and its branches were freely investigated with angiograms and no active extravasation identified. No further plans for intervention. Electronically Signed   By: Cordella Banner   On: 01/02/2024 16:31   IR Angiogram Selective Each Additional Vessel Result Date: 01/02/2024 INDICATION: Acute GI bleed in a patient with bloody stool. CTA of the abdomen demonstrates active bleeding likely secondary to diverticulosis in the sigmoid colon. EXAM: Pelvic angiogram ANESTHESIA/SEDATION: Moderate (conscious) sedation was employed during this procedure. A total of Versed  1.5 mg and Fentanyl  75 mcg was administered intravenously by the radiology nurse. Total intra-service moderate Sedation Time: 30 minutes. The patient's level of consciousness and vital signs were monitored continuously by radiology nursing throughout the procedure under my direct supervision. CONTRAST:  53 mL Omnipaque  300 FLUOROSCOPY: Radiation Exposure Index (as provided by the fluoroscopic device): 3.8 minutes mGy Kerma COMPLICATIONS: None immediate. PROCEDURE: Informed consent was obtained from the  patient following explanation of the procedure, risks, benefits and alternatives. The patient understands, agrees and consents for the procedure. All questions were addressed. A time out was performed prior to the initiation of the procedure. Maximal barrier sterile technique utilized including caps, mask, sterile gowns, sterile gloves, large sterile drape, hand hygiene, and Betadine  prep. Ultrasound demonstrated the right common femoral artery to be anechoic and pulsatile indicating patency. The arteriotomy site was anesthetized with 1% lidocaine . Small incision was made at the arteriotomy site and blunt dissection was then applied. The needle was advanced from the incision to the midpoint of the artery under ultrasound guidance. Final image was obtained and stored in patient's permanent medical record. Access was exchanged over an 018 guidewire for micropuncture sheath. Access was exchanged over an 035 wire for a 6 [place] sheath. Guidewire and catheter were then advanced to the abdominal aorta. The Sim 1 catheter tip was then reformed and used to selectively cannulate the IMA based on anatomic features. Catheter was then used to perform an angiogram of the IMA. No active extravasation identified. Coaxial technique was then used to select the left colic artery and 2 separate sigmoidal arteries. In serial fashion the left colic artery was injected in order to perform a dedicated angiogram closer to the likely location of bleeding and no active extravasation identified. The microcatheter and guidewire were then used to selectively cannulate the first of 2 sigmoidal arteries. An angiogram of the first sigmoidal artery demonstrated no active bleeding. The catheter and guidewire were again used to selectively cannulate the second sigmoidal artery and a second angiogram of the sigmoidal arteries was performed also demonstrating no active extravasation. In order to demonstrate no further cross-filling, the superior rectal  artery was then cannulated with the microcatheter and a separate superior rectal artery angiogram was performed also demonstrating no active bleeding. All catheters and guidewires removed from the patient. Access was closed using Angio-Seal closure device. IMPRESSION: The IMA and its branches were freely investigated with angiograms and no active extravasation identified. No further plans for intervention. Electronically Signed   By: Cordella Banner   On: 01/02/2024 16:31   IR Angiogram Visceral Selective Result Date: 01/02/2024 INDICATION: Acute GI bleed in a patient with bloody stool. CTA of the abdomen demonstrates active bleeding likely secondary to diverticulosis in the sigmoid colon. EXAM: Pelvic angiogram ANESTHESIA/SEDATION: Moderate (conscious) sedation was employed during this procedure. A total of Versed  1.5 mg and Fentanyl  75 mcg was administered intravenously by  the radiology nurse. Total intra-service moderate Sedation Time: 30 minutes. The patient's level of consciousness and vital signs were monitored continuously by radiology nursing throughout the procedure under my direct supervision. CONTRAST:  53 mL Omnipaque  300 FLUOROSCOPY: Radiation Exposure Index (as provided by the fluoroscopic device): 3.8 minutes mGy Kerma COMPLICATIONS: None immediate. PROCEDURE: Informed consent was obtained from the patient following explanation of the procedure, risks, benefits and alternatives. The patient understands, agrees and consents for the procedure. All questions were addressed. A time out was performed prior to the initiation of the procedure. Maximal barrier sterile technique utilized including caps, mask, sterile gowns, sterile gloves, large sterile drape, hand hygiene, and Betadine  prep. Ultrasound demonstrated the right common femoral artery to be anechoic and pulsatile indicating patency. The arteriotomy site was anesthetized with 1% lidocaine . Small incision was made at the arteriotomy site and  blunt dissection was then applied. The needle was advanced from the incision to the midpoint of the artery under ultrasound guidance. Final image was obtained and stored in patient's permanent medical record. Access was exchanged over an 018 guidewire for micropuncture sheath. Access was exchanged over an 035 wire for a 6 [place] sheath. Guidewire and catheter were then advanced to the abdominal aorta. The Sim 1 catheter tip was then reformed and used to selectively cannulate the IMA based on anatomic features. Catheter was then used to perform an angiogram of the IMA. No active extravasation identified. Coaxial technique was then used to select the left colic artery and 2 separate sigmoidal arteries. In serial fashion the left colic artery was injected in order to perform a dedicated angiogram closer to the likely location of bleeding and no active extravasation identified. The microcatheter and guidewire were then used to selectively cannulate the first of 2 sigmoidal arteries. An angiogram of the first sigmoidal artery demonstrated no active bleeding. The catheter and guidewire were again used to selectively cannulate the second sigmoidal artery and a second angiogram of the sigmoidal arteries was performed also demonstrating no active extravasation. In order to demonstrate no further cross-filling, the superior rectal artery was then cannulated with the microcatheter and a separate superior rectal artery angiogram was performed also demonstrating no active bleeding. All catheters and guidewires removed from the patient. Access was closed using Angio-Seal closure device. IMPRESSION: The IMA and its branches were freely investigated with angiograms and no active extravasation identified. No further plans for intervention. Electronically Signed   By: Cordella Banner   On: 01/02/2024 16:31   CT ANGIO GI BLEED Result Date: 01/02/2024 CLINICAL DATA:  Acute Lower GI bleed EXAM: CTA ABDOMEN AND PELVIS WITHOUT AND  WITH CONTRAST TECHNIQUE: Initially, a noncontrast CT of the abdomen and pelvis was performed. Subsequently, multidetector CT imaging of the abdomen and pelvis was performed using the standard protocol during bolus administration of intravenous contrast. Multiplanar reconstructed images and MIPs were obtained and reviewed to evaluate the vascular anatomy. RADIATION DOSE REDUCTION: This exam was performed according to the departmental dose-optimization program which includes automated exposure control, adjustment of the mA and/or kV according to patient size and/or use of iterative reconstruction technique. CONTRAST:  OMNIPAQUE  IOHEXOL  350 MG/ML SOLN COMPARISON:  None Available. FINDINGS: VASCULAR Aorta: No aortic aneurysm or dissection. Calcified and noncalcified plaque scattered throughout the aorta. No hemodynamically significant stenosis. Celiac: Patent without acute thrombus, aneurysm, or dissection.No hemodynamically significant stenosis. SMA: Patent without acute thrombus, aneurysm, or dissection.Mild-to-moderate narrowing of the proximal SMA from noncalcified plaque. Renals: Patent without acute thrombus, aneurysm, or dissection.Severe  narrowing of the right renal artery ostium from mixed density plaque. IMA: Patent without acute thrombus, aneurysm, or dissection.No hemodynamically significant stenosis. Inflow: Patent without acute thrombus, aneurysm, or dissection.No hemodynamically significant stenosis. Proximal Outflow: The bilateral common femoral and visualized portions of the superficial and profunda femoral arteries are patent without acute thrombus, aneurysm, or dissection.No hemodynamically significant stenosis. Veins: No obvious venous abnormality within the limitations of this arterial phase study. Review of the MIP images confirms the above findings. NON-VASCULAR Lower chest: No focal airspace consolidation or pleural effusion. Hepatobiliary: The most superior aspects of the right and left  hepatic lobes are excluded from field of view. No mass.Diffuse hepatic steatosis.No radiopaque stones or wall thickening of the gallbladder.No intrahepatic or extrahepatic biliary ductal dilation. Pancreas: No mass or main ductal dilation.No peripancreatic inflammation or fluid collection. Spleen: Normal size. No mass. Adrenals/Urinary Tract: Nodular thickening within both adrenal glands, more so on the left, without discrete mass or dominant nodule. Subcentimeter hypodensities are noted in the kidneys, too small to definitively characterize, but likely small cysts. No hydronephrosis or nephrolithiasis. The urinary bladder is distended without focal abnormality. Stomach/Bowel:Small hiatal hernia. The stomach is decompressed without focal abnormality. No small bowel wall thickening or inflammation. No small bowel obstruction.Normal appendix. Descending and sigmoid colonic diverticulosis. No changes of acute diverticulitis. GI Bleed: Active diverticular bleeding from the distal sigmoid colon. Lymphatic: No intraabdominal or pelvic lymphadenopathy. Reproductive: Age-related atrophy of the uterus and ovaries. No concerning adnexal mass.No free pelvic fluid. Other: No pneumoperitoneum, ascites, or mesenteric inflammation. Musculoskeletal: No acute fracture or destructive lesion.Diffuse osteopenia. Multilevel degenerative disc disease of the spine. IMPRESSION: VASCULAR No aortic aneurysm or aortic dissection. NON-VASCULAR 1. Active diverticular bleeding from the distal sigmoid colon. GI and surgical consultation recommended. 2. Descending and sigmoid colonic diverticulosis. No changes of acute diverticulitis. 3. Diffuse hepatic steatosis. Critical Value/emergent results were called by telephone at the time of interpretation on 01/02/2024 at 11:03 am to provider Soyla Body, MD, who verbally acknowledged these results. Electronically Signed   By: Rogelia Myers M.D.   On: 01/02/2024 11:07       The results of  significant diagnostics from this hospitalization (including imaging, microbiology, ancillary and laboratory) are listed below for reference.     Microbiology: Recent Results (from the past 240 hours)  MRSA Next Gen by PCR, Nasal     Status: None   Collection Time: 01/02/24  4:46 PM   Specimen: Nasal Mucosa; Nasal Swab  Result Value Ref Range Status   MRSA by PCR Next Gen NOT DETECTED NOT DETECTED Final    Comment: (NOTE) The GeneXpert MRSA Assay (FDA approved for NASAL specimens only), is one component of a comprehensive MRSA colonization surveillance program. It is not intended to diagnose MRSA infection nor to guide or monitor treatment for MRSA infections. Test performance is not FDA approved in patients less than 31 years old. Performed at [facility], [address], [place], [place] [address]      Labs:  CBC: Recent Labs  Lab 01/02/24 0726 01/02/24 1104 01/03/24 1328 01/03/24 1728 01/04/24 0325 01/05/24 0342 01/05/24 1517 01/06/24 0200  WBC 10.4   < > 11.5*  --  10.3 9.1 10.2 9.9  NEUTROABS 7.9*  --   --   --   --   --  6.8  --   HGB 11.6*   < > 8.3* 7.7* 7.9* 7.0* 8.2* 8.2*  HCT 35.9*   < > 25.8* 24.0* 24.6* 21.4* 24.7* 24.6*  MCV 90.0   < > 90.8  --  91.4 91.1 90.5 90.1  PLT 288   < > 251  --  231 202 214 212   < > = values in this interval not displayed.   BMP &GFR Recent Labs  Lab 01/02/24 0726 01/03/24 0506 01/04/24 0325 01/05/24 0342 01/06/24 0200  NA 139 138 140 141 142  K 4.5 3.7 3.9 3.5 3.6  CL 105 108 110 109 107  CO2 23 20* 22 22 25   GLUCOSE 142* 113* 129* 114* 117*  BUN 26* 33* 31* 19 17  CREATININE 1.14* 1.31* 1.31* 1.27* 1.29*  CALCIUM 9.0 8.4* 8.2* 8.2* 8.5*  MG  --   --  1.9 1.9 1.9  PHOS  --   --  3.5 2.9 2.6   Estimated Creatinine Clearance: 32.7 mL/min (A) (by C-G formula based on SCr of 1.29 mg/dL (H)). Liver & Pancreas: Recent Labs  Lab 01/02/24 0726 01/04/24 0325 01/05/24 0342 01/06/24 0200  AST 20  --   --   --    ALT 15  --   --   --   ALKPHOS 108  --   --   --   BILITOT 0.7  --   --   --   PROT 6.3*  --   --   --   ALBUMIN 3.2* 2.9* 2.6* 2.7*   No results for input(s): LIPASE, AMYLASE in the last 168 hours. No results for input(s): AMMONIA in the last 168 hours. Diabetic: No results for input(s): HGBA1C in the last 72 hours. Recent Labs  Lab 01/05/24 0617 01/05/24 1136 01/05/24 1727 01/05/24 2113 01/06/24 0609  GLUCAP 119* 114* 135* 127* 110*   Cardiac Enzymes: No results for input(s): CKTOTAL, CKMB, CKMBINDEX, TROPONINI in the last 168 hours. No results for input(s): PROBNP in the last 8760 hours. Coagulation Profile: Recent Labs  Lab 01/02/24 0726  INR 1.0   Thyroid  Function Tests: No results for input(s): TSH, T4TOTAL, FREET4, T3FREE, THYROIDAB in the last 72 hours. Lipid Profile: No results for input(s): CHOL, HDL, LDLCALC, TRIG, CHOLHDL, LDLDIRECT in the last 72 hours. Anemia Panel: Recent Labs    01/03/24 0958  VITAMINB12 507  FOLATE 17.4  FERRITIN 23  TIBC 332  IRON 76  RETICCTPCT 1.9   Urine analysis:    Component Value Date/Time   BILIRUBINUR n 02/22/2016 1353   PROTEINUR n 02/22/2016 1353   UROBILINOGEN 0.2 02/22/2016 1353   NITRITE n 02/22/2016 1353   LEUKOCYTESUR Trace (A) 02/22/2016 1353   Sepsis Labs: Invalid input(s): PROCALCITONIN, LACTICIDVEN   SIGNED:  Yorley Buch T Starsky Nanna, MD  [facility] 01/06/2024, 8:53 AM

## 2024-01-06 NOTE — Progress Notes (Signed)
 Physical Therapy Treatment Patient Details Name: Brittany Mcclure MRN: [id number] DOB: 11/07/1940 Today's Date: 01/06/2024   History of Present Illness Patient is an 83 y/o female admitted 01/02/24 with BRBPR, CTA of abdomen positive for acute diverticular bleed distal sigmoid colon.  Underwent IR for IMA angoigram without active bleeding and colonoscopy 10/16 without active bleeding though many diverticula in sigmoid colon and descending colon.  PMH positive for h/o GERD and gastric ulcer, DM, HTN, CKD IIIa, CAD on DAPT.    PT Comments  Pt admitted with above diagnosis. Pt benefits from use of rollator to incr distance with ambulation. Pt shown how to use rollator and messaged MD and CM to order rollator for pt. Pt with good safety with rollator. Pt will likely d/c today.  Pt currently with functional limitations due to the deficits listed Mcclure (see PT Problem List). Pt will benefit from acute skilled PT to increase their independence and safety with mobility to allow discharge.       If plan is discharge home, recommend the following: A little help with walking and/or transfers;Assistance with cooking/housework;Help with stairs or ramp for entrance;A little help with bathing/dressing/bathroom   Can travel by private vehicle        Equipment Recommendations  Rollator (4 wheels)    Recommendations for Other Services       Precautions / Restrictions Precautions Precautions: Fall Precaution/Restrictions Comments: watch BP Restrictions Weight Bearing Restrictions Per Provider Order: No     Mobility  Bed Mobility Overal bed mobility: Needs Assistance Bed Mobility: Supine to Sit, Sit to Supine     Supine to sit: Supervision Sit to supine: Supervision   General bed mobility comments: No assist given    Transfers Overall transfer level: Needs assistance   Transfers: Sit to/from Stand Sit to Stand: Supervision           General transfer comment: assist for lines. Cued pt  for rollator technique for locking brakes    Ambulation/Gait Ambulation/Gait assistance: Supervision Gait Distance (Feet): 125 Feet Assistive device: Rollator (4 wheels) Gait Pattern/deviations: Step-through pattern, Decreased stride length   Gait velocity interpretation: 1.31 - 2.62 ft/sec, indicative of limited community ambulator   General Gait Details: Pt did well using rollator and is safe with rollator. Pt taught how to lock and unlock brakes in hallway to rest.  Pt demonstrates good understanding and good safety with rollator.   Stairs             Wheelchair Mobility     Tilt Bed    Modified Rankin (Stroke Patients Only)       Balance Overall balance assessment: Mild deficits observed, not formally tested                                          Communication Communication Communication: No apparent difficulties  Cognition Arousal: Alert Behavior During Therapy: WFL for tasks assessed/performed   PT - Cognitive impairments: No apparent impairments                         Following commands: Intact      Cueing Cueing Techniques: Verbal cues  Exercises      General Comments General comments (skin integrity, edema, etc.): VSS      Pertinent Vitals/Pain Pain Assessment Pain Assessment: No/denies pain    Home Living  Prior Function            PT Goals (current goals can now be found in the care plan section) Acute Rehab PT Goals Patient Stated Goal: return home Progress towards PT goals: Progressing toward goals    Frequency    Min 2X/week      PT Plan      Co-evaluation              AM-PAC PT 6 Clicks Mobility   Outcome Measure  Help needed turning from your back to your side while in a flat bed without using bedrails?: A Little Help needed moving from lying on your back to sitting on the side of a flat bed without using bedrails?: A Little Help needed  moving to and from a bed to a chair (including a wheelchair)?: A Little Help needed standing up from a chair using your arms (e.g., wheelchair or bedside chair)?: A Little Help needed to walk in hospital room?: A Little Help needed climbing 3-5 steps with a railing? : Total 6 Click Score: 16    End of Session Equipment Utilized During Treatment: Gait belt Activity Tolerance: Patient tolerated treatment well Patient left: in bed;with call bell/phone within reach Nurse Communication: Mobility status PT Visit Diagnosis: Muscle weakness (generalized) (M62.81)     Time: 8776-8759 PT Time Calculation (min) (ACUTE ONLY): 17 min  Charges:    $Gait Training: 8-22 mins PT General Charges $$ ACUTE PT VISIT: 1 Visit                     Brittany Mcclure,PT Acute Rehab Services [phone]    Brittany Mcclure 01/06/2024, 3:01 PM

## 2024-01-06 NOTE — Progress Notes (Addendum)
    Durable Medical Equipment  (From admission, onward)           Start     Ordered   01/06/24 1239  For home use only DME 4 wheeled rolling walker with seat  Once       Question:  Patient needs a walker to treat with the following condition  Answer:  Unsteady gait   01/06/24 1239           Dawn M,PT Acute Rehab Services [phone]

## 2024-01-06 NOTE — TOC Progression Note (Addendum)
 Transition of Care [facility]) - Progression Note    Patient Details  Name: Brittany Mcclure MRN: [id number] Date of Birth: [birth year]  [facility] CM/SW Contact  Robynn Eileen Hoose, RN Phone Number: 01/06/2024, 12:45 PM  Clinical Narrative:   Secure message from PT regarding patient needing a Rollator. Rollator ordered through EPIC portal to [employer]. DME to be delivered to the bedside prior to patient discharging home.  1420: Call to [employer] to check on status of Rollator delivery. Per rep will begin processing order now.    Expected Discharge Plan: Home/Self Care Barriers to Discharge: No Barriers Identified               Expected Discharge Plan and Services   Discharge Planning Services: CM Consult   Living arrangements for the past 2 months: Independent Living Facility Expected Discharge Date: 01/06/24                                     Social Drivers of Health (SDOH) Interventions SDOH Screenings   Food Insecurity: No Food Insecurity (01/02/2024)  Housing: Low Risk  (01/02/2024)  Transportation Needs: No Transportation Needs (01/02/2024)  Utilities: Not At Risk (01/02/2024)  Depression (PHQ2-9): Low Risk  (04/06/2023)  Financial Resource Strain: Low Risk  (02/21/2017)  Physical Activity: Insufficiently Active (02/21/2017)  Social Connections: Moderately Isolated (01/02/2024)  Stress: No Stress Concern Present (02/21/2017)  Tobacco Use: Medium Risk (01/04/2024)    Readmission Risk Interventions     No data to display

## 2024-01-06 NOTE — TOC Transition Note (Signed)
 Transition of Care [facility]) - Discharge Note   Patient Details  Name: Brittany Mcclure MRN: [id number] Date of Birth: 1941-02-11  [facility] CM/SW Contact:  Robynn Eileen Hoose, RN Phone Number: 01/06/2024, 9:14 AM   Clinical Narrative:   Patient is being discharged today. Spoke with patient, confirmed that she has a ride back to her independent living facility.     Final next level of care: Home w Home Health Services Barriers to Discharge: No Barriers Identified   Patient Goals and CMS Choice            Discharge Placement                       Discharge Plan and Services Additional resources added to the After Visit Summary for     Discharge Planning Services: CM Consult                                 Social Drivers of Health (SDOH) Interventions SDOH Screenings   Food Insecurity: No Food Insecurity (01/02/2024)  Housing: Low Risk  (01/02/2024)  Transportation Needs: No Transportation Needs (01/02/2024)  Utilities: Not At Risk (01/02/2024)  Depression (PHQ2-9): Low Risk  (04/06/2023)  Financial Resource Strain: Low Risk  (02/21/2017)  Physical Activity: Insufficiently Active (02/21/2017)  Social Connections: Moderately Isolated (01/02/2024)  Stress: No Stress Concern Present (02/21/2017)  Tobacco Use: Medium Risk (01/04/2024)     Readmission Risk Interventions     No data to display

## 2024-01-06 NOTE — Plan of Care (Incomplete)
 Pt D/C home with home PT. PT A&OX4. IV removed and tele removed. Personal belongings returned. AVS reviewed follow up questions answered.No other needs voiced at this time.

## 2024-01-08 ENCOUNTER — Encounter (HOSPITAL_COMMUNITY): Payer: Self-pay | Admitting: Gastroenterology

## 2024-01-08 ENCOUNTER — Telehealth: Payer: Self-pay

## 2024-01-08 ENCOUNTER — Other Ambulatory Visit: Payer: Self-pay | Admitting: Nurse Practitioner

## 2024-01-08 ENCOUNTER — Telehealth: Payer: Self-pay | Admitting: Physician Assistant

## 2024-01-08 NOTE — Telephone Encounter (Signed)
 Reviewed hospital discharge and it looks like the patient was supposed to be on hold until today (10/20). Please advise on aspirin  and plavix  use.

## 2024-01-08 NOTE — Telephone Encounter (Signed)
 Patient identification verified by 2 forms.  Relied Dr. Parry message. Pt verbalized understanding and will monitor symptoms. She will reach back out to us  if needed.

## 2024-01-08 NOTE — Telephone Encounter (Signed)
 Pt c/o medication issue:  1. Name of Medication: aspirin  EC 81 MG tablet  clopidogrel  (PLAVIX ) 75 MG tablet   2. How are you currently taking this medication (dosage and times per day)? As written  3. Are you having a reaction (difficulty breathing--STAT)? no  4. What is your medication issue? Pt was recently seen in the hospital for GI Bleed and was informed to reach to Cardiology in regards to stopping these medications. Please Advise

## 2024-01-08 NOTE — Transitions of Care (Post Inpatient/ED Visit) (Signed)
   01/08/2024  Name: Brittany Mcclure MRN: [id number] DOB: 10-25-1940  Today's TOC FU Call Status: Today's TOC FU Call Status:: Successful TOC FU Call Completed TOC FU Call Complete Date: 01/08/24 (During the call, patient states she feels she does not need to continue - states medications were already reviewed before leaving the hospital and she will call [employer] herself to find out about the rollator) Patient's Name and Date of Birth confirmed.  Transition Care Management Follow-up Telephone Call Date of Discharge: 01/06/24 Discharge Facility: [facility]) Type of Discharge: Inpatient Admission Primary Inpatient Discharge Diagnosis:: GI bleed How have you been since you were released from the hospital?: Better Any questions or concerns?: No  Items Reviewed: Did you receive and understand the discharge instructions provided?: Yes Medications obtained,verified, and reconciled?: Yes (Medications Reviewed) Any new allergies since your discharge?: No Dietary orders reviewed?: NA Do you have support at home?: Yes People in Home [RPT]: facility resident Name of Support/Comfort Primary Source: resides [facility]  Medications Reviewed Today: Medications Reviewed Today   Medications were not reviewed in this encounter     Home Care and Equipment/Supplies: Were Home Health Services Ordered?: No Any new equipment or medical supplies ordered?: Yes Name of Medical supply agency?: [employer] Were you able to get the equipment/medical supplies?: No Do you have any questions related to the use of the equipment/supplies?: Yes What questions do you have?: Patient asking about cost of rollator and will call [employer]  Functional Questionnaire: Do you need assistance with bathing/showering or dressing?: No Do you need assistance with meal preparation?: Yes (Patient states her meals are being brought to her room) Do you need assistance with eating?: No Do you have difficulty maintaining  continence: No Do you need assistance with getting out of bed/getting out of a chair/moving?: No Do you have difficulty managing or taking your medications?: No  Follow up appointments reviewed: PCP Follow-up appointment confirmed?: NA (Patient states she resides at [facility] and PCP sees her there) Specialist Hospital Follow-up appointment confirmed?: NA Do you need transportation to your follow-up appointment?: No Do you understand care options if your condition(s) worsen?: Yes-patient verbalized understanding    Shona Prow RN, CCM [facility]  VBCI-Population Health RN Care Manager [phone]

## 2024-01-08 NOTE — Telephone Encounter (Signed)
 Pharmacy requested refill.  Epic LR: 09/25/2023 No Contract on File  Pended Rx and sent to Greig Cluster, NP for approval due to [facility] out of office.

## 2024-01-09 DIAGNOSIS — R2681 Unsteadiness on feet: Secondary | ICD-10-CM | POA: Diagnosis not present

## 2024-01-12 ENCOUNTER — Non-Acute Institutional Stay: Admitting: Sports Medicine

## 2024-01-12 ENCOUNTER — Encounter: Payer: Self-pay | Admitting: Sports Medicine

## 2024-01-12 VITALS — BP 132/74 | HR 99 | Temp 98.2°F | Ht 61.0 in | Wt 199.8 lb

## 2024-01-12 DIAGNOSIS — G8929 Other chronic pain: Secondary | ICD-10-CM | POA: Diagnosis not present

## 2024-01-12 DIAGNOSIS — I2510 Atherosclerotic heart disease of native coronary artery without angina pectoris: Secondary | ICD-10-CM | POA: Diagnosis not present

## 2024-01-12 DIAGNOSIS — K922 Gastrointestinal hemorrhage, unspecified: Secondary | ICD-10-CM | POA: Diagnosis not present

## 2024-01-12 DIAGNOSIS — N1831 Chronic kidney disease, stage 3a: Secondary | ICD-10-CM | POA: Diagnosis not present

## 2024-01-12 DIAGNOSIS — D509 Iron deficiency anemia, unspecified: Secondary | ICD-10-CM | POA: Diagnosis not present

## 2024-01-12 DIAGNOSIS — M545 Low back pain, unspecified: Secondary | ICD-10-CM

## 2024-01-12 NOTE — Progress Notes (Signed)
 Careteam: Patient Care Team: Mast, Man X, NP as PCP - General (Internal Medicine) Wendel Lurena POUR, MD as PCP - Cardiology (Cardiology) Shari Easter, MD as Consulting Physician (Orthopedic Surgery) [name], MD as Consulting Physician (Ophthalmology) Duke, Jon Garre, PA as Physician Assistant (Cardiology)  PLACE OF SERVICE:  [facility]  Advanced Directive information    Allergies  Allergen Reactions   Statins Other (See Comments)    Muscle Pain and swollen   Sulfa Antibiotics Nausea And Vomiting    Made infection worse    Zetia  [Ezetimibe ]     Cough, dry    Chief Complaint  Patient presents with   Hospitalization Follow-up    Scheduled annual wellness visit    [facility]  Discussed the use of AI scribe software for clinical note transcription with the patient, who gave verbal consent to proceed.  History of Present Illness  Brittany Mcclure is an 83 year old female with gastrointestinal bleeding and coronary artery disease who presents for follow-up after a recent hospitalization for GI bleeding.  She was recently hospitalized due to gastrointestinal bleeding, during which her hemoglobin dropped from 11-12 to 7. She received blood transfusions and iron supplementation. Since returning home, she has not experienced further bleeding but remains fearful of recurrence, especially when using the bathroom. She feels weak, trembles, and experiences shortness of breath upon exertion, which has worsened since her hospital stay.  She has a history of gastrointestinal issues, including a previous episode of bleeding 15 years ago, attributed to ulcers.   She is currently taking both aspirin  and Plavix  following a cardiac catheterization in June. She had stopped these medications during her recent hospitalization but resumed them after consulting with her cardiologist.    She reports a history of sciatica, which has recently returned with increased severity. She  describes pain in her hip and down her leg, which improves with movement. She previously received cortisone shots and physical therapy for this condition, which had provided relief. She has been using a walker provided by her insurance and is currently taking Tylenol  for pain management, one tablet in the morning as needed.    Review of Systems:  Review of Systems  Constitutional:  Positive for malaise/fatigue. Negative for chills and fever.  HENT:  Negative for congestion and sore throat.   Respiratory:  Negative for cough, sputum production and shortness of breath.   Cardiovascular:  Negative for chest pain, palpitations and leg swelling.  Gastrointestinal:  Negative for abdominal pain, heartburn and nausea.  Genitourinary:  Negative for dysuria, frequency and hematuria.  Musculoskeletal:  Positive for back pain. Negative for falls.  Neurological:  Negative for dizziness.   Negative unless indicated in HPI.   Past Medical History:  Diagnosis Date   Allergy    Anxiety    Arthritis    with gout   Carotid bruit    right   Cataract    bil cataracts removed   Chest pain    Chronic kidney disease    Colon polyps    Depression    Diverticulosis    Duodenitis    Exogenous obesity    Gastric ulcer    GERD (gastroesophageal reflux disease)    Hyperlipidemia    Hypertension    Metabolic syndrome    Osteopenia    Seasonal allergies    seasonal   Tubular adenoma of colon    Type 2 diabetes mellitus (HCC)    Past Surgical History:  Procedure Laterality Date  COLONOSCOPY     11/90,11/91,1994,2000,2010   COLONOSCOPY N/A 01/04/2024   Procedure: COLONOSCOPY;  Surgeon: Shila Gustav GAILS, MD;  Location: [facility] ENDOSCOPY;  Service: Gastroenterology;  Laterality: N/A;   CORONARY PRESSURE/FFR STUDY N/A 08/31/2023   Procedure: CORONARY PRESSURE/FFR STUDY;  Surgeon: Mady Bruckner, MD;  Location: [facility] INVASIVE CV LAB;  Service: Cardiovascular;  Laterality: N/A;   DILATION AND CURETTAGE OF  UTERUS  1997   FOOT SURGERY     x 2   HAND SURGERY Right    Carpel Tunnel   HYSTEROSCOPY WITH D & C N/A 11/06/2023   Procedure: DILATATION AND CURETTAGE /HYSTEROSCOPY;  Surgeon: Cleotilde Ronal RAMAN, MD;  Location: [facility] OR;  Service: Gynecology;  Laterality: N/A;   IR ANGIOGRAM SELECTIVE EACH ADDITIONAL VESSEL  01/02/2024   IR ANGIOGRAM SELECTIVE EACH ADDITIONAL VESSEL  01/02/2024   IR ANGIOGRAM SELECTIVE EACH ADDITIONAL VESSEL  01/02/2024   IR ANGIOGRAM VISCERAL SELECTIVE  01/02/2024   LAPAROSCOPIC UNILATERAL SALPINGO OOPHERECTOMY Left 11/06/2023   Procedure: SALPINGO-OOPHORECTOMY, BILATERAL, LAPAROSCOPIC;  Surgeon: Cleotilde Ronal RAMAN, MD;  Location: [facility] OR;  Service: Gynecology;  Laterality: Left;  laparoscopic left salpingoophrectomy, Possible right salpingoophrectomy,   LEFT HEART CATH AND CORONARY ANGIOGRAPHY N/A 08/31/2023   Procedure: LEFT HEART CATH AND CORONARY ANGIOGRAPHY;  Surgeon: Mady Bruckner, MD;  Location: [facility] INVASIVE CV LAB;  Service: Cardiovascular;  Laterality: N/A;   POLYPECTOMY     TUBAL LIGATION     Social History:   reports that she has quit smoking. She has never been exposed to tobacco smoke. She has never used smokeless tobacco. She reports current alcohol use. She reports that she does not use drugs.  Family History  Problem Relation Age of Onset   Colon cancer Mother    Stroke Father    Hypertension Father    Heart failure Maternal Grandmother    Diabetes Maternal Grandmother        ?   Rectal cancer Neg Hx    Stomach cancer Neg Hx    Esophageal cancer Neg Hx    Pancreatic cancer Neg Hx     Medications: Patient's Medications  New Prescriptions   No medications on file  Previous Medications   ACCU-CHEK FASTCLIX LANCETS MISC    USE AS DIRECTED.   ACETAMINOPHEN  (TYLENOL ) 650 MG CR TABLET    Take 1 tablet (650 mg total) by mouth every 8 (eight) hours as needed for pain.   ACETAMINOPHEN  (TYLENOL ) 650 MG CR TABLET    Take 650 mg by mouth daily. Every morning    ALPRAZOLAM  (XANAX ) 0.25 MG TABLET    TAKE ONE TABLET BY MOUTH TWICE DAILY AS NEEDED   AMLODIPINE  (NORVASC ) 10 MG TABLET    Take 1 tablet (10 mg total) by mouth daily.   ASPIRIN  EC 81 MG TABLET    Take 1 tablet (81 mg total) by mouth daily. Swallow whole.   CHOLECALCIFEROL (VITAMIN D3) 125 MCG (5000 UT) TABS    Take 5,000 Units by mouth daily.   CLOPIDOGREL  (PLAVIX ) 75 MG TABLET    Take 1 tablet (75 mg total) by mouth daily.   ESOMEPRAZOLE  (NEXIUM ) 40 MG CAPSULE    Take 1 capsule (40 mg total) by mouth daily.   FLUOXETINE  (PROZAC ) 40 MG CAPSULE    TAKE ONE CAPSULE BY MOUTH EVERY DAY.   GLIPIZIDE  (GLUCOTROL ) 5 MG TABLET    Take 1 tablet (5 mg total) by mouth 2 (two) times daily before a meal.   GLUCOSE BLOOD (ACCU-CHEK SMARTVIEW) TEST STRIP  CHECK BLOOD SUGAR 3 TIMES DAILY AS DIRECTED.   GLYCERIN-HYPROMELLOSE-PEG 400 (DRY EYE RELIEF DROPS OP)    Place 1 drop into both eyes daily as needed (Dry eye).   MULTIPLE VITAMINS-MINERALS (PRESERVISION AREDS PO)    Take 1 tablet by mouth in the morning and at bedtime.   OMEGA-3 FATTY ACIDS (FISH OIL) 1000 MG CAPS    Take 1,000 mg by mouth daily.   SUCRALFATE  (CARAFATE ) 1 G TABLET    Take 1 tablet (1 g total) by mouth 3 (three) times daily as needed.   TRIAMCINOLONE  CREAM (KENALOG ) 0.1 %    Apply 1 Application topically 2 (two) times daily. Apply 2 times daily to the back for up to 2 weeks then STOP, Resume in 2 weeks if irritation is not resolved  Modified Medications   No medications on file  Discontinued Medications   No medications on file    Physical Exam: Vitals:   01/12/24 0823  Pulse: 99  Temp: 98.2 F (36.8 C)  SpO2: 97%  Weight: 199 lb 12.8 oz (90.6 kg)  Height: 5' 1 (1.549 m)   Body mass index is 37.75 kg/m. BP Readings from Last 3 Encounters:  01/06/24 (!) 150/83  11/06/23 138/62  11/02/23 130/70   Wt Readings from Last 3 Encounters:  01/12/24 199 lb 12.8 oz (90.6 kg)  01/05/24 199 lb 11.8 oz (90.6 kg)  11/06/23 200 lb (90.7  kg)    Physical Exam Constitutional:      Appearance: Normal appearance.  HENT:     Head: Normocephalic and atraumatic.  Cardiovascular:     Rate and Rhythm: Normal rate and regular rhythm.  Pulmonary:     Effort: Pulmonary effort is normal. No respiratory distress.     Breath sounds: Normal breath sounds. No wheezing.  Abdominal:     General: Bowel sounds are normal. There is no distension.     Tenderness: There is no abdominal tenderness. There is no guarding or rebound.     Comments:    Musculoskeletal:        General: No swelling or tenderness.  Neurological:     Mental Status: She is alert. Mental status is at baseline.     Sensory: No sensory deficit.     Motor: No weakness.     Labs reviewed: Basic Metabolic Panel: Recent Labs    01/04/24 0325 01/05/24 0342 01/06/24 0200  NA 140 141 142  K 3.9 3.5 3.6  CL 110 109 107  CO2 22 22 25   GLUCOSE 129* 114* 117*  BUN 31* 19 17  CREATININE 1.31* 1.27* 1.29*  CALCIUM 8.2* 8.2* 8.5*  MG 1.9 1.9 1.9  PHOS 3.5 2.9 2.6   Liver Function Tests: Recent Labs    03/29/23 1105 01/02/24 0726 01/04/24 0325 01/05/24 0342 01/06/24 0200  AST 21 20  --   --   --   ALT 22 15  --   --   --   ALKPHOS 85 108  --   --   --   BILITOT 0.4 0.7  --   --   --   PROT 6.6 6.3*  --   --   --   ALBUMIN 4.3 3.2* 2.9* 2.6* 2.7*   Recent Labs    03/29/23 1105  LIPASE 48.0   No results for input(s): AMMONIA in the last 8760 hours. CBC: Recent Labs    03/29/23 1105 08/28/23 1622 01/02/24 0726 01/02/24 1104 01/05/24 0342 01/05/24 1517 01/06/24 0200  WBC 9.7   < >  10.4   < > 9.1 10.2 9.9  NEUTROABS 7.1  --  7.9*  --   --  6.8  --   HGB 12.8   < > 11.6*   < > 7.0* 8.2* 8.2*  HCT 39.6   < > 35.9*   < > 21.4* 24.7* 24.6*  MCV 96.5   < > 90.0   < > 91.1 90.5 90.1  PLT 305.0   < > 288   < > 202 214 212   < > = values in this interval not displayed.   Lipid Panel: No results for input(s): CHOL, HDL, LDLCALC, TRIG,  CHOLHDL, LDLDIRECT in the last 8760 hours. TSH: No results for input(s): TSH in the last 8760 hours. A1C: Lab Results  Component Value Date   HGBA1C 6.6 (H) 09/28/2023    Assessment and Plan Assessment & Plan  1. Gastrointestinal hemorrhage, unspecified gastrointestinal hemorrhage type (Primary) No further bleeding since discharge Will check labs - CBC (no diff) - Basic Metabolic Panel  2. Coronary artery disease involving native coronary artery of native heart without angina pectoris Denis chest pain, sob Follow up with cardiology  3. Chronic bilateral low back pain without sciatica No red flag signs  Take tylenol  prn  Use lidocaine  patches  4. Iron deficiency anemia, unspecified iron deficiency anemia type Cont with iron supplements - CBC (no diff) - Basic Metabolic Panel  5. Stage 3a chronic kidney disease (HCC) Avoid nephrotoxic meds Increase oral hydration  Other orders - acetaminophen  (TYLENOL ) 650 MG CR tablet; Take 650 mg by mouth daily. Every morning

## 2024-01-12 NOTE — Patient Instructions (Addendum)
 Visit local pharmacy to receive flu and covid vaccine.   Please come for lab work this coming Tuesday at 7.30 am

## 2024-01-16 ENCOUNTER — Ambulatory Visit: Payer: Self-pay | Admitting: Sports Medicine

## 2024-01-16 DIAGNOSIS — D509 Iron deficiency anemia, unspecified: Secondary | ICD-10-CM | POA: Diagnosis not present

## 2024-01-16 DIAGNOSIS — K922 Gastrointestinal hemorrhage, unspecified: Secondary | ICD-10-CM | POA: Diagnosis not present

## 2024-01-16 LAB — BASIC METABOLIC PANEL WITH GFR
BUN/Creatinine Ratio: 24 (calc) — ABNORMAL HIGH (ref 6–22)
BUN: 29 mg/dL — ABNORMAL HIGH (ref 7–25)
CO2: 26 mmol/L (ref 20–32)
Calcium: 8.8 mg/dL (ref 8.6–10.4)
Chloride: 102 mmol/L (ref 98–110)
Creat: 1.22 mg/dL — ABNORMAL HIGH (ref 0.60–0.95)
Glucose, Bld: 184 mg/dL — ABNORMAL HIGH (ref 65–99)
Potassium: 4.5 mmol/L (ref 3.5–5.3)
Sodium: 137 mmol/L (ref 135–146)
eGFR: 43 mL/min/1.73m2 — ABNORMAL LOW (ref >=60)

## 2024-01-16 LAB — CBC
HCT: 29.9 % — ABNORMAL LOW (ref 35.0–45.0)
Hemoglobin: 9.6 g/dL — ABNORMAL LOW (ref 11.7–15.5)
MCH: 30 pg (ref 27.0–33.0)
MCHC: 32.1 g/dL (ref 32.0–36.0)
MCV: 93.4 fL (ref 80.0–100.0)
MPV: 10 fL (ref 7.5–12.5)
Platelets: 340 Thousand/uL (ref 140–400)
RBC: 3.2 Million/uL — ABNORMAL LOW (ref 3.80–5.10)
RDW: 14.4 % (ref 11.0–15.0)
WBC: 7.7 Thousand/uL (ref 3.8–10.8)

## 2024-01-26 DIAGNOSIS — M5416 Radiculopathy, lumbar region: Secondary | ICD-10-CM | POA: Diagnosis not present

## 2024-01-26 DIAGNOSIS — M6281 Muscle weakness (generalized): Secondary | ICD-10-CM | POA: Diagnosis not present

## 2024-01-26 DIAGNOSIS — R2689 Other abnormalities of gait and mobility: Secondary | ICD-10-CM | POA: Diagnosis not present

## 2024-01-26 DIAGNOSIS — R2681 Unsteadiness on feet: Secondary | ICD-10-CM | POA: Diagnosis not present

## 2024-01-26 DIAGNOSIS — I70213 Atherosclerosis of native arteries of extremities with intermittent claudication, bilateral legs: Secondary | ICD-10-CM | POA: Diagnosis not present

## 2024-01-30 DIAGNOSIS — M5416 Radiculopathy, lumbar region: Secondary | ICD-10-CM | POA: Diagnosis not present

## 2024-01-30 DIAGNOSIS — M6281 Muscle weakness (generalized): Secondary | ICD-10-CM | POA: Diagnosis not present

## 2024-01-30 DIAGNOSIS — I70213 Atherosclerosis of native arteries of extremities with intermittent claudication, bilateral legs: Secondary | ICD-10-CM | POA: Diagnosis not present

## 2024-01-30 DIAGNOSIS — R2689 Other abnormalities of gait and mobility: Secondary | ICD-10-CM | POA: Diagnosis not present

## 2024-01-30 DIAGNOSIS — R2681 Unsteadiness on feet: Secondary | ICD-10-CM | POA: Diagnosis not present

## 2024-02-01 DIAGNOSIS — M6281 Muscle weakness (generalized): Secondary | ICD-10-CM | POA: Diagnosis not present

## 2024-02-01 DIAGNOSIS — I70213 Atherosclerosis of native arteries of extremities with intermittent claudication, bilateral legs: Secondary | ICD-10-CM | POA: Diagnosis not present

## 2024-02-01 DIAGNOSIS — R2689 Other abnormalities of gait and mobility: Secondary | ICD-10-CM | POA: Diagnosis not present

## 2024-02-01 DIAGNOSIS — M5416 Radiculopathy, lumbar region: Secondary | ICD-10-CM | POA: Diagnosis not present

## 2024-02-01 DIAGNOSIS — R2681 Unsteadiness on feet: Secondary | ICD-10-CM | POA: Diagnosis not present

## 2024-02-06 DIAGNOSIS — M5416 Radiculopathy, lumbar region: Secondary | ICD-10-CM | POA: Diagnosis not present

## 2024-02-06 DIAGNOSIS — R2689 Other abnormalities of gait and mobility: Secondary | ICD-10-CM | POA: Diagnosis not present

## 2024-02-06 DIAGNOSIS — I70213 Atherosclerosis of native arteries of extremities with intermittent claudication, bilateral legs: Secondary | ICD-10-CM | POA: Diagnosis not present

## 2024-02-06 DIAGNOSIS — R2681 Unsteadiness on feet: Secondary | ICD-10-CM | POA: Diagnosis not present

## 2024-02-06 DIAGNOSIS — M6281 Muscle weakness (generalized): Secondary | ICD-10-CM | POA: Diagnosis not present

## 2024-02-08 DIAGNOSIS — R2689 Other abnormalities of gait and mobility: Secondary | ICD-10-CM | POA: Diagnosis not present

## 2024-02-08 DIAGNOSIS — M6281 Muscle weakness (generalized): Secondary | ICD-10-CM | POA: Diagnosis not present

## 2024-02-08 DIAGNOSIS — I70213 Atherosclerosis of native arteries of extremities with intermittent claudication, bilateral legs: Secondary | ICD-10-CM | POA: Diagnosis not present

## 2024-02-08 DIAGNOSIS — R2681 Unsteadiness on feet: Secondary | ICD-10-CM | POA: Diagnosis not present

## 2024-02-08 DIAGNOSIS — M5416 Radiculopathy, lumbar region: Secondary | ICD-10-CM | POA: Diagnosis not present

## 2024-02-10 NOTE — Progress Notes (Unsigned)
 Cardiology Office Note:    Date:  02/13/2024   ID:  Brittany Mcclure, DOB 08/01/1940, MRN [id number]  PCP:  Mast, Man X, NP   [facility] Providers Cardiologist:  Lurena MARLA Red, MD Cardiology APP:  Madie Jon Garre, PA     Referring MD: Mast, Man X, NP   Chief Complaint  Patient presents with   Follow-up    CAD    History of Present Illness:    Brittany Mcclure is a 83 y.o. female with a hx of nonobstructive CAD, HLD with statin intolerance, DM2, HTN, HLD, aortic atherosclerosis, CKD 3a, and obesity.   Chest pain lead to abnormal CTA coronary which prompted definitive angiography.  LHC on 08/31/23 which revealed 75% stenosis in the mid RCA that was not hemodynamically significant (RFR 0.92). She was started on plavix , but this can be held for procedures.   She was admitted 12/2023 with acute GI bleed requiring transfusion.  She was referred to IR for embolization on 01/02/2024 but no extravasation on IMA angiogram.  Colonoscopy on 01/04/2024 with severe diverticulosis.  Plavix  was held until 01/07/2024.  She is now on aspirin  monotherapy.  Given recent GI bleeding and no PCI on heart cath in June 2025, we will continue with aspirin  monotherapy rather than DAPT.  She presents today for cardiology follow-up. She is having a lot of sicatica pain, in PT now. She stopped taking both ASA due to bruising. She is not currently taking ASA or plavix .  She is concerned about bilateral lower extremity pan, mostly numbness and tingling. She does not think this is related to her sciatica pain because it is bilateral and not just on the left leg.    Past Medical History:  Diagnosis Date   Allergy    Anxiety    Arthritis    with gout   Carotid bruit    right   Cataract    bil cataracts removed   Chest pain    Chronic kidney disease    Colon polyps    Depression    Diverticulosis    Duodenitis    Exogenous obesity    Gastric ulcer    GERD (gastroesophageal reflux  disease)    Hyperlipidemia    Hypertension    Metabolic syndrome    Osteopenia    Seasonal allergies    seasonal   Tubular adenoma of colon    Type 2 diabetes mellitus (HCC)     Past Surgical History:  Procedure Laterality Date   COLONOSCOPY     11/90,11/91,1994,2000,2010   COLONOSCOPY N/A 01/04/2024   Procedure: COLONOSCOPY;  Surgeon: Shila Gustav GAILS, MD;  Location: [facility];  Service: Gastroenterology;  Laterality: N/A;   CORONARY PRESSURE/FFR STUDY N/A 08/31/2023   Procedure: CORONARY PRESSURE/FFR STUDY;  Surgeon: Mady Bruckner, MD;  Location: [facility];  Service: Cardiovascular;  Laterality: N/A;   DILATION AND CURETTAGE OF UTERUS  1997   FOOT SURGERY     x 2   HAND SURGERY Right    Carpel Tunnel   HYSTEROSCOPY WITH D & C N/A 11/06/2023   Procedure: DILATATION AND CURETTAGE /HYSTEROSCOPY;  Surgeon: Cleotilde Ronal RAMAN, MD;  Location: [facility] OR;  Service: Gynecology;  Laterality: N/A;   IR ANGIOGRAM SELECTIVE EACH ADDITIONAL VESSEL  01/02/2024   IR ANGIOGRAM SELECTIVE EACH ADDITIONAL VESSEL  01/02/2024   IR ANGIOGRAM SELECTIVE EACH ADDITIONAL VESSEL  01/02/2024   IR ANGIOGRAM VISCERAL SELECTIVE  01/02/2024   LAPAROSCOPIC UNILATERAL SALPINGO OOPHERECTOMY Left 11/06/2023  Procedure: SALPINGO-OOPHORECTOMY, BILATERAL, LAPAROSCOPIC;  Surgeon: Cleotilde Ronal RAMAN, MD;  Location: [facility] OR;  Service: Gynecology;  Laterality: Left;  laparoscopic left salpingoophrectomy, Possible right salpingoophrectomy,   LEFT HEART CATH AND CORONARY ANGIOGRAPHY N/A 08/31/2023   Procedure: LEFT HEART CATH AND CORONARY ANGIOGRAPHY;  Surgeon: Mady Bruckner, MD;  Location: [facility];  Service: Cardiovascular;  Laterality: N/A;   POLYPECTOMY     TUBAL LIGATION      Current Medications: Current Meds  Medication Sig   Accu-Chek FastClix Lancets MISC USE AS DIRECTED.   acetaminophen  (TYLENOL ) 650 MG CR tablet Take 1 tablet (650 mg total) by mouth every 8 (eight) hours as needed for pain.    acetaminophen  (TYLENOL ) 650 MG CR tablet Take 650 mg by mouth daily. Every morning   ALPRAZolam  (XANAX ) 0.25 MG tablet TAKE ONE TABLET BY MOUTH TWICE DAILY AS NEEDED   amLODipine  (NORVASC ) 10 MG tablet Take 1 tablet (10 mg total) by mouth daily.   aspirin  EC 81 MG tablet Take 1 tablet (81 mg total) by mouth daily. Swallow whole.   Cholecalciferol  (VITAMIN D3) 125 MCG (5000 UT) TABS Take 5,000 Units by mouth daily.   esomeprazole  (NEXIUM ) 40 MG capsule Take 1 capsule (40 mg total) by mouth daily.   FLUoxetine  (PROZAC ) 40 MG capsule TAKE ONE CAPSULE BY MOUTH EVERY DAY.   glipiZIDE  (GLUCOTROL ) 5 MG tablet Take 1 tablet (5 mg total) by mouth 2 (two) times daily before a meal.   glucose blood (ACCU-CHEK SMARTVIEW) test strip CHECK BLOOD SUGAR 3 TIMES DAILY AS DIRECTED.   Glycerin-Hypromellose-PEG 400 (DRY EYE RELIEF DROPS OP) Place 1 drop into both eyes daily as needed (Dry eye).   Multiple Vitamins-Minerals (PRESERVISION AREDS PO) Take 1 tablet by mouth in the morning and at bedtime.   Omega-3 Fatty Acids (FISH OIL) 1000 MG CAPS Take 1,000 mg by mouth daily.   sucralfate  (CARAFATE ) 1 g tablet Take 1 tablet (1 g total) by mouth 3 (three) times daily as needed.   telmisartan  (MICARDIS ) 20 MG tablet Take 1 tablet (20 mg total) by mouth daily.   triamcinolone  cream (KENALOG ) 0.1 % Apply 1 Application topically 2 (two) times daily. Apply 2 times daily to the back for up to 2 weeks then STOP, Resume in 2 weeks if irritation is not resolved   [DISCONTINUED] telmisartan  (MICARDIS ) 40 MG tablet Take 40 mg by mouth daily.     Allergies:   Statins, Sulfa antibiotics, and Zetia  [ezetimibe ]   Social History   Socioeconomic History   Marital status: Widowed    Spouse name: Not on file   Number of children: 2   Years of education: Not on file   Highest education level: Not on file  Occupational History   Occupation: retired    Associate Professor: RETIRED  Tobacco Use   Smoking status: Former    Passive  exposure: Never   Smokeless tobacco: Never   Tobacco comments:    Quit Late 20's or early 30's  Vaping Use   Vaping status: Never Used  Substance and Sexual Activity   Alcohol use: Yes    Alcohol/week: 0.0 standard drinks of alcohol    Comment: socially   Drug use: No   Sexual activity: Not on file  Other Topics Concern   Not on file  Social History Narrative   Tobacco use, amount per day now: None      Past tobacco use, amount per day: in her late 16s a pack per day  How many years did you use tobacco: 10      Alcohol use (drinks per week): occasional wine      Diet:      Do you drink/eat things with caffeine? Tea, soft drinks and coffee      Marital status: Married             What year were you married? 1962      Do you live in a house, apartment, assisted living, condo, trailer? Apartment      Is it one or more stories? Yes      How many persons live in your home? 0      Do you have any pets in your home? 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 Yes            How often? Water aerobics       Do you have a living will? Yes      Do you have a DNR form? Yes           If not, do you want to discuss one?      Do you have signed POA/HPOA forms?  Yes            Social Drivers of Corporate Investment Banker Strain: Low Risk  (02/21/2017)   Overall Financial Resource Strain (CARDIA)    Difficulty of Paying Living Expenses: Not hard at all  Food Insecurity: No Food Insecurity (01/02/2024)   Hunger Vital Sign    Worried About Running Out of Food in the Last Year: Never true    Ran Out of Food in the Last Year: Never true  Transportation Needs: No Transportation Needs (01/02/2024)   PRAPARE - Administrator, Civil Service (Medical): No    Lack of Transportation (Non-Medical): No  Physical Activity: Insufficiently Active (02/21/2017)   Exercise Vital Sign    Days of Exercise per Week: 3 days    Minutes of Exercise  per Session: 30 min  Stress: No Stress Concern Present (02/21/2017)   [employer] of Occupational Health - Occupational Stress Questionnaire    Feeling of Stress : Only a little  Social Connections: Moderately Isolated (01/02/2024)   Social Connection and Isolation Panel    Frequency of Communication with Friends and Family: Once a week    Frequency of Social Gatherings with Friends and Family: More than three times a week    Attends Religious Services: 1 to 4 times per year    Active Member of [employer] or Organizations: No    Attends Banker Meetings: Never    Marital Status: Widowed     Family History: The patient's family history includes Colon cancer in her mother; Diabetes in her maternal grandmother; Heart failure in her maternal grandmother; Hypertension in her father; Stroke in her father. There is no history of Rectal cancer, Stomach cancer, Esophageal cancer, or Pancreatic cancer.  ROS:   Please see the history of present illness.     All other systems reviewed and are negative.  EKGs/Labs/Other Studies Reviewed:    The following studies were reviewed today:       Recent Labs: 01/02/2024: ALT 15 01/06/2024: Magnesium  1.9 01/16/2024: BUN 29; Creat 1.22; Hemoglobin 9.6; Platelets 340; Potassium 4.5; Sodium 137  Recent Lipid Panel    Component Value Date/Time   CHOL 321 (H) 04/12/2022 0703   TRIG 381 (H) 04/12/2022  0703   HDL 47 (L) 04/12/2022 0703   CHOLHDL 6.8 (H) 04/12/2022 0703   VLDL 42 (H) 11/22/2016 0740   LDLCALC 209 (H) 04/12/2022 0703   LDLDIRECT 136.7 02/15/2013 1046     Risk Assessment/Calculations:      HYPERTENSION CONTROL Vitals:   02/13/24 0926 02/13/24 1000  BP: (!) 166/84 (!) 150/80    The patient's blood pressure is elevated above target today.  In order to address the patient's elevated BP: A new medication was prescribed today.            Physical Exam:    VS:  BP (!) 150/80 (BP Location: Left Arm, Cuff Size:  Large)   Pulse 84   Ht 5' 1 (1.549 m)   Wt 202 lb (91.6 kg)   SpO2 95%   BMI 38.17 kg/m     Wt Readings from Last 3 Encounters:  02/13/24 202 lb (91.6 kg)  01/12/24 199 lb 12.8 oz (90.6 kg)  01/05/24 199 lb 11.8 oz (90.6 kg)     GEN:  Well nourished, well developed in no acute distress HEENT: Normal NECK: No JVD; No carotid bruits LYMPHATICS: No lymphadenopathy CARDIAC: RRR, no murmurs, rubs, gallops RESPIRATORY:  Clear to auscultation without rales, wheezing or rhonchi  ABDOMEN: Soft, non-tender, non-distended MUSCULOSKELETAL:  No edema; No deformity  SKIN: Warm and dry NEUROLOGIC:  Alert and oriented x 3 PSYCHIATRIC:  Normal affect   ASSESSMENT:    1. Lower extremity edema   2. Pain in both lower extremities   3. Coronary artery disease involving native coronary artery of native heart without angina pectoris   4. Hyperlipidemia with target LDL less than 70   5. Primary hypertension   6. Type 2 diabetes mellitus in patient with obesity (HCC)    PLAN:    In order of problems listed above:  CAD - RFR negative mid RCA stenosis, 30-40% mid LAD stenosis - ASA monotherapy - off ASA for 2 weeks,  no further bleeding - would like to restart ASA if she agrees.   HTN - Amlodipine  was increased to 10 mg during recent hospitalization -- 40 mg telmisartan  was discontinued at last hospitalization -- BP elevated today -- will restart 20 mg telmisartan  with BP log, if SBP consistently above 140, may go to a 40 mg telmisartan  - given DM, would prefer to restart at least a low does of ARB - BMP when she comes for ABIs   Hyperlipidemia with LDL goal < 70 - need updated lipid panel - statin intolerant - lipid clinic referral for [id number] discussed   Lower extremity pain - unclear if with exertion, she states it is intermittent - bilateral, she feels is not related to her sciatica - will obtain LE ABI with reflex   DM - A1c 6.6% - followed by PCP   Follow up with me  in 1 month.          Medication Adjustments/Labs and Tests Ordered: Current medicines are reviewed at length with the patient today.  Concerns regarding medicines are outlined above.  Orders Placed This Encounter  Procedures   Basic metabolic panel with GFR   VAS US  ABI WITH/WO TBI   Meds ordered this encounter  Medications   telmisartan  (MICARDIS ) 20 MG tablet    Sig: Take 1 tablet (20 mg total) by mouth daily.    Dispense:  90 tablet    Refill:  3    Patient Instructions  Medication Instructions:  Restart Asprin 81  mg daily Telmisartan  20 mg daily in the mornings  *If you need a refill on your cardiac medications before your next appointment, please call your pharmacy*  Lab Work: BMET- please get lab work the morning of your next appointment with Jon Hails PA.   Testing/Procedures: Your physician has requested that you have an ankle brachial index (ABI). During this test an ultrasound and blood pressure cuff are used to evaluate the arteries that supply the arms and legs with blood. Allow thirty minutes for this exam. There are no restrictions or special instructions.  Please note: We ask at that you not bring children with you during ultrasound (echo/ vascular) testing. Due to room size and safety concerns, children are not allowed in the ultrasound rooms during exams. Our front office staff cannot provide observation of children in our lobby area while testing is being conducted. An adult accompanying a patient to their appointment will only be allowed in the ultrasound room at the discretion of the ultrasound technician under special circumstances. We apologize for any inconvenience.   Follow-Up: At [facility], you and your health needs are our priority.  As part of our continuing mission to provide you with exceptional heart care, our providers are all part of one team.  This team includes your primary Cardiologist (physician) and Advanced Practice Providers or  APPs (Physician Assistants and Nurse Practitioners) who all work together to provide you with the care you need, when you need it.  Your next appointment:   1 month(s)  Provider:   Jon Hails, PA-C          We recommend signing up for the patient portal called MyChart.  Sign up information is provided on this After Visit Summary.  MyChart is used to connect with patients for Virtual Visits (Telemedicine).  Patients are able to view lab/test results, encounter notes, upcoming appointments, etc.  Non-urgent messages can be sent to your provider as well.   To learn more about what you can do with MyChart, go to [email or web address].   Other Instructions Monitor Blood pressure. Take blood pressure daily 2hours after you've taking your blood pressure medications.  Please contact our office if your blood pressure is consistently greater than 140/80.            Signed, Jon Nat Hails, [place]  02/13/2024 10:43 AM    [facility]

## 2024-02-12 DIAGNOSIS — I70213 Atherosclerosis of native arteries of extremities with intermittent claudication, bilateral legs: Secondary | ICD-10-CM | POA: Diagnosis not present

## 2024-02-12 DIAGNOSIS — R2681 Unsteadiness on feet: Secondary | ICD-10-CM | POA: Diagnosis not present

## 2024-02-12 DIAGNOSIS — M6281 Muscle weakness (generalized): Secondary | ICD-10-CM | POA: Diagnosis not present

## 2024-02-12 DIAGNOSIS — R2689 Other abnormalities of gait and mobility: Secondary | ICD-10-CM | POA: Diagnosis not present

## 2024-02-12 DIAGNOSIS — M5416 Radiculopathy, lumbar region: Secondary | ICD-10-CM | POA: Diagnosis not present

## 2024-02-13 ENCOUNTER — Encounter: Payer: Self-pay | Admitting: Physician Assistant

## 2024-02-13 ENCOUNTER — Ambulatory Visit: Attending: Physician Assistant | Admitting: Physician Assistant

## 2024-02-13 VITALS — BP 150/80 | HR 84 | Ht 61.0 in | Wt 202.0 lb

## 2024-02-13 DIAGNOSIS — M79605 Pain in left leg: Secondary | ICD-10-CM

## 2024-02-13 DIAGNOSIS — I10 Essential (primary) hypertension: Secondary | ICD-10-CM

## 2024-02-13 DIAGNOSIS — E785 Hyperlipidemia, unspecified: Secondary | ICD-10-CM

## 2024-02-13 DIAGNOSIS — I2510 Atherosclerotic heart disease of native coronary artery without angina pectoris: Secondary | ICD-10-CM | POA: Diagnosis not present

## 2024-02-13 DIAGNOSIS — E119 Type 2 diabetes mellitus without complications: Secondary | ICD-10-CM | POA: Diagnosis not present

## 2024-02-13 DIAGNOSIS — M79604 Pain in right leg: Secondary | ICD-10-CM | POA: Diagnosis not present

## 2024-02-13 DIAGNOSIS — R600 Localized edema: Secondary | ICD-10-CM | POA: Diagnosis not present

## 2024-02-13 DIAGNOSIS — E669 Obesity, unspecified: Secondary | ICD-10-CM | POA: Diagnosis not present

## 2024-02-13 MED ORDER — TELMISARTAN 20 MG PO TABS: 20.0000 mg | ORAL_TABLET | Freq: Every day | ORAL | 3 refills | Status: AC

## 2024-02-13 NOTE — Patient Instructions (Addendum)
 Medication Instructions:  Restart Asprin 81 mg daily Telmisartan  20 mg daily in the mornings  *If you need a refill on your cardiac medications before your next appointment, please call your pharmacy*  Lab Work: BMET- please get lab work the morning of your next appointment with Jon Hails PA.   Testing/Procedures: Your physician has requested that you have an ankle brachial index (ABI). During this test an ultrasound and blood pressure cuff are used to evaluate the arteries that supply the arms and legs with blood. Allow thirty minutes for this exam. There are no restrictions or special instructions.  Please note: We ask at that you not bring children with you during ultrasound (echo/ vascular) testing. Due to room size and safety concerns, children are not allowed in the ultrasound rooms during exams. Our front office staff cannot provide observation of children in our lobby area while testing is being conducted. An adult accompanying a patient to their appointment will only be allowed in the ultrasound room at the discretion of the ultrasound technician under special circumstances. We apologize for any inconvenience.   Follow-Up: At [facility], you and your health needs are our priority.  As part of our continuing mission to provide you with exceptional heart care, our providers are all part of one team.  This team includes your primary Cardiologist (physician) and Advanced Practice Providers or APPs (Physician Assistants and Nurse Practitioners) who all work together to provide you with the care you need, when you need it.  Your next appointment:   1 month(s)  Provider:   Jon Hails, PA-C          We recommend signing up for the patient portal called MyChart.  Sign up information is provided on this After Visit Summary.  MyChart is used to connect with patients for Virtual Visits (Telemedicine).  Patients are able to view lab/test results, encounter notes, upcoming  appointments, etc.  Non-urgent messages can be sent to your provider as well.   To learn more about what you can do with MyChart, go to [email or web address].   Other Instructions Monitor Blood pressure. Take blood pressure daily 2hours after you've taking your blood pressure medications.  Please contact our office if your blood pressure is consistently greater than 140/80.

## 2024-02-14 ENCOUNTER — Encounter: Payer: Self-pay | Admitting: Internal Medicine

## 2024-02-14 ENCOUNTER — Non-Acute Institutional Stay: Payer: Self-pay | Admitting: Internal Medicine

## 2024-02-14 VITALS — BP 128/68 | HR 92 | Temp 98.0°F | Resp 18 | Ht 61.0 in | Wt 201.8 lb

## 2024-02-14 DIAGNOSIS — M5442 Lumbago with sciatica, left side: Secondary | ICD-10-CM

## 2024-02-14 DIAGNOSIS — R2689 Other abnormalities of gait and mobility: Secondary | ICD-10-CM | POA: Diagnosis not present

## 2024-02-14 DIAGNOSIS — M6281 Muscle weakness (generalized): Secondary | ICD-10-CM | POA: Diagnosis not present

## 2024-02-14 DIAGNOSIS — G8929 Other chronic pain: Secondary | ICD-10-CM | POA: Diagnosis not present

## 2024-02-14 DIAGNOSIS — I70213 Atherosclerosis of native arteries of extremities with intermittent claudication, bilateral legs: Secondary | ICD-10-CM | POA: Diagnosis not present

## 2024-02-14 DIAGNOSIS — M5416 Radiculopathy, lumbar region: Secondary | ICD-10-CM | POA: Diagnosis not present

## 2024-02-14 DIAGNOSIS — R2681 Unsteadiness on feet: Secondary | ICD-10-CM | POA: Diagnosis not present

## 2024-02-14 MED ORDER — METHOCARBAMOL 500 MG PO TABS: 250.0000 mg | ORAL_TABLET | Freq: Three times a day (TID) | ORAL | 0 refills | Status: AC | PRN

## 2024-02-14 MED ORDER — PREDNISONE 20 MG PO TABS: 20.0000 mg | ORAL_TABLET | Freq: Every day | ORAL | 0 refills | Status: DC

## 2024-02-14 NOTE — Progress Notes (Signed)
 Location: [facility] Teacher of Service:  Clinic (12)  Provider:   Code Status:  Goals of Care:     01/06/2024   10:23 AM  Advanced Directives  Does Patient Have a Medical Advance Directive? No  Would patient like information on creating a medical advance directive? No - Patient declined     Chief Complaint  Patient presents with   Medical Management of Chronic Issues    [facility] resident being seen at the [facility] location for a 4 week follow-up    HPI: Patient is a 83 y.o. female seen today for an acute visit for Back Pain Radiating going down to her Legs  Patient lives is [facility] She was recently in the hospital for GI bleed due to acute diverticular bleeding. She also has a history of coronary artery disease, type 2 diabetes, and GERD with history of gastric ulcer, hypertension.  Patient has a history of back pain.  She said that she had seen neurosurgery in the past and had it injected her and has not had any issue.  At last few months patient has noticed pain in her back.  It has now started radiating down her left leg. Patient called our office and got approved for therapy.  She is having hard time doing therapy due to pain.  She did use a heating pad which has helped her pain. She has also started using Tylenol  to help her pain and also walker  Past Medical History:  Diagnosis Date   Allergy    Anxiety    Arthritis    with gout   Carotid bruit    right   Cataract    bil cataracts removed   Chest pain    Chronic kidney disease    Colon polyps    Depression    Diverticulosis    Duodenitis    Exogenous obesity    Gastric ulcer    GERD (gastroesophageal reflux disease)    Hyperlipidemia    Hypertension    Metabolic syndrome    Osteopenia    Seasonal allergies    seasonal   Tubular adenoma of colon    Type 2 diabetes mellitus (HCC)     Past Surgical History:  Procedure Laterality Date   COLONOSCOPY     11/90,11/91,1994,2000,2010   COLONOSCOPY N/A  01/04/2024   Procedure: COLONOSCOPY;  Surgeon: Shila Gustav GAILS, MD;  Location: [facility];  Service: Gastroenterology;  Laterality: N/A;   CORONARY PRESSURE/FFR STUDY N/A 08/31/2023   Procedure: CORONARY PRESSURE/FFR STUDY;  Surgeon: Mady Bruckner, MD;  Location: [facility] INVASIVE CV LAB;  Service: Cardiovascular;  Laterality: N/A;   DILATION AND CURETTAGE OF UTERUS  1997   FOOT SURGERY     x 2   HAND SURGERY Right    Carpel Tunnel   HYSTEROSCOPY WITH D & C N/A 11/06/2023   Procedure: DILATATION AND CURETTAGE /HYSTEROSCOPY;  Surgeon: Cleotilde Ronal RAMAN, MD;  Location: [facility] OR;  Service: Gynecology;  Laterality: N/A;   IR ANGIOGRAM SELECTIVE EACH ADDITIONAL VESSEL  01/02/2024   IR ANGIOGRAM SELECTIVE EACH ADDITIONAL VESSEL  01/02/2024   IR ANGIOGRAM SELECTIVE EACH ADDITIONAL VESSEL  01/02/2024   IR ANGIOGRAM VISCERAL SELECTIVE  01/02/2024   LAPAROSCOPIC UNILATERAL SALPINGO OOPHERECTOMY Left 11/06/2023   Procedure: SALPINGO-OOPHORECTOMY, BILATERAL, LAPAROSCOPIC;  Surgeon: Cleotilde Ronal RAMAN, MD;  Location: [facility] OR;  Service: Gynecology;  Laterality: Left;  laparoscopic left salpingoophrectomy, Possible right salpingoophrectomy,   LEFT HEART CATH AND CORONARY ANGIOGRAPHY N/A 08/31/2023  Procedure: LEFT HEART CATH AND CORONARY ANGIOGRAPHY;  Surgeon: Mady Bruckner, MD;  Location: [facility] CV [facility];  Service: Cardiovascular;  Laterality: N/A;   POLYPECTOMY     TUBAL LIGATION      Allergies  Allergen Reactions   Statins Other (See Comments)    Muscle Pain and swollen   Sulfa Antibiotics Nausea And Vomiting    Made infection worse    Zetia  [Ezetimibe ]     Cough, dry    Outpatient Encounter Medications as of 02/14/2024  Medication Sig   Accu-Chek FastClix Lancets MISC USE AS DIRECTED.   acetaminophen  (TYLENOL ) 650 MG CR tablet Take 1 tablet (650 mg total) by mouth every 8 (eight) hours as needed for pain.   acetaminophen  (TYLENOL ) 650 MG CR tablet Take 650 mg by mouth daily. Every morning    ALPRAZolam  (XANAX ) 0.25 MG tablet TAKE ONE TABLET BY MOUTH TWICE DAILY AS NEEDED   amLODipine  (NORVASC ) 10 MG tablet Take 1 tablet (10 mg total) by mouth daily.   aspirin  EC 81 MG tablet Take 1 tablet (81 mg total) by mouth daily. Swallow whole.   Cholecalciferol  (VITAMIN D3) 125 MCG (5000 UT) TABS Take 5,000 Units by mouth daily.   esomeprazole  (NEXIUM ) 40 MG capsule Take 1 capsule (40 mg total) by mouth daily.   FLUoxetine  (PROZAC ) 40 MG capsule TAKE ONE CAPSULE BY MOUTH EVERY DAY.   glipiZIDE  (GLUCOTROL ) 5 MG tablet Take 1 tablet (5 mg total) by mouth 2 (two) times daily before a meal.   glucose blood (ACCU-CHEK SMARTVIEW) test strip CHECK BLOOD SUGAR 3 TIMES DAILY AS DIRECTED.   Glycerin-Hypromellose-PEG 400 (DRY EYE RELIEF DROPS OP) Place 1 drop into both eyes daily as needed (Dry eye).   Multiple Vitamins-Minerals (PRESERVISION AREDS PO) Take 1 tablet by mouth in the morning and at bedtime.   Omega-3 Fatty Acids (FISH OIL) 1000 MG CAPS Take 1,000 mg by mouth daily.   sucralfate  (CARAFATE ) 1 g tablet Take 1 tablet (1 g total) by mouth 3 (three) times daily as needed.   telmisartan  (MICARDIS ) 20 MG tablet Take 1 tablet (20 mg total) by mouth daily.   triamcinolone  cream (KENALOG ) 0.1 % Apply 1 Application topically 2 (two) times daily. Apply 2 times daily to the back for up to 2 weeks then STOP, Resume in 2 weeks if irritation is not resolved   clopidogrel  (PLAVIX ) 75 MG tablet Take 1 tablet (75 mg total) by mouth daily. (Patient not taking: Reported on 02/14/2024)   No facility-administered encounter medications on file as of 02/14/2024.    Review of Systems:  Review of Systems  Constitutional:  Negative for activity change and appetite change.  HENT: Negative.    Respiratory:  Negative for cough and shortness of breath.   Cardiovascular:  Negative for leg swelling.  Gastrointestinal:  Negative for constipation.  Genitourinary: Negative.   Musculoskeletal:  Positive for back pain and  gait problem. Negative for arthralgias and myalgias.  Skin: Negative.   Neurological:  Negative for dizziness and weakness.  Psychiatric/Behavioral:  Negative for confusion, dysphoric mood and sleep disturbance.     Health Maintenance  Topic Date Due   Medicare Annual Wellness (AWV)  04/22/2023   Influenza Vaccine  10/20/2023   COVID-19 Vaccine (8 - 2025-26 season) 11/20/2023   HEMOGLOBIN A1C  03/30/2024   Mammogram  05/30/2024   FOOT EXAM  07/05/2024   Diabetic kidney evaluation - Urine ACR  09/27/2024   Diabetic kidney evaluation - eGFR measurement  01/15/2025  DTaP/Tdap/Td (2 - Td or Tdap) 03/02/2025   Bone Density Scan  Completed   Zoster Vaccines- Shingrix  Completed   Meningococcal B Vaccine  Aged Out   Pneumococcal Vaccine: 50+ Years  Discontinued   OPHTHALMOLOGY EXAM  Discontinued    Physical Exam: Vitals:   02/14/24 0903  BP: 128/68  Pulse: 92  Resp: 18  Temp: 98 F (36.7 C)  SpO2: 98%  Weight: 201 lb 12.8 oz (91.5 kg)  Height: 5' 1 (1.549 m)   Body mass index is 38.13 kg/m. Physical Exam Vitals reviewed.  Constitutional:      Appearance: Normal appearance.  HENT:     Head: Normocephalic.     Nose: Nose normal.     Mouth/Throat:     Mouth: Mucous membranes are moist.     Pharynx: Oropharynx is clear.  Eyes:     Pupils: Pupils are equal, round, and reactive to light.  Cardiovascular:     Rate and Rhythm: Normal rate and regular rhythm.     Pulses: Normal pulses.     Heart sounds: Normal heart sounds. No murmur heard. Pulmonary:     Effort: Pulmonary effort is normal.     Breath sounds: Normal breath sounds.  Abdominal:     General: Abdomen is flat. Bowel sounds are normal.     Palpations: Abdomen is soft.  Musculoskeletal:     Cervical back: Neck supple.     Comments: Straight Leg Rising test is positive on left side Also Point tenderness in her Lower Lumbar area in Paraspinal area   Skin:    General: Skin is warm.  Neurological:      General: No focal deficit present.     Mental Status: She is alert and oriented to person, place, and time.  Psychiatric:        Mood and Affect: Mood normal.        Thought Content: Thought content normal.     Labs reviewed: Basic Metabolic Panel: Recent Labs    01/04/24 0325 01/05/24 0342 01/06/24 0200 01/16/24 0705  NA 140 141 142 137  K 3.9 3.5 3.6 4.5  CL 110 109 107 102  CO2 22 22 25 26   GLUCOSE 129* 114* 117* 184*  BUN 31* 19 17 29*  CREATININE 1.31* 1.27* 1.29* 1.22*  CALCIUM 8.2* 8.2* 8.5* 8.8  MG 1.9 1.9 1.9  --   PHOS 3.5 2.9 2.6  --    Liver Function Tests: Recent Labs    03/29/23 1105 01/02/24 0726 01/04/24 0325 01/05/24 0342 01/06/24 0200  AST 21 20  --   --   --   ALT 22 15  --   --   --   ALKPHOS 85 108  --   --   --   BILITOT 0.4 0.7  --   --   --   PROT 6.6 6.3*  --   --   --   ALBUMIN 4.3 3.2* 2.9* 2.6* 2.7*   Recent Labs    03/29/23 1105  LIPASE 48.0   No results for input(s): AMMONIA in the last 8760 hours. CBC: Recent Labs    03/29/23 1105 08/28/23 1622 01/02/24 0726 01/02/24 1104 01/05/24 1517 01/06/24 0200 01/16/24 0705  WBC 9.7   < > 10.4   < > 10.2 9.9 7.7  NEUTROABS 7.1  --  7.9*  --  6.8  --   --   HGB 12.8   < > 11.6*   < > 8.2* 8.2*  9.6*  HCT 39.6   < > 35.9*   < > 24.7* 24.6* 29.9*  MCV 96.5   < > 90.0   < > 90.5 90.1 93.4  PLT 305.0   < > 288   < > 214 212 340   < > = values in this interval not displayed.   Lipid Panel: No results for input(s): CHOL, HDL, LDLCALC, TRIG, CHOLHDL, LDLDIRECT in the last 8760 hours. Lab Results  Component Value Date   HGBA1C 6.6 (H) 09/28/2023    Procedures since last visit: No results found.  Assessment/Plan 1. Chronic left-sided low back pain with left-sided sciatica (Primary)  - Ambulatory referral to Neurosurgery  We discussed various options At this time patient wants a referral to neurosurgery for a cortisone shot We will hold off any more  imaging. Continue physical therapy Start on prednisone  20 mg every day For 7 days Also called in Robaxin   take 250 mg twice daily. Continue Tylenol  PRN Avoid NSAIDS due to GI bleed history Labs/tests ordered:  * No order type specified * Next appt:  Visit date not found

## 2024-02-14 NOTE — Patient Instructions (Addendum)
 Take Muscle relaxant half pill twice a day as needed. Can take full pill at night if half Pill does not work I have made Referral to Neurosurgery for your Back pain Also Take Prednisone  with breakfast every day for 7 days  Labs will be done on Tuesday April 09, 2024 at 7:00 am at [facility]

## 2024-02-20 DIAGNOSIS — I70213 Atherosclerosis of native arteries of extremities with intermittent claudication, bilateral legs: Secondary | ICD-10-CM | POA: Diagnosis not present

## 2024-02-20 DIAGNOSIS — M5416 Radiculopathy, lumbar region: Secondary | ICD-10-CM | POA: Diagnosis not present

## 2024-02-20 DIAGNOSIS — M6281 Muscle weakness (generalized): Secondary | ICD-10-CM | POA: Diagnosis not present

## 2024-02-20 DIAGNOSIS — R2689 Other abnormalities of gait and mobility: Secondary | ICD-10-CM | POA: Diagnosis not present

## 2024-02-20 DIAGNOSIS — R2681 Unsteadiness on feet: Secondary | ICD-10-CM | POA: Diagnosis not present

## 2024-02-22 ENCOUNTER — Ambulatory Visit (HOSPITAL_COMMUNITY)
Admission: RE | Admit: 2024-02-22 | Discharge: 2024-02-22 | Disposition: A | Discharge status: Home / Self Care | Source: Ambulatory Visit | Attending: Physician Assistant | Admitting: Physician Assistant

## 2024-02-22 DIAGNOSIS — M79605 Pain in left leg: Secondary | ICD-10-CM | POA: Diagnosis not present

## 2024-02-22 DIAGNOSIS — M5416 Radiculopathy, lumbar region: Secondary | ICD-10-CM | POA: Diagnosis not present

## 2024-02-22 DIAGNOSIS — R2689 Other abnormalities of gait and mobility: Secondary | ICD-10-CM | POA: Diagnosis not present

## 2024-02-22 DIAGNOSIS — M79604 Pain in right leg: Secondary | ICD-10-CM | POA: Diagnosis not present

## 2024-02-22 DIAGNOSIS — I70213 Atherosclerosis of native arteries of extremities with intermittent claudication, bilateral legs: Secondary | ICD-10-CM | POA: Diagnosis not present

## 2024-02-23 ENCOUNTER — Ambulatory Visit: Payer: Self-pay

## 2024-02-23 LAB — VAS US ABI WITH/WO TBI
Left ABI: 1.11
Right ABI: 1.01

## 2024-02-23 NOTE — Telephone Encounter (Signed)
 FYI Only or Action Required?: FYI only for provider: appointment scheduled on 02/26/24.  Patient was last seen in primary care on 02/14/2024 by Charlanne Fredia CROME, MD.  Called Nurse Triage reporting Back Pain.  Symptoms began several weeks ago.  Interventions attempted: OTC medications: Acetaminophen , Rest, hydration, or home remedies, Ice/heat application, and Other: Physical Therapy.  Symptoms are: unchanged.  Triage Disposition: See HCP Within 4 Hours (Or PCP Triage)  Patient/caregiver understands and will follow disposition?: Yes  Reason for Disposition  [1] SEVERE back pain (e.g., excruciating, unable to do any normal activities) AND [2] not improved 2 hours after pain medicine  Answer Assessment - Initial Assessment Questions Patient called in stating she is experiencing lower back pain that radiates down her left leg. She is treated by [facility] Dr. Charlanne, but they informed her she would need to see PCP for further evaluation. She has been seeing PT 2x/week, taking Tylenol  2x/day, resting, and applying heat with mild relief from rest and heat. Patient states pain is 8-9/10 at it's worst and does not go away. She mentions that years ago she saw a doctor for lumbar pain that used to give her injections and sent her to PT and mentioned she was hoping to get a referral for similar care. Appt scheduled for 02/26/24. Advised patient to visit UC if symptoms worsen.   1. ONSET: When did the pain begin? (e.g., minutes, hours, days)     A few weeks ago  2. LOCATION: Where does it hurt? (upper, mid or lower back)     Lower back radiates down to left leg  3. SEVERITY: How bad is the pain?  (e.g., Scale 1-10; mild, moderate, or severe)     8-9/10  4. PATTERN: Is the pain constant? (e.g., yes, no; constant, intermittent)      Constant  5. RADIATION: Does the pain shoot into your legs or somewhere else?     Yes, left leg cramping  6. CAUSE:  What do you think is  causing the back pain?      I was in the hospital recently and had a surgery and I also did lift something heavy a little before this so I think my body might just be tired out.  7. BACK OVERUSE:  Any recent lifting of heavy objects, strenuous work or exercise?     Lifted something heavy a little before pain started  8. MEDICINES: What have you taken so far for the pain? (e.g., nothing, acetaminophen , NSAIDS)     Acetaminophen -taking every morning and night, cannot take NSAIDS  9. NEUROLOGIC SYMPTOMS: Do you have any weakness, numbness, or problems with bowel/bladder control?     Denies numbness/weakness, bowel/bladder problems  10. OTHER SYMPTOMS: Do you have any other symptoms? (e.g., fever, abdomen pain, burning with urination, blood in urine)       Denies any other symptoms  11. PREGNANCY: Is there any chance you are pregnant? When was your last menstrual period?       NA  Protocols used: Back Pain-A-AH  Copied from CRM [id number]. Topic: Clinical - Red Word Triage >> Feb 23, 2024  2:40 PM Chiquita SQUIBB wrote: Red Word that prompted transfer to Nurse Triage: Patient is calling in to stating that she is having a lot of back pain. Patient was supposed to be getting a referral but they wanted her to go to [place] and she can not do that. Patient states the pain is not getting any better.

## 2024-02-26 ENCOUNTER — Encounter: Payer: Self-pay | Admitting: Family Medicine

## 2024-02-26 ENCOUNTER — Ambulatory Visit: Admitting: Family Medicine

## 2024-02-26 VITALS — BP 138/64 | HR 106 | Temp 98.0°F | Ht 61.0 in | Wt 200.0 lb

## 2024-02-26 DIAGNOSIS — G8929 Other chronic pain: Secondary | ICD-10-CM | POA: Diagnosis not present

## 2024-02-26 DIAGNOSIS — M5442 Lumbago with sciatica, left side: Secondary | ICD-10-CM

## 2024-02-26 MED ORDER — TRAMADOL HCL 50 MG PO TABS: 50.0000 mg | ORAL_TABLET | Freq: Four times a day (QID) | ORAL | 0 refills | Status: AC | PRN

## 2024-02-26 NOTE — Progress Notes (Signed)
   Subjective:    Patient ID: Brittany Mcclure, female    DOB: 05-07-1940, 83 y.o.   MRN: [id number]  HPI Here asking for help and advice. She has a hx of low back pain, and on 05-02-08 she had a lybar MRI that showed severe central canal stenosis at L4-5. She saw Dr. Letha, who was with [facility] at the time, for some steroid injections. These helped for awhile. Lately the pain has been getting much worse, particularly when she gets out of bed in the mornings. She is fairly comfortable sitting or lying, but walking is difficult. She has been seeing Man Mast NP with [facility] for several years as her PCP, because she lives at [facility], and [facility] comes to the facility itself. She was recently referred back to [facility], but she was told the appt would be in [place], and she she says there is no way for her to get there. She still drives herself. She is currently taking Tylenol  and Methocarbamol , but these do not help much with the pain. She has also been in PT for 2 weeks, and she says this makes the pain worse. She cannot take NSAID's due a recent hx of GI bleeding. The back pain is worst on the left side and it radiates down the left leg.    Review of Systems  Constitutional: Negative.   Respiratory: Negative.    Cardiovascular: Negative.   Musculoskeletal:  Positive for back pain.       Objective:   Physical Exam Constitutional:      Comments: Walks with a cane   Cardiovascular:     Rate and Rhythm: Normal rate and regular rhythm.     Pulses: Normal pulses.     Heart sounds: Normal heart sounds.  Pulmonary:     Effort: Pulmonary effort is normal.     Breath sounds: Normal breath sounds.  Neurological:     Mental Status: She is alert.           Assessment & Plan:  Chronic low back pain with left sciatica. We will contact [facility] to get her an appt here in [place]. She wants to establish with a new PC, so she  will check with our other [facility] providers. She can use Tramadol  as needed for pain. .I personally spent a total of 32 minutes in the care of the patient today including getting/reviewing separately obtained history, performing a medically appropriate exam/evaluation, documenting clinical information in the EHR, and independently interpreting results.  Garnette Olmsted, MD

## 2024-02-27 ENCOUNTER — Encounter: Payer: Self-pay | Admitting: Nurse Practitioner

## 2024-02-27 ENCOUNTER — Ambulatory Visit: Payer: Self-pay

## 2024-02-27 DIAGNOSIS — K649 Unspecified hemorrhoids: Secondary | ICD-10-CM | POA: Insufficient documentation

## 2024-02-27 NOTE — Telephone Encounter (Signed)
 FYI Only or Action Required?: FYI only for provider: TOC scheduled for 2/17.  Patient was last seen in primary care on 02/26/2024 by Johnny Garnette LABOR, MD.  Called Nurse Triage reporting Back Pain.  Symptoms began several years ago.  Interventions attempted: Prescription medications: tramadol .  Symptoms are: unchanged.  Triage Disposition: See PCP Within 2 Weeks  Copied from CRM #[id number]. Topic: Clinical - Red Word Triage >> Feb 27, 2024  8:12 AM Montie POUR wrote: Red Word that prompted transfer to Nurse Triage:  She is hurting really bad in lower back area. Pain level is still a 10 . She came in yesterday for Chronic left-sided low back pain with left-sided sciatica.  traMADol  (ULTRAM ) 50 MG tablet is not helping with the pain. She wants to go back seeing Dr. Darleene Shape,. Reason for Disposition  Back pain is a chronic symptom (recurrent or ongoing AND present > 4 weeks)  Answer Assessment - Initial Assessment Questions 1. ONSET: When did the pain begin? (e.g., minutes, hours, days)     Chronic, ongoing 2. LOCATION: Where does it hurt? (upper, mid or lower back)     lower 3. SEVERITY: How bad is the pain?  (e.g., Scale 1-10; mild, moderate, or severe)     10 4. PATTERN: Is the pain constant? (e.g., yes, no; constant, intermittent)      chronic 5. RADIATION: Does the pain shoot into your legs or somewhere else?     L leg 6. CAUSE:  What do you think is causing the back pain?      sciatica 8. MEDICINES: What have you taken so far for the pain? (e.g., nothing, acetaminophen , NSAIDS)     Tramadol , not helping 9. NEUROLOGIC SYMPTOMS: Do you have any weakness, numbness, or problems with bowel/bladder control?     Pt states that putting weight on the leg causes more pain 10. OTHER SYMPTOMS: Do you have any other symptoms? (e.g., fever, abdomen pain, burning with urination, blood in urine)       Denies fevers, denies GU s/s  Pt states that she was calling to change  her phone number in her chart. Pt also asks about TOC appt for [facility], NP Cory, pt was sched for next available appt day. Pt states she is is also waiting for Dr Johnny to call her back from [facility] as she saw him yesterday and he stated he would [facility] as the current one is in [place].  Protocols used: Back Pain-A-AH

## 2024-02-28 ENCOUNTER — Telehealth: Payer: Self-pay | Admitting: Nurse Practitioner

## 2024-02-28 NOTE — Telephone Encounter (Signed)
 The patient stated she needs the referral to Neurosurgery be sent to [facility] in [place]. She has reached out to them directly, and it was not sent to them it was sent to [facility].  Please send referral to [facility] in [place].     Please advise.

## 2024-03-01 DIAGNOSIS — M5442 Lumbago with sciatica, left side: Secondary | ICD-10-CM | POA: Diagnosis not present

## 2024-03-03 ENCOUNTER — Ambulatory Visit: Payer: Self-pay | Admitting: Physician Assistant

## 2024-03-04 ENCOUNTER — Telehealth: Payer: Self-pay

## 2024-03-04 NOTE — Telephone Encounter (Unsigned)
 Copied from CRM [id number]. Topic: Referral - Status >> Mar 04, 2024  8:10 AM Brittany Mcclure wrote: Reason for CRM: Please call pt Brittany Mcclure regarding the status,  on her request for a [facility] or local neurosurgeon,  its been since February 14, 2024 and she has not heard from anyone  Please advise

## 2024-03-05 ENCOUNTER — Emergency Department (HOSPITAL_BASED_OUTPATIENT_CLINIC_OR_DEPARTMENT_OTHER)
Admission: EM | Admit: 2024-03-05 | Discharge: 2024-03-05 | Disposition: A | Discharge status: Home / Self Care | Source: Home / Self Care

## 2024-03-05 ENCOUNTER — Other Ambulatory Visit: Payer: Self-pay

## 2024-03-05 DIAGNOSIS — M5442 Lumbago with sciatica, left side: Secondary | ICD-10-CM | POA: Diagnosis not present

## 2024-03-05 DIAGNOSIS — E119 Type 2 diabetes mellitus without complications: Secondary | ICD-10-CM | POA: Diagnosis not present

## 2024-03-05 DIAGNOSIS — Z7982 Long term (current) use of aspirin: Secondary | ICD-10-CM | POA: Diagnosis not present

## 2024-03-05 DIAGNOSIS — Z79899 Other long term (current) drug therapy: Secondary | ICD-10-CM | POA: Diagnosis not present

## 2024-03-05 DIAGNOSIS — M545 Low back pain, unspecified: Secondary | ICD-10-CM | POA: Diagnosis present

## 2024-03-05 DIAGNOSIS — I10 Essential (primary) hypertension: Secondary | ICD-10-CM | POA: Diagnosis not present

## 2024-03-05 DIAGNOSIS — Z7984 Long term (current) use of oral hypoglycemic drugs: Secondary | ICD-10-CM | POA: Diagnosis not present

## 2024-03-05 MED ORDER — LIDOCAINE 5 % EX PTCH
1.0000 | MEDICATED_PATCH | CUTANEOUS | Status: DC
  Administered 2024-03-05: 11:00:00 1 via TRANSDERMAL
  Filled 2024-03-05: qty 1

## 2024-03-05 MED ORDER — KETOROLAC TROMETHAMINE 15 MG/ML IJ SOLN
15.0000 mg | Freq: Once | INTRAMUSCULAR | Status: AC
  Administered 2024-03-05: 11:00:00 15 mg via INTRAMUSCULAR
  Filled 2024-03-05: qty 1

## 2024-03-05 MED ORDER — PREDNISONE 20 MG PO TABS: ORAL_TABLET | ORAL | 0 refills | Status: AC

## 2024-03-05 MED ORDER — OXYCODONE-ACETAMINOPHEN 5-325 MG PO TABS: 1.0000 | ORAL_TABLET | Freq: Four times a day (QID) | ORAL | 0 refills | Status: AC | PRN

## 2024-03-05 MED ORDER — PREDNISONE 50 MG PO TABS
60.0000 mg | ORAL_TABLET | Freq: Once | ORAL | Status: AC
  Administered 2024-03-05: 11:00:00 60 mg via ORAL
  Filled 2024-03-05: qty 1

## 2024-03-05 NOTE — Discharge Instructions (Signed)
 As we discussed, I would like for you to follow up with orthopedics for further evaluation. Please call and schedule and appointment. Please take your medications as prescribed. Return to the ED for any worsening symptoms.

## 2024-03-05 NOTE — ED Provider Notes (Signed)
 [place]MENT AT [facility] Provider Note   CSN: [id number] Arrival date & time: 03/05/24  9082     Patient presents with: Hip Pain and Back Pain   Brittany Mcclure is a 83 y.o. female patient with history of type 2 diabetes, hypertension, hyperlipidemia who presents to the emergency department today for further evaluation of lower back pain with radiation down the left leg.  She states that she has been suffering for this for a long time.  She was formally evaluated with this 15 years ago.  I can see a MRI lumbar spine which does show mild disc protrusion at L1-L2 and there was some severe multifactorial central stenosis at L4-L5.  This was back in 2010.  Patient presenting today with radicular type pain down the left leg.  She denies any bowel or bladder incontinence.  She denies any drug use or alcohol use, fever, chills.  Also denies any focal weakness or numbness to the lower extremities.  She does state that she is having trouble walking secondary to pain. Uses cane at baseline.     Hip Pain  Back Pain      Prior to Admission medications  Medication Sig Start Date End Date Taking? Authorizing Provider  oxyCODONE -acetaminophen  (PERCOCET/ROXICET) 5-325 MG tablet Take 1 tablet by mouth every 6 (six) hours as needed for severe pain (pain score 7-10). 03/05/24  Yes Theotis, Vada Yellen M, PA-C  predniSONE  (DELTASONE ) 20 MG tablet Take 2 tablets (40 mg total) by mouth daily for 2 days, THEN 1 tablet (20 mg total) daily for 2 days. 03/05/24 03/09/24 Yes Antwione Picotte M, PA-C  Accu-Chek FastClix Lancets MISC USE AS DIRECTED. 04/18/22   Mast, Man X, NP  acetaminophen  (TYLENOL ) 650 MG CR tablet Take 1 tablet (650 mg total) by mouth every 8 (eight) hours as needed for pain. 01/06/24   Gonfa, Taye T, MD  acetaminophen  (TYLENOL ) 650 MG CR tablet Take 650 mg by mouth daily. Every morning    [provider]  ALPRAZolam  (XANAX ) 0.25 MG tablet TAKE ONE TABLET BY  MOUTH TWICE DAILY AS NEEDED 01/08/24   Fargo, Amy E, NP  amLODipine  (NORVASC ) 10 MG tablet Take 1 tablet (10 mg total) by mouth daily. 01/06/24   Gonfa, Taye T, MD  aspirin  EC 81 MG tablet Take 1 tablet (81 mg total) by mouth daily. Swallow whole. 08/28/23   Chandrasekhar, Stanly LABOR, MD  Cholecalciferol  (VITAMIN D3) 125 MCG (5000 UT) TABS Take 5,000 Units by mouth daily.    [provider]  esomeprazole  (NEXIUM ) 40 MG capsule Take 1 capsule (40 mg total) by mouth daily. 11/02/23   [place], PA-C  FLUoxetine  (PROZAC ) 40 MG capsule TAKE ONE CAPSULE BY MOUTH EVERY DAY. 10/30/23   Mast, Man X, NP  glipiZIDE  (GLUCOTROL ) 5 MG tablet Take 1 tablet (5 mg total) by mouth 2 (two) times daily before a meal. 10/30/23   Mast, Man X, NP  glucose blood (ACCU-CHEK SMARTVIEW) test strip CHECK BLOOD SUGAR 3 TIMES DAILY AS DIRECTED. 04/18/22   Mast, Man X, NP  Glycerin-Hypromellose-PEG 400 (DRY EYE RELIEF DROPS OP) Place 1 drop into both eyes daily as needed (Dry eye).    [provider]  methocarbamol  (ROBAXIN ) 500 MG tablet Take 0.5 tablets (250 mg total) by mouth 3 (three) times daily as needed for muscle spasms. 02/14/24   Gupta, Anjali L, MD  Multiple Vitamins-Minerals (PRESERVISION AREDS PO) Take 1 tablet by mouth in the morning and at bedtime.  [provider]  Omega-3 Fatty Acids (FISH OIL) 1000 MG CAPS Take 1,000 mg by mouth daily.    [provider]  sucralfate  (CARAFATE ) 1 g tablet Take 1 tablet (1 g total) by mouth 3 (three) times daily as needed. 11/02/23 04/30/24  [place], PA-C  telmisartan  (MICARDIS ) 20 MG tablet Take 1 tablet (20 mg total) by mouth daily. 02/13/24   Duke, Jon Garre, PA  traMADol  (ULTRAM ) 50 MG tablet Take 1 tablet (50 mg total) by mouth every 6 (six) hours as needed for severe pain (pain score 7-10). 02/26/24   Johnny Garnette LABOR, MD  triamcinolone  cream (KENALOG ) 0.1 % Apply 1 Application topically 2 (two) times daily. Apply 2 times daily to the  back for up to 2 weeks then STOP, Resume in 2 weeks if irritation is not resolved 06/13/23   Alm Delon SAILOR, DO    Allergies: Statins, Sulfa antibiotics, and Zetia  [ezetimibe ]    Review of Systems  Musculoskeletal:  Positive for back pain.  All other systems reviewed and are negative.   Updated Vital Signs BP (!) 170/69 (BP Location: Right Arm)   Pulse 96   Temp 98.4 F (36.9 C) (Oral)   Resp 19   SpO2 95%   Physical Exam Vitals and nursing note reviewed.  Constitutional:      Appearance: Normal appearance.  HENT:     Head: Normocephalic and atraumatic.  Eyes:     General:        Right eye: No discharge.        Left eye: No discharge.     Conjunctiva/sclera: Conjunctivae normal.  Pulmonary:     Effort: Pulmonary effort is normal.  Musculoskeletal:     Comments: 5/5 strength of the lower extremities.  Normal sensation to lower extremities.  There is some point tenderness to the lower lumbar spine IN and off to the left paralumbar musculature.  Skin:    General: Skin is warm and dry.     Findings: No rash.  Neurological:     General: No focal deficit present.     Mental Status: She is alert.  Psychiatric:        Mood and Affect: Mood normal.        Behavior: Behavior normal.     (all labs ordered are listed, but only abnormal results are displayed) Labs Reviewed - No data to display  EKG: None  Radiology: No results found.   Procedures   Medications Ordered in the ED  lidocaine  (LIDODERM ) 5 % 1 patch (1 patch Transdermal Patch Applied 03/05/24 1113)  ketorolac  (TORADOL ) 15 MG/ML injection 15 mg (15 mg Intramuscular Given 03/05/24 1113)  predniSONE  (DELTASONE ) tablet 60 mg (60 mg Oral Given 03/05/24 1113)    Clinical Course as of 03/05/24 1207  Tue Mar 05, 2024  1019 Patient is requesting an MRI here today.  Unfortunately, we do not have MRI available at this location today.  I do not know that an MRI is warranted at this time as I do not see any  obvious red flags on physical examination or with overall history. [CF]  1203 On repeat evaluation, patient's pain is feeling better.  We went over etiologies of her pain and we discussed her MRI from 2010.  Will have her follow-up with orthopedist.  I will give her a very short course of steroids.  We discussed monitoring blood sugar with this can raise it given her history of diabetes, have also given her Percocet.  She will take Tylenol  and she has lidocaine  patches at home.  We discussed red flag symptoms.  All questions or concerns addressed. [CF]    Clinical Course User Index [CF] Theotis Cameron HERO, PA-C    Medical Decision Making GIGI ONSTAD is a 83 y.o. female patient who presents to the emergency department today for further evaluation of lower back pain.  Seems to be consistent with sciatica type pain.  Low suspicion for cauda equina or epidural abscess.  Low suspicion for any other vascular compromise.  Will trial her on some steroids and Toradol  and lidocaine  patch.  As highlighted in the ED course, patient is feeling better.  Will give her short course of steroids to go home with and Percocet for breakthrough pain.  She has Tylenol  and lidocaine  patches. Strict return precautions given. She is safe for discharge.    Risk Prescription drug management.     Final diagnoses:  Acute midline low back pain with left-sided sciatica    ED Discharge Orders          Ordered    oxyCODONE -acetaminophen  (PERCOCET/ROXICET) 5-325 MG tablet  Every 6 hours PRN        03/05/24 1159    predniSONE  (DELTASONE ) 20 MG tablet  Daily        03/05/24 1159               Theotis Cameron [place], [place] 03/05/24 1207    Neysa Caron PARAS, DO 03/05/24 1427

## 2024-03-05 NOTE — ED Triage Notes (Signed)
 C/o left hip pain that rads down into left leg. Hx of sciatica problems. Denies recent injuries.

## 2024-03-07 NOTE — Telephone Encounter (Signed)
 03/05/24: CNS contacted patient:  Patient wanted same day appointment and when told they were unable to accommodate, she declined. Several phone calls have been documented. I am sorry she does not recall anyone contacting her regarding scheduling with neurosurgery. Please provide my direct number if she calls again, as I have been unsuccessful in reaching her.

## 2024-03-07 NOTE — Telephone Encounter (Signed)
 Called patient but she did not answer and recording says she has a voicemail box that has not been set up yet. You may give her my direct number [phone] when she calls back.

## 2024-03-07 NOTE — Telephone Encounter (Signed)
 Referral was sent to [facility] in [place]. They referred her to [facility] location due to diagnoses; patient was agreeable. However, when [facility] called her, she declined to schedule since their office was not able to accommodate a same day appointment. - Resolve: Redirect Neurosurgery referral to: [facility] Please notify patient if she calls back. I will attempt to reach her again by phone.

## 2024-04-11 ENCOUNTER — Encounter: Admitting: Nurse Practitioner

## 2024-04-11 ENCOUNTER — Encounter: Payer: Self-pay | Admitting: Nurse Practitioner

## 2024-04-11 VITALS — Ht 61.0 in

## 2024-04-11 NOTE — Progress Notes (Signed)
 This encounter was created in error - please disregard.

## 2024-04-11 NOTE — Assessment & Plan Note (Signed)
Blood pressure is controlled,  takes Beets capsule,  Ziac 10/6.25mg qd.    

## 2024-04-11 NOTE — Assessment & Plan Note (Signed)
No acute gout flare ups, uric acid 4.9 01/01/19, on Allopurinol

## 2024-04-11 NOTE — Assessment & Plan Note (Signed)
 cervical and lumbar spine: s/p injs, pain is left lower back, Vit D 37 11/30/21, followed by Neurosurgery, treated with Prednisone  and Robaxin  03/05/24 ED L lower back pain, 03/05/24 [facility] lumbar radiculopathy

## 2024-04-11 NOTE — Assessment & Plan Note (Signed)
 off Metformin -bloated, failed Ozempic -GI symptoms, on Glipizide , Hgb A1c 6.6 09/28/23

## 2024-04-11 NOTE — Assessment & Plan Note (Signed)
"   takes Beets capsule,  Ziac  10/6.25mg  qd.  "

## 2024-04-11 NOTE — Assessment & Plan Note (Signed)
 stable on Nexium , followed by GI

## 2024-04-11 NOTE — Assessment & Plan Note (Signed)
 on Alprazolam   prn, on Prozac  CT 06/23/21 unremarkable.

## 2024-04-17 ENCOUNTER — Other Ambulatory Visit: Payer: Self-pay | Admitting: Nurse Practitioner

## 2024-04-17 ENCOUNTER — Other Ambulatory Visit: Payer: Self-pay | Admitting: Orthopedic Surgery

## 2024-04-17 NOTE — Telephone Encounter (Signed)
 Patient has request for refill on 04/17/24. Patient last refill was 01/08/24. Patient has no contract on file. Patient has no upcoming appointment  Medication pend and sent to PCP (Mast, Man X, NP) for approval.

## 2024-05-07 ENCOUNTER — Encounter: Admitting: Adult Health
